# Patient Record
Sex: Male | Born: 1935 | Race: Black or African American | Hispanic: No | Marital: Married | Smoking: Former smoker
Health system: Southern US, Community
[De-identification: ages and names within clinical notes are randomized; demographics above are authoritative.]

## PROBLEM LIST (undated history)

## (undated) ENCOUNTER — Emergency Department (HOSPITAL_COMMUNITY): Payer: Medicare HMO

## (undated) DIAGNOSIS — D649 Anemia, unspecified: Secondary | ICD-10-CM

## (undated) DIAGNOSIS — M1990 Unspecified osteoarthritis, unspecified site: Secondary | ICD-10-CM

## (undated) DIAGNOSIS — Z860100 Personal history of colon polyps, unspecified: Secondary | ICD-10-CM

## (undated) DIAGNOSIS — I10 Essential (primary) hypertension: Secondary | ICD-10-CM

## (undated) DIAGNOSIS — L720 Epidermal cyst: Secondary | ICD-10-CM

## (undated) DIAGNOSIS — N400 Enlarged prostate without lower urinary tract symptoms: Secondary | ICD-10-CM

## (undated) DIAGNOSIS — G4730 Sleep apnea, unspecified: Secondary | ICD-10-CM

## (undated) DIAGNOSIS — J449 Chronic obstructive pulmonary disease, unspecified: Secondary | ICD-10-CM

## (undated) DIAGNOSIS — E785 Hyperlipidemia, unspecified: Secondary | ICD-10-CM

## (undated) DIAGNOSIS — J8410 Pulmonary fibrosis, unspecified: Secondary | ICD-10-CM

## (undated) DIAGNOSIS — Z87442 Personal history of urinary calculi: Secondary | ICD-10-CM

## (undated) DIAGNOSIS — N289 Disorder of kidney and ureter, unspecified: Secondary | ICD-10-CM

## (undated) HISTORY — DX: Hyperlipidemia, unspecified: E78.5

## (undated) HISTORY — DX: Sleep apnea, unspecified: G47.30

## (undated) HISTORY — DX: Unspecified osteoarthritis, unspecified site: M19.90

## (undated) HISTORY — DX: Pulmonary fibrosis, unspecified: J84.10

## (undated) HISTORY — DX: Personal history of colonic polyps: Z86.010

## (undated) HISTORY — PX: TOE SURGERY: SHX1073

## (undated) HISTORY — DX: Personal history of colon polyps, unspecified: Z86.0100

## (undated) HISTORY — DX: Anemia, unspecified: D64.9

## (undated) HISTORY — DX: Benign prostatic hyperplasia without lower urinary tract symptoms: N40.0

## (undated) HISTORY — DX: Chronic obstructive pulmonary disease, unspecified: J44.9

## (undated) HISTORY — DX: Disorder of kidney and ureter, unspecified: N28.9

## (undated) HISTORY — DX: Epidermal cyst: L72.0

## (undated) HISTORY — DX: Essential (primary) hypertension: I10

---

## 1999-04-16 ENCOUNTER — Encounter: Admission: RE | Admit: 1999-04-16 | Discharge: 1999-04-16 | Payer: Self-pay | Admitting: Family Medicine

## 1999-04-16 ENCOUNTER — Encounter: Payer: Self-pay | Admitting: Family Medicine

## 1999-05-15 ENCOUNTER — Ambulatory Visit (HOSPITAL_COMMUNITY): Admission: RE | Admit: 1999-05-15 | Discharge: 1999-05-15 | Payer: Self-pay | Admitting: *Deleted

## 2000-05-18 ENCOUNTER — Encounter: Admission: RE | Admit: 2000-05-18 | Discharge: 2000-05-18 | Payer: Self-pay | Admitting: Family Medicine

## 2000-05-18 ENCOUNTER — Encounter: Payer: Self-pay | Admitting: Family Medicine

## 2001-11-24 ENCOUNTER — Encounter: Admission: RE | Admit: 2001-11-24 | Discharge: 2002-02-22 | Payer: Self-pay | Admitting: Family Medicine

## 2002-02-02 ENCOUNTER — Encounter: Admission: RE | Admit: 2002-02-02 | Discharge: 2002-05-03 | Payer: Self-pay | Admitting: Family Medicine

## 2002-10-07 ENCOUNTER — Encounter: Admission: RE | Admit: 2002-10-07 | Discharge: 2002-10-07 | Payer: Self-pay | Admitting: Family Medicine

## 2002-10-07 ENCOUNTER — Encounter: Payer: Self-pay | Admitting: Family Medicine

## 2002-12-08 ENCOUNTER — Encounter: Admission: RE | Admit: 2002-12-08 | Discharge: 2002-12-08 | Payer: Self-pay | Admitting: Neurosurgery

## 2003-08-04 ENCOUNTER — Ambulatory Visit (HOSPITAL_COMMUNITY): Admission: RE | Admit: 2003-08-04 | Discharge: 2003-08-04 | Payer: Self-pay | Admitting: Gastroenterology

## 2003-09-13 ENCOUNTER — Encounter (INDEPENDENT_AMBULATORY_CARE_PROVIDER_SITE_OTHER): Payer: Self-pay | Admitting: *Deleted

## 2003-09-13 ENCOUNTER — Ambulatory Visit (HOSPITAL_COMMUNITY): Admission: RE | Admit: 2003-09-13 | Discharge: 2003-09-13 | Payer: Self-pay | Admitting: Gastroenterology

## 2004-05-10 ENCOUNTER — Encounter: Admission: RE | Admit: 2004-05-10 | Discharge: 2004-05-10 | Payer: Self-pay | Admitting: Family Medicine

## 2004-06-12 ENCOUNTER — Ambulatory Visit (HOSPITAL_COMMUNITY): Admission: RE | Admit: 2004-06-12 | Discharge: 2004-06-12 | Payer: Self-pay | Admitting: *Deleted

## 2005-10-31 ENCOUNTER — Ambulatory Visit: Payer: Self-pay | Admitting: Family Medicine

## 2005-12-29 ENCOUNTER — Ambulatory Visit: Payer: Self-pay | Admitting: Family Medicine

## 2006-02-02 ENCOUNTER — Ambulatory Visit: Payer: Self-pay | Admitting: Family Medicine

## 2006-02-02 LAB — CONVERTED CEMR LAB
BUN: 15 mg/dL (ref 6–23)
CO2: 29 meq/L (ref 19–32)
Calcium: 9.6 mg/dL (ref 8.4–10.5)
Chloride: 104 meq/L (ref 96–112)
Creatinine, Ser: 1.3 mg/dL (ref 0.4–1.5)
GFR calc non Af Amer: 58 mL/min
Glomerular Filtration Rate, Af Am: 70 mL/min/{1.73_m2}
Glucose, Bld: 135 mg/dL — ABNORMAL HIGH (ref 70–99)
Potassium: 3.7 meq/L (ref 3.5–5.1)
Sodium: 141 meq/L (ref 135–145)

## 2006-03-03 ENCOUNTER — Ambulatory Visit: Payer: Self-pay | Admitting: Family Medicine

## 2006-03-03 LAB — CONVERTED CEMR LAB
ALT: 32 units/L (ref 0–40)
AST: 33 units/L (ref 0–37)
BUN: 13 mg/dL (ref 6–23)
CO2: 30 meq/L (ref 19–32)
Calcium: 9.8 mg/dL (ref 8.4–10.5)
Chloride: 102 meq/L (ref 96–112)
Chol/HDL Ratio, serum: 4.3
Cholesterol: 165 mg/dL (ref 0–200)
Creatinine, Ser: 1.3 mg/dL (ref 0.4–1.5)
Creatinine,U: 101.4 mg/dL
GFR calc non Af Amer: 58 mL/min
Glomerular Filtration Rate, Af Am: 70 mL/min/{1.73_m2}
Glucose, Bld: 151 mg/dL — ABNORMAL HIGH (ref 70–99)
HDL: 38.6 mg/dL — ABNORMAL LOW (ref >39.0)
Hgb A1c MFr Bld: 8.5 % — ABNORMAL HIGH (ref 4.6–6.0)
LDL Cholesterol: 102 mg/dL — ABNORMAL HIGH (ref 0–99)
Microalb Creat Ratio: 45.4 mg/g — ABNORMAL HIGH (ref 0.0–30.0)
Microalb, Ur: 4.6 mg/dL — ABNORMAL HIGH (ref 0.0–1.9)
Potassium: 4.3 meq/L (ref 3.5–5.1)
Sodium: 140 meq/L (ref 135–145)
Triglyceride fasting, serum: 122 mg/dL (ref 0–149)
VLDL: 24 mg/dL (ref 0–40)

## 2006-04-29 DIAGNOSIS — J439 Emphysema, unspecified: Secondary | ICD-10-CM | POA: Insufficient documentation

## 2006-04-29 DIAGNOSIS — G4730 Sleep apnea, unspecified: Secondary | ICD-10-CM | POA: Insufficient documentation

## 2006-04-29 DIAGNOSIS — I10 Essential (primary) hypertension: Secondary | ICD-10-CM | POA: Insufficient documentation

## 2006-04-29 DIAGNOSIS — E119 Type 2 diabetes mellitus without complications: Secondary | ICD-10-CM | POA: Insufficient documentation

## 2006-04-29 DIAGNOSIS — E1159 Type 2 diabetes mellitus with other circulatory complications: Secondary | ICD-10-CM | POA: Insufficient documentation

## 2006-04-29 DIAGNOSIS — M109 Gout, unspecified: Secondary | ICD-10-CM | POA: Insufficient documentation

## 2006-06-03 ENCOUNTER — Ambulatory Visit: Payer: Self-pay | Admitting: Family Medicine

## 2006-06-03 LAB — CONVERTED CEMR LAB
ALT: 31 units/L (ref 0–40)
AST: 30 units/L (ref 0–37)
Albumin: 4.1 g/dL (ref 3.5–5.2)
Alkaline Phosphatase: 62 units/L (ref 39–117)
BUN: 13 mg/dL (ref 6–23)
Bilirubin, Direct: 0.1 mg/dL (ref 0.0–0.3)
CO2: 29 meq/L (ref 19–32)
Calcium: 9.7 mg/dL (ref 8.4–10.5)
Chloride: 104 meq/L (ref 96–112)
Cholesterol: 251 mg/dL (ref 0–200)
Creatinine, Ser: 1.2 mg/dL (ref 0.4–1.5)
Direct LDL: 180.5 mg/dL
GFR calc Af Amer: 77 mL/min
GFR calc non Af Amer: 64 mL/min
Glucose, Bld: 151 mg/dL — ABNORMAL HIGH (ref 70–99)
HDL: 40.6 mg/dL (ref >39.0)
Potassium: 3.9 meq/L (ref 3.5–5.1)
Sodium: 141 meq/L (ref 135–145)
Total Bilirubin: 1 mg/dL (ref 0.3–1.2)
Total CHOL/HDL Ratio: 6.2
Total Protein: 6.9 g/dL (ref 6.0–8.3)
Triglycerides: 240 mg/dL (ref 0–149)
VLDL: 48 mg/dL — ABNORMAL HIGH (ref 0–40)

## 2006-07-28 ENCOUNTER — Ambulatory Visit: Payer: Self-pay | Admitting: Family Medicine

## 2006-07-28 LAB — CONVERTED CEMR LAB
ALT: 32 units/L (ref 0–40)
AST: 30 units/L (ref 0–37)
BUN: 12 mg/dL (ref 6–23)
CO2: 31 meq/L (ref 19–32)
Calcium: 9.6 mg/dL (ref 8.4–10.5)
Chloride: 103 meq/L (ref 96–112)
Cholesterol: 136 mg/dL (ref 0–200)
Creatinine, Ser: 1 mg/dL (ref 0.4–1.5)
GFR calc Af Amer: 95 mL/min
GFR calc non Af Amer: 79 mL/min
Glucose, Bld: 220 mg/dL — ABNORMAL HIGH (ref 70–99)
HDL: 35.6 mg/dL — ABNORMAL LOW (ref >39.0)
LDL Cholesterol: 71 mg/dL (ref 0–99)
Potassium: 4.2 meq/L (ref 3.5–5.1)
Sodium: 139 meq/L (ref 135–145)
Total CHOL/HDL Ratio: 3.8
Triglycerides: 145 mg/dL (ref 0–149)
VLDL: 29 mg/dL (ref 0–40)

## 2006-09-30 ENCOUNTER — Telehealth (INDEPENDENT_AMBULATORY_CARE_PROVIDER_SITE_OTHER): Payer: Self-pay | Admitting: *Deleted

## 2006-10-06 ENCOUNTER — Telehealth (INDEPENDENT_AMBULATORY_CARE_PROVIDER_SITE_OTHER): Payer: Self-pay | Admitting: *Deleted

## 2006-10-06 ENCOUNTER — Ambulatory Visit: Payer: Self-pay | Admitting: Family Medicine

## 2006-10-06 DIAGNOSIS — E1169 Type 2 diabetes mellitus with other specified complication: Secondary | ICD-10-CM | POA: Insufficient documentation

## 2006-10-06 DIAGNOSIS — E785 Hyperlipidemia, unspecified: Secondary | ICD-10-CM | POA: Insufficient documentation

## 2006-10-06 LAB — CONVERTED CEMR LAB
Bilirubin Urine: NEGATIVE
Blood in Urine, dipstick: NEGATIVE
Glucose, Urine, Semiquant: >=1000
Ketones, urine, test strip: NEGATIVE
Nitrite: NEGATIVE
Protein, U semiquant: NEGATIVE
Specific Gravity, Urine: 1.015
Urobilinogen, UA: 0.2
WBC Urine, dipstick: NEGATIVE
pH: 5

## 2006-10-07 LAB — CONVERTED CEMR LAB
ALT: 26 units/L (ref 0–53)
AST: 28 units/L (ref 0–37)
BUN: 19 mg/dL (ref 6–23)
CO2: 30 meq/L (ref 19–32)
Calcium: 9.6 mg/dL (ref 8.4–10.5)
Chloride: 107 meq/L (ref 96–112)
Cholesterol: 106 mg/dL (ref 0–200)
Creatinine, Ser: 1.2 mg/dL (ref 0.4–1.5)
GFR calc Af Amer: 77 mL/min
GFR calc non Af Amer: 64 mL/min
Glucose, Bld: 342 mg/dL — ABNORMAL HIGH (ref 70–99)
HDL: 28.9 mg/dL — ABNORMAL LOW (ref >39.0)
Hgb A1c MFr Bld: 15 % — ABNORMAL HIGH (ref 4.6–6.0)
LDL Cholesterol: 43 mg/dL (ref 0–99)
Potassium: 4.1 meq/L (ref 3.5–5.1)
Sodium: 138 meq/L (ref 135–145)
Total CHOL/HDL Ratio: 3.7
Triglycerides: 173 mg/dL — ABNORMAL HIGH (ref 0–149)
VLDL: 35 mg/dL (ref 0–40)

## 2006-10-15 ENCOUNTER — Encounter (INDEPENDENT_AMBULATORY_CARE_PROVIDER_SITE_OTHER): Payer: Self-pay | Admitting: Family Medicine

## 2006-10-16 ENCOUNTER — Telehealth (INDEPENDENT_AMBULATORY_CARE_PROVIDER_SITE_OTHER): Payer: Self-pay | Admitting: *Deleted

## 2006-10-23 ENCOUNTER — Encounter (INDEPENDENT_AMBULATORY_CARE_PROVIDER_SITE_OTHER): Payer: Self-pay | Admitting: Family Medicine

## 2006-11-27 ENCOUNTER — Ambulatory Visit: Payer: Self-pay | Admitting: Family Medicine

## 2006-11-29 LAB — CONVERTED CEMR LAB
BUN: 11 mg/dL (ref 6–23)
CO2: 31 meq/L (ref 19–32)
Calcium: 9.7 mg/dL (ref 8.4–10.5)
Chloride: 101 meq/L (ref 96–112)
Creatinine, Ser: 1.1 mg/dL (ref 0.4–1.5)
GFR calc Af Amer: 85 mL/min
GFR calc non Af Amer: 70 mL/min
Glucose, Bld: 125 mg/dL — ABNORMAL HIGH (ref 70–99)
PSA: 0.62 ng/mL (ref 0.10–4.00)
Potassium: 4.1 meq/L (ref 3.5–5.1)
Sodium: 139 meq/L (ref 135–145)

## 2006-11-30 ENCOUNTER — Telehealth (INDEPENDENT_AMBULATORY_CARE_PROVIDER_SITE_OTHER): Payer: Self-pay | Admitting: *Deleted

## 2006-12-17 ENCOUNTER — Encounter (INDEPENDENT_AMBULATORY_CARE_PROVIDER_SITE_OTHER): Payer: Self-pay | Admitting: Family Medicine

## 2007-01-13 ENCOUNTER — Ambulatory Visit: Payer: Self-pay | Admitting: Family Medicine

## 2007-01-14 ENCOUNTER — Encounter (INDEPENDENT_AMBULATORY_CARE_PROVIDER_SITE_OTHER): Payer: Self-pay | Admitting: *Deleted

## 2007-01-14 LAB — CONVERTED CEMR LAB
BUN: 10 mg/dL (ref 6–23)
CO2: 31 meq/L (ref 19–32)
Calcium: 9.7 mg/dL (ref 8.4–10.5)
Chloride: 104 meq/L (ref 96–112)
Creatinine, Ser: 1.3 mg/dL (ref 0.4–1.5)
GFR calc Af Amer: 70 mL/min
GFR calc non Af Amer: 58 mL/min
Glucose, Bld: 112 mg/dL — ABNORMAL HIGH (ref 70–99)
Potassium: 4.5 meq/L (ref 3.5–5.1)
Sodium: 142 meq/L (ref 135–145)

## 2007-02-17 ENCOUNTER — Ambulatory Visit: Payer: Self-pay | Admitting: Family Medicine

## 2007-02-19 ENCOUNTER — Telehealth (INDEPENDENT_AMBULATORY_CARE_PROVIDER_SITE_OTHER): Payer: Self-pay | Admitting: *Deleted

## 2007-02-19 ENCOUNTER — Encounter (INDEPENDENT_AMBULATORY_CARE_PROVIDER_SITE_OTHER): Payer: Self-pay | Admitting: *Deleted

## 2007-02-19 LAB — CONVERTED CEMR LAB
BUN: 14 mg/dL (ref 6–23)
CO2: 32 meq/L (ref 19–32)
Calcium: 9.9 mg/dL (ref 8.4–10.5)
Chloride: 103 meq/L (ref 96–112)
Creatinine, Ser: 1.3 mg/dL (ref 0.4–1.5)
GFR calc Af Amer: 70 mL/min
GFR calc non Af Amer: 58 mL/min
Glucose, Bld: 138 mg/dL — ABNORMAL HIGH (ref 70–99)
Potassium: 4.2 meq/L (ref 3.5–5.1)
Sodium: 141 meq/L (ref 135–145)

## 2007-04-21 ENCOUNTER — Encounter: Payer: Self-pay | Admitting: Internal Medicine

## 2007-05-12 ENCOUNTER — Telehealth (INDEPENDENT_AMBULATORY_CARE_PROVIDER_SITE_OTHER): Payer: Self-pay | Admitting: *Deleted

## 2007-06-01 ENCOUNTER — Telehealth (INDEPENDENT_AMBULATORY_CARE_PROVIDER_SITE_OTHER): Payer: Self-pay | Admitting: *Deleted

## 2007-06-04 ENCOUNTER — Telehealth (INDEPENDENT_AMBULATORY_CARE_PROVIDER_SITE_OTHER): Payer: Self-pay | Admitting: *Deleted

## 2007-06-04 ENCOUNTER — Encounter (INDEPENDENT_AMBULATORY_CARE_PROVIDER_SITE_OTHER): Payer: Self-pay | Admitting: Family Medicine

## 2007-06-30 ENCOUNTER — Ambulatory Visit: Payer: Self-pay | Admitting: Internal Medicine

## 2007-09-01 ENCOUNTER — Ambulatory Visit: Payer: Self-pay | Admitting: Internal Medicine

## 2007-09-13 ENCOUNTER — Telehealth (INDEPENDENT_AMBULATORY_CARE_PROVIDER_SITE_OTHER): Payer: Self-pay | Admitting: *Deleted

## 2007-10-11 ENCOUNTER — Telehealth: Payer: Self-pay | Admitting: Internal Medicine

## 2007-12-23 ENCOUNTER — Encounter: Payer: Self-pay | Admitting: Internal Medicine

## 2007-12-23 LAB — CONVERTED CEMR LAB
ALT: 22 units/L
BUN: 21 mg/dL
CO2, serum: 27 mmol/L
Calcium: 9.7 mg/dL
Chloride, Serum: 104 mmol/L
Creatinine, Ser: 1.4 mg/dL
Glucose, Bld: 135 mg/dL
Potassium, serum: 5.2 mmol/L
Sodium, serum: 138 mmol/L

## 2007-12-28 ENCOUNTER — Encounter: Payer: Self-pay | Admitting: Internal Medicine

## 2008-01-18 ENCOUNTER — Ambulatory Visit: Payer: Self-pay | Admitting: Internal Medicine

## 2008-02-07 ENCOUNTER — Telehealth: Payer: Self-pay | Admitting: Internal Medicine

## 2008-02-15 ENCOUNTER — Ambulatory Visit: Payer: Self-pay | Admitting: Internal Medicine

## 2008-02-17 ENCOUNTER — Ambulatory Visit: Payer: Self-pay | Admitting: Internal Medicine

## 2008-02-17 LAB — CONVERTED CEMR LAB
BUN: 23 mg/dL (ref 6–23)
CO2: 26 meq/L (ref 19–32)
Calcium: 9.4 mg/dL (ref 8.4–10.5)
Chloride: 107 meq/L (ref 96–112)
Creatinine, Ser: 1.6 mg/dL — ABNORMAL HIGH (ref 0.4–1.5)
GFR calc Af Amer: 55 mL/min
GFR calc non Af Amer: 46 mL/min
Glucose, Bld: 98 mg/dL (ref 70–99)
Potassium: 4.2 meq/L (ref 3.5–5.1)
Sodium: 141 meq/L (ref 135–145)

## 2008-02-18 ENCOUNTER — Ambulatory Visit: Payer: Self-pay | Admitting: Internal Medicine

## 2008-02-22 ENCOUNTER — Telehealth (INDEPENDENT_AMBULATORY_CARE_PROVIDER_SITE_OTHER): Payer: Self-pay | Admitting: *Deleted

## 2008-03-09 ENCOUNTER — Ambulatory Visit: Payer: Self-pay | Admitting: Internal Medicine

## 2008-03-09 DIAGNOSIS — K3189 Other diseases of stomach and duodenum: Secondary | ICD-10-CM | POA: Insufficient documentation

## 2008-03-09 DIAGNOSIS — R1013 Epigastric pain: Secondary | ICD-10-CM | POA: Insufficient documentation

## 2008-03-09 DIAGNOSIS — K5900 Constipation, unspecified: Secondary | ICD-10-CM | POA: Insufficient documentation

## 2008-03-15 ENCOUNTER — Telehealth (INDEPENDENT_AMBULATORY_CARE_PROVIDER_SITE_OTHER): Payer: Self-pay | Admitting: *Deleted

## 2008-03-15 LAB — CONVERTED CEMR LAB
ALT: 57 units/L — ABNORMAL HIGH (ref 0–53)
AST: 62 units/L — ABNORMAL HIGH (ref 0–37)
Albumin: 3.7 g/dL (ref 3.5–5.2)
Alkaline Phosphatase: 76 units/L (ref 39–117)
BUN: 12 mg/dL (ref 6–23)
Basophils Absolute: 0 10*3/uL (ref 0.0–0.1)
Basophils Relative: 0.5 % (ref 0.0–3.0)
Bilirubin, Direct: 0.1 mg/dL (ref 0.0–0.3)
CO2: 27 meq/L (ref 19–32)
Calcium: 9.1 mg/dL (ref 8.4–10.5)
Chloride: 108 meq/L (ref 96–112)
Creatinine, Ser: 1.3 mg/dL (ref 0.4–1.5)
Eosinophils Absolute: 0.1 10*3/uL (ref 0.0–0.7)
Eosinophils Relative: 2.3 % (ref 0.0–5.0)
GFR calc Af Amer: 70 mL/min
GFR calc non Af Amer: 58 mL/min
Glucose, Bld: 90 mg/dL (ref 70–99)
HCT: 41.3 % (ref 39.0–52.0)
Hemoglobin: 14.3 g/dL (ref 13.0–17.0)
Lymphocytes Relative: 28.4 % (ref 12.0–46.0)
MCHC: 34.5 g/dL (ref 30.0–36.0)
MCV: 88.8 fL (ref 78.0–100.0)
Monocytes Absolute: 0.6 10*3/uL (ref 0.1–1.0)
Monocytes Relative: 10.5 % (ref 3.0–12.0)
Neutro Abs: 3.2 10*3/uL (ref 1.4–7.7)
Neutrophils Relative %: 58.3 % (ref 43.0–77.0)
Platelets: 220 10*3/uL (ref 150–400)
Potassium: 3.8 meq/L (ref 3.5–5.1)
RBC: 4.65 M/uL (ref 4.22–5.81)
RDW: 13 % (ref 11.5–14.6)
Sodium: 143 meq/L (ref 135–145)
TSH: 2.47 microintl units/mL (ref 0.35–5.50)
Total Bilirubin: 0.8 mg/dL (ref 0.3–1.2)
Total Protein: 6.7 g/dL (ref 6.0–8.3)
WBC: 5.4 10*3/uL (ref 4.5–10.5)

## 2008-04-18 ENCOUNTER — Ambulatory Visit: Payer: Self-pay | Admitting: Internal Medicine

## 2008-04-18 DIAGNOSIS — M1990 Unspecified osteoarthritis, unspecified site: Secondary | ICD-10-CM | POA: Insufficient documentation

## 2008-04-18 LAB — CONVERTED CEMR LAB: Anti Nuclear Antibody(ANA): NEGATIVE

## 2008-04-21 ENCOUNTER — Telehealth: Payer: Self-pay | Admitting: Internal Medicine

## 2008-04-21 LAB — CONVERTED CEMR LAB
ALT: 26 units/L (ref 0–53)
AST: 29 units/L (ref 0–37)
Albumin: 4 g/dL (ref 3.5–5.2)
Alkaline Phosphatase: 69 units/L (ref 39–117)
Bilirubin, Direct: 0.1 mg/dL (ref 0.0–0.3)
Creatinine,U: 111.5 mg/dL
Microalb Creat Ratio: 48.4 mg/g — ABNORMAL HIGH (ref 0.0–30.0)
Microalb, Ur: 5.4 mg/dL — ABNORMAL HIGH (ref 0.0–1.9)
Rhuematoid fact SerPl-aCnc: <20 intl units/mL — ABNORMAL LOW (ref 0.0–20.0)
Sed Rate: 20 mm/hr — ABNORMAL HIGH (ref 0–16)
Total Bilirubin: 0.8 mg/dL (ref 0.3–1.2)
Total Protein: 7.3 g/dL (ref 6.0–8.3)

## 2008-05-24 ENCOUNTER — Ambulatory Visit: Payer: Self-pay | Admitting: Internal Medicine

## 2008-05-31 ENCOUNTER — Ambulatory Visit: Payer: Self-pay | Admitting: Internal Medicine

## 2008-06-06 ENCOUNTER — Telehealth (INDEPENDENT_AMBULATORY_CARE_PROVIDER_SITE_OTHER): Payer: Self-pay | Admitting: *Deleted

## 2008-06-06 LAB — CONVERTED CEMR LAB
ALT: 18 units/L (ref 0–53)
AST: 23 units/L (ref 0–37)
Cholesterol: 260 mg/dL (ref 0–200)
Direct LDL: 184.5 mg/dL
HDL: 38.3 mg/dL — ABNORMAL LOW (ref >39.0)
Total CHOL/HDL Ratio: 6.8
Triglycerides: 162 mg/dL — ABNORMAL HIGH (ref 0–149)
VLDL: 32 mg/dL (ref 0–40)

## 2008-07-11 ENCOUNTER — Ambulatory Visit: Payer: Self-pay | Admitting: Internal Medicine

## 2008-07-11 DIAGNOSIS — K921 Melena: Secondary | ICD-10-CM | POA: Insufficient documentation

## 2008-07-11 DIAGNOSIS — N400 Enlarged prostate without lower urinary tract symptoms: Secondary | ICD-10-CM | POA: Insufficient documentation

## 2008-07-12 ENCOUNTER — Encounter: Payer: Self-pay | Admitting: Internal Medicine

## 2008-07-13 ENCOUNTER — Encounter (INDEPENDENT_AMBULATORY_CARE_PROVIDER_SITE_OTHER): Payer: Self-pay | Admitting: *Deleted

## 2008-07-13 LAB — CONVERTED CEMR LAB
ALT: 33 units/L (ref 0–53)
AST: 31 units/L (ref 0–37)
Cholesterol: 139 mg/dL (ref 0–200)
HDL: 34.8 mg/dL — ABNORMAL LOW (ref >39.00)
LDL Cholesterol: 77 mg/dL (ref 0–99)
PSA: 0.67 ng/mL (ref 0.10–4.00)
Total CHOL/HDL Ratio: 4
Triglycerides: 137 mg/dL (ref 0.0–149.0)
VLDL: 27.4 mg/dL (ref 0.0–40.0)

## 2008-07-18 ENCOUNTER — Encounter: Payer: Self-pay | Admitting: Internal Medicine

## 2008-07-20 ENCOUNTER — Encounter: Payer: Self-pay | Admitting: Internal Medicine

## 2009-01-10 ENCOUNTER — Ambulatory Visit: Payer: Self-pay | Admitting: Internal Medicine

## 2009-01-24 ENCOUNTER — Ambulatory Visit: Payer: Self-pay | Admitting: Internal Medicine

## 2009-01-31 ENCOUNTER — Ambulatory Visit: Payer: Self-pay | Admitting: Internal Medicine

## 2009-01-31 ENCOUNTER — Encounter: Admission: RE | Admit: 2009-01-31 | Discharge: 2009-04-04 | Payer: Self-pay | Admitting: Internal Medicine

## 2009-02-02 LAB — CONVERTED CEMR LAB
BUN: 12 mg/dL (ref 6–23)
CO2: 29 meq/L (ref 19–32)
Calcium: 9.6 mg/dL (ref 8.4–10.5)
Chloride: 102 meq/L (ref 96–112)
Creatinine, Ser: 1.2 mg/dL (ref 0.4–1.5)
GFR calc non Af Amer: 76.47 mL/min (ref >60)
Glucose, Bld: 123 mg/dL — ABNORMAL HIGH (ref 70–99)
Potassium: 4 meq/L (ref 3.5–5.1)
Sodium: 141 meq/L (ref 135–145)

## 2009-04-10 ENCOUNTER — Telehealth: Payer: Self-pay | Admitting: Internal Medicine

## 2009-05-01 ENCOUNTER — Telehealth (INDEPENDENT_AMBULATORY_CARE_PROVIDER_SITE_OTHER): Payer: Self-pay | Admitting: *Deleted

## 2009-05-03 ENCOUNTER — Telehealth (INDEPENDENT_AMBULATORY_CARE_PROVIDER_SITE_OTHER): Payer: Self-pay | Admitting: *Deleted

## 2009-05-17 ENCOUNTER — Telehealth (INDEPENDENT_AMBULATORY_CARE_PROVIDER_SITE_OTHER): Payer: Self-pay | Admitting: *Deleted

## 2009-05-21 ENCOUNTER — Encounter: Payer: Self-pay | Admitting: Internal Medicine

## 2009-05-28 ENCOUNTER — Ambulatory Visit: Payer: Self-pay | Admitting: Internal Medicine

## 2009-05-28 LAB — CONVERTED CEMR LAB
ALT: 34 units/L (ref 0–53)
AST: 34 units/L (ref 0–37)
Albumin: 4.2 g/dL (ref 3.5–5.2)
Alkaline Phosphatase: 66 units/L (ref 39–117)
BUN: 16 mg/dL (ref 6–23)
Bilirubin, Direct: 0.1 mg/dL (ref 0.0–0.3)
CO2: 30 meq/L (ref 19–32)
Calcium: 9.4 mg/dL (ref 8.4–10.5)
Chloride: 107 meq/L (ref 96–112)
Creatinine, Ser: 1.5 mg/dL (ref 0.4–1.5)
GFR calc non Af Amer: 59.06 mL/min (ref >60)
Glucose, Bld: 138 mg/dL — ABNORMAL HIGH (ref 70–99)
Potassium: 4 meq/L (ref 3.5–5.1)
Sodium: 142 meq/L (ref 135–145)
Total Bilirubin: 0.6 mg/dL (ref 0.3–1.2)
Total Protein: 7 g/dL (ref 6.0–8.3)

## 2009-06-07 ENCOUNTER — Encounter: Payer: Self-pay | Admitting: Internal Medicine

## 2009-07-06 LAB — HM COLONOSCOPY

## 2009-07-11 ENCOUNTER — Telehealth (INDEPENDENT_AMBULATORY_CARE_PROVIDER_SITE_OTHER): Payer: Self-pay | Admitting: *Deleted

## 2009-07-25 ENCOUNTER — Encounter: Payer: Self-pay | Admitting: Internal Medicine

## 2009-08-30 ENCOUNTER — Ambulatory Visit: Payer: Self-pay | Admitting: Internal Medicine

## 2009-08-30 ENCOUNTER — Encounter (INDEPENDENT_AMBULATORY_CARE_PROVIDER_SITE_OTHER): Payer: Self-pay | Admitting: *Deleted

## 2009-09-05 ENCOUNTER — Ambulatory Visit: Payer: Self-pay | Admitting: Internal Medicine

## 2009-09-05 DIAGNOSIS — R05 Cough: Secondary | ICD-10-CM

## 2009-09-05 DIAGNOSIS — R059 Cough, unspecified: Secondary | ICD-10-CM | POA: Insufficient documentation

## 2009-09-20 ENCOUNTER — Ambulatory Visit: Payer: Self-pay | Admitting: Internal Medicine

## 2009-09-21 ENCOUNTER — Encounter: Payer: Self-pay | Admitting: Internal Medicine

## 2009-10-10 ENCOUNTER — Ambulatory Visit: Payer: Self-pay | Admitting: Internal Medicine

## 2009-10-11 LAB — CONVERTED CEMR LAB
ALT: 25 units/L (ref 0–53)
AST: 26 units/L (ref 0–37)
BUN: 22 mg/dL (ref 6–23)
CO2: 31 meq/L (ref 19–32)
Calcium: 9.6 mg/dL (ref 8.4–10.5)
Chloride: 106 meq/L (ref 96–112)
Cholesterol: 150 mg/dL (ref 0–200)
Creatinine, Ser: 1.3 mg/dL (ref 0.4–1.5)
GFR calc non Af Amer: 72.81 mL/min (ref >60)
Glucose, Bld: 132 mg/dL — ABNORMAL HIGH (ref 70–99)
HDL: 43.3 mg/dL (ref >39.00)
LDL Cholesterol: 83 mg/dL (ref 0–99)
Potassium: 4.6 meq/L (ref 3.5–5.1)
Sodium: 143 meq/L (ref 135–145)
Total CHOL/HDL Ratio: 3
Triglycerides: 121 mg/dL (ref 0.0–149.0)
VLDL: 24.2 mg/dL (ref 0.0–40.0)

## 2009-11-08 ENCOUNTER — Telehealth: Payer: Self-pay | Admitting: Internal Medicine

## 2009-11-09 ENCOUNTER — Telehealth (INDEPENDENT_AMBULATORY_CARE_PROVIDER_SITE_OTHER): Payer: Self-pay | Admitting: *Deleted

## 2009-12-05 ENCOUNTER — Encounter: Payer: Self-pay | Admitting: Internal Medicine

## 2010-01-11 ENCOUNTER — Ambulatory Visit: Payer: Self-pay | Admitting: Internal Medicine

## 2010-01-16 LAB — CONVERTED CEMR LAB
Basophils Absolute: 0 10*3/uL (ref 0.0–0.1)
Basophils Relative: 0.3 % (ref 0.0–3.0)
Eosinophils Absolute: 0.1 10*3/uL (ref 0.0–0.7)
Eosinophils Relative: 2.2 % (ref 0.0–5.0)
HCT: 42.9 % (ref 39.0–52.0)
Hemoglobin: 14.8 g/dL (ref 13.0–17.0)
Lymphocytes Relative: 26.1 % (ref 12.0–46.0)
Lymphs Abs: 1.6 10*3/uL (ref 0.7–4.0)
MCHC: 34.4 g/dL (ref 30.0–36.0)
MCV: 91.7 fL (ref 78.0–100.0)
Monocytes Absolute: 0.6 10*3/uL (ref 0.1–1.0)
Monocytes Relative: 9.9 % (ref 3.0–12.0)
Neutro Abs: 3.9 10*3/uL (ref 1.4–7.7)
Neutrophils Relative %: 61.5 % (ref 43.0–77.0)
PSA: 0.66 ng/mL (ref 0.10–4.00)
Platelets: 191 10*3/uL (ref 150.0–400.0)
RBC: 4.68 M/uL (ref 4.22–5.81)
RDW: 14 % (ref 11.5–14.6)
TSH: 3.97 microintl units/mL (ref 0.35–5.50)
WBC: 6.3 10*3/uL (ref 4.5–10.5)

## 2010-02-18 ENCOUNTER — Encounter: Payer: Self-pay | Admitting: Internal Medicine

## 2010-03-15 ENCOUNTER — Encounter: Payer: Self-pay | Admitting: Internal Medicine

## 2010-04-28 ENCOUNTER — Encounter: Payer: Self-pay | Admitting: Family Medicine

## 2010-05-05 LAB — CONVERTED CEMR LAB
ALT: 32 units/L
AST: 26 units/L
Albumin: 4.6 g/dL
Alkaline Phosphatase: 83 units/L
BUN: 12 mg/dL
CO2, serum: 26 mmol/L
Calcium: 9.7 mg/dL
Chloride, Serum: 103 mmol/L
Creatinine, Ser: 1.22 mg/dL
Glucose, Bld: 176 mg/dL
HCT: 42 %
Hemoglobin: 14.6 g/dL
MCV: 87.9 fL
Potassium, serum: 4.4 mmol/L
RBC count: 4.78 10*6/uL
Sodium, serum: 142 mmol/L
Total Bilirubin: 0.5 mg/dL
Total Protein: 7.5 g/dL
Uric Acid, Serum: 8.7 mg/dL
WBC, blood: 6.2 10*3/uL

## 2010-05-07 ENCOUNTER — Telehealth: Payer: Self-pay | Admitting: Internal Medicine

## 2010-05-09 NOTE — Letter (Signed)
Summary: Handout Printed  Printed Handout:  - *[facility] Patient Instructions 

## 2010-05-09 NOTE — Progress Notes (Signed)
Summary: meds causing gas - dr Drue Novel  Phone Note Call from Patient Call back at Home Phone [phone]   Caller: Patient Summary of Call: patient has a question about fenofibrate & simvastatin which he has been taking a month- since taking he has had lose bowel movements & gas  Initial call taken by: Okey Regal Spring,  October 11, 2007 11:07 AM  Follow-up for Phone Call        -Patient c/o of loose stools & gas x 2-3 weeks -- he feels it is related to cholesterol medication.  He is taking simvastatin, fenofibrate, & lipitor.   -Lipitor was taken off med list 06/30/07 --- $$$$....it was changed at that office visit to simvastatin.  Refill was ok'd 6/09.  So he continued taking it.  -Advised patient to d/c lipitor -- called pharmacy and advised lipitor has been d/c'd and do not refill. -any suggestions ON:GEXBMW & gas? .............Marland KitchenShary Decamp  October 11, 2007 11:50 AM  agree w/ d/c lipitor hold fenofibrate x 2 weeks, then restart call if no better in 1 week call if sx resurface w/  fenofibrate............Glory Graefe E. Shellee Streng MD  October 11, 2007 1:29 PM   discussed with patient  Follow-up by: Shary Decamp,  October 11, 2007 1:33 PM

## 2010-05-09 NOTE — Assessment & Plan Note (Signed)
Summary: 30 DAY ROA AND FLU SHOT///SPH   Vital Signs:  Patient Profile:   76 Years Old Male Height:     68 inches Weight:      195.4 pounds Temp:     97.9 degrees F oral Pulse rate:   76 / minute Resp:     16 per minute BP sitting:   125 / 78  Vitals Entered By: Ardyth Man (February 17, 2007 9:05 AM)                 Chief Complaint:  needs flu shot  follow up on meds.   bp issues.  History of Present Illness:  Hypertension Follow-Up      This is a 76 year old man who presents for Hypertension follow-up.  Patient doesnt complain of chest pain and chest pressure.  Compliance with medications (by patient report) has been near 100%.  The patient reports that dietary compliance has been good.  Reports he ran out of Caduet and is currently in the Doughnut hole. Same with Zetia  Hyperlipidemia Follow-Up      The patient also presents for Hyperlipidemia follow-up.  Compliance with medications (by patient report) has been near 100%.          Physical Exam  General:     overweight-appearing.   Lungs:     Normal respiratory effort, chest expands symmetrically. Lungs are clear to auscultation, no crackles or wheezes. Heart:     Normal rate and regular rhythm. S1 and S2 normal without gallop, murmur, click, rub or other extra sounds. Extremities:     No clubbing, cyanosis, edema, or deformity noted with normal full range of motion of all joints.      Impression & Recommendations:  Problem # 1:  HYPERTENSION (ICD-401.9)  His updated medication list for this problem includes:    Caduet 10-80 Mg Tabs (Amlodipine-atorvastatin) .Marland Kitchen... 1 by mouth qd    Benazepril-hydrochlorothiazide 20-25 Mg Tabs (Benazepril-hydrochlorothiazide) .Marland Kitchen... 1 by mouth qd  Orders: TLB-BMP (Basic Metabolic Panel-BMET) (80048-METABOL) Venipuncture (16109)   Problem # 2:  HYPERLIPIDEMIA (ICD-272.4) Stable His updated medication list for this problem includes:    Caduet 10-80 Mg Tabs  (Amlodipine-atorvastatin) .Marland Kitchen... 1 by mouth qd    Zetia 10 Mg Tabs (Ezetimibe)  Labs Reviewed: Chol: 106 (10/06/2006)   HDL: 28.9 (10/06/2006)   LDL: 43 (10/06/2006)   TG: 173 (10/06/2006) SGOT: 28 (10/06/2006)   SGPT: 26 (10/06/2006)   Complete Medication List: 1)  Caduet 10-80 Mg Tabs (Amlodipine-atorvastatin) .Marland Kitchen.. 1 by mouth qd 2)  Baby Aspirin 81 Mg Chew (Aspirin) 3)  Benazepril-hydrochlorothiazide 20-25 Mg Tabs (Benazepril-hydrochlorothiazide) .Marland Kitchen.. 1 by mouth qd 4)  Actoplus Met 15-500 Mg Tabs (Pioglitazone hcl-metformin hcl) .... Take one tablet by mouth daily 5)  Zetia 10 Mg Tabs (Ezetimibe) 6)  Colchicine 0.6 Mg Tabs (Colchicine) .... Take one tablet four times daily. 7)  Humalog Mix 75/25 Kwikpen 75-25 % Susp (Insulin lispro prot & lispro)     ]  Influenza Vaccine    Vaccine Type: Fluvax MCR    Site: right deltoid    Mfr: Sanofi Pasteur    Dose: 0.5 ml    Route: IM    Given by: [employer]    Exp. Date: 10/05/2007    Lot #: [id number]  Flu Vaccine Consent Questions    Do you have a history of severe allergic reactions to this vaccine? no    Any prior history of allergic reactions to egg and/or gelatin? no  Do you have a sensitivity to the preservative Thimersol? no    Do you have a past history of Guillan-Barre Syndrome? no    Do you currently have an acute febrile illness? no    Have you ever had a severe reaction to latex? no    Vaccine information given and explained to patient? yes

## 2010-05-09 NOTE — Progress Notes (Signed)
Summary: pt now using mailorder [employer]  Phone Note Call from Patient Call back at [facility] Phone [phone]   Caller: Patient Summary of Call: pt called has new rx plan using [employer] fax [phone] Member # [id number]. WHEN SENDING IN SCRIPTS PLEASE USE Initial call taken by: Kandice Hams,  May 03, 2009 4:35 PM

## 2010-05-09 NOTE — Assessment & Plan Note (Signed)
Summary: nurse visit//ph  Nurse Visit   Impression & Recommendations:  Problem # 1:  HYPERTENSION (ICD-401.9) stable His updated medication list for this problem includes:    Benazepril-hydrochlorothiazide 20-25 Mg Tabs (Benazepril-hydrochlorothiazide) .Marland Kitchen... 1 by mouth qd    Norvasc 10 Mg Tabs (Amlodipine besylate) .Marland Kitchen... Take one tablet daily    Amlodipine Besylate 10 Mg Tabs (Amlodipine besylate) .Marland Kitchen... 1 by mouth once daily  BP today: 130/64 Prior BP: 138/80 (01/18/2008)  Labs Reviewed: Creat: 1.4 (12/23/2007) Chol: 106 (10/06/2006)   HDL: 28.9 (10/06/2006)   LDL: 43 (10/06/2006)   TG: 173 (10/06/2006)   Problem # 2:  HYPERLIPIDEMIA (ICD-272.4) couldn't tolerate fenofibrate due to GI side effects, now or in increased dose of simvastatin  His updated medication list for this problem includes:    Simvastatin 80 Mg Tabs (Simvastatin) .Marland Kitchen... 1 by mouth qhs  Orders: Venipuncture (04540) TLB-BMP (Basic Metabolic Panel-BMET) (80048-METABOL)   Complete Medication List: 1)  Baby Aspirin 81 Mg Chew (Aspirin) 2)  Benazepril-hydrochlorothiazide 20-25 Mg Tabs (Benazepril-hydrochlorothiazide) .Marland Kitchen.. 1 by mouth qd 3)  Actoplus Met 15-500 Mg Tabs (Pioglitazone hcl-metformin hcl) .... Take one tablet by mouth daily 4)  Colchicine 0.6 Mg Tabs (Colchicine) .... Take one tablet four times daily. 5)  Humalog Mix 75/25 Kwikpen 75-25 % Susp (Insulin lispro prot & lispro) .... 30 u in am 15 u in pm 6)  Norvasc 10 Mg Tabs (Amlodipine besylate) .... Take one tablet daily 7)  Mvi  8)  Fish Oil  9)  Simvastatin 80 Mg Tabs (Simvastatin) .Marland Kitchen.. 1 by mouth qhs 10)  Amlodipine Besylate 10 Mg Tabs (Amlodipine besylate) .Marland Kitchen.. 1 by mouth once daily   Vital Signs:  Patient Profile:   76 Years Old Male CC:      bp check -- bmp drawn Height:     68 inches Weight:      195.260 pounds BP sitting:   130 / 64  (right arm) Cuff size:   large  Vitals Entered By: Shary Decamp (February 15, 2008 1:06 PM)                  Prior Medications: BABY ASPIRIN 81 MG  CHEW (ASPIRIN)  BENAZEPRIL-HYDROCHLOROTHIAZIDE 20-25 MG  TABS (BENAZEPRIL-HYDROCHLOROTHIAZIDE) 1 by mouth qd ACTOPLUS MET 15-500 MG  TABS (PIOGLITAZONE HCL-METFORMIN HCL) TAKE ONE TABLET BY MOUTH DAILY COLCHICINE 0.6 MG  TABS (COLCHICINE) Take one tablet four times daily. HUMALOG MIX 75/25 KWIKPEN 75-25 %  SUSP (INSULIN LISPRO PROT & LISPRO) 30 U IN AM 15 U IN PM NORVASC 10 MG  TABS (AMLODIPINE BESYLATE) Take one tablet daily MVI ()  FISH OIL ()  SIMVASTATIN 80 MG  TABS (SIMVASTATIN) 1 by mouth qhs AMLODIPINE BESYLATE 10 MG TABS (AMLODIPINE BESYLATE) 1 by mouth once daily Current Allergies: No known allergies     Orders Added: 1)  Venipuncture [36415] 2)  TLB-BMP (Basic Metabolic Panel-BMET) [80048-METABOL] 3)  Est. Patient Level I [email or web address]    ]

## 2010-05-09 NOTE — Progress Notes (Signed)
  Phone Note Outgoing Call   Call placed by: Ardyth Man,  October 06, 2006 11:19 AM Call placed to: Patient Summary of Call: Pt. aware and I am waiting on labs to come back to fax to Dr. Oneita Kras office per Dr. Blossom Hoops.  ...................................................................Ardyth Man  October 06, 2006 11:20 AM   Follow-up for Phone Call        Labs faxed to Dr. Oneita Kras office ...................................................................Ardyth Man  October 07, 2006 10:43 AM  Follow-up by: Ardyth Man,  October 07, 2006 10:43 AM

## 2010-05-09 NOTE — Assessment & Plan Note (Signed)
Summary: 3 month roa.cbs   Vital Signs:  Patient Profile:   76 Years Old Male Height:     68 inches Weight:      189.6 pounds Pulse rate:   64 / minute BP sitting:   142 / 80  Vitals Entered By: Shary Decamp (June 30, 2007 10:45 AM)             Is Patient Diabetic? Yes  Comments -patient is concerned about the high cost of his medications wants to know how he can reduce this -patient wants to know why he is on amlodipine & benazepril -insurance would like to change zetia to lovastatn, pravastatin, or simvastatin -wants to know if cinammon, acai berry, & flax seed would work for his cholesterol -FBS this am was 120 ..................................................................Marland KitchenShary Decamp  June 30, 2007 10:51 AM       Chief Complaint:  ROV - FASTING.  History of Present Illness: ROV -patient is concerned about the high cost of his medications wants to know how he can reduce this -patient wants to know why he is on amlodipine & benazepril -insurance would like to change zetia to lovastatn, pravastatin, or simvastatin -wants to know if cinammon, acai berry, & flax seed would work for his cholesterol -FBS this am was 120 COPD--now Asx Diabetes mellitus--was refered to Dr Roanna Raider, next appointment by May Hypertension--amb BP <150 but mostly in the 130s Hyperlipidemia--cost of meds SLEEP APNEA -- occ uses CPAP    Updated Prior Medication List: BABY ASPIRIN 81 MG  CHEW (ASPIRIN)  BENAZEPRIL-HYDROCHLOROTHIAZIDE 20-25 MG  TABS (BENAZEPRIL-HYDROCHLOROTHIAZIDE) 1 by mouth qd ACTOPLUS MET 15-500 MG  TABS (PIOGLITAZONE HCL-METFORMIN HCL) TAKE ONE TABLET BY MOUTH DAILY ZETIA 10 MG  TABS (EZETIMIBE) Take one tablet daily COLCHICINE 0.6 MG  TABS (COLCHICINE) Take one tablet four times daily. HUMALOG MIX 75/25 KWIKPEN 75-25 %  SUSP (INSULIN LISPRO PROT & LISPRO) 30 U IN AM 15 U IN PM NORVASC 10 MG  TABS (AMLODIPINE BESYLATE) Take one tablet daily LIPITOR 80 MG  TABS  (ATORVASTATIN CALCIUM) Take one tablet daily * MVI  * FISH OIL   Current Allergies (reviewed today): No known allergies   Past Medical History:    COPD    Diabetes mellitus, type II    Gout    Hypertension    Hyperlipidemia    SLEEP APNEA       Past Surgical History:    Reviewed history from 11/27/2006 and no changes required:       toe surgery   Family History:    Reviewed history from 11/27/2006 and no changes required:       Family History Diabetes 1st degree relative       Family History Liver disease       Family History of CAD Male 1st degree relative <50  Social History:    Married    Former Smoker    Alcohol use-no    Drug use-no    Regular exercise-yes   Risk Factors: Tobacco use:  quit Drug use:  no Alcohol use:  no Exercise:  yes  Colonoscopy History:    Date of Last Colonoscopy:  09/13/2003   Review of Systems  CV      Denies chest pain or discomfort and swelling of feet.  Resp      Denies cough and shortness of breath.   Physical Exam  General:     alert and well-developed.   Lungs:     normal respiratory effort, no  intercostal retractions, no accessory muscle use, and normal breath sounds.   Heart:     normal rate, regular rhythm, and no murmur.   Extremities:     no edema    Impression & Recommendations:  Problem # 1:  multiple issues d/w pt we went trough his med list explained why he needs to take the amount of meds he is on we discuss diet-exercise as a way to improve his health and possibly reduce the amount of medicines apparently heis doing already the best he can s/p nutritionist counseling exercises daily F2F >> 25 min  Problem # 2:  HYPERLIPIDEMIA (ICD-272.4) switch to simvastatin samples of Zetia The following medications were removed from the medication list:    Lipitor 80 Mg Tabs (Atorvastatin calcium) .Marland Kitchen... Take one tablet daily  His updated medication list for this problem includes:    Zetia 10 Mg Tabs  (Ezetimibe) .Marland Kitchen... Take one tablet daily    Simvastatin 80 Mg Tabs (Simvastatin) .Marland Kitchen... 1 by mouth at bedtime   Problem # 3:  HYPERTENSION (ICD-401.9) no change for now His updated medication list for this problem includes:    Benazepril-hydrochlorothiazide 20-25 Mg Tabs (Benazepril-hydrochlorothiazide) .Marland Kitchen... 1 by mouth qd    Norvasc 10 Mg Tabs (Amlodipine besylate) .Marland Kitchen... Take one tablet daily  BP today: 142/80 Prior BP: 125/78 (02/17/2007)  Labs Reviewed: Creat: 1.3 (02/17/2007) Chol: 106 (10/06/2006)   HDL: 28.9 (10/06/2006)   LDL: 43 (10/06/2006)   TG: 173 (10/06/2006)   Problem # 4:  DIABETES MELLITUS, TYPE II (ICD-250.00) f/u by Dr Roanna Raider will request her notes His updated medication list for this problem includes:    Baby Aspirin 81 Mg Chew (Aspirin)    Benazepril-hydrochlorothiazide 20-25 Mg Tabs (Benazepril-hydrochlorothiazide) .Marland Kitchen... 1 by mouth qd    Actoplus Met 15-500 Mg Tabs (Pioglitazone hcl-metformin hcl) .Marland Kitchen... Take one tablet by mouth daily    Humalog Mix 75/25 Kwikpen 75-25 % Susp (Insulin lispro prot & lispro) .Marland KitchenMarland KitchenMarland KitchenMarland Kitchen 30 u in am 15 u in pm   Complete Medication List: 1)  Baby Aspirin 81 Mg Chew (Aspirin) 2)  Benazepril-hydrochlorothiazide 20-25 Mg Tabs (Benazepril-hydrochlorothiazide) .Marland Kitchen.. 1 by mouth qd 3)  Actoplus Met 15-500 Mg Tabs (Pioglitazone hcl-metformin hcl) .... Take one tablet by mouth daily 4)  Zetia 10 Mg Tabs (Ezetimibe) .... Take one tablet daily 5)  Colchicine 0.6 Mg Tabs (Colchicine) .... Take one tablet four times daily. 6)  Humalog Mix 75/25 Kwikpen 75-25 % Susp (Insulin lispro prot & lispro) .... 30 u in am 15 u in pm 7)  Norvasc 10 Mg Tabs (Amlodipine besylate) .... Take one tablet daily 8)  Mvi  9)  Fish Oil  10)  Simvastatin 80 Mg Tabs (Simvastatin) .Marland Kitchen.. 1 by mouth at bedtime   Patient Instructions: 1)  Please schedule a follow-up appointment in 2 months.    Prescriptions: SIMVASTATIN 80 MG  TABS (SIMVASTATIN) 1 by mouth at bedtime  #90  x 1   Entered and Authorized by:   Elita Quick E. Tien Aispuro MD   Signed by:   Nolon Rod. Keanen Dohse MD on 06/30/2007   Method used:   Print then Give to Patient   RxID:   [id number]  ]  Appended Document: 3 month roa.cbs FAXED TO DR Roanna Raider

## 2010-05-09 NOTE — Assessment & Plan Note (Signed)
Summary: 3 weeks/ov//ph   Vital Signs:  Patient Profile:   76 Years Old Male Height:     68 inches Weight:      192.2 pounds Pulse rate:   78 / minute BP sitting:   130 / 80  (left arm)  Vitals Entered By: Doristine Devoid (March 09, 2008 11:07 AM)                 Chief Complaint:  3 week rov on new med.  History of Present Illness: ROV Gout-- symptoms from last OV quikly resolved, XR rersults discussed  Hypertension-- off ACE and HCTZ , tolerates carvedilol  well  Hyperlipidemia-- currently only on simvastatin, no s/e although wonders if the "gassy" feeling he has is related to simvast.     Current Allergies: No known allergies   Past Medical History:    Reviewed history from 06/30/2007 and no changes required:       COPD       Diabetes mellitus, type II       Gout       Hypertension       Hyperlipidemia       SLEEP APNEA          Past Surgical History:    Reviewed history from 11/27/2006 and no changes required:       toe surgery     Review of Systems  General      Denies fever and weight loss.  GI      Denies abdominal pain, bloody stools, and diarrhea.      occ loose stools   MS      Denies muscle aches.   Physical Exam  General:     alert and well-developed.   Lungs:     normal respiratory effort, no intercostal retractions, no accessory muscle use, and normal breath sounds.   Heart:     normal rate, regular rhythm, no murmur, and no gallop.   Abdomen:     Bowel sounds positive,abdomen soft and non-tender without masses, organomegaly   Extremities:     no pretibial edema bilaterally     Impression & Recommendations:  Problem # 1:  DYSPEPSIA (ICD-536.8) samples of Align, if that helps... continue w/ one a day If no help , hold simvastatin x 2 weeks and see if that helps the "gassy" feeling Orders: TLB-Hepatic/Liver Function Pnl (80076-HEPATIC) TLB-CBC Platelet - w/Differential (85025-CBCD) TLB-TSH (Thyroid Stimulating Hormone)  (84443-TSH)   Problem # 2:  HYPERLIPIDEMIA (ICD-272.4) on simva only, not on fenofibrate  His updated medication list for this problem includes:    Simvastatin 80 Mg Tabs (Simvastatin) .Marland Kitchen... 1 by mouth qhs  Labs Reviewed: Chol: 106 (10/06/2006)   HDL: 28.9 (10/06/2006)   LDL: 43 (10/06/2006)   TG: 173 (10/06/2006) SGOT: 28 (10/06/2006)   SGPT: 22 (12/23/2007)   Problem # 3:  GOUT (ICD-274.9) better  His updated medication list for this problem includes:    Colchicine 0.6 Mg Tabs (Colchicine) .Marland Kitchen... Take one tablet four times daily.  Orders: T-Uric Acid (Blood) (43329-51884)   Problem # 4:  HYPERTENSION (ICD-401.9) seems at goal  His updated medication list for this problem includes:    Amlodipine Besylate 10 Mg Tabs (Amlodipine besylate) .Marland Kitchen... 1 by mouth once daily    Carvedilol 12.5 Mg Tabs (Carvedilol) .Marland Kitchen... 1 by mouth two times a day  Orders: Venipuncture (16606) TLB-BMP (Basic Metabolic Panel-BMET) (80048-METABOL)  BP today: 130/80 Prior BP: 132/70 (02/17/2008)  Labs Reviewed: Creat: 1.6 (02/15/2008)  Chol: 106 (10/06/2006)   HDL: 28.9 (10/06/2006)   LDL: 43 (10/06/2006)   TG: 173 (10/06/2006)   Complete Medication List: 1)  Baby Aspirin 81 Mg Chew (Aspirin) 2)  Actoplus Met 15-500 Mg Tabs (Pioglitazone hcl-metformin hcl) .... Take one tablet by mouth daily 3)  Colchicine 0.6 Mg Tabs (Colchicine) .... Take one tablet four times daily. 4)  Humalog Mix 75/25 Kwikpen 75-25 % Susp (Insulin lispro prot & lispro) .... 30 u in am 15 u in pm 5)  Mvi  6)  Fish Oil  7)  Simvastatin 80 Mg Tabs (Simvastatin) .Marland Kitchen.. 1 by mouth qhs 8)  Amlodipine Besylate 10 Mg Tabs (Amlodipine besylate) .Marland Kitchen.. 1 by mouth once daily 9)  Carvedilol 12.5 Mg Tabs (Carvedilol) .Marland Kitchen.. 1 by mouth two times a day 10)  Vicodin 5-500 Mg Tabs (Hydrocodone-acetaminophen) .Marland Kitchen.. 1 or 2 by mouth q 6 hours   Patient Instructions: 1)  samples of Align 1 tab a day , if that helps your stomach continue w/ one a  day 2)  If no help with Align ,  hold simvastatin x 2 weeks and see if that helps the "gassy" feeling 3)  Please schedule a  PHYSICAL in 3 to  4 months.   ]

## 2010-05-09 NOTE — Progress Notes (Signed)
Summary: NEED REPLACEMENT FOR COLCHICINE//[name] See Please  Phone Note Call from Patient Call back at [facility] Phone [phone] Call back at Work Phone [phone]   Caller: Patient Summary of Call: PATIENT STATES : COLCHICINE 0.6 MG HAS BEEN PULLED FROM MARKET BY THE FDA!!  [employer] SUGGESTS PRESCRIPTION COLCRYS  WHAT DO YOU WANT TO DO ABOUT A NEW MEDICATION??  PATIENT NEEDS MEDICINE ASAP---HE IS OUT !!!  PLEASE CALL HIM TO DISCUSS--CALL IN NEW MEDICATION TO [facility], [address], [place] [address] Initial call taken by: Jerolyn Shin,  April 10, 2009 2:52 PM  Follow-up for Phone Call        advise patient: colcrys is a brand of colchicine ok Colcrys 0.6 every 6 hours as needed #120, 1 RF he usually takes it twice a day (will consider allopurinol on return to the office) Bradley Johns E. Theodus Ran MD  April 10, 2009 4:46 PM   discussed with pt Bradley Johns  April 10, 2009 4:52 PM       New/Updated Medications: COLCRYS 0.6 MG TABS (COLCHICINE) 1 by mouth every 6 hours prn [BMN] Prescriptions: COLCRYS 0.6 MG TABS (COLCHICINE) 1 by mouth every 6 hours prn Brand medically necessary #120 x 1   Entered by:   Bradley Johns   Authorized by:   Nolon Rod. Lalana Wachter MD   Signed by:   Bradley Johns on 04/10/2009   Method used:   Electronically to        [facility] (retail)       [address]       [place], [place]  [address]       Ph: [phone]       Fax: [phone]   RxID:   [id number]

## 2010-05-09 NOTE — Progress Notes (Signed)
Summary: Alej--Generic for Zetia  Phone Note Call from Patient Call back at Home Phone [phone]   Summary of Call: Pt calling wanting a generic for zetia 10 mg that the insurance will pay for.  Call into the [facility] on [address]--ph-[phone]. Please call pt or leave a message when this is done. Initial call taken by: Freddy Jaksch,  June 01, 2007 1:25 PM  Follow-up for Phone Call        There is unfortunately no generic equivalent of Zetia. Follow-up by: Leanne Chang MD,  June 01, 2007 1:49 PM  Additional Follow-up for Phone Call Additional follow up Details #1::        Patient aware ...................................................................Ardyth Man  June 01, 2007 1:59 PM  Additional Follow-up by: Ardyth Man,  June 01, 2007 1:59 PM    Additional Follow-up for Phone Call Additional follow up Details #2::    Needs prior authorization with insurance company and pharmacy is faxing it to [place].  ...................................................................Ardyth Man  June 01, 2007 2:06 PM Patient aware ...................................................................Ardyth Man  June 01, 2007 2:06 PM  Follow-up by: Ardyth Man,  June 01, 2007 2:06 PM  New/Updated Medications: ZETIA 10 MG  TABS (EZETIMIBE) Take one tablet daily   Prescriptions: ZETIA 10 MG  TABS (EZETIMIBE) Take one tablet daily  #30 x 3   Entered by:   Ardyth Man   Authorized by:   Leanne Chang MD   Signed by:   Ardyth Man on 06/01/2007   Method used:   Electronically sent to ...       [facility]  [address]       [address]       [place], [place]  [address]       Ph: [phone]       Fax: [phone]   RxID:   [id number]

## 2010-05-09 NOTE — Progress Notes (Signed)
Summary: RX  Phone Note Call from Patient Call back at Home Phone [phone] Call back at [phone]   Caller: Patient Summary of Call: REFILL--CARVEDILOL12.5,DOXAZOSIN 4 MG,LOSARTAN 50 MG, AMLODIPINE10 MG, VICODIN  5-500 MG, TO BE [facility]ENT [facility] [address]--FOR 30 DAY SUPPLY Initial call taken by: Freddy Jaksch,  November 08, 2009 4:07 PM  Follow-up for Phone Call        ok to refill   Vicodin, #60 and 2 refills Next other medications can be refilled for one year  Follow-up by: Kennisha Qin E. Lilia Letterman MD,  November 09, 2009 10:43 AM  Additional Follow-up for Phone Call Additional follow up Details #1::        Refilled meds, pt is aware. Cancelled the meds that [facility] to [facility] [address] Army Fossa CMA  November 09, 2009 10:55 AM     Prescriptions: VICODIN 5-500 MG TABS (HYDROCODONE-ACETAMINOPHEN) 1 or 2 by mouth q 6 hours  #60 x 2   Entered by:   Army Fossa CMA   Authorized by:   Nolon Rod. Jaelyn Bourgoin MD   Signed by:   Army Fossa CMA on 11/09/2009   Method used:   Printed then faxed to ...       [facility] [phone]* (retail)       [address] [place], [place]  [address]       Ph: [phone] or [phone]       Fax: [phone]   RxID:   [id number] AMLODIPINE BESYLATE 10 MG TABS (AMLODIPINE BESYLATE) 1 by mouth once daily  #30 x 11   Entered by:   Army Fossa CMA   Authorized by:   Nolon Rod. Annalaura Sauseda MD   Signed by:   Army Fossa CMA on 11/09/2009   Method used:   Electronically to        [facility] [phone]* (retail)       [address] [place], [place]  [address]       Ph: [phone] or [phone]       Fax: [phone]   RxID:   [id number] LOSARTAN POTASSIUM 50 MG TABS (LOSARTAN POTASSIUM) one tablet daily  #30 x 11   Entered by:   Army Fossa CMA   Authorized by:   Nolon Rod. Jamorion Gomillion MD   Signed by:   Army Fossa CMA on 11/09/2009   Method used:   Electronically to        [facility] [phone]* (retail)  [address] [place], [place]  [address]       Ph: [phone] or [phone]       Fax: [phone]   RxID:   [id number] DOXAZOSIN MESYLATE 4 MG TABS (DOXAZOSIN MESYLATE) 1 by mouth at bedtime  #30 x 11   Entered by:   Army Fossa CMA   Authorized by:   Nolon Rod. Truong Delcastillo MD   Signed by:   Army Fossa CMA on 11/09/2009   Method used:   Electronically to        [facility] [phone]* (retail)       [address] [place], [place]  [address]       Ph: [phone] or [phone]       Fax: [phone]   RxID:  [id number] CARVEDILOL 12.5 MG TABS (CARVEDILOL) 1 by mouth two times a day  #30 x 11   Entered by:   Army Fossa CMA   Authorized by:   Nolon Rod. Vincentina Sollers MD   Signed by:   Army Fossa CMA on 11/09/2009   Method used:   Electronically to        [facility] [phone]* (retail)       [address] [place], [place]  [address]       Ph: [phone] or [phone]       Fax: [phone]   RxID:   [id number] AMLODIPINE BESYLATE 10 MG TABS (AMLODIPINE BESYLATE) 1 by mouth once daily  #30 x 11   Entered by:   Army Fossa CMA   Authorized by:   Nolon Rod. Jannett Schmall MD   Signed by:   Army Fossa CMA on 11/09/2009   Method used:   Electronically to        [facility] (retail)       [address]       [place], [place]  [address]       Ph: [phone]       Fax: [phone]   RxID:   [id number] EGBTDVVO POTASSIUM 50 MG TABS (LOSARTAN POTASSIUM) one tablet daily  #30 x 11   Entered by:   Army Fossa CMA   Authorized by:   Nolon Rod. Riccardo Holeman MD   Signed by:   Army Fossa CMA on 11/09/2009   Method used:   Electronically to        [facility] (retail)       [address]       [place], [place]  [address]       Ph: [phone]       Fax: [phone]   RxID:   [id number] DOXAZOSIN MESYLATE 4 MG TABS (DOXAZOSIN MESYLATE) 1 by mouth at bedtime  #30 x 11   Entered by:   Army Fossa CMA   Authorized by:   Nolon Rod. Beryl Hornberger  MD   Signed by:   Army Fossa CMA on 11/09/2009   Method used:   Electronically to        [facility] (retail)       [address]       [place], [place]  [address]       Ph: [phone]       Fax: [phone]   RxID:   [id number] CARVEDILOL 12.5 MG TABS (CARVEDILOL) 1 by mouth two times a day  #30 x 11   Entered by:   Army Fossa CMA   Authorized by:   Nolon Rod. Arvada Seaborn MD   Signed by:   Army Fossa CMA on 11/09/2009   Method used:   Electronically to        [facility] (retail)       [address]       [place], [place]  [address]       Ph: [phone]       Fax: [phone]   RxID:   [id number]

## 2010-05-09 NOTE — Progress Notes (Signed)
Summary: PRIOR AUTH APPROVED FOR ZETIA--[employer]  Phone Note Refill Request   PRIOR AUTH APPROVED FOR ZETIA INDEFINITE PER [facility] [facility] FAXED  Initial call taken by: Kandice Hams,  June 04, 2007 2:26 PM

## 2010-05-09 NOTE — Assessment & Plan Note (Signed)
Summary: roa 2 weeks.cbs   Vital Signs:  Patient Profile:   76 Years Old Male Height:     68 inches Weight:      193.13 pounds Temp:     97.8 degrees F oral Pulse rate:   68 / minute Resp:     16 per minute BP sitting:   145 / 90  (right arm)  Pt. in pain?   no  Vitals Entered By: Ardyth Man (January 13, 2007 11:23 AM)                  Chief Complaint:  2 week follow up for BP.  History of Present Illness: Tolerating Benazepril/HCTZ well. No side effects. No complaints today. No cardiovascular complaints.        Physical Exam  General:     overweight-appearing.   Lungs:     Normal respiratory effort, chest expands symmetrically. Lungs are clear to auscultation, no crackles or wheezes. Heart:     Normal rate and regular rhythm. S1 and S2 normal without gallop, murmur, click, rub or other extra sounds. Extremities:     No clubbing, cyanosis, edema, or deformity noted with normal full range of motion of all joints.      Impression & Recommendations:  Problem # 1:  HYPERTENSION (ICD-401.9) elevated Increase to 20/25 of Benazepril/HCT F/u in 1 month Schedule Flu shot Check BMET His updated medication list for this problem includes:    Caduet 10-80 Mg Tabs (Amlodipine-atorvastatin) .Marland Kitchen... 1 by mouth qd    Benazepril-hydrochlorothiazide 20-25 Mg Tabs (Benazepril-hydrochlorothiazide) .Marland Kitchen... 1 by mouth qd  Orders: TLB-BMP (Basic Metabolic Panel-BMET) (80048-METABOL)  BP today: 145/90 Prior BP: 138/74 (11/27/2006)  Labs Reviewed: Creat: 1.1 (11/27/2006) Chol: 106 (10/06/2006)   HDL: 28.9 (10/06/2006)   LDL: 43 (10/06/2006)   TG: 173 (10/06/2006)   Complete Medication List: 1)  Caduet 10-80 Mg Tabs (Amlodipine-atorvastatin) .Marland Kitchen.. 1 by mouth qd 2)  Baby Aspirin 81 Mg Chew (Aspirin) 3)  Benazepril-hydrochlorothiazide 20-25 Mg Tabs (Benazepril-hydrochlorothiazide) .Marland Kitchen.. 1 by mouth qd 4)  Actoplus Met 15-500 Mg Tabs (Pioglitazone hcl-metformin hcl) ....  Take one tablet by mouth daily 5)  Zetia 10 Mg Tabs (Ezetimibe) 6)  Colchicine 0.6 Mg Tabs (Colchicine) .... Take one tablet four times daily. 7)  Humalog Mix 75/25 Kwikpen 75-25 % Susp (Insulin lispro prot & lispro)     Prescriptions: BENAZEPRIL-HYDROCHLOROTHIAZIDE 20-25 MG  TABS (BENAZEPRIL-HYDROCHLOROTHIAZIDE) 1 by mouth qd  #30 x 3   Entered and Authorized by:   Leanne Chang MD   Signed by:   Leanne Chang MD on 01/13/2007   Method used:   Electronically sent to ...       [facility] [address]       [address]       [place], [place]  [address]       Ph: [phone]       Fax: [phone]   RxID:   [id number]  ]

## 2010-05-09 NOTE — Assessment & Plan Note (Signed)
Summary: 4 MTH FU/NS/KDC   Vital Signs:  Patient profile:   76 year old male Height:      68 inches Weight:      197 pounds O2 Sat:      93 % on Room air Pulse rate:   70 / minute BP sitting:   140 / 92  Vitals Entered By: Shary Decamp (May 28, 2009 8:31 AM)  O2 Flow:  Room air CC: rov - fasting Is Patient Diabetic? Yes   History of Present Illness: Diabetes-- used to see DR Roanna Raider, last OV aprox 2 months  , will see new endocrinologist  Gout--  no recent sx  Hypertension-- ambulatory BPs fluctuates , can't tell me for sure (140s) Hyperlipidemia-- good medication compliance , diet poor      Current Medications (verified): 1)  Baby Aspirin 81 Mg  Chew (Aspirin) 2)  Actoplus Met 15-500 Mg  Tabs (Pioglitazone Hcl-Metformin Hcl) .... Take One Tablet By Mouth Daily 3)  Humalog Mix 75/25 Kwikpen 75-25 %  Susp (Insulin Lispro Prot & Lispro) .... 30 U in Am 15 U in Pm 4)  Mvi 5)  Fish Oil .... Restart 04-26-08 6)  Amlodipine Besylate 10 Mg Tabs (Amlodipine Besylate) .Marland Kitchen.. 1 By Mouth Once Daily 7)  Carvedilol 12.5 Mg Tabs (Carvedilol) .Marland Kitchen.. 1 By Mouth Two Times A Day 8)  Vicodin 5-500 Mg Tabs (Hydrocodone-Acetaminophen) .Marland Kitchen.. 1 or 2 By Mouth Q 6 Hours 9)  Simvastatin 80 Mg Tabs (Simvastatin) .... As Directed 10)  Doxazosin Mesylate 2 Mg Tabs (Doxazosin Mesylate) .Marland Kitchen.. 1 By Mouth At Bedtime 11)  Benazepril Hcl 20 Mg Tabs (Benazepril Hcl) .Marland Kitchen.. 1 By Mouth Qd 12)  Colcrys 0.6 Mg Tabs (Colchicine) .Marland Kitchen.. 1 By Mouth Every 6 Hours Prn 13)  Bd Pen Needle Short U/f 31g X 8 Mm Misc (Insulin Pen Needle) .... Pt Test  Two Times A Day Dx 250.00  Allergies (verified): No Known Drug Allergies  Past History:  Past Medical History: COPD Diabetes mellitus, type II Gout Hypertension Hyperlipidemia SLEEP APNEA  Osteoarthritis Benign prostatic hypertrophy  Past Surgical History: Reviewed history from 11/27/2006 and no changes required. toe surgery  Social History: Married retired 2  children Regular exercise--yes 3/week diet-- does not watch   Review of Systems CV:  Denies chest pain or discomfort and swelling of feet. Resp:  Denies cough and sputum productive. GI:  Denies diarrhea, nausea, and vomiting. GU:  still has nocturia .  Physical Exam  General:  alert and well-developed.   Lungs:  normal respiratory effort, no intercostal retractions, no accessory muscle use, and normal breath sounds.   Heart:  normal rate, regular rhythm, no murmur, and no gallop.   Extremities:  no edema   Impression & Recommendations:  Problem # 1:  GUAIAC POSITIVE STOOL (ICD-578.1) was refered to GI , Dr Loreta Ave, 4-10 get records   Problem # 2:  HYPERLIPIDEMIA (ICD-272.4)  at goal  His updated medication list for this problem includes:    Simvastatin 80 Mg Tabs (Simvastatin) .Marland Kitchen... As directed  Labs Reviewed: SGOT: 26 (07/12/2008)   SGPT: 32 (07/12/2008)   HDL:34.80 (07/11/2008), 38.3 (05/31/2008)  LDL:77 (07/11/2008), DEL (16/01/9603)  Chol:139 (07/11/2008), 260 (05/31/2008)  Trig:137.0 (07/11/2008), 162 (05/31/2008)  Orders: Venipuncture (54098) TLB-Hepatic/Liver Function Pnl (80076-HEPATIC)  Problem # 3:  HYPERTENSION (ICD-401.9)  ambulatory BPs 140, patient concerned about the # of meds he takes rec : no change on meds, watch diet-exercise more His updated medication list for this problem includes:  Carvedilol 12.5 Mg Tabs (Carvedilol) .Marland Kitchen... 1 by mouth two times a day    Doxazosin Mesylate 4 Mg Tabs (Doxazosin mesylate) .Marland Kitchen... 1 by mouth at bedtime    Benazepril Hcl 20 Mg Tabs (Benazepril hcl) .Marland Kitchen... 1 by mouth qd    Amlodipine Besylate 10 Mg Tabs (Amlodipine besylate) .Marland Kitchen... 1 by mouth once daily    BP today: 140/92 Prior BP: 148/70 (01/31/2009)  Labs Reviewed: K+: 4.0 (01/31/2009) Creat: : 1.2 (01/31/2009)   Chol: 139 (07/11/2008)   HDL: 34.80 (07/11/2008)   LDL: 77 (07/11/2008)   TG: 137.0 (07/11/2008)  Orders: TLB-BMP (Basic Metabolic Panel-BMET)  (80048-METABOL)  Problem # 4:  DIABETES MELLITUS, TYPE II (ICD-250.00) per endocrinology His updated medication list for this problem includes:    Benazepril Hcl 20 Mg Tabs (Benazepril hcl) .Marland Kitchen... 1 by mouth qd    Actoplus Met 15-500 Mg Tabs (Pioglitazone hcl-metformin hcl) .Marland Kitchen... Take one tablet by mouth daily    Humalog Mix 75/25 Kwikpen 75-25 % Susp (Insulin lispro prot & lispro) .Marland KitchenMarland KitchenMarland KitchenMarland Kitchen 30 u in am 15 u in pm    Baby Aspirin 81 Mg Chew (Aspirin)  Problem # 5:  BENIGN PROSTATIC HYPERTROPHY (ICD-600.00) continue w/ nocturia , increase cardura dose  DRE,  PSA ok last year  Complete Medication List: 1)  Carvedilol 12.5 Mg Tabs (Carvedilol) .Marland Kitchen.. 1 by mouth two times a day 2)  Doxazosin Mesylate 4 Mg Tabs (Doxazosin mesylate) .Marland Kitchen.. 1 by mouth at bedtime 3)  Benazepril Hcl 20 Mg Tabs (Benazepril hcl) .Marland Kitchen.. 1 by mouth qd 4)  Amlodipine Besylate 10 Mg Tabs (Amlodipine besylate) .Marland Kitchen.. 1 by mouth once daily 5)  Simvastatin 80 Mg Tabs (Simvastatin) .... As directed 6)  Actoplus Met 15-500 Mg Tabs (Pioglitazone hcl-metformin hcl) .... Take one tablet by mouth daily 7)  Humalog Mix 75/25 Kwikpen 75-25 % Susp (Insulin lispro prot & lispro) .... 30 u in am 15 u in pm 8)  Baby Aspirin 81 Mg Chew (Aspirin) 9)  Mvi  10)  Fish Oil  .... Restart 04-26-08 11)  Vicodin 5-500 Mg Tabs (Hydrocodone-acetaminophen) .Marland Kitchen.. 1 or 2 by mouth q 6 hours 12)  Colcrys 0.6 Mg Tabs (Colchicine) .Marland Kitchen.. 1 by mouth every 6 hours prn 13)  Bd Pen Needle Short U/f 31g X 8 Mm Misc (Insulin pen needle) .... Pt test  two times a day dx 250.00  Patient Instructions: 1)  increased  doxazosin  dose 2)  if that doesn't help your prostate, let me know 3)   Limit your Sodium(salt) to less than 4 grams a day(slightly less than 1 teaspoon) to help your BP 4)  exercise daily 5)  Check your blood pressure 2 or 3 times a week. If it is more than 140/85 consistently,please let us know  6)  Please schedule a follow-up appointment in 3 months .    Prescriptions: DOXAZOSIN MESYLATE 4 MG TABS (DOXAZOSIN MESYLATE) 1 by mouth at bedtime  #90 x 1   Entered and Authorized by:   Elita Quick E. Aalayah Riles MD   Signed by:   Nolon Rod. Forbes Loll MD on 05/28/2009   Method used:   Print then Give to Patient   RxID:   [id number]   Appended Document: 4 MTH FU/NS/KDC see probleem #1, was seen by GI, they rec a Cscope, I don't know if he had that done

## 2010-05-09 NOTE — Progress Notes (Signed)
Summary: Refill Request  Phone Note Refill Request Message from:  Fax from Pharmacy on July 11, 2009 4:59 PM  Refills Requested: Medication #1:  SIMVASTATIN 80 MG TABS as directed   Dosage confirmed as above?Dosage Confirmed  Medication #2:  CARVEDILOL 12.5 MG TABS 1 by mouth two times a day  Medication #3:  BENAZEPRIL HCL 20 MG TABS 1 by mouth qd  Medication #4:  AMLODIPINE BESYLATE 10 MG TABS 1 by mouth once daily [facility] RX COLCRYS SENT TO [facility] [address]  Next Appointment Scheduled: 5.26.11 8AM Initial call taken by: Lannette Donath,  July 11, 2009 5:00 PM    Prescriptions: COLCRYS 0.6 MG TABS (COLCHICINE) 1 by mouth every 6 hours prn Brand medically necessary #120 x 0   Entered by:   Shary Decamp   Authorized by:   Nolon Rod. Paz MD   Signed by:   Shary Decamp on 07/12/2009   Method used:   Electronically to        [facility] [phone]* (retail)       [address] [place], [place]  [address]       Ph: [phone] or [phone]       Fax: [phone]   RxID:   [id number] AMLODIPINE BESYLATE 10 MG TABS (AMLODIPINE BESYLATE) 1 by mouth once daily  #90 x 1   Entered by:   Shary Decamp   Authorized by:   Nolon Rod. Paz MD   Signed by:   Shary Decamp on 07/12/2009   Method used:   Faxed to ...       [facility] (mail-order)             , [place]         Ph: [phone]       Fax: [phone]   RxID:   [id number] BENAZEPRIL HCL 20 MG TABS (BENAZEPRIL HCL) 1 by mouth qd  #90 x 1   Entered by:   Shary Decamp   Authorized by:   Nolon Rod. Paz MD   Signed by:   Shary Decamp on 07/12/2009   Method used:   Faxed to ...       [facility] (mail-order)             , [place]         Ph: [phone]       Fax: [phone]   RxID:   [id number] SIMVASTATIN 80 MG TABS (SIMVASTATIN) as directed  #90 x 1   Entered by:   Shary Decamp   Authorized by:   Nolon Rod. Paz MD   Signed by:   Shary Decamp on 07/12/2009   Method used:   Faxed to  ...       [facility] (mail-order)             , [place]         Ph: [phone]       Fax: [phone]   RxID:   [id number] CARVEDILOL 12.5 MG TABS (CARVEDILOL) 1 by mouth two times a day  #180 x 1   Entered by:   Shary Decamp   Authorized by:   Nolon Rod. Paz MD   Signed by:   Shary Decamp on 07/12/2009   Method used:   Faxed to ...       [facility] Environmental education officer)             ,  [place]         Ph: [phone]       Fax: [phone]   RxID:   [id number]

## 2010-05-09 NOTE — Progress Notes (Signed)
  Phone Note Outgoing Call Call back at [facility] Phone [phone]   Call placed by: Ardyth Man,  February 19, 2007 5:16 PM Call placed to: Patient Summary of Call: [facility] and mailed lab letter ...................................................................Ardyth Man  February 19, 2007 5:16 PM

## 2010-05-09 NOTE — Assessment & Plan Note (Signed)
Summary: roa 4 months/cbs   Vital Signs:  Patient Profile:   76 Years Old Male Height:     68 inches Weight:      192 pounds Pulse rate:   76 / minute BP sitting:   158 / 72  Vitals Entered By: Shary Decamp (May 24, 2008 3:53 PM)                 Chief Complaint:  rov - joint pain.  History of Present Illness: f/u from last OV Hypertension-- no ambulatory BPs recently  Hyperlipidemia--on fish oil only Osteoarthritis-- cont.w/ the same pains as before, vicodin does help      Updated Prior Medication List: BABY ASPIRIN 81 MG  CHEW (ASPIRIN)  ACTOPLUS MET 15-500 MG  TABS (PIOGLITAZONE HCL-METFORMIN HCL) TAKE ONE TABLET BY MOUTH DAILY COLCHICINE 0.6 MG  TABS (COLCHICINE) Take one tablet four times daily. HUMALOG MIX 75/25 KWIKPEN 75-25 %  SUSP (INSULIN LISPRO PROT & LISPRO) 30 U IN AM 15 U IN PM * MVI  * FISH OIL restart 04-26-08 SIMVASTATIN 80 MG  TABS (SIMVASTATIN) holding - elevated lft's AMLODIPINE BESYLATE 10 MG TABS (AMLODIPINE BESYLATE) 1 by mouth once daily CARVEDILOL 12.5 MG TABS (CARVEDILOL) 1 by mouth two times a day VICODIN 5-500 MG TABS (HYDROCODONE-ACETAMINOPHEN) 1 or 2 by mouth q 6 hours  Current Allergies (reviewed today): No known allergies   Past Medical History:    Reviewed history from 04/18/2008 and no changes required:       COPD       Diabetes mellitus, type II       Gout       Hypertension       Hyperlipidemia       SLEEP APNEA        Osteoarthritis  Past Surgical History:    Reviewed history from 11/27/2006 and no changes required:       toe surgery     Review of Systems  CV      Denies chest pain or discomfort and swelling of feet.  Resp      Denies shortness of breath.   Physical Exam  General:     alert and well-developed.   Lungs:     normal respiratory effort, no intercostal retractions, no accessory muscle use, and normal breath sounds.   Heart:     normal rate, regular rhythm, no murmur, and no gallop.     Msk:     No deformity or scoliosis noted of thoracic or lumbar spine.   Extremities:     wrists: slightly  puffy in the L, no warm or red     Impression & Recommendations:  Problem # 1:  OSTEOARTHRITIS (ICD-715.90) still hurting, labs d/w patient, symptoms likely from OA but wrist still puffy ----> refer to rheumatology His updated medication list for this problem includes:    Baby Aspirin 81 Mg Chew (Aspirin)    Vicodin 5-500 Mg Tabs (Hydrocodone-acetaminophen) .Marland Kitchen... 1 or 2 by mouth q 6 hours  Orders: Rheumatology Referral (Rheumatology)   Problem # 2:  HYPERLIPIDEMIA (ICD-272.4) no recent FLP, unsure if Dr Roanna Raider f/u cholesterol will rec: diet-exercise -fish oil  recheck FLP most likely will need to restart simva w.close LFTs f/u  His updated medication list for this problem includes:    Simvastatin 80 Mg Tabs (Simvastatin) ..... Holding - elevated lft's   Problem # 3:  HYPERTENSION (ICD-401.9) no change for now  His updated medication list for this problem includes:  Amlodipine Besylate 10 Mg Tabs (Amlodipine besylate) .Marland Kitchen... 1 by mouth once daily    Carvedilol 12.5 Mg Tabs (Carvedilol) .Marland Kitchen... 1 by mouth two times a day  BP today: 158/72 Prior BP: 132/74 (04/18/2008)  Labs Reviewed: Creat: 1.3 (03/09/2008) Chol: 106 (10/06/2006)   HDL: 28.9 (10/06/2006)   LDL: 43 (10/06/2006)   TG: 173 (10/06/2006)   Complete Medication List: 1)  Baby Aspirin 81 Mg Chew (Aspirin) 2)  Actoplus Met 15-500 Mg Tabs (Pioglitazone hcl-metformin hcl) .... Take one tablet by mouth daily 3)  Colchicine 0.6 Mg Tabs (Colchicine) .... Take one tablet four times daily. 4)  Humalog Mix 75/25 Kwikpen 75-25 % Susp (Insulin lispro prot & lispro) .... 30 u in am 15 u in pm 5)  Mvi  6)  Fish Oil  .... Restart 04-26-08 7)  Simvastatin 80 Mg Tabs (Simvastatin) .... Holding - elevated lft's 8)  Amlodipine Besylate 10 Mg Tabs (Amlodipine besylate) .Marland Kitchen.. 1 by mouth once daily 9)  Carvedilol 12.5 Mg Tabs  (Carvedilol) .Marland Kitchen.. 1 by mouth two times a day 10)  Vicodin 5-500 Mg Tabs (Hydrocodone-acetaminophen) .Marland Kitchen.. 1 or 2 by mouth q 6 hours   Patient Instructions: 1)  came back fasting 2)  FLP AST ALT  3)  Dx high chol 4)  Please schedule a follow-up appointment in 4 months.   Prescriptions: VICODIN 5-500 MG TABS (HYDROCODONE-ACETAMINOPHEN) 1 or 2 by mouth q 6 hours  #60 x 1   Entered by:   Shary Decamp   Authorized by:   Nolon Rod. Artina Minella MD   Signed by:   Shary Decamp on 05/24/2008   Method used:   Print then Give to Patient   RxID:   [id number]

## 2010-05-09 NOTE — Letter (Signed)
Summary: d/c actos ($$)---Endocrinology  [facility]   Imported By: Lanelle Bal 12/17/2009 14:18:45  _____________________________________________________________________  External Attachment:    Type:   Image     Comment:   External Document

## 2010-05-09 NOTE — Assessment & Plan Note (Signed)
Summary: BMP,BP CHECK/KDC  Nurse Visit   Vital Signs:  Patient profile:   76 year old male Height:      68 inches Weight:      195 pounds BMI:     29.76 Pulse rate:   66 / minute Pulse rhythm:   regular BP sitting:   148 / 70  (left arm) Cuff size:   large  Vitals Entered By: Shary Decamp (January 31, 2009 10:57 AM)  Impression & Recommendations:  Problem # 1:  HYPERTENSION (ICD-401.9)  BP improving, good ambulatory BPs   His updated medication list for this problem includes:    Amlodipine Besylate 10 Mg Tabs (Amlodipine besylate) .Marland Kitchen... 1 by mouth once daily    Carvedilol 12.5 Mg Tabs (Carvedilol) .Marland Kitchen... 1 by mouth two times a day    Doxazosin Mesylate 2 Mg Tabs (Doxazosin mesylate) .Marland Kitchen... 1 by mouth at bedtime    Benazepril Hcl 20 Mg Tabs (Benazepril hcl) .Marland Kitchen... 1 by mouth qd  Orders: TLB-BMP (Basic Metabolic Panel-BMET) (80048-METABOL) Venipuncture (36644)  BP today: 148/70 Prior BP: 164/82 (01/24/2009)  Labs Reviewed: K+: 4.4 (07/12/2008) Creat: : 1.22 (07/12/2008)   Chol: 139 (07/11/2008)   HDL: 34.80 (07/11/2008)   LDL: 77 (07/11/2008)   TG: 137.0 (07/11/2008)  Complete Medication List: 1)  Baby Aspirin 81 Mg Chew (Aspirin) 2)  Actoplus Met 15-500 Mg Tabs (Pioglitazone hcl-metformin hcl) .... Take one tablet by mouth daily 3)  Colchicine 0.6 Mg Tabs (Colchicine) .... Take one tablet four times daily. 4)  Humalog Mix 75/25 Kwikpen 75-25 % Susp (Insulin lispro prot & lispro) .... 30 u in am 15 u in pm 5)  Mvi  6)  Fish Oil  .... Restart 04-26-08 7)  Amlodipine Besylate 10 Mg Tabs (Amlodipine besylate) .Marland Kitchen.. 1 by mouth once daily 8)  Carvedilol 12.5 Mg Tabs (Carvedilol) .Marland Kitchen.. 1 by mouth two times a day 9)  Vicodin 5-500 Mg Tabs (Hydrocodone-acetaminophen) .Marland Kitchen.. 1 or 2 by mouth q 6 hours 10)  Simvastatin 80 Mg Tabs (Simvastatin) .... As directed 11)  Doxazosin Mesylate 2 Mg Tabs (Doxazosin mesylate) .Marland Kitchen.. 1 by mouth at bedtime 12)  Benazepril Hcl 20 Mg Tabs (Benazepril  hcl) .Marland Kitchen.. 1 by mouth qd  CC: BP CHECK Comments    - bmp drawn today  - pt feels fine  - checked bp with patient's cuff -- 150/78 BP READINGS @ HOME: 134/69, 137/72, 142/79, 133/71, 127/66, 134/70, 161/79, 131/71, 133/73, 118/67 .Shary Decamp  January 31, 2009 10:59 AM    Current Medications (verified): 1)  Baby Aspirin 81 Mg  Chew (Aspirin) 2)  Actoplus Met 15-500 Mg  Tabs (Pioglitazone Hcl-Metformin Hcl) .... Take One Tablet By Mouth Daily 3)  Colchicine 0.6 Mg  Tabs (Colchicine) .... Take One Tablet Four Times Daily. 4)  Humalog Mix 75/25 Kwikpen 75-25 %  Susp (Insulin Lispro Prot & Lispro) .... 30 U in Am 15 U in Pm 5)  Mvi 6)  Fish Oil .... Restart 04-26-08 7)  Amlodipine Besylate 10 Mg Tabs (Amlodipine Besylate) .Marland Kitchen.. 1 By Mouth Once Daily 8)  Carvedilol 12.5 Mg Tabs (Carvedilol) .Marland Kitchen.. 1 By Mouth Two Times A Day 9)  Vicodin 5-500 Mg Tabs (Hydrocodone-Acetaminophen) .Marland Kitchen.. 1 or 2 By Mouth Q 6 Hours 10)  Simvastatin 80 Mg Tabs (Simvastatin) .... As Directed 11)  Doxazosin Mesylate 2 Mg Tabs (Doxazosin Mesylate) .Marland Kitchen.. 1 By Mouth At Bedtime 12)  Benazepril Hcl 20 Mg Tabs (Benazepril Hcl) .Marland Kitchen.. 1 By Mouth Qd  Allergies (verified): No Known  Drug Allergies  Orders Added: 1)  TLB-BMP (Basic Metabolic Panel-BMET) [80048-METABOL] 2)  Est. Patient Level I [04540] 3)  Venipuncture [98119]

## 2010-05-09 NOTE — Letter (Signed)
Summary: Results Follow-up Letter  [facility] [place]  [address] [place], [place] [address]   Phone: [phone]  Fax: [phone]    02/19/2007        Anne Shutter [address] [place], [place]  [address]  Dear Mr. LUVIANO,   The following are the results of your recent test(s):  Test     Result     Pap Smear    Normal_______  Not Normal_____       Comments: _________________________________________________________ Cholesterol LDL(Bad cholesterol):          Your goal is less than:         HDL (Good cholesterol):        Your goal is more than: _________________________________________________________ Other Tests:   _________________________________________________________  Please call for an appointment Or ___Please see attached.______________________________________________________ _________________________________________________________ _________________________________________________________  Sincerely,  Ardyth Man [place] at [facility]

## 2010-05-09 NOTE — Miscellaneous (Signed)
  Clinical Lists Changes  Observations: Added new observation of COLONOSCOPY: abnormal (09/13/2003 9:13)      Preventive Care Screening  Colonoscopy:    Date:  09/13/2003    Results:  abnormal     GI: Dr Loreta Ave

## 2010-05-09 NOTE — Assessment & Plan Note (Signed)
Summary: rto 6 months.cbs   Vital Signs:  Patient profile:   76 year old male Height:      68 inches Weight:      196.8 pounds BMI:     30.03 Pulse rate:   80 / minute Pulse rhythm:   regular BP sitting:   160 / 84  (left arm) Cuff size:   large  Vitals Entered By: Shary Decamp (January 10, 2009 8:22 AM) CC: rov - fasting Is Patient Diabetic? Yes  Comments  - pt does not check BP @ home  Flu Vaccine Consent Questions     Do you have a history of severe allergic reactions to this vaccine? no    Any prior history of allergic reactions to egg and/or gelatin? no    Do you have a sensitivity to the preservative Thimersol? no    Do you have a past history of Guillan-Barre Syndrome? no    Do you currently have an acute febrile illness? no    Have you ever had a severe reaction to latex? no    Vaccine information given and explained to patient? yes    Are you currently pregnant? no    Lot Number:[id number]   Exp Date:10/04/2009   Site Given  Left Deltoid IM  ...Marland KitchenMarland KitchenShary Decamp  January 10, 2009 8:29 AM    History of Present Illness: OA--patient saw rheumatology of April 2010, diagnosed with osteoarthritis of the hands and feet. Labs   reviewed: BMP, LFTs, normal   vitamin D  was low at 28. Acute hepatitis panel negative. Uric acid 8.7  COPD-- asx   Diabetes mellitus-- to see Dr Ples Specter 02-2009, had eye check recently  exercising routinely, admits to poor diet----> unable to loose wt   Gout--no symptoms recently , on colchicine two times a day per rheumatology   Hypertension-- no recent ambulatory BPs , today BP slightly  elevated     Habits & Providers     Endocrinologist: doerr  Current Medications (verified): 1)  Baby Aspirin 81 Mg  Chew (Aspirin) 2)  Actoplus Met 15-500 Mg  Tabs (Pioglitazone Hcl-Metformin Hcl) .... Take One Tablet By Mouth Daily 3)  Colchicine 0.6 Mg  Tabs (Colchicine) .... Take One Tablet Four Times Daily. 4)  Humalog Mix 75/25 Kwikpen 75-25 %   Susp (Insulin Lispro Prot & Lispro) .... 30 U in Am 15 U in Pm 5)  Mvi 6)  Fish Oil .... Restart 04-26-08 7)  Amlodipine Besylate 10 Mg Tabs (Amlodipine Besylate) .Marland Kitchen.. 1 By Mouth Once Daily 8)  Carvedilol 12.5 Mg Tabs (Carvedilol) .Marland Kitchen.. 1 By Mouth Two Times A Day 9)  Vicodin 5-500 Mg Tabs (Hydrocodone-Acetaminophen) .Marland Kitchen.. 1 or 2 By Mouth Q 6 Hours 10)  Simvastatin 80 Mg Tabs (Simvastatin) .... As Directed 11)  Doxazosin Mesylate 2 Mg Tabs (Doxazosin Mesylate) .Marland Kitchen.. 1 By Mouth At Bedtime  Allergies (verified): No Known Drug Allergies  Past History:  Past Medical History: COPD Diabetes mellitus, type II Gout Hypertension Hyperlipidemia SLEEP APNEA  Osteoarthritis Benign prostatic hypertrophy  Past Surgical History: Reviewed history from 11/27/2006 and no changes required. toe surgery  Social History: Reviewed history from 07/11/2008 and no changes required. Married retired 2 children Regular exercise-yes  Review of Systems CV:  Denies chest pain or discomfort and swelling of feet. GI:  Denies bloody stools, nausea, and vomiting. Psych:  Denies anxiety and depression.  Physical Exam  General:  alert and well-developed.   Lungs:  normal respiratory effort, no intercostal  retractions, no accessory muscle use, and normal breath sounds.   Heart:  normal rate, regular rhythm, no murmur, and no gallop.   Abdomen:  soft, non-tender, no distention, no masses, no guarding, and no rigidity.  central density noted Extremities:  no edema  Diabetes Management Exam:    Eye Exam:       Eye Exam done elsewhere          Date: 10/05/2008          Results: normal          Done by: Aviva Signs, MD   Impression & Recommendations:  Problem # 1:  OSTEOARTHRITIS (ICD-715.90) status post rheumatology evaluation for  hand and feet pain, workup negative, except for a uric acid of 8.7 on April 2010 dx was OA His updated medication list for this problem includes:    Baby Aspirin 81 Mg  Chew (Aspirin)    Vicodin 5-500 Mg Tabs (Hydrocodone-acetaminophen) .Marland Kitchen... 1 or 2 by mouth q 6 hours  Problem # 2:  GOUT (ICD-274.9) uric acid 8.7 on April 2010 on colchicine b.i.d. His updated medication list for this problem includes:    Colchicine 0.6 Mg Tabs (Colchicine) .Marland Kitchen... Take one tablet four times daily.  Problem # 3:  HYPERLIPIDEMIA (ICD-272.4) at goal  His updated medication list for this problem includes:    Simvastatin 80 Mg Tabs (Simvastatin) .Marland Kitchen... As directed  Labs Reviewed: SGOT: 26 (07/12/2008)   SGPT: 32 (07/12/2008)   HDL:34.80 (07/11/2008), 38.3 (05/31/2008)  LDL:77 (07/11/2008), DEL (13/11/6576)  Chol:139 (07/11/2008), 260 (05/31/2008)  Trig:137.0 (07/11/2008), 162 (05/31/2008)  Problem # 4:  HYPERTENSION (ICD-401.9) BP continue to be slightly elevated  benazepril  and HCTZ were discontinue approximately November 2009 due to increasing creatinine BP  not well controlled since ----> restart low-dose Benazepril  see instructions , no charge for nurse visit  His updated medication list for this problem includes:    Amlodipine Besylate 10 Mg Tabs (Amlodipine besylate) .Marland Kitchen... 1 by mouth once daily    Carvedilol 12.5 Mg Tabs (Carvedilol) .Marland Kitchen... 1 by mouth two times a day    Doxazosin Mesylate 2 Mg Tabs (Doxazosin mesylate) .Marland Kitchen... 1 by mouth at bedtime    Benazepril Hcl 10 Mg Tabs (Benazepril hcl) .Marland Kitchen... 1 by mouth once daily  BP today: 160/84 Prior BP: 160/84 (07/11/2008)  Labs Reviewed: K+: 4.4 (07/12/2008) Creat: : 1.22 (07/12/2008)   Chol: 139 (07/11/2008)   HDL: 34.80 (07/11/2008)   LDL: 77 (07/11/2008)   TG: 137.0 (07/11/2008)  Problem # 5:  DIABETES MELLITUS, TYPE II (ICD-250.00)  per Dr Roanna Raider admits to poor diet, printed material provided regards diet\offered nutritionist referal ---will do His updated medication list for this problem includes:    Baby Aspirin 81 Mg Chew (Aspirin)    Actoplus Met 15-500 Mg Tabs (Pioglitazone hcl-metformin hcl) .Marland Kitchen... Take  one tablet by mouth daily    Humalog Mix 75/25 Kwikpen 75-25 % Susp (Insulin lispro prot & lispro) .Marland KitchenMarland KitchenMarland KitchenMarland Kitchen 30 u in am 15 u in pm    Benazepril Hcl 10 Mg Tabs (Benazepril hcl) .Marland Kitchen... 1 by mouth once daily  Orders: Nutrition Referral (Nutrition)  Complete Medication List: 1)  Baby Aspirin 81 Mg Chew (Aspirin) 2)  Actoplus Met 15-500 Mg Tabs (Pioglitazone hcl-metformin hcl) .... Take one tablet by mouth daily 3)  Colchicine 0.6 Mg Tabs (Colchicine) .... Take one tablet four times daily. 4)  Humalog Mix 75/25 Kwikpen 75-25 % Susp (Insulin lispro prot & lispro) .... 30 u in am 15  u in pm 5)  Mvi  6)  Fish Oil  .... Restart 04-26-08 7)  Amlodipine Besylate 10 Mg Tabs (Amlodipine besylate) .Marland Kitchen.. 1 by mouth once daily 8)  Carvedilol 12.5 Mg Tabs (Carvedilol) .Marland Kitchen.. 1 by mouth two times a day 9)  Vicodin 5-500 Mg Tabs (Hydrocodone-acetaminophen) .Marland Kitchen.. 1 or 2 by mouth q 6 hours 10)  Simvastatin 80 Mg Tabs (Simvastatin) .... As directed 11)  Doxazosin Mesylate 2 Mg Tabs (Doxazosin mesylate) .Marland Kitchen.. 1 by mouth at bedtime 12)  Benazepril Hcl 10 Mg Tabs (Benazepril hcl) .Marland Kitchen.. 1 by mouth once daily  Other Orders: Flu Vaccine 61yrs + ([address]) Administration Flu vaccine - MCR (U0454)  Patient Instructions: 1)  restart benazepril  2)  Nurse visit in two weeks for BP check and BMP Dx HTN 3)  vitamin D 600 units daily 4)  Please schedule a follow-up appointment in 4 months .  Prescriptions: BENAZEPRIL HCL 10 MG TABS (BENAZEPRIL HCL) 1 by mouth once daily  #30 x 1   Entered and Authorized by:   Elita Quick E. Liliana Brentlinger MD   Signed by:   Nolon Rod. Sudais Banghart MD on 01/10/2009   Method used:   Print then Give to Patient   RxID:   [id number]    Immunization History:  Tetanus/Td Immunization History:    Tetanus/Td:  given (04/10/1999)  Influenza Immunization History:    Influenza:  Fluvax 3+ (01/10/2009)  Pneumovax Immunization History:    Pneumovax:  Pneumovax (Medicare) (07/11/2008)    Social History:    Reviewed  history from 07/11/2008 and no changes required:       Married       retired       2 children       Regular exercise-yes     Habits & Providers     Endocrinologist: doerr

## 2010-05-09 NOTE — Progress Notes (Signed)
Summary: LAB RESULTS  Phone Note Outgoing Call Call back at [facility] Phone [phone] Call back at Work Phone [phone]   Details for Reason: LAB RESULTS: call patient labs ok except for the LFTs that are slightly  elevated for the first time hold simvastatin x 3 weeks, avoid tylenol and excessive ETOH recheck LFTs in 3 weeks Signed by [facility] E. Paz MD on 03/14/2008 at 10:43 AM Summary of Call: discussed with patient .Marland KitchenMarland KitchenMarland KitchenShary Johns  March 15, 2008 11:30 AM

## 2010-05-09 NOTE — Procedures (Signed)
Summary: planning a Cscope  [facility]   Imported By: Lanelle Bal 06/15/2009 14:30:09  _____________________________________________________________________  External Attachment:    Type:   Image     Comment:   External Document

## 2010-05-09 NOTE — Progress Notes (Signed)
Phone Note Refill Request Message from:  Patient  Refills Requested: Medication #1:  ACTOPLUS MET 15-500 MG  TABS TAKE ONE TABLET BY MOUTH DAILY  Medication #2:  HUMALOG MIX 75/25 KWIKPEN 75-25 %  SUSP 30 U IN AM 15 U IN PM  Medication #3:  CARVEDILOL 12.5 MG TABS 1 by mouth two times a day  Medication #4:  AMLODIPINE BESYLATE 10 MG TABS 1 by mouth once daily spoke with pt who has appt 2.25.11, Dr Joellyn Quails has been his endocrinolgist but have gone to [place], pt says he wants Dr Drue Novel to handle his BS and meds, [facility] pt [facility] but will hold off  on BS meds until OV. pt agreed  rx faxed to [phone]  Initial call taken by: Kandice Hams,  May 17, 2009 4:07 PM    New/Updated Medications: BD PEN NEEDLE SHORT U/F 31G X 8 MM MISC (INSULIN PEN NEEDLE) pt test  two times a day dx 250.00 Prescriptions: AMLODIPINE BESYLATE 10 MG TABS (AMLODIPINE BESYLATE) 1 by mouth once daily  #90 x 0   Entered by:   Kandice Hams   Authorized by:   Nolon Rod. Paz MD   Signed by:   Kandice Hams on 05/17/2009   Method used:   Reprint   RxID:   [id number] BD PEN NEEDLE SHORT U/F 31G X 8 MM MISC (INSULIN PEN NEEDLE) pt test  two times a day dx 250.00  #300 x 0   Entered by:   Kandice Hams   Authorized by:   Nolon Rod. Paz MD   Signed by:   Kandice Hams on 05/17/2009   Method used:   Printed then faxed to ...       [facility] ([facility])       [address]       [place], [place]  [id number]       Ph: [phone]       Fax: [phone]   RxID:   [id number] COLCRYS 0.6 MG TABS (COLCHICINE) 1 by mouth every 6 hours prn Brand medically necessary #120 x 0   Entered by:   Kandice Hams   Authorized by:   Nolon Rod. Paz MD   Signed by:   Kandice Hams on 05/17/2009   Method used:   Printed then faxed to ...       [facility] (mail[facility])       [address]       [place], [place]  [id number]       Ph: [phone]       Fax: [phone]   RxID:    [id number] BENAZEPRIL HCL 20 MG TABS (BENAZEPRIL HCL) 1 by mouth qd  #90 x 0   Entered by:   Kandice Hams   Authorized by:   Nolon Rod. Paz MD   Signed by:   Kandice Hams on 05/17/2009   Method used:   Printed then faxed to ...       [facility] ([facility])       [address]       [place], [place]  [id number]       Ph: [phone]       Fax: [phone]   RxID:   [id number] DOXAZOSIN MESYLATE 2 MG TABS (DOXAZOSIN MESYLATE) 1 by mouth at bedtime  #90 x 0   Entered by:   Kandice Hams   Authorized by:   Nolon Rod. Paz MD   Signed by:  Kandice Hams on 05/17/2009   Method used:   Printed then faxed to ...       [facility] ([facility])       [address]       [place], [place]  [id number]       Ph: [phone]       Fax: [phone]   RxID:   [id number] SIMVASTATIN 80 MG TABS (SIMVASTATIN) as directed  #90 x 0   Entered by:   Kandice Hams   Authorized by:   Nolon Rod. Paz MD   Signed by:   Kandice Hams on 05/17/2009   Method used:   Printed then faxed to ...       [facility] ([facility])       [address]       [place], [place]  [id number]       Ph: [phone]       Fax: [phone]   RxID:   [id number] CARVEDILOL 12.5 MG TABS (CARVEDILOL) 1 by mouth two times a day  #180 x 0   Entered by:   Kandice Hams   Authorized by:   Nolon Rod. Paz MD   Signed by:   Kandice Hams on 05/17/2009   Method used:   Printed then faxed to ...       [facility] ([facility])       [address]       [place], [place]  [id number]       Ph: [phone]       Fax: [phone]   RxID:   [id number] AMLODIPINE BESYLATE 10 MG TABS (AMLODIPINE BESYLATE) 1 by mouth once daily  #90 x 0   Entered by:   Kandice Hams   Authorized by:   Nolon Rod. Paz MD   Signed by:   Kandice Hams on 05/17/2009   Method used:   Printed then faxed to ...       [facility] (mail[facility])       [address]       [place], [place]   [id number]       Ph: [phone]       Fax: [phone]   RxID:   [id number]

## 2010-05-09 NOTE — Progress Notes (Signed)
Summary: Refill request   Phone Note Refill Request Message from:  Fax from Pharmacy on May 01, 2009 2:30 PM  Refills Requested: Medication #1:  COLCRYS 0.6 MG TABS 1 by mouth every 6 hours prn [BMN].   Dosage confirmed as above?Dosage Confirmed Refill Request to [facility] on [address] in [employer]  Next Appointment Scheduled: 05/28/09 Initial call taken by: Michaelle Copas,  May 01, 2009 2:31 PM    Prescriptions: COLCRYS 0.6 MG TABS (COLCHICINE) 1 by mouth every 6 hours prn Brand medically necessary #120 x 1   Entered by:   Kandice Hams   Authorized by:   Nolon Rod. Paz MD   Signed by:   Kandice Hams on 05/02/2009   Method used:   Faxed to ...       [facility]  [address] [phone]* (retail)       [address] [place], [place]  [address]       Ph: [phone] or [phone]       Fax: [phone]   RxID:   [id number]

## 2010-05-09 NOTE — Medication Information (Signed)
Summary: Clarification for [facility]  Clarification for [facility]   Imported By: Lanelle Bal 05/24/2009 12:04:39  _____________________________________________________________________  External Attachment:    Type:   Image     Comment:   External Document

## 2010-05-09 NOTE — Assessment & Plan Note (Signed)
Summary: cpx & lab.cbs   Vital Signs:  Patient Profile:   76 Years Old Male Height:     68 inches Weight:      189.50 pounds Temp:     97.9 degrees F oral Pulse rate:   72 / minute Resp:     16 per minute BP sitting:   138 / 74  (right arm)  Pt. in pain?   no  Vitals Entered By: Ardyth Man (November 27, 2006 9:28 AM)               Vision Screening: Left eye with correction: 20 / 20 Right eye with correction: 20 / 20 Both eyes with correction: 20 / 20  Vision Entered By: Ardyth Man (November 27, 2006 9:29 AM)    Preventive Care Screening  Last Tetanus Booster:    Date:  04/10/1999    Next Due:  04/2009    Results:  given   Colonoscopy:    Next Due:  09/2013  Last Pneumovax:    Date:  10/20/1999    Results:  given      Patient reports his colonoscopy was normal   Chief Complaint:  CPX and Labs.  General Medical HPI:      His prior problems were reviewed.    His list of current medications were reviewed today.  He is doing well at this time and is not having any significant new complaints.    Past Surgical History:    toe surgery   Family History:    Family History Diabetes 1st degree relative    Family History Liver disease    Family History of CAD Male 1st degree relative <50  Social History:    Occupation:    Married    Former Smoker    Alcohol use-no    Drug use-no    Regular exercise-yes   Risk Factors:  Tobacco use:  quit Drug use:  no Alcohol use:  no Exercise:  yes   Review of Systems  The patient denies anorexia, fever, weight loss, chest pain, dyspnea on exhertion, and depression.     Physical Exam  General:     alert, well-developed, and overweight-appearing.   Head:     Normocephalic and atraumatic without obvious abnormalities.  Eyes:     No corneal or conjunctival inflammation noted. EOMI. Perrla. Funduscopic exam benign, without hemorrhages, exudates or papilledema. Vision grossly normal. Ears:  External ear exam shows no significant lesions or deformities.  Otoscopic examination reveals clear canals, tympanic membranes are intact bilaterally without bulging, retraction, inflammation or discharge. Hearing is grossly normal bilaterally. Nose:     External nasal examination shows no deformity or inflammation. Nasal mucosa are pink and moist without lesions or exudates. Mouth:     Oral mucosa and oropharynx without lesions or exudates.  Teeth in good repair. Abdomen:     Bowel sounds positive,abdomen soft and non-tender without masses, organomegaly or hernias noted. Rectal:     No external abnormalities noted. Normal sphincter tone. No rectal masses or tenderness. Genitalia:     Testes bilaterally descended without nodularity, tenderness or masses. No scrotal masses or lesions. No penis lesions or urethral discharge. Prostate:     no nodules, no asymmetry, no induration, and 1+ enlarged.   Msk:     No deformity or scoliosis noted of thoracic or lumbar spine.   Pulses:     R and L carotid,radial ,dorsalis pedis and posterior tibial pulses are full and equal bilaterally Extremities:  No clubbing, cyanosis, edema, or deformity noted with normal full range of motion of all joints.   Neurologic:     No cranial nerve deficits noted. Station and gait are normal. Plantar reflexes are down-going bilaterally. DTRs are symmetrical throughout. Sensory, motor and coordinative functions appear intact.    Impression & Recommendations:  Problem # 1:  Preventive Health Care (ICD-V70.0) 1. Health promotion and screening reviewed 2. Patient info provided   Reviewed preventive care protocols, scheduled due services, and updated immunizations.   Problem # 2:  HYPERTENSION (ICD-401.9) Patient wanted to change to generic medication for Micardis. Patient provied lists of options from his insurance company. Reviewed side effects F/u 2 weeks Monitor blood pressure. His updated medication list  for this problem includes:    Caduet 10-80 Mg Tabs (Amlodipine-atorvastatin) .Marland Kitchen... 1 by mouth qd    Benazepril-hydrochlorothiazide 20-12.5 Mg Tabs (Benazepril-hydrochlorothiazide) .Marland Kitchen... 1 by mouth qd  Orders: TLB-BMP (Basic Metabolic Panel-BMET) (80048-METABOL)  BP today: 138/74 Prior BP: 122/70 (10/06/2006)  Labs Reviewed: Creat: 1.2 (10/06/2006) Chol: 106 (10/06/2006)   HDL: 28.9 (10/06/2006)   LDL: 43 (10/06/2006)   TG: 173 (10/06/2006)   Complete Medication List: 1)  Caduet 10-80 Mg Tabs (Amlodipine-atorvastatin) .Marland Kitchen.. 1 by mouth qd 2)  Baby Aspirin 81 Mg Chew (Aspirin) 3)  Benazepril-hydrochlorothiazide 20-12.5 Mg Tabs (Benazepril-hydrochlorothiazide) .Marland Kitchen.. 1 by mouth qd 4)  Actoplus Met 15-500 Mg Tabs (Pioglitazone hcl-metformin hcl) .... Take one tablet by mouth daily 5)  Zetia 10 Mg Tabs (Ezetimibe) 6)  Colchicine 0.6 Mg Tabs (Colchicine) .... Take one tablet four times daily. 7)  Humalog Mix 75/25 Kwikpen 75-25 % Susp (Insulin lispro prot & lispro)  Other Orders: TLB-PSA (Prostate Specific Antigen) (84153-PSA)   Patient Instructions: 1)  Please schedule a follow-up appointment in 2 weeks.Marland Kitchen 2)  Check your Blood Pressure regularly. If it is above:130/80 you should make an appointment. 3)  It is important that you exercise regularly at least 20 minutes 5 times a week. If you develop chest pain, have severe difficulty breathing, or feel very tired , stop exercising immediately and seek medical attention. 4)  Consider one time abdominal aorta ultrasound to rule out aortic aneursym.    Prescriptions: BENAZEPRIL-HYDROCHLOROTHIAZIDE 20-12.5 MG  TABS (BENAZEPRIL-HYDROCHLOROTHIAZIDE) 1 by mouth qd  #30 x 1   Entered and Authorized by:   Leanne Chang MD   Signed by:   Leanne Chang MD on 11/27/2006   Method used:   Print then Give to Patient   RxID:   [id number]

## 2010-05-09 NOTE — Letter (Signed)
Summary: [facility]  [facility]   Imported By: Freddy Jaksch 07/13/2007 09:49:27  _____________________________________________________________________  External Attachment:    Type:   Image     Comment:   External Document

## 2010-05-09 NOTE — Assessment & Plan Note (Signed)
Summary: ROA 2 MONTHS,CBS   Vital Signs:  Patient Profile:   76 Years Old Male Height:     68 inches Weight:      183.38 pounds Temp:     98 degrees F oral Pulse rate:   72 / minute Resp:     14 per minute BP sitting:   122 / 70  (right arm)  Pt. in pain?   no  Vitals Entered By: Ardyth Man (October 06, 2006 8:02 AM)                Chief Complaint:  Two month follow up.  History of Present Illness:  Hypertension Follow-Up      This is a 77 year old man who presents for Hypertension follow-up.  The patient denies lightheadedness, urinary frequency, headaches, edema, and fatigue.  The patient denies the following associated symptoms: chest pain, chest pressure, exercise intolerance, dyspnea, palpitations, syncope, leg edema, and pedal edema.  The patient reports that dietary compliance has been fair.  The patient reports exercising daily.    Hyperlipidemia Follow-Up      The patient also presents for Hyperlipidemia follow-up.  The patient denies muscle aches, abdominal pain, diarrhea, and fatigue.  The patient denies the following symptoms: chest pain/pressure, exercise intolerance, dypsnea, syncope, and pedal edema.  Compliance with medications (by patient report) has been near 100%.  Dietary compliance has been good.  The patient reports exercising 3-4X per week.    Patient reports see saw Dr.Doerr last week. Per patient everything was okay. States has not had labs done for diabetes recently and thought we were to fax it to Dr.Doerr's office.  Addditionally patient wanted to know if its okay from him to have increase urination when he drink significant amount of water daily and before he goes to bed. Drink atleast 12-15 glassess of water a day easily. Denies any dysuria.States he just enjoys water.    Past Medical History:    COPD    Diabetes mellitus, type II    Gout    Hypertension    Hyperlipidemia      Physical Exam  General:     well-nourished and  well-hydrated.  well-nourished, well-hydrated, and overweight-appearing.   Lungs:     Normal respiratory effort, chest expands symmetrically. Lungs are clear to auscultation, no crackles or wheezes. Heart:     Normal rate and regular rhythm. S1 and S2 normal without gallop, murmur, click, rub or other extra sounds. Pulses:     R and L carotid,radial,femoral,dorsalis pedis and posterior tibial pulses are full and equal bilaterally Extremities:     No clubbing, cyanosis, edema, or deformity noted with normal full range of motion of all joints.      Impression & Recommendations:  Problem # 1:  HYPERTENSION (ICD-401.9) At goal His updated medication list for this problem includes:    Caduet 10-80 Mg Tabs (Amlodipine-atorvastatin) .Marland Kitchen... 1 by mouth qd    Micardis Hct 80-12.5 Mg Tabs (Telmisartan-hctz)  Orders: TLB-Lipid Panel (80061-LIPID) TLB-BMP (Basic Metabolic Panel-BMET) (80048-METABOL) TLB-ALT (SGPT) (84460-ALT) TLB-AST (SGOT) (84450-SGOT)  BP today: 122/70  Labs Reviewed: Creat: 1.0 (07/28/2006) Chol: 136 (07/28/2006)   HDL: 35.6 (07/28/2006)   LDL: 71 (07/28/2006)   TG: 145 (07/28/2006)  Orders: TLB-Lipid Panel (80061-LIPID) TLB-BMP (Basic Metabolic Panel-BMET) (80048-METABOL) TLB-ALT (SGPT) (84460-ALT) TLB-AST (SGOT) (84450-SGOT)   Problem # 2:  HYPERLIPIDEMIA (ICD-272.4) At goal His updated medication list for this problem includes:    Caduet 10-80 Mg Tabs (Amlodipine-atorvastatin) .Marland KitchenMarland KitchenMarland KitchenMarland Kitchen 1  by mouth qd    Zetia 10 Mg Tabs (Ezetimibe)  Labs Reviewed: Chol: 136 (07/28/2006)   HDL: 35.6 (07/28/2006)   LDL: 71 (07/28/2006)   TG: 145 (07/28/2006) SGOT: 30 (07/28/2006)   SGPT: 32 (07/28/2006)   Problem # 3:  DIABETES MELLITUS, TYPE II (ICD-250.00) Advise patient that Dr.Doerr would be following his diabetes has she has been doing. I  will go ahead and check a HgbA1c  today since he is here  and send copy to Dr. Oneita Kras office.She will adjust medicines accordingly Will  have [facility] call Dr. Oneita Kras office and advise of patient. His updated medication list for this problem includes:    Glimepiride 4 Mg Tabs (Glimepiride)    Baby Aspirin 81 Mg Chew (Aspirin)    Micardis Hct 80-12.5 Mg Tabs (Telmisartan-hctz)    Actos Tabs (Pioglitazone hcl tabs)  Orders: TLB-A1C / Hgb A1C (Glycohemoglobin) (83036-A1C)   Problem # 4:  URINARY FREQUENCY (ICD-788.41) Most likely due to water intake versus increase thrist secondary to uncontrolled diabetes. Encourage he doesn't drink fluid atleast 2 hr before bed time and voids prior to going to bed.  1. Check a UA 2.F/u if no improvement.  Medications Added to Medication List This Visit: 1)  Glimepiride 4 Mg Tabs (Glimepiride) 2)  Baby Aspirin 81 Mg Chew (Aspirin) 3)  Micardis Hct 80-12.5 Mg Tabs (Telmisartan-hctz) 4)  Actos Tabs (Pioglitazone hcl tabs) 5)  Zetia 10 Mg Tabs (Ezetimibe)   Patient Instructions: 1)  Please schedule a follow-up appointment in 3 months.     Laboratory Results   Urine Tests  Date/Time Recieved: ..................................................................[facility]  [facility] 06, 2006 9:14 AM   Routine Urinalysis   Color: yellow Appearance: Clear Glucose: >=1000   (Normal Range: Negative) Bilirubin: negative   (Normal Range: Negative) Ketone: negative   (Normal Range: Negative) Spec. Gravity: 1.015   (Normal Range: 1.003-1.035) Blood: negative   (Normal Range: Negative) pH: 5.0   (Normal Range: 5.0-8.0) Protein: negative   (Normal Range: Negative) Urobilinogen: 0.2   (Normal Range: 0-1) Nitrite: negative   (Normal Range: Negative) Leukocyte Esterace: negative   (Normal Range: Negative)    Comments: please see glucose       Appended Document: ROA 2 MONTHS,CBS Advise patient urine show high level of Sugar. Most likely reason for increase thirst and urinary frequency. We have contacted Dr. Oneita Kras office and will be faxing results as well as HgbA1c when we obtain  it. Dr. Oneita Kras office will most likely be contacting him soon.

## 2010-05-09 NOTE — Assessment & Plan Note (Signed)
Summary: --ro4--recheck lft//ph   Vital Signs:  Patient Profile:   76 Years Old Male Height:     68 inches Weight:      191 pounds Pulse rate:   60 / minute Pulse rhythm:   regular BP sitting:   132 / 74  (left arm) Cuff size:   large  Vitals Entered By: Shary Decamp (April 18, 2008 12:50 PM)                 Chief Complaint:  rov; ?arthritis; c/o of pain in elbows, wrist, and ankles.  History of Present Illness: ROV ?arthritis, c/o of pain in elbows=wrist=knees=ankles. Sx started aprox 1 year recently moved to a smaller house but symptoms are better when he is more active, symptoms increase at night. uses vicodin prn Diabetes--no recent CBGs Hypertension--no recent ambulatory BPs Hyperlipidemia-- off simvastatin and fish oil due to elevated LFTs. At the time of last LFTs ----> he was not drinking ETOH and rarely taked tylenol        Current Allergies (reviewed today): No known allergies   Past Medical History:    Reviewed history from 03/09/2008 and no changes required:       COPD       Diabetes mellitus, type II       Gout       Hypertension       Hyperlipidemia       SLEEP APNEA        Osteoarthritis  Past Surgical History:    Reviewed history from 11/27/2006 and no changes required:       toe surgery   Social History:    Reviewed history from 06/30/2007 and no changes required:       Married       Former Smoker       Alcohol use-no       Drug use-no       Regular exercise-yes   Risk Factors: Tobacco use:  quit Drug use:  no Alcohol use:  no Exercise:  yes  Colonoscopy History:    Date of Last Colonoscopy:  09/13/2003   Review of Systems  General      Denies fever and weight loss.  Resp      Denies cough and wheezing.  GI      Denies abdominal pain and bloody stools.   Physical Exam  General:     alert and well-developed.   Lungs:     normal respiratory effort, no intercostal retractions, no accessory muscle use, and normal  breath sounds.   Heart:     normal rate, regular rhythm, no murmur, and no gallop.   Extremities:     wrists: slightly  puffy in the L, no warm or red Knees, no evidence of synovitis on physical exam Psych:     Cognition and judgment appear intact. Alert and cooperative with normal attention span and concentration.     Impression & Recommendations:  Problem # 1:  HYPERLIPIDEMIA (ICD-272.4) off simvastatin and fish oil due to elevated LFTs. At the time of last LFTs ----> no ETOH and rarely taked tylenol if labs ok, reintroduce statins and recheck LFTs His updated medication list for this problem includes:    Simvastatin 80 Mg Tabs (Simvastatin) ..... Holding - elevated lft's  Orders: Venipuncture (09811) TLB-Hepatic/Liver Function Pnl (80076-HEPATIC)   Problem # 2:  OSTEOARTHRITIS (ICD-715.90) multiple joint aches, on exam L wrist is slightly  puffy; XR L foot 11-09 showed OA symptoms likely from  OA, check labs, cont. vicodin, avoid NSAIDs due to increase in creatinine last year His updated medication list for this problem includes:    Baby Aspirin 81 Mg Chew (Aspirin)    Vicodin 5-500 Mg Tabs (Hydrocodone-acetaminophen) .Marland Kitchen... 1 or 2 by mouth q 6 hours  Orders: TLB-Rheumatoid Factor (RA) (16109-UE) T-Antinuclear Antib (ANA) (45409-81191) TLB-Sedimentation Rate (ESR) (85652-ESR)   Problem # 3:  DIABETES MELLITUS, TYPE II (ICD-250.00) per Dr Roanna Raider check microalb His updated medication list for this problem includes:    Baby Aspirin 81 Mg Chew (Aspirin)    Actoplus Met 15-500 Mg Tabs (Pioglitazone hcl-metformin hcl) .Marland Kitchen... Take one tablet by mouth daily    Humalog Mix 75/25 Kwikpen 75-25 % Susp (Insulin lispro prot & lispro) .Marland KitchenMarland KitchenMarland KitchenMarland Kitchen 30 u in am 15 u in pm  Orders: TLB-Microalbumin/Creat Ratio, Urine (82043-MALB)   Problem # 4:  WELL ADULT EXAM (ICD-V70.0) due for yearly see instructions   Problem # 5:  HYPERTENSION (ICD-401.9)  His updated medication list for this  problem includes:    Amlodipine Besylate 10 Mg Tabs (Amlodipine besylate) .Marland Kitchen... 1 by mouth once daily    Carvedilol 12.5 Mg Tabs (Carvedilol) .Marland Kitchen... 1 by mouth two times a day  BP today: 132/74 Prior BP: 130/80 (03/09/2008)  Labs Reviewed: Creat: 1.3 (03/09/2008) Chol: 106 (10/06/2006)   HDL: 28.9 (10/06/2006)   LDL: 43 (10/06/2006)   TG: 173 (10/06/2006)   Complete Medication List: 1)  Baby Aspirin 81 Mg Chew (Aspirin) 2)  Actoplus Met 15-500 Mg Tabs (Pioglitazone hcl-metformin hcl) .... Take one tablet by mouth daily 3)  Colchicine 0.6 Mg Tabs (Colchicine) .... Take one tablet four times daily. 4)  Humalog Mix 75/25 Kwikpen 75-25 % Susp (Insulin lispro prot & lispro) .... 30 u in am 15 u in pm 5)  Mvi  6)  Fish Oil  .... Holding elevated lfts 7)  Simvastatin 80 Mg Tabs (Simvastatin) .... Holding - elevated lft's 8)  Amlodipine Besylate 10 Mg Tabs (Amlodipine besylate) .Marland Kitchen.. 1 by mouth once daily 9)  Carvedilol 12.5 Mg Tabs (Carvedilol) .Marland Kitchen.. 1 by mouth two times a day 10)  Vicodin 5-500 Mg Tabs (Hydrocodone-acetaminophen) .Marland Kitchen.. 1 or 2 by mouth q 6 hours   Patient Instructions: 1)  Please schedule a YEARLY  in 3 months, fasting    Prescriptions: VICODIN 5-500 MG TABS (HYDROCODONE-ACETAMINOPHEN) 1 or 2 by mouth q 6 hours  #60 x 0   Entered and Authorized by:   Nolon Rod. Richie Bonanno MD   Signed by:   Nolon Rod. Justinn Welter MD on 04/18/2008   Method used:   Print then Give to Patient   RxID:   [id number]

## 2010-05-09 NOTE — Assessment & Plan Note (Signed)
Summary: 2 month ov//PH   Vital Signs:  Patient Profile:   76 Years Old Male Height:     68 inches Weight:      196 pounds Pulse rate:   72 / minute BP sitting:   142 / 80  Vitals Entered By: Shary Decamp (Sep 01, 2007 8:16 AM)             Comments patient is finishing his samples of vytorin -- then he will get prescription for simvastatin prescription filled........Marland KitchenShary Decamp  Sep 01, 2007 8:22 AM      Chief Complaint:  rov - fasting.  History of Present Illness: ROV --patient is finishing his samples of vytorin -- then he will get prescription for simvastatin prescription filled --also had a cold --occ wrist soreness w/o redness or swelling    Prior Medications Reviewed Using: List Brought by Patient  Updated Prior Medication List: BABY ASPIRIN 81 MG  CHEW (ASPIRIN)  BENAZEPRIL-HYDROCHLOROTHIAZIDE 20-25 MG  TABS (BENAZEPRIL-HYDROCHLOROTHIAZIDE) 1 by mouth qd ACTOPLUS MET 15-500 MG  TABS (PIOGLITAZONE HCL-METFORMIN HCL) TAKE ONE TABLET BY MOUTH DAILY ZETIA 10 MG  TABS (EZETIMIBE) Take one tablet daily COLCHICINE 0.6 MG  TABS (COLCHICINE) Take one tablet four times daily. HUMALOG MIX 75/25 KWIKPEN 75-25 %  SUSP (INSULIN LISPRO PROT & LISPRO) 30 U IN AM 15 U IN PM NORVASC 10 MG  TABS (AMLODIPINE BESYLATE) Take one tablet daily * MVI  * FISH OIL  SIMVASTATIN 80 MG  TABS (SIMVASTATIN) 1 by mouth qhs  Current Allergies (reviewed today): No known allergies   Past Medical History:    Reviewed history from 06/30/2007 and no changes required:       COPD       Diabetes mellitus, type II       Gout       Hypertension       Hyperlipidemia       SLEEP APNEA             Review of Systems  General      exercising more  CV      Denies chest pain or discomfort and swelling of feet.  Resp      Denies shortness of breath.  Psych      not sleeping well x years denies  any unusual stress   Physical Exam  General:     alert and well-developed.    Lungs:     normal respiratory effort, no intercostal retractions, no accessory muscle use, and normal breath sounds.   Heart:     normal rate, regular rhythm, and no murmur.   Extremities:     wrists B: nl    Impression & Recommendations:  Problem # 1:  Labs from Dr Roanna Raider reviewed 08-19-07  ALT nl, A1c 7.8, Ca nl, K 4.3, Cr 1.4 TC 99, TG 112, HDL 30, LDL 46  Problem # 2:  HYPERLIPIDEMIA (ICD-272.4) results on Vytoryn 10/80: 08-19-07  ALT nl, TC 99, TG 112, HDL 30, LDL 46 good results cost is a issue needs simvastatin and  Zetia   If can't afford zetia, start fenofibrate (Rx issued) His updated medication list for this problem includes:    Zetia 10 Mg Tabs (Ezetimibe) .Marland Kitchen... Take one tablet daily    Simvastatin 80 Mg Tabs (Simvastatin) .Marland Kitchen... 1 by mouth qhs    Fenofibrate 54 Mg Tabs (Fenofibrate) .Marland Kitchen... 1 by mouth once daily   Problem # 3:  DIABETES MELLITUS, TYPE II (ICD-250.00) per Dr Roanna Raider 08-19-07  A1c 7.8, His updated medication list for this problem includes:    Baby Aspirin 81 Mg Chew (Aspirin)    Benazepril-hydrochlorothiazide 20-25 Mg Tabs (Benazepril-hydrochlorothiazide) .Marland Kitchen... 1 by mouth qd    Actoplus Met 15-500 Mg Tabs (Pioglitazone hcl-metformin hcl) .Marland Kitchen... Take one tablet by mouth daily    Humalog Mix 75/25 Kwikpen 75-25 % Susp (Insulin lispro prot & lispro) .Marland KitchenMarland KitchenMarland KitchenMarland Kitchen 30 u in am 15 u in pm   Problem # 4:  HYPERTENSION (ICD-401.9) 08-19-07   Ca nl, K 4.3, Cr 1.4 we are already adjusting his cholesterol medicine will adjust his BP meds on RTC if needed low salt diet he also will check BP at home 1 x weeks andcall if consistently > 130/85 His updated medication list for this problem includes:    Benazepril-hydrochlorothiazide 20-25 Mg Tabs (Benazepril-hydrochlorothiazide) .Marland Kitchen... 1 by mouth qd    Norvasc 10 Mg Tabs (Amlodipine besylate) .Marland Kitchen... Take one tablet daily  BP today: 142/80 Prior BP: 142/80 (06/30/2007)  Labs Reviewed: Creat: 1.3 (02/17/2007) Chol: 106  (10/06/2006)   HDL: 28.9 (10/06/2006)   LDL: 43 (10/06/2006)   TG: 173 (10/06/2006)   Complete Medication List: 1)  Baby Aspirin 81 Mg Chew (Aspirin) 2)  Benazepril-hydrochlorothiazide 20-25 Mg Tabs (Benazepril-hydrochlorothiazide) .Marland Kitchen.. 1 by mouth qd 3)  Actoplus Met 15-500 Mg Tabs (Pioglitazone hcl-metformin hcl) .... Take one tablet by mouth daily 4)  Zetia 10 Mg Tabs (Ezetimibe) .... Take one tablet daily 5)  Colchicine 0.6 Mg Tabs (Colchicine) .... Take one tablet four times daily. 6)  Humalog Mix 75/25 Kwikpen 75-25 % Susp (Insulin lispro prot & lispro) .... 30 u in am 15 u in pm 7)  Norvasc 10 Mg Tabs (Amlodipine besylate) .... Take one tablet daily 8)  Mvi  9)  Fish Oil  10)  Simvastatin 80 Mg Tabs (Simvastatin) .Marland Kitchen.. 1 by mouth qhs 11)  Fenofibrate 54 Mg Tabs (Fenofibrate) .Marland Kitchen.. 1 by mouth once daily   Patient Instructions: 1)  take simvastatin 1 by mouth once daily  2)  also you need Zetia once aday 3)  IF cost is a issue, then take Fenofibrate INSTEAD OF zetia 4)  Please schedule a follow-up appointment in 4 months.   Prescriptions: SIMVASTATIN 80 MG  TABS (SIMVASTATIN) 1 by mouth qhs  #30 x 6   Entered and Authorized by:   Elita Quick E. Gabriela Giannelli MD   Signed by:   Nolon Rod. Enslie Sahota MD on 09/01/2007   Method used:   Print then Give to Patient   RxID:   [id number] ZETIA 10 MG  TABS (EZETIMIBE) Take one tablet daily  #30 x 6   Entered and Authorized by:   Nolon Rod. Lanson Randle MD   Signed by:   Nolon Rod. Derel Mcglasson MD on 09/01/2007   Method used:   Print then Give to Patient   RxID:   [id number] FENOFIBRATE 54 MG  TABS (FENOFIBRATE) 1 by mouth once daily  #30 x 6   Entered and Authorized by:   Elita Quick E. Tyniah Kastens MD   Signed by:   Nolon Rod. Violet Cart MD on 09/01/2007   Method used:   Print then Give to Patient   RxID:   [id number]  ]

## 2010-05-09 NOTE — Assessment & Plan Note (Signed)
Summary: 3 month f/u//lch   Vital Signs:  Patient profile:   76 year old male Height:      68 inches Weight:      194.25 pounds BMI:     29.64 Pulse rate:   58 / minute BP sitting:   140 / 80  Vitals Entered By: Kandice Hams (September 05, 2009 10:50 AM) CC: 3 month followup Comments will need refills on all meds to [email or web address].Kandice Hams  September 05, 2009 10:51 AM    History of Present Illness: routine office visit, multiple questions, see below  Diabetes --per endocrinology, no recent ambulatory CBGs, had an eye exam in July 2010 Gout--taking colchicine twice a day, not on allopurinol . Wonders if that is the right thing to do Hypertension--wonders why he needs to take 4 medicines for BP. Ambulatory BP is around 140/80 Hyperlipidemia--good medication compliance, also concerned about  "taking too many medicines"   c/o  cough-- cough is dry, daily, usually triggered by eating or talking. see review of systems  Allergies: No Known Drug Allergies  Past History:  Past Medical History: COPD Diabetes mellitus, type II Gout Hypertension Hyperlipidemia SLEEP APNEA  Osteoarthritis Benign prostatic hypertrophy  Past Surgical History: Reviewed history from 11/27/2006 and no changes required. toe surgery  Social History: Married retired 2 children Regular exercise--yes 3/week diet-- does not watch  former smoker, 1 ppd, quit 2000 aprox  Review of Systems ENT:  no postnasal dripping. Resp:  no sputum production Occasionally hears wheezing No short of breath. GI:  no GERD symptoms No choking with food.  Physical Exam  General:  alert and well-developed.   Lungs:  normal respiratory effort, no intercostal retractions, no accessory muscle use, and normal breath sounds.   Heart:  normal rate, regular rhythm, no murmur, and no gallop.   Pulses:  normal pedal pulses bilaterally Extremities:  no edema  Diabetes Management Exam:    Foot Exam (with socks and/or shoes  not present):       Sensory-Pinprick/Light touch:          Left medial foot (L-4): normal          Left dorsal foot (L-5): normal          Left lateral foot (S-1): normal          Right medial foot (L-4): normal          Right dorsal foot (L-5): normal          Right lateral foot (S-1): normal       Sensory-Monofilament:          Left foot: normal          Right foot: normal       Inspection:          Left foot: normal          Right foot: normal       Nails:          Left foot: too long          Right foot: too long   Impression & Recommendations:  Problem # 1:  GUAIAC POSITIVE STOOL (ICD-578.1) had a colonoscopy last month, had polyps  Problem # 2:  COUGH (ICD-786.2) cough for a few months History of COPD, occasional wheezing Also taking ACE inhibitors plan: Discontinue ACE inhibitors PFTs  Orders: Pulmonary Referral (Pulmonary)  Problem # 3:  HYPERTENSION (ICD-401.9) see #2, due to cough, will switch from benazepril to losartan His updated medication  list for this problem includes:    Carvedilol 12.5 Mg Tabs (Carvedilol) .Marland Kitchen... 1 by mouth two times a day    Doxazosin Mesylate 4 Mg Tabs (Doxazosin mesylate) .Marland Kitchen... 1 by mouth at bedtime    Losartan Potassium 50 Mg Tabs (Losartan potassium) ..... One tablet daily    Amlodipine Besylate 10 Mg Tabs (Amlodipine besylate) .Marland Kitchen... 1 by mouth once daily  Problem # 4:  COPD (ICD-496) see #2  carries a diagnosis of COPD We'll order PFTs  Orders: Pulmonary Referral (Pulmonary)  Problem # 5:  HYPERLIPIDEMIA (ICD-272.4) wonders why he needs to take so many medicines, I reviewed with the patient all the previous FLPs, discussed with him the need to take cholest.  medication His updated medication list for this problem includes:    Simvastatin 80 Mg Tabs (Simvastatin) .Marland Kitchen... As directed  Labs Reviewed: SGOT: 34 (05/28/2009)   SGPT: 34 (05/28/2009)   HDL:34.80 (07/11/2008), 38.3 (05/31/2008)  LDL:77 (07/11/2008), DEL  (05/31/2008)  Chol:139 (07/11/2008), 260 (05/31/2008)  Trig:137.0 (07/11/2008), 162 (05/31/2008)  Problem # 6:  GOUT (ICD-274.9) his last uric acid was 8.7, he's not on allopurinol and has never taken it He is currently taking colchicine twice a day, hardly ever has got episodes Recommend to: hold colchicine, take it  p.r.n. If gout episodes are  frequent, he will need allopurinol His updated medication list for this problem includes:    Colcrys 0.6 Mg Tabs (Colchicine) .Marland Kitchen... 1 by mouth every 6 hours prn  uric acid 8.7 on April 2010 on colchicine b.i.d. His updated medication list for this problem includes:    Colchicine 0.6 Mg Tabs (Colchicine) .Marland Kitchen... Take one tablet four times daily.  Problem # 7:  DIABETES MELLITUS, TYPE II (ICD-250.00) followup by endocrinology Last eye exam per patient July 2010 Feet care discussed  His updated medication list for this problem includes:    Losartan Potassium 50 Mg Tabs (Losartan potassium) ..... One tablet daily    Actoplus Met 15-500 Mg Tabs (Pioglitazone hcl-metformin hcl) .Marland Kitchen... Take one tablet by mouth daily    Humalog Mix 75/25 Kwikpen 75-25 % Susp (Insulin lispro prot & lispro) .Marland KitchenMarland KitchenMarland KitchenMarland Kitchen 40 u in pm    Baby Aspirin 81 Mg Chew (Aspirin)  Complete Medication List: 1)  Carvedilol 12.5 Mg Tabs (Carvedilol) .Marland Kitchen.. 1 by mouth two times a day 2)  Doxazosin Mesylate 4 Mg Tabs (Doxazosin mesylate) .Marland Kitchen.. 1 by mouth at bedtime 3)  Losartan Potassium 50 Mg Tabs (Losartan potassium) .... One tablet daily 4)  Amlodipine Besylate 10 Mg Tabs (Amlodipine besylate) .Marland Kitchen.. 1 by mouth once daily 5)  Simvastatin 80 Mg Tabs (Simvastatin) .... As directed 6)  Actoplus Met 15-500 Mg Tabs (Pioglitazone hcl-metformin hcl) .... Take one tablet by mouth daily 7)  Humalog Mix 75/25 Kwikpen 75-25 % Susp (Insulin lispro prot & lispro) .... 40 u in pm 8)  Baby Aspirin 81 Mg Chew (Aspirin) 9)  Mvi  10)  Fish Oil  .... Restart 04-26-08 11)  Vicodin 5-500 Mg Tabs  (Hydrocodone-acetaminophen) .Marland Kitchen.. 1 or 2 by mouth q 6 hours 12)  Colcrys 0.6 Mg Tabs (Colchicine) .Marland Kitchen.. 1 by mouth every 6 hours prn 13)  Bd Pen Needle Short U/f 31g X 8 Mm Misc (Insulin pen needle) .... Pt test  two times a day dx 250.00  Patient Instructions: 1)  stop benazepril, and start losartan 2)  Take colchicine every 3 hours only as needed if you have a gout episode 3)  come back fasting in one month for  a nurse visit: BP check, labs (BMP, AST, ALT and, FLP   dx HTN, high chol) 4)  Please schedule a follow-up appointment in 3 months ( yearly checkup)  Prescriptions: COLCRYS 0.6 MG TABS (COLCHICINE) 1 by mouth every 6 hours prn Brand medically necessary #120 x 3   Entered by:   Jeremy Johann CMA   Authorized by:   Nolon Rod. Shelanda Duvall MD   Signed by:   Jeremy Johann CMA on 09/05/2009   Method used:   Faxed to ...       [facility] (mail-order)             , [place]         Ph: [phone]       Fax: [phone]   RxID:   [id number] SIMVASTATIN 80 MG TABS (SIMVASTATIN) as directed  #90 x 3   Entered by:   Jeremy Johann CMA   Authorized by:   Nolon Rod. Anhar Mcdermott MD   Signed by:   Jeremy Johann CMA on 09/05/2009   Method used:   Faxed to ...       [facility] (mail-order)             , [place]         Ph: [phone]       Fax: [phone]   RxID:   [id number] AMLODIPINE BESYLATE 10 MG TABS (AMLODIPINE BESYLATE) 1 by mouth once daily  #90 x 3   Entered by:   Jeremy Johann CMA   Authorized by:   Nolon Rod. Merle Cirelli MD   Signed by:   Jeremy Johann CMA on 09/05/2009   Method used:   Faxed to ...       [facility] (mail-order)             , [place]         Ph: [phone]       Fax: [phone]   RxID:   [id number] DOXAZOSIN MESYLATE 4 MG TABS (DOXAZOSIN MESYLATE) 1 by mouth at bedtime  #90 x 3   Entered by:   Jeremy Johann CMA   Authorized by:   Nolon Rod. Shean Gerding MD   Signed by:   Jeremy Johann CMA on 09/05/2009   Method used:   Faxed to ...       [facility] (mail-order)             , [place]         Ph: [phone]       Fax: [phone]   RxID:   [id number] CARVEDILOL 12.5 MG TABS (CARVEDILOL) 1 by mouth two times a day  #180 x 3   Entered by:   Jeremy Johann CMA   Authorized by:   Nolon Rod. Marysa Wessner MD   Signed by:   Jeremy Johann CMA on 09/05/2009   Method used:   Faxed to ...       [facility] (mail-order)             , [place]         Ph: [phone]       Fax: [phone]   RxID:   [id number] LOSARTAN POTASSIUM 50 MG TABS (LOSARTAN POTASSIUM) one tablet daily  #30 x 3   Entered and Authorized by:   Nolon Rod. Toby Ayad MD   Signed by:   Nolon Rod. Ahmad Vanwey MD on 09/05/2009   Method used:   Print then Give to Patient   RxID:   [id number]

## 2010-05-09 NOTE — Letter (Signed)
Summary: [facility]   Imported By: Lanelle Bal 03/28/2010 09:55:56  _____________________________________________________________________  External Attachment:    Type:   Image     Comment:   External Document

## 2010-05-09 NOTE — Progress Notes (Signed)
Summary: refill  Phone Note Call from Patient Call back at [facility] Phone [phone]   Summary of Call: pt of dr. Blossom Hoops  refill on colchicine .6mg  #60 last refill on 11.3.06 pharm: [facility] on [address] in [place]   [phone] Initial call taken by: Freddy Jaksch,  October 16, 2006 2:44 PM    New/Updated Medications: COLCHICINE 0.6 MG  TABS (COLCHICINE) Take one tablet four times daily.  Prescriptions: COLCHICINE 0.6 MG  TABS (COLCHICINE) Take one tablet four times daily.  #120 x 0   Entered by:   Ardyth Man   Authorized by:   Leanne Chang MD   Signed by:   Ardyth Man on 10/16/2006   Method used:   Telephoned to ...         RxID:   [id number]

## 2010-05-09 NOTE — Consult Note (Signed)
Summary: [facility]  [facility]   Imported By: Lanelle Bal 06/11/2009 10:03:45  _____________________________________________________________________  External Attachment:    Type:   Image     Comment:   External Document

## 2010-05-09 NOTE — Assessment & Plan Note (Signed)
Summary: FASTING-BP AND BMP,AST,ALT,FLP,DX-HTN   Vital Signs:  Patient profile:   76 year old male Weight:      197 pounds Pulse rate:   75 / minute Pulse rhythm:   regular BP sitting:   128 / 82  (left arm) Cuff size:   large  Vitals Entered By: Army Fossa CMA (October 10, 2009 9:37 AM) CC: Follow up on BP meds, labwork.   Allergies (verified): No Known Drug Allergies  Past History:  Past Medical History: Reviewed history from 09/05/2009 and no changes required. COPD Diabetes mellitus, type II Gout Hypertension Hyperlipidemia SLEEP APNEA  Osteoarthritis Benign prostatic hypertrophy  Past Surgical History: Reviewed history from 11/27/2006 and no changes required. toe surgery  Social History: Reviewed history from 09/05/2009 and no changes required. Married retired 2 children Regular exercise--yes 3/week diet-- does not watch  former smoker, 1 ppd, quit 2000 aprox   Impression & Recommendations:  Problem # 1:  HYPERTENSION (ICD-401.9)  nurse visit tolerates new meds well labs  His updated medication list for this problem includes:    Carvedilol 12.5 Mg Tabs (Carvedilol) .Marland Kitchen... 1 by mouth two times a day    Doxazosin Mesylate 4 Mg Tabs (Doxazosin mesylate) .Marland Kitchen... 1 by mouth at bedtime    Losartan Potassium 50 Mg Tabs (Losartan potassium) ..... One tablet daily    Amlodipine Besylate 10 Mg Tabs (Amlodipine besylate) .Marland Kitchen... 1 by mouth once daily  BP today: 128/82 Prior BP: 140/80 (09/05/2009)  Labs Reviewed: K+: 4.0 (05/28/2009) Creat: : 1.5 (05/28/2009)   Chol: 139 (07/11/2008)   HDL: 34.80 (07/11/2008)   LDL: 77 (07/11/2008)   TG: 137.0 (07/11/2008)  Orders: Venipuncture (60454) TLB-BMP (Basic Metabolic Panel-BMET) (80048-METABOL) TLB-Lipid Panel (80061-LIPID) TLB-ALT (SGPT) (84460-ALT) TLB-AST (SGOT) (84450-SGOT)  Problem # 2:     Complete Medication List: 1)  Carvedilol 12.5 Mg Tabs (Carvedilol) .Marland Kitchen.. 1 by mouth two times a day 2)  Doxazosin  Mesylate 4 Mg Tabs (Doxazosin mesylate) .Marland Kitchen.. 1 by mouth at bedtime 3)  Losartan Potassium 50 Mg Tabs (Losartan potassium) .... One tablet daily 4)  Amlodipine Besylate 10 Mg Tabs (Amlodipine besylate) .Marland Kitchen.. 1 by mouth once daily 5)  Simvastatin 80 Mg Tabs (Simvastatin) .... As directed 6)  Actoplus Met 15-500 Mg Tabs (Pioglitazone hcl-metformin hcl) .... Take one tablet by mouth daily 7)  Humalog Mix 75/25 Kwikpen 75-25 % Susp (Insulin lispro prot & lispro) .... 40 u in pm 8)  Aspirin 81 Mg Tbec (Aspirin) 9)  Mvi  10)  Fish Oil  .... Restart 04-26-08 11)  Vicodin 5-500 Mg Tabs (Hydrocodone-acetaminophen) .Marland Kitchen.. 1 or 2 by mouth q 6 hours 12)  Colcrys 0.6 Mg Tabs (Colchicine) .Marland Kitchen.. 1 by mouth every 6 hours prn 13)  Bd Pen Needle Short U/f 31g X 8 Mm Misc (Insulin pen needle) .... Pt test  two times a day dx 250.00

## 2010-05-09 NOTE — Assessment & Plan Note (Signed)
Summary: yearly check/cbs   Vital Signs:  Patient profile:   76 year old male Height:      68 inches Weight:      201.25 pounds Pulse rate:   82 / minute Pulse rhythm:   regular BP sitting:   128 / 82  (left arm) Cuff size:   large  Vitals Entered By: Army Fossa CMA (January 11, 2010 8:34 AM) CC: CPX, fasting Comments Td & Flu shot [facility] [address]   History of Present Illness: Here for Medicare AWV:  1.   Risk factors based on Past M, S, F history:reviewed  2.   Physical Activities: some walking , rarely goes to the gym and use the treadmil 3.   Depression/mood: denies problems, mood seems well today 4.   Hearing: no complaints, no problems noted  5.   ADL's: totally independent  6.   Fall Risk: low risk, no h/o falls  7.   Home Safety: does feel safe at home  8.   Height, weight, &visual acuity: see VS, vision corrected , sees eye doctor routinely  9.   Counseling: yes , see below  10.   Labs ordered based on risk factors: yes  11.           Referral Coordination: if needed  12.           Care Plan: see a/p  13.            Cognitive Assessment , motor skills, cognition and memory seem appropriate  In addition, we discussed the following issues  Diabetes -- per endocrine, thinking about switching doctors Gout-- uses colcrys rarely , ?once a month Hypertension-- no ambulatory BPs , good medication compliance , trying to avoid excesive salt  Hyperlipidemia-- good medication compliance  Benign prostatic hypertrophy--  nocturia x 2 , apparently taking 1/2 doxazocin only COPD confirmed by PFTs 7-11   Allergies: No Known Drug Allergies  Past History:  Past Medical History: COPD Diabetes mellitus, type II Gout Hypertension Hyperlipidemia SLEEP APNEA  Osteoarthritis Benign prostatic hypertrophy  Past Surgical History: Reviewed history from 11/27/2006 and no changes required. toe surgery  Family History: Reviewed history from 07/11/2008 and no changes  required. colon ca--no prostate ca--no Diabetes-- sister  Liver disease--M CAD-- F  onset in his 54s   Social History: Married retired 2 children Regular exercise-- see HPI  diet--  no change since last year but trying to avoid excessive salt former smoker, 1 ppd, quit 2000 aprox  Review of Systems CV:  Denies chest pain or discomfort and swelling of feet. Resp:  Denies shortness of breath; occasionally coughs, no wheezing . GI:  Denies bloody stools, diarrhea, nausea, and vomiting. GU:  Denies dysuria and hematuria.  Physical Exam  General:  alert and well-developed.   Neck:  no masses and no thyromegaly.   Lungs:  normal respiratory effort, no intercostal retractions, no accessory muscle use, and slt decreased  breath sounds.   Heart:  normal rate, regular rhythm, no murmur, and no gallop.   Abdomen:  soft, non-tender, no distention, no masses, no guarding, and no rigidity.   Rectal:  No external abnormalities noted. Normal sphincter tone. No rectal masses or tenderness. Brown stools  Prostate:  Prostate gland firm and smooth,no  nodularity, tenderness, mass, asymmetry or induration. prostate is definitely enlarged  Extremities:  no pretibial edema bilaterally  Psych:  Oriented X3, memory intact for recent and remote, normally interactive, good eye contact, not  anxious appearing, and not depressed appearing.     Impression & Recommendations:  Problem # 1:  HEALTH SCREENING (ICD-V70.0)  chart reviewed  Td 01 and today  pneumonia shot 2001, and 2010 printed material provided regards shingles shot flu shot today   had a Cscope 5 or 6 years ago (Dr Loreta Ave), again Cscope 4-11, bx ----> tubular adenoma, next Cscope per GI  diet, exercise discussed   Orders: Medicare -1st Annual Wellness Visit [phone])  Problem # 2:  BENIGN PROSTATIC HYPERTROPHY (ICD-600.00)  continue with nocturia, taking only half doxazosin (reason?) Increase to one tablet daily DRE unchanged check  PSA  Orders: TLB-PSA (Prostate Specific Antigen) (84153-PSA) Specimen Handling (60454)  Problem # 3:  HYPERLIPIDEMIA (ICD-272.4)  His updated medication list for this problem includes:    Simvastatin 80 Mg Tabs (Simvastatin) .Marland Kitchen... As directed  Labs Reviewed: SGOT: 26 (10/10/2009)   SGPT: 25 (10/10/2009)   HDL:43.30 (10/10/2009), 34.80 (07/11/2008)  LDL:83 (10/10/2009), 77 (07/11/2008)  Chol:150 (10/10/2009), 139 (07/11/2008)  Trig:121.0 (10/10/2009), 137.0 (07/11/2008)  Problem # 4:  HYPERTENSION (ICD-401.9)  at goal   His updated medication list for this problem includes:    Carvedilol 12.5 Mg Tabs (Carvedilol) .Marland Kitchen... 1 by mouth two times a day    Doxazosin Mesylate 4 Mg Tabs (Doxazosin mesylate) .Marland Kitchen... 1 by mouth at bedtime    Losartan Potassium 50 Mg Tabs (Losartan potassium) ..... One tablet daily    Amlodipine Besylate 10 Mg Tabs (Amlodipine besylate) .Marland Kitchen... 1 by mouth once daily  BP today: 128/82 Prior BP: 128/82 (10/10/2009)  Labs Reviewed: K+: 4.6 (10/10/2009) Creat: : 1.3 (10/10/2009)   Chol: 150 (10/10/2009)   HDL: 43.30 (10/10/2009)   LDL: 83 (10/10/2009)   TG: 121.0 (10/10/2009)  Orders: Venipuncture (09811) TLB-CBC Platelet - w/Differential (85025-CBCD) TLB-TSH (Thyroid Stimulating Hormone) (84443-TSH) Specimen Handling (91478)  Problem # 5:  GOUT (ICD-274.9) well-controlled His updated medication list for this problem includes:    Colcrys 0.6 Mg Tabs (Colchicine) .Marland Kitchen... 1 by mouth every 6 hours prn    Problem # 6:  DIABETES MELLITUS, TYPE II (ICD-250.00) per endocrinology His updated medication list for this problem includes:    Losartan Potassium 50 Mg Tabs (Losartan potassium) ..... One tablet daily    Metformin Hcl 500 Mg Tabs (Metformin hcl) .Marland Kitchen..Marland Kitchen Two times a day    Humalog Mix 75/25 Kwikpen 75-25 % Susp (Insulin lispro prot & lispro) .Marland KitchenMarland KitchenMarland KitchenMarland Kitchen 40 u in pm    Aspirin 81 Mg Tbec (Aspirin)  Problem # 7:  COPD (ICD-496) documented by PFTs  7-11 Oligosymptomatic Prescribe  observation for now  Complete Medication List: 1)  Carvedilol 12.5 Mg Tabs (Carvedilol) .Marland Kitchen.. 1 by mouth two times a day 2)  Doxazosin Mesylate 4 Mg Tabs (Doxazosin mesylate) .Marland Kitchen.. 1 by mouth at bedtime 3)  Losartan Potassium 50 Mg Tabs (Losartan potassium) .... One tablet daily 4)  Amlodipine Besylate 10 Mg Tabs (Amlodipine besylate) .Marland Kitchen.. 1 by mouth once daily 5)  Simvastatin 80 Mg Tabs (Simvastatin) .... As directed 6)  Metformin Hcl 500 Mg Tabs (Metformin hcl) .... Two times a day 7)  Humalog Mix 75/25 Kwikpen 75-25 % Susp (Insulin lispro prot & lispro) .... 40 u in pm 8)  Aspirin 81 Mg Tbec (Aspirin) 9)  Mvi  10)  Fish Oil  .... Restart 04-26-08 11)  Vicodin 5-500 Mg Tabs (Hydrocodone-acetaminophen) .Marland Kitchen.. 1 or 2 by mouth q 6 hours 12)  Colcrys 0.6 Mg Tabs (Colchicine) .Marland Kitchen.. 1 by mouth every 6 hours prn 13)  Bd Pen Needle Short U/f 31g X 8 Mm Misc (Insulin pen needle) .... Pt test  two times a day dx 250.00  Other Orders: Flu Vaccine 57yrs + MEDICARE PATIENTS (Z6109) Administration Flu vaccine - MCR (G0008) Tdap => 85yrs IM (60454) Admin 1st Vaccine (09811)  Patient Instructions: 1)  Please schedule a follow-up appointment in 4 to 6  months .             Flu Vaccine Consent Questions     Do you have a history of severe allergic reactions to this vaccine? no    Any prior history of allergic reactions to egg and/or gelatin? no    Do you have a sensitivity to the preservative Thimersol? no    Do you have a past history of Guillan-Barre Syndrome? no    Do you currently have an acute febrile illness? no    Have you ever had a severe reaction to latex? no    Vaccine information given and explained to patient? yes    Are you currently pregnant? no    Lot Number:[id number]   Exp Date:10/05/2010   Site Given  Left Deltoid IMdflu   Immunizations Administered:  Tetanus Vaccine:    Vaccine Type: Tdap    Site: right deltoid    Mfr:  GlaxoSmithKline    Dose: 0.5 ml    Route: IM    Given by: Lucious Groves CMA    Exp. Date: 01/25/2012    Lot #: [id number]    VIS given: 02/23/08 version given January 11, 2010.

## 2010-05-09 NOTE — Medication Information (Signed)
[facility]: [facility]   Imported By: Freddy Jaksch 06/09/2007 12:33:06  _____________________________________________________________________  External Attachment:    Type:   Image     Comment:   External Document

## 2010-05-09 NOTE — Assessment & Plan Note (Signed)
Summary: CPX   Vital Signs:  Patient profile:   76 year old male Height:      68 inches Weight:      195.4 pounds BMI:     29.82 Pulse rate:   84 / minute Pulse rhythm:   regular BP sitting:   160 / 84  (left arm) Cuff size:   large  Vitals Entered By: Shary Decamp (July 11, 2008 7:57 AM) Comments yearly - fasting Shary Decamp  July 11, 2008 8:02 AM    History of Present Illness: yearly COPD, Dx years ago; has occ. DOE  (going  upstairs for instance) Diabetes -- per endocrinology Hypertension-- no  ambulatory BPs  Hyperlipidemia-- started simva, no s/e  Osteoarthritis-- to see rheumatology tomorrow, contw/ wrist pain   Preventive Screening-Counseling & Management     Alcohol drinks/day: <1     Smoking Status: quit     Does Patient Exercise: yes     Type of exercise: walk  Current Medications (verified): 1)  Baby Aspirin 81 Mg  Chew (Aspirin) 2)  Actoplus Met 15-500 Mg  Tabs (Pioglitazone Hcl-Metformin Hcl) .... Take One Tablet By Mouth Daily 3)  Colchicine 0.6 Mg  Tabs (Colchicine) .... Take One Tablet Four Times Daily. 4)  Humalog Mix 75/25 Kwikpen 75-25 %  Susp (Insulin Lispro Prot & Lispro) .... 30 U in Am 15 U in Pm 5)  Mvi 6)  Fish Oil .... Restart 04-26-08 7)  Amlodipine Besylate 10 Mg Tabs (Amlodipine Besylate) .Marland Kitchen.. 1 By Mouth Once Daily 8)  Carvedilol 12.5 Mg Tabs (Carvedilol) .Marland Kitchen.. 1 By Mouth Two Times A Day 9)  Vicodin 5-500 Mg Tabs (Hydrocodone-Acetaminophen) .Marland Kitchen.. 1 or 2 By Mouth Q 6 Hours 10)  Simvastatin 80 Mg Tabs (Simvastatin) .... As Directed  Allergies (verified): No Known Drug Allergies  Past History:  Past Medical History:    COPD    Diabetes mellitus, type II    Gout    Hypertension    Hyperlipidemia    SLEEP APNEA     Osteoarthritis    Benign prostatic hypertrophy  Past Surgical History:    Reviewed history from 11/27/2006 and no changes required:    toe surgery  Family History:    colon ca--no    prostate ca--no    Diabetes--  sister     Liver disease--M    CAD-- F  onset in his 71s   Social History:    Reviewed history from 06/30/2007 and no changes required:       Married       retired       2 children       Regular exercise-yes  Review of Systems General:  Denies fatigue, fever, and weight loss. CV:  Denies chest pain or discomfort, palpitations, and swelling of feet. Resp:  Denies cough and wheezing. GI:  Denies bloody stools and diarrhea. GU:  Denies dysuria, hematuria, and urinary hesitancy; nocturia x 3 .  Physical Exam  General:  alert, well-developed, and well-nourished.   Neck:  no masses and no thyromegaly.   Lungs:  normal respiratory effort, no intercostal retractions, no accessory muscle use, and normal breath sounds.   Heart:  normal rate, regular rhythm, no murmur, and no gallop.   Abdomen:  Bowel sounds positive,abdomen soft and non-tender without masses, organomegaly   Rectal:  No external abnormalities noted. Normal sphincter tone. No rectal masses or tenderness. Brown stools , trace hemocult + Prostate:  Prostate gland firm and smooth,no  nodularity, tenderness, mass, asymmetry or induration. prostate is definitely enlarged  Extremities:  no pretibial edema bilaterally  Psych:  Cognition and judgment appear intact. Alert and cooperative with normal attention span and concentration    Impression & Recommendations:  Problem # 1:  HYPERLIPIDEMIA (ICD-272.4)  just re-started simva, LFTs were elevated , re check them today His updated medication list for this problem includes:    Simvastatin 80 Mg Tabs (Simvastatin) .Marland Kitchen... As directed  Orders: Venipuncture (16109) TLB-ALT (SGPT) (84460-ALT) TLB-AST (SGOT) (84450-SGOT) TLB-Lipid Panel (80061-LIPID)  Labs Reviewed: SGOT: 23 (05/31/2008)   SGPT: 18 (05/31/2008)   HDL:38.3 (05/31/2008), 28.9 (10/06/2006)  LDL:DEL (05/31/2008), 43 (60/45/4098)  Chol:260 (05/31/2008), 106 (10/06/2006)  Trig:162 (05/31/2008), 173  (10/06/2006)  Problem # 2:  HEALTH SCREENING (ICD-V70.0) Td 01 pneumonia shot 2001, and today had a Cscope 5 or 6 years ago (Dr Loreta Ave), today has a trace + hemocult,re-refer to Dr Loreta Ave PSA, see #3 printed material provided regards shingles shot  encouraged more exercise   Orders: Gastroenterology Referral (GI)  Problem # 3:  BENIGN PROSTATIC HYPERTROPHY (ICD-600.00) patient has nocturia which is bothersome plan: add cardura, s/e d/w patient  last PSA  0.6 11-2006  Orders: TLB-PSA (Prostate Specific Antigen) (84153-PSA)  Problem # 4:  HYPERTENSION (ICD-401.9) adding cardura, see instructions  His updated medication list for this problem includes:    Amlodipine Besylate 10 Mg Tabs (Amlodipine besylate) .Marland Kitchen... 1 by mouth once daily    Carvedilol 12.5 Mg Tabs (Carvedilol) .Marland Kitchen... 1 by mouth two times a day    Doxazosin Mesylate 2 Mg Tabs (Doxazosin mesylate) .Marland Kitchen... 1 by mouth at bedtime  BP today: 160/84 Prior BP: 158/72 (05/24/2008)  Labs Reviewed: K+: 3.8 (03/09/2008) Creat: : 1.3 (03/09/2008)   Chol: 260 (05/31/2008)   HDL: 38.3 (05/31/2008)   LDL: DEL (05/31/2008)   TG: 162 (05/31/2008)  Complete Medication List: 1)  Baby Aspirin 81 Mg Chew (Aspirin) 2)  Actoplus Met 15-500 Mg Tabs (Pioglitazone hcl-metformin hcl) .... Take one tablet by mouth daily 3)  Colchicine 0.6 Mg Tabs (Colchicine) .... Take one tablet four times daily. 4)  Humalog Mix 75/25 Kwikpen 75-25 % Susp (Insulin lispro prot & lispro) .... 30 u in am 15 u in pm 5)  Mvi  6)  Fish Oil  .... Restart 04-26-08 7)  Amlodipine Besylate 10 Mg Tabs (Amlodipine besylate) .Marland Kitchen.. 1 by mouth once daily 8)  Carvedilol 12.5 Mg Tabs (Carvedilol) .Marland Kitchen.. 1 by mouth two times a day 9)  Vicodin 5-500 Mg Tabs (Hydrocodone-acetaminophen) .Marland Kitchen.. 1 or 2 by mouth q 6 hours 10)  Simvastatin 80 Mg Tabs (Simvastatin) .... As directed 11)  Doxazosin Mesylate 2 Mg Tabs (Doxazosin mesylate) .Marland Kitchen.. 1 by mouth at bedtime  Other Orders: Pneumoccal  Vaccine Adm- Medicare (G0009) Admin 1st Vaccine (11914)  Patient Instructions: 1)  Please schedule a follow-up appointment in 6 months .  2)  Check your blood pressure 2 or 3 times a week. If it is more than 140/85 consistently,please let us know  Prescriptions: DOXAZOSIN MESYLATE 2 MG TABS (DOXAZOSIN MESYLATE) 1 by mouth at bedtime  #30 x 6   Entered and Authorized by:   Nolon Rod. Rakeb Kibble MD   Signed by:   Nolon Rod. Cathalina Barcia MD on 07/11/2008   Method used:   Print then Give to Patient   RxID:   [id number]         Pneumovax Vaccine    Vaccine Type: Pneumovax (Medicare)    Site: right deltoid  Mfr: Merck    Dose: 0.5 ml    Route: IM    Given by: Shary Decamp    Exp. Date: 05/12/2009    Lot #: [id number]

## 2010-05-09 NOTE — Progress Notes (Signed)
Summary: ?fish oil  Phone Note Outgoing Call Call back at [facility] Phone [phone]   Summary of Call: LAB RESULTS: microalb slightly (+) , re check in few moths advise patient:  labs ok, the "arthritis test" were neg (likely has OA not RA) Signed by Nolon Rod. Aviella Disbrow MD on 04/21/2008 at 11:56 AM   Follow-up for Phone Call        discussed labs with patient -- patient would like to know if you would like for him to restart fish oil since LFTs are back down to normal...Marland KitchenMarland KitchenMarland Kitchen Shary Decamp  April 25, 2008 8:23 AM  ok to restart, will recheck LFTs on RTC [facility] E. Karlynn Furrow MD  April 26, 2008 11:27 AM  discussed with patient .Marland KitchenShary Decamp  April 26, 2008 4:36 PM     New/Updated Medications: * FISH OIL restart 04-26-08

## 2010-05-09 NOTE — Progress Notes (Signed)
Summary: CLARIFY RX (CHLOSTEROL MEDICATION)  Phone Note Outgoing Call   Call placed by: Shonna Chock,  September 13, 2007 12:18 PM Call placed to: Patient Details for Reason: CLARIFY RX Summary of Call: [facility], Reason for call was to ask patient about chlosterol medication, we received a refill request for lipitor, med list indicates that patient is taking zocor (need to clarify).Shonna Chock  September 13, 2007 12:45 PM   Follow-up for Phone Call        DONE. Ardyth Man  September 13, 2007 1:04 PM  Follow-up by: Ardyth Man,  September 13, 2007 1:04 PM

## 2010-05-09 NOTE — Miscellaneous (Signed)
Summary: labs from Dr. Roanna Raider  Clinical Lists Changes  Observations: Added new observation of SGPT (ALT): 22 units/L (12/23/2007 8:55) Added new observation of CALCIUM: 9.7 mg/dL (13/24/4010 2:72) Added new observation of BG RANDOM: 135 mg/dL (53/66/4403 4:74) Added new observation of CREATININE: 1.4 mg/dL (25/95/6387 5:64) Added new observation of BUN: 21 mg/dL (33/29/5188 4:16) Added new observation of CO2 TOTAL: 27 mmol/L (12/23/2007 8:55) Added new observation of CHLORIDE: 104 mmol/L (12/23/2007 8:55) Added new observation of POTASSIUM: 5.2 mmol/L (12/23/2007 8:55) Added new observation of SODIUM: 138 mmol/L (12/23/2007 8:55)      Lab Entry Test Date: 12/23/2007                        Value        Units        H/L   Reference  HgbA1c:               7.3          %             H    (4-7)  Comments: LABS FAXED FROM DR [facility]  Test entered by:      S. HAWKS, CMA Test ordered by:      DR [facility]    Chemistry Labs Test Date: 12/23/2007                      Value Units        H/L   Reference  Sodium:             138   mmol/L             (137-145) Potassium:          5.2   mmol/L        H    (3.6-5.0) Chloride:           104   mmol/L             (101-111) CO2:                27    mmol/L             (22-31) BUN:                21    mg/dL              (6-06) Creatinine:         1.4   mg/dL              (3.0-1.6) Glucose-random:     135   mg/dL         H    (01-093) Calcium (total):    9.7   mg/dL              (2-35.5) SGPT:               22    U/L                (10-40)  Comments: LABS FAXED FROM DR [facility]  Test ordered by:    DR [facility]

## 2010-05-09 NOTE — Miscellaneous (Signed)
Summary: Orders Update pft charges  Clinical Lists Changes  Orders: Added new Service order of Carbon Monoxide diffusing w/capacity (94720) - Signed Added new Service order of Lung Volumes (94240) - Signed Added new Service order of Spirometry (Pre & Post) (94060) - Signed 

## 2010-05-09 NOTE — Assessment & Plan Note (Signed)
Summary: referral/swh   Vital Signs:  Patient Profile:   76 Years Old Male Height:     68 inches Weight:      192.4 pounds Pulse rate:   82 / minute BP sitting:   132 / 70  Vitals Entered By: Shary Decamp (February 17, 2008 2:39 PM)                 Chief Complaint:  c/o of pain in left leg/foot.  History of Present Illness: c/o of pain  @ the base of the left  great toe x 3 weeks area gets warm-red at times no swelling h/o gout  denies injury    Current Allergies (reviewed today): No known allergies   Past Medical History:    Reviewed history from 06/30/2007 and no changes required:       COPD       Diabetes mellitus, type II       Gout       Hypertension       Hyperlipidemia       SLEEP APNEA          Past Surgical History:    Reviewed history from 11/27/2006 and no changes required:       toe surgery     Review of Systems  General      Denies fever.  CV      we are adjusting his BP meds, see labs   Physical Exam  General:     alert and well-developed.   Pulses:     no pretibial edema bilaterally  Extremities:     no pretibial edema bilaterally  R foot--normal L foot--base of great toe slightly  warm, slightly  swollen,tender to palpation    Impression & Recommendations:  Problem # 1:  GOUT (ICD-274.9) symptoms most likely due to gout patient has DM and last creat was 1.6, we recently  d/c  HCTZ which will help in the future plan: colchicine, avoid NSAIDs, ice , XR  His updated medication list for this problem includes:    Colchicine 0.6 Mg Tabs (Colchicine) .Marland Kitchen... Take one tablet four times daily.  Orders: T-Foot Left Min 3 Views ([id number])   Complete Medication List: 1)  Baby Aspirin 81 Mg Chew (Aspirin) 2)  Actoplus Met 15-500 Mg Tabs (Pioglitazone hcl-metformin hcl) .... Take one tablet by mouth daily 3)  Colchicine 0.6 Mg Tabs (Colchicine) .... Take one tablet four times daily. 4)  Humalog Mix 75/25 Kwikpen 75-25 % Susp  (Insulin lispro prot & lispro) .... 30 u in am 15 u in pm 5)  Mvi  6)  Fish Oil  7)  Simvastatin 80 Mg Tabs (Simvastatin) .Marland Kitchen.. 1 by mouth qhs 8)  Amlodipine Besylate 10 Mg Tabs (Amlodipine besylate) .Marland Kitchen.. 1 by mouth once daily 9)  Carvedilol 12.5 Mg Tabs (Carvedilol) .Marland Kitchen.. 1 by mouth two times a day 10)  Vicodin 5-500 Mg Tabs (Hydrocodone-acetaminophen) .Marland Kitchen.. 1 or 2 by mouth q 6 hours   Patient Instructions: 1)  colchicine every 3 hours until better 2)  ICE 3)  vicodin if pain severe 4)  XR 5)  don't use motrin-advil or OTCs   Prescriptions: COLCHICINE 0.6 MG  TABS (COLCHICINE) Take one tablet four times daily.  #120 x 0   Entered and Authorized by:   Nolon Rod. Jahon Bart MD   Signed by:   Nolon Rod. Brihany Butch MD on 02/17/2008   Method used:   Electronically to        [facility] (retail)       [address]       [place], [place]  [address]       Ph: [phone]       Fax: [phone]   RxID:   [id number] VICODIN 5-500 MG TABS (HYDROCODONE-ACETAMINOPHEN) 1 or 2 by mouth q 6 hours  #30 x 0   Entered and Authorized by:   Nolon Rod. Xzavian Semmel MD   Signed by:   Nolon Rod. Wasim Hurlbut MD on 02/17/2008   Method used:   Print then Give to Patient   RxID:   [id number] CARVEDILOL 12.5 MG TABS (CARVEDILOL) 1 by mouth two times a day  #60 x 1   Entered by:   Shary Decamp   Authorized by:   Nolon Rod. Kauan Kloosterman MD   Signed by:   Shary Decamp on 02/17/2008   Method used:   Electronically to        [facility] (retail)       [address]       [place], [place]  [address]       Ph: [phone]       Fax: [phone]   RxID:   [id number]  ]

## 2010-05-09 NOTE — Letter (Signed)
Summary: Results Follow up Letter  [facility]  [address] [place], [place] [address]   Phone: [phone]  Fax: [phone]    07/13/2008 MRN: [id number]  [facility] [address] [place], [place]  [address]  Dear Bradley Johns,  The following are the results of your recent test(s):  Test         Result    Pap Smear:        Normal _____  Not Normal _____ Comments: ______________________________________________________ Cholesterol: LDL(Bad cholesterol):         Your goal is less than:         HDL (Good cholesterol):       Your goal is more than: Comments:  ______________________________________________________ Mammogram:        Normal _____  Not Normal _____ Comments:  ___________________________________________________________________ Hemoccult:        Normal _____  Not normal _______ Comments:    _____________________________________________________________________ Other Tests:  Attached is a copy of your lab results.  Your liver & prostate test were normal.  Your cholesterol has improved.  The HDL (good cholesterol) is a little low.  Exercising will help improve the good cholesterol.  Continue with all your current medications.  Please call our office if you have any questions. Alena Bills

## 2010-05-09 NOTE — Assessment & Plan Note (Signed)
Summary: 4 MONTH FOLLOW-UP/SCM   Vital Signs:  Patient Profile:   76 Years Old Male Height:     68 inches Weight:      195.8 pounds Pulse rate:   70 / minute BP sitting:   138 / 80  Vitals Entered By: Shary Decamp (January 18, 2008 9:22 AM)                 Chief Complaint:  rov - fasting; pain in rt lower leg/ankle off & on - pain is relieved by alleve.  History of Present Illness: rov - fasting;  pain @ R>L lower leg/ankle off & on - pain is relieved by alleve, no swelling, redness warmness, no calf pain, pain not neccesarily exertional       COPD-- asx , off inhalers       Diabetes-- per Dr Roanna Raider, last A1c better than before       Gout--no recent attacks       Hypertension-- last Cr 1.4 (slightly  elevated), ambulatory BPs 120s to 140s       Hyperlipidemia--cost of meds is major issue     Updated Prior Medication List: BABY ASPIRIN 81 MG  CHEW (ASPIRIN)  BENAZEPRIL-HYDROCHLOROTHIAZIDE 20-25 MG  TABS (BENAZEPRIL-HYDROCHLOROTHIAZIDE) 1 by mouth qd ACTOPLUS MET 15-500 MG  TABS (PIOGLITAZONE HCL-METFORMIN HCL) TAKE ONE TABLET BY MOUTH DAILY COLCHICINE 0.6 MG  TABS (COLCHICINE) Take one tablet four times daily. HUMALOG MIX 75/25 KWIKPEN 75-25 %  SUSP (INSULIN LISPRO PROT & LISPRO) 30 U IN AM 15 U IN PM * MVI  * FISH OIL   Current Allergies (reviewed today): No known allergies   Past Medical History:    Reviewed history from 06/30/2007 and no changes required:       COPD       Diabetes mellitus, type II       Gout       Hypertension       Hyperlipidemia       SLEEP APNEA          Past Surgical History:    Reviewed history from 11/27/2006 and no changes required:       toe surgery   Social History:    Reviewed history from 06/30/2007 and no changes required:       Married       Former Smoker       Alcohol use-no       Drug use-no       Regular exercise-yes   Risk Factors: Tobacco use:  quit Drug use:  no Alcohol use:  no Exercise:   yes  Colonoscopy History:    Date of Last Colonoscopy:  09/13/2003   Review of Systems  General       not dieting  CV      Denies chest pain or discomfort.      some DOE going up the driveway, over all this symptoms is better than before  GI      Denies bloody stools, nausea, and vomiting.   Physical Exam  General:     alert and well-developed.   Lungs:     normal respiratory effort, no intercostal retractions, no accessory muscle use, and normal breath sounds.   Heart:     normal rate, regular rhythm, and no murmur.   Pulses:     normal pedal pulses bilaterally  Extremities:     no pretibial edema bilaterally   Diabetes Management Exam:    Foot Exam (with socks  and/or shoes not present):       Sensory-Pinprick/Light touch:          Left medial foot (L-4): normal          Left dorsal foot (L-5): normal          Left lateral foot (S-1): normal          Right medial foot (L-4): normal          Right dorsal foot (L-5): normal          Right lateral foot (S-1): normal       Sensory-Monofilament:          Left foot: normal          Right foot: diminished       Inspection:          Left foot: abnormal             Comments: dry skin, (+) callouses          Right foot: abnormal             Comments: dry skin, (+) callouses       Nails:          Left foot: thickened          Right foot: thickened    Impression & Recommendations:  Problem # 1:  HYPERLIPIDEMIA (ICD-272.4) results on Vytoryn 10/80  (08-19-07) : ALT nl, TC 99, TG 112, HDL 30, LDL 46 zetia was d/c due to cost see phone note from 7-09, all chol meds were stopped and pt did  not restarted plan: restart meds and figure out if they are causing s/e see instructions  His updated medication list for this problem includes:    Simvastatin 80 Mg Tabs (Simvastatin) .Marland Kitchen... 1 by mouth qhs    Fenofibrate 54 Mg Tabs (Fenofibrate) .Marland Kitchen... 1 by mouth once daily   Problem # 2:  HYPERTENSION (ICD-401.9) extensive  discussion regards kidney fx, last creat. slightly  elevated to 1.4  His updated medication list for this problem includes:    Benazepril-hydrochlorothiazide 20-25 Mg Tabs (Benazepril-hydrochlorothiazide) .Marland Kitchen... 1 by mouth qd    Norvasc 10 Mg Tabs (Amlodipine besylate) .Marland Kitchen... Take one tablet daily    Amlodipine Besylate 10 Mg Tabs (Amlodipine besylate) .Marland Kitchen... 1 by mouth once daily  BP today: 138/80 Prior BP: 142/80 (09/01/2007)  Labs Reviewed: Creat: 1.4 (12/23/2007) Chol: 106 (10/06/2006)   HDL: 28.9 (10/06/2006)   LDL: 43 (10/06/2006)   TG: 173 (10/06/2006)   Problem # 3:  DIABETES MELLITUS, TYPE II (ICD-250.00) eye check per patient (-) July 09 med managment per Dr Roanna Raider feet care d/w patient printed material provided regards feet care patient has knowledge about diabetic diet but not eating well: counseled His updated medication list for this problem includes:    Baby Aspirin 81 Mg Chew (Aspirin)    Benazepril-hydrochlorothiazide 20-25 Mg Tabs (Benazepril-hydrochlorothiazide) .Marland Kitchen... 1 by mouth qd    Actoplus Met 15-500 Mg Tabs (Pioglitazone hcl-metformin hcl) .Marland Kitchen... Take one tablet by mouth daily    Humalog Mix 75/25 Kwikpen 75-25 % Susp (Insulin lispro prot & lispro) .Marland KitchenMarland KitchenMarland KitchenMarland Kitchen 30 u in am 15 u in pm   Problem # 4:  leg pain observe  Problem # 5:  multiple questions answer regards what OTCs are ok to take in diabetes F2F > 25 min  Complete Medication List: 1)  Baby Aspirin 81 Mg Chew (Aspirin) 2)  Benazepril-hydrochlorothiazide 20-25 Mg Tabs (Benazepril-hydrochlorothiazide) .Marland Kitchen.. 1 by mouth qd 3)  Actoplus Met 15-500 Mg Tabs (Pioglitazone hcl-metformin hcl) .... Take one tablet by mouth daily 4)  Colchicine 0.6 Mg Tabs (Colchicine) .... Take one tablet four times daily. 5)  Humalog Mix 75/25 Kwikpen 75-25 % Susp (Insulin lispro prot & lispro) .... 30 u in am 15 u in pm 6)  Norvasc 10 Mg Tabs (Amlodipine besylate) .... Take one tablet daily 7)  Mvi  8)  Fish Oil  9)  Simvastatin 80  Mg Tabs (Simvastatin) .Marland Kitchen.. 1 by mouth qhs 10)  Fenofibrate 54 Mg Tabs (Fenofibrate) .Marland Kitchen.. 1 by mouth once daily 11)  Amlodipine Besylate 10 Mg Tabs (Amlodipine besylate) .Marland Kitchen.. 1 by mouth once daily   Patient Instructions: 1)  Restart Simvastatin 80mg  1/2 a day 2)  in 2 weeks  restart the fenofibrate 3)  OV w/ nurse in 4 weeks: BMP dx HTN , f/u on cholesterol med tolerance and BP check 4)  Please schedule a follow-up appointment in 4 months.   Prescriptions: BENAZEPRIL-HYDROCHLOROTHIAZIDE 20-25 MG  TABS (BENAZEPRIL-HYDROCHLOROTHIAZIDE) 1 by mouth qd  #30 Tablet x 6   Entered by:   Shary Decamp   Authorized by:   Nolon Rod. Emilyann Banka MD   Signed by:   Shary Decamp on 01/18/2008   Method used:   Print then Give to Patient   RxID:   [id number] AMLODIPINE BESYLATE 10 MG TABS (AMLODIPINE BESYLATE) 1 by mouth once daily  #30 x 6   Entered by:   Shary Decamp   Authorized by:   Nolon Rod. Evanna Washinton MD   Signed by:   Shary Decamp on 01/18/2008   Method used:   Print then Give to Patient   RxID:   [id number] FENOFIBRATE 54 MG  TABS (FENOFIBRATE) 1 by mouth once daily  #30 x 6   Entered and Authorized by:   Nolon Rod. Sondi Desch MD   Signed by:   Nolon Rod. Saquan Furtick MD on 01/18/2008   Method used:   Print then Give to Patient   RxID:   [id number] SIMVASTATIN 80 MG  TABS (SIMVASTATIN) 1 by mouth qhs  #30 x 6   Entered and Authorized by:   Nolon Rod. Nioka Thorington MD   Signed by:   Nolon Rod. Ilyana Manuele MD on 01/18/2008   Method used:   Print then Give to Patient   RxID:   [id number]  ]

## 2010-05-09 NOTE — Miscellaneous (Signed)
Summary: colonoscopy results  Clinical Lists Changes  Observations: Added new observation of COLONOSCOPY: Results: Polyp.  Results: Diverticulosis.       Location:  [facility]    (07/25/2009 13:06)      Colonoscopy  Procedure date:  07/25/2009  Findings:      Results: Polyp.  Results: Diverticulosis.       Location:  [facility].      Colonoscopy  Procedure date:  07/25/2009  Findings:      Results: Polyp.  Results: Diverticulosis.       Location:  [facility].

## 2010-05-09 NOTE — Miscellaneous (Signed)
Summary: labs from dr deveshwar  original scanned into [facility]  July 18, 2008 9:54 AM  Clinical Lists Changes  Observations: Added new observation of BILI TOTAL: 0.5 mg/dL (69/62/9528 4:13) Added new observation of ALK PHOS: 83 units/L (07/12/2008 9:47) Added new observation of SGPT (ALT): 32 units/L (07/12/2008 9:47) Added new observation of SGOT (AST): 26 units/L (07/12/2008 9:47) Added new observation of PROTEIN, TOT: 7.5 g/dL (24/40/1027 2:53) Added new observation of URIC ACID: 8.7 mg/dL (66/44/0347 4:25) Added new observation of ALBUMIN: 4.6 g/dL (95/63/8756 4:33) Added new observation of CALCIUM: 9.7 mg/dL (29/51/8841 6:60) Added new observation of GLUCOSE SER: 176 mg/dL (63/04/6008 9:32) Added new observation of CREATININE: 1.22 mg/dL (35/57/3220 2:54) Added new observation of BUN: 12 mg/dL (27/09/2374 2:83) Added new observation of CO2 TOTAL: 26 mmol/L (07/12/2008 9:47) Added new observation of CHLORIDE: 103 mmol/L (07/12/2008 9:47) Added new observation of POTASSIUM: 4.4 mmol/L (07/12/2008 9:47) Added new observation of SODIUM: 142 mmol/L (07/12/2008 9:47) Added new observation of MCV: 87.9 fL (07/12/2008 9:47) Added new observation of HCT: 42.0 % (07/12/2008 9:47) Added new observation of HGB: 14.6 g/dL (15/17/6160 7:37) Added new observation of RBC: 4.78 10*6/mm3 (07/12/2008 9:47) Added new observation of WBC: 6.2 10*3/mm3 (07/12/2008 9:47)

## 2010-05-09 NOTE — Progress Notes (Signed)
Summary: sched lab ov in 1 month  Phone Note Outgoing Call Call back at [facility] Phone [phone] Call back at Work Phone [phone]   Summary of Call: LAB RESULTS:  - chol elevated rec simvastatin 80 1/2 qd  - labs in 1 mo -- FLP, LFT - 272.2 COPY OF LABS MAILED TO PT .Marland KitchenMarland KitchenShary Decamp  June 06, 2008 10:48 AM DISCUSSED WITH PT .Marland KitchenMarland KitchenMarland KitchenShary Decamp  June 07, 2008 11:37 AM    Follow-up for Phone Call        Jamesetta So -- can you please contact pt & schedule lab ov in 1 month? LFTs, FLP 272.2 Thanks .Marland KitchenMarland KitchenMarland KitchenShary Decamp  June 07, 2008 11:38 AM   Additional Follow-up for Phone Call Additional follow up Details #1::        Patient is coming in on 4-6-10for cpx and will have lab done them. Additional Follow-up by: Freddy Jaksch,  June 07, 2008 2:31 PM    New/Updated Medications: SIMVASTATIN 80 MG TABS (SIMVASTATIN) as directed   Prescriptions: SIMVASTATIN 80 MG TABS (SIMVASTATIN) as directed  #30 x 3   Entered by:   Shary Decamp   Authorized by:   Nolon Rod. Paz MD   Signed by:   Shary Decamp on 06/07/2008   Method used:   Electronically to        [facility] [phone]* (retail)       [address] [place], [place]  [address]       Ph: [phone] or [phone]       Fax: [phone]   RxID:   [id number]

## 2010-05-09 NOTE — Letter (Signed)
Summary: Results Follow up Letter   at [facility]  [address] [place], [place] [address]   Phone: [phone]  Fax: [phone]    01/14/2007 MRN: [id number]  Bradley Johns [address] [place], [place]  [address]  Dear Mr. CHALOUX,  The following are the results of your recent test(s):  Test         Result    Pap Smear:        Normal _____  Not Normal _____ Comments: ______________________________________________________ Cholesterol: LDL(Bad cholesterol):         Your goal is less than:         HDL (Good cholesterol):       Your goal is more than: Comments:  ______________________________________________________ Mammogram:        Normal _____  Not Normal _____ Comments:  ___________________________________________________________________ Hemoccult:        Normal _____  Not normal _______ Comments:    _____________________________________________________________________ Other Tests:  LABS NORMAL :)  We routinely do not discuss normal results over the telephone.  If you desire a copy of the results, or you have any questions about this information we can discuss them at your next office visit.   Sincerely,

## 2010-05-09 NOTE — Assessment & Plan Note (Signed)
Summary: BP CHECK AND BMP DX HTN/KDC  Nurse Visit   Vital Signs:  Patient profile:   76 year old male Weight:      195.8 pounds Pulse rate:   75 / minute BP sitting:   164 / 82  (left arm) Cuff size:   large  Vitals Entered By: Shary Decamp (January 24, 2009 10:41 AM) CC: bp check Comments  - pt instructed to increase Benazapril to 20mg  (new rx called)  - info given to pt on low sodium diet  - pt will have bp check next week & bmp  - no charge today's visit Shary Decamp  January 24, 2009 10:58 AM    Serial Vital Signs/Assessments:  Time      Position  BP       Pulse  Resp  Temp     By 10:51 AM            152/80                         Shary Decamp   Impression & Recommendations:  Problem # 1:  HYPERTENSION (ICD-401.9) not yet at goal  increase benazepril  pt will have bp check next week & bmp His updated medication list for this problem includes:    Amlodipine Besylate 10 Mg Tabs (Amlodipine besylate) .Marland Kitchen... 1 by mouth once daily    Carvedilol 12.5 Mg Tabs (Carvedilol) .Marland Kitchen... 1 by mouth two times a day    Doxazosin Mesylate 2 Mg Tabs (Doxazosin mesylate) .Marland Kitchen... 1 by mouth at bedtime    Benazepril Hcl 20 Mg Tabs (Benazepril hcl) .Marland Kitchen... 1 by mouth qd  Orders: Est. Patient Level I (82956) Venipuncture (21308) No Charge Patient Arrived (NCPA0) (NCPA0)  Complete Medication List: 1)  Baby Aspirin 81 Mg Chew (Aspirin) 2)  Actoplus Met 15-500 Mg Tabs (Pioglitazone hcl-metformin hcl) .... Take one tablet by mouth daily 3)  Colchicine 0.6 Mg Tabs (Colchicine) .... Take one tablet four times daily. 4)  Humalog Mix 75/25 Kwikpen 75-25 % Susp (Insulin lispro prot & lispro) .... 30 u in am 15 u in pm 5)  Mvi  6)  Fish Oil  .... Restart 04-26-08 7)  Amlodipine Besylate 10 Mg Tabs (Amlodipine besylate) .Marland Kitchen.. 1 by mouth once daily 8)  Carvedilol 12.5 Mg Tabs (Carvedilol) .Marland Kitchen.. 1 by mouth two times a day 9)  Vicodin 5-500 Mg Tabs (Hydrocodone-acetaminophen) .Marland Kitchen.. 1 or 2 by mouth q 6  hours 10)  Simvastatin 80 Mg Tabs (Simvastatin) .... As directed 11)  Doxazosin Mesylate 2 Mg Tabs (Doxazosin mesylate) .Marland Kitchen.. 1 by mouth at bedtime 12)  Benazepril Hcl 20 Mg Tabs (Benazepril hcl) .Marland Kitchen.. 1 by mouth qd   Current Medications (verified): 1)  Baby Aspirin 81 Mg  Chew (Aspirin) 2)  Actoplus Met 15-500 Mg  Tabs (Pioglitazone Hcl-Metformin Hcl) .... Take One Tablet By Mouth Daily 3)  Colchicine 0.6 Mg  Tabs (Colchicine) .... Take One Tablet Four Times Daily. 4)  Humalog Mix 75/25 Kwikpen 75-25 %  Susp (Insulin Lispro Prot & Lispro) .... 30 U in Am 15 U in Pm 5)  Mvi 6)  Fish Oil .... Restart 04-26-08 7)  Amlodipine Besylate 10 Mg Tabs (Amlodipine Besylate) .Marland Kitchen.. 1 By Mouth Once Daily 8)  Carvedilol 12.5 Mg Tabs (Carvedilol) .Marland Kitchen.. 1 By Mouth Two Times A Day 9)  Vicodin 5-500 Mg Tabs (Hydrocodone-Acetaminophen) .Marland Kitchen.. 1 or 2 By Mouth Q 6 Hours 10)  Simvastatin 80 Mg Tabs (Simvastatin) .Marland KitchenMarland KitchenMarland Kitchen  As Directed 11)  Doxazosin Mesylate 2 Mg Tabs (Doxazosin Mesylate) .Marland Kitchen.. 1 By Mouth At Bedtime 12)  Benazepril Hcl 20 Mg Tabs (Benazepril Hcl) .Marland Kitchen.. 1 By Mouth Qd  Allergies (verified): No Known Drug Allergies  Orders Added: 1)  Est. Patient Level I [16109] 2)  Venipuncture [60454] 3)  No Charge Patient Arrived (NCPA0) [NCPA0] Prescriptions: BENAZEPRIL HCL 20 MG TABS (BENAZEPRIL HCL) 1 by mouth qd  #30 x 1   Entered by:   Shary Decamp   Authorized by:   Nolon Rod. Matej Sappenfield MD   Signed by:   Shary Decamp on 01/24/2009   Method used:   Electronically to        [facility] [phone]* (retail)       [address] [place], [place]  [address]       Ph: [phone] or [phone]       Fax: [phone]   RxID:   [id number]

## 2010-05-09 NOTE — Consult Note (Signed)
Summary: [facility]  [facility]   Imported By: Lanelle Bal 07/21/2008 10:23:02  _____________________________________________________________________  External Attachment:    Type:   Image     Comment:   External Document

## 2010-05-09 NOTE — Progress Notes (Signed)
Summary: ?fenofibrate  Phone Note Call from Patient   Caller: Patient Summary of Call: Dr.Sheilyn Boehlke   Call back  [phone]  patient said Dr.Derran Sear gave him a Rx for Fenofibrate 54mg f and he said it had to stop taking it because it was giving him gas and his stools was loose. what a new Rx for something else. Initial call taken by: Vanessa Swaz[place],  February 07, 2008 11:42 AM  Follow-up for Phone Call        Rec: d/c fenofibrate continue w/ simvastatin 80 mg 1 a day (if he is still on 1/2 a day, go to 1 a day) f/u as planned [place] E. Albertina Leise MD  February 08, 2008 2:03 PM   Left message on machine to return call........Marland KitchenShary Decamp  February 08, 2008 4:30 PM  discussed with patient .Marland KitchenShary Decamp  February 09, 2008 4:16 PM

## 2010-05-09 NOTE — Procedures (Signed)
Summary: Colonoscopy/[facility]  Colonoscopy/[facility]   Imported By: Lanelle Bal 07/31/2009 12:11:54  _____________________________________________________________________  External Attachment:    Type:   Image     Comment:   External Document

## 2010-05-09 NOTE — Progress Notes (Signed)
Summary: med change  Phone Note From Pharmacy   Summary of Call: dr. Blossom Hoops tom at [facility] [place] [address]  [phone] pt's insurance will not cover CADUET 10 MG-80 MG TABLET they did not say anything about a prior auth. The pt would need two different rx norvasc and liptor.  Initial call taken by: Charolette Child,  May 12, 2007 10:24 AM  Follow-up for Phone Call        Is this okay with you to switch to norvasc and lipitor instead of the caduet.  ...................................................................Ardyth Man  May 12, 2007 10:32 AM  Follow-up by: Ardyth Man,  May 12, 2007 10:32 AM  Additional Follow-up for Phone Call Additional follow up Details #1::        yes okay. Additional Follow-up by: Leanne Chang MD,  May 12, 2007 3:08 PM    New/Updated Medications: NORVASC 10 MG  TABS (AMLODIPINE BESYLATE) Take one tablet daily LIPITOR 80 MG  TABS (ATORVASTATIN CALCIUM) Take one tablet daily   Prescriptions: LIPITOR 80 MG  TABS (ATORVASTATIN CALCIUM) Take one tablet daily  #30 x 3   Entered by:   Ardyth Man   Authorized by:   Leanne Chang MD   Signed by:   Ardyth Man on 05/12/2007   Method used:   Electronically sent to ...       [facility]  [address]       [address]       [place], [place]  [address]       Ph: [phone]       Fax: [phone]   RxID:   [id number] NORVASC 10 MG  TABS (AMLODIPINE BESYLATE) Take one tablet daily  #30 x 3   Entered by:   Ardyth Man   Authorized by:   Leanne Chang MD   Signed by:   Ardyth Man on 05/12/2007   Method used:   Electronically sent to ...       [facility]  [address]       [address]       [place], [place]  [address]       Ph: [phone]       Fax: [phone]   RxID:   [id number]

## 2010-05-09 NOTE — Miscellaneous (Signed)
  Clinical Lists Changes 

## 2010-05-09 NOTE — Letter (Signed)
Summary: [facility]  [facility]   Imported By: Lanelle Bal 02/07/2009 13:01:55  _____________________________________________________________________  External Attachment:    Type:   Image     Comment:   External Document

## 2010-05-09 NOTE — Progress Notes (Signed)
Summary: refill change # tabs  Phone Note Refill Request Message from:  Fax from Pharmacy on November 09, 2009 1:38 PM  Refills Requested: Medication #1:  CARVEDILOL 12.5 MG TABS 1 by mouth two times a day [facility] fax [phone] requesting 60 tab - patient takes 2 a day  Initial call taken by: Okey Regal [place],  November 09, 2009 1:39 PM    Prescriptions: CARVEDILOL 12.5 MG TABS (CARVEDILOL) 1 by mouth two times a day  #60 x 11   Entered by:   Army Fossa CMA   Authorized by:   Nolon Rod. Paz MD   Signed by:   Army Fossa CMA on 11/09/2009   Method used:   Electronically to        [facility] (retail)       [address]       [place], [place]  [address]       Ph: [phone]       Fax: [phone]   RxID:   [id number]

## 2010-05-09 NOTE — Progress Notes (Signed)
Summary: out of bp/water pill    Phone Note Call from Patient   Caller: Patient Reason for Call: Refill Medication Summary of Call: dr. Blossom Hoops [phone] [facility] [address] [place] [phone] pt is out of his caduet 10mg /80mg  tab. he has no refills and has contacted the pharmacy they couldnt refill the rx.  Initial call taken by: Charolette Child,  September 30, 2006 2:01 PM  Follow-up for Phone Call        rx called in pharm vm #[phone] only ov due pt wife informed Follow-up by: Kandice Hams,  September 30, 2006 2:56 PM

## 2010-05-09 NOTE — Letter (Signed)
Summary: [place] No Show Letter  [place] [facility] [place]  [address] [place], [place] [address]   Phone: [phone]  Fax: [phone]    08/30/2009 MRN: [id number]  [facility] [address] [place], [place]  [address]   Dear Mr. MARTINE,   Our records indicate that you missed your scheduled appointment with ______Dr.Paz__________ on ____5/26/11______.  Please contact this office to reschedule your appointment as soon as possible.  It is important that you keep your scheduled appointments with your physician, so we can provide you the best care possible.  Please be advised that there may be a charge for "no show" appointments.    Sincerely,   [place] [facility]

## 2010-05-09 NOTE — Progress Notes (Signed)
  Phone Note Outgoing Call Call back at [facility] Phone [phone]   Call placed by: Ardyth Man,  November 30, 2006 9:02 AM Call placed to: Patient Summary of Call: Pt aware ...................................................................Ardyth Man  November 30, 2006 9:02 AM

## 2010-05-09 NOTE — Progress Notes (Signed)
Summary: xray results  Phone Note Outgoing Call Call back at [facility] Phone [phone] Call back at Work Phone [phone]   Details for Reason: XRAY RESULTS: tell patient XR ok except fro some OA plan the same, keep f/u in December Signed by [facility] E. Paz MD on 02/19/2008 at 4:02 PM  Summary of Call: discussed with patient ..........Marland KitchenShary Decamp  February 22, 2008 11:16 AM

## 2010-05-10 NOTE — Letter (Signed)
Summary: [facility]   Imported By: Job Founds 10/26/2006 15:12:03  _____________________________________________________________________  External Attachment:    Type:   Image     Comment:   External Document

## 2010-05-15 NOTE — Progress Notes (Signed)
Summary: four 90 day prescriptions refills  Phone Note Refill Request Message from:  Patient on May 07, 2010 2:09 PM  Refills Requested: Medication #1:  CARVEDILOL 12.5 MG TABS 1 by mouth two times a day  Medication #2:  DOXAZOSIN MESYLATE 4 MG TABS 1 by mouth at bedtime  Medication #3:  LOSARTAN POTASSIUM 50 MG TABS one tablet daily  Medication #4:  AMLODIPINE BESYLATE 10 MG TABS 1 by mouth once daily   Notes: **Simvastatin also wants 90 day prescriptions---will pick up prescriptions so he can comparison shop locally--see other phone note--has 5 meds in all  Initial call taken by: Jerolyn Shin,  May 07, 2010 2:13 PM  Follow-up for Phone Call        Patient aware. Follow-up by: Lucious Groves CMA,  May 07, 2010 4:07 PM    Prescriptions: AMLODIPINE BESYLATE 10 MG TABS (AMLODIPINE BESYLATE) 1 by mouth once daily  #90 x 2   Entered by:   Lucious Groves CMA   Authorized by:   Nolon Rod. Taneasha Fuqua MD   Signed by:   Lucious Groves CMA on 05/07/2010   Method used:   Print then Give to Patient   RxID:   [id number] LOSARTAN POTASSIUM 50 MG TABS (LOSARTAN POTASSIUM) one tablet daily  #90 x 2   Entered by:   Lucious Groves CMA   Authorized by:   Nolon Rod. Koren Sermersheim MD   Signed by:   Lucious Groves CMA on 05/07/2010   Method used:   Print then Give to Patient   RxID:   [id number] SIMVASTATIN 80 MG TABS (SIMVASTATIN) as directed  #90 x 2   Entered by:   Lucious Groves CMA   Authorized by:   Nolon Rod. Kiriana Worthington MD   Signed by:   Lucious Groves CMA on 05/07/2010   Method used:   Print then Give to Patient   RxID:   [id number] DOXAZOSIN MESYLATE 4 MG TABS (DOXAZOSIN MESYLATE) 1 by mouth at bedtime  #90 x 2   Entered by:   Lucious Groves CMA   Authorized by:   Nolon Rod. Julianah Marciel MD   Signed by:   Lucious Groves CMA on 05/07/2010   Method used:   Print then Give to Patient   RxID:   [id number] CARVEDILOL 12.5 MG TABS (CARVEDILOL) 1 by mouth two times a day  #180 x 2   Entered by:   Lucious Groves CMA  Authorized by:   Nolon Rod. Gaila Engebretsen MD   Signed by:   Lucious Groves CMA on 05/07/2010   Method used:   Print then Give to Patient   RxID:   [id number]

## 2010-06-13 ENCOUNTER — Ambulatory Visit (INDEPENDENT_AMBULATORY_CARE_PROVIDER_SITE_OTHER): Payer: Medicare PPO | Admitting: Internal Medicine

## 2010-06-13 ENCOUNTER — Encounter: Payer: Self-pay | Admitting: Internal Medicine

## 2010-06-13 ENCOUNTER — Other Ambulatory Visit: Payer: Self-pay | Admitting: Internal Medicine

## 2010-06-13 DIAGNOSIS — M1990 Unspecified osteoarthritis, unspecified site: Secondary | ICD-10-CM

## 2010-06-13 DIAGNOSIS — I10 Essential (primary) hypertension: Secondary | ICD-10-CM

## 2010-06-13 DIAGNOSIS — E785 Hyperlipidemia, unspecified: Secondary | ICD-10-CM

## 2010-06-13 DIAGNOSIS — E119 Type 2 diabetes mellitus without complications: Secondary | ICD-10-CM

## 2010-06-13 LAB — BASIC METABOLIC PANEL
BUN: 10 mg/dL (ref 6–23)
CO2: 28 mEq/L (ref 19–32)
Calcium: 9.5 mg/dL (ref 8.4–10.5)
Chloride: 104 mEq/L (ref 96–112)
Creatinine, Ser: 1.1 mg/dL (ref 0.4–1.5)
GFR: 86.96 mL/min (ref >60.00)
Glucose, Bld: 127 mg/dL — ABNORMAL HIGH (ref 70–99)
Potassium: 4.1 mEq/L (ref 3.5–5.1)
Sodium: 140 mEq/L (ref 135–145)

## 2010-06-13 LAB — ALT: ALT: 24 U/L (ref 0–53)

## 2010-06-13 LAB — AST: AST: 25 U/L (ref 0–37)

## 2010-06-18 NOTE — Assessment & Plan Note (Signed)
Summary: rto 5 months/cbs   Vital Signs:  Patient profile:   76 year old male Height:      68 inches Weight:      193.25 pounds BMI:     29.49 Pulse rate:   74 / minute Pulse rhythm:   regular BP sitting:   132 / 82  (left arm) Cuff size:   large  Vitals Entered By: Army Fossa CMA (June 13, 2010 7:55 AM) CC: 5 month f/u- fasting  Comments on (R) hand he has a finger that "curls down, painful" [employer]    History of Present Illness: ROS trigger finger  for the last couple of weeks, at the 4 right finger h/o COPD, on no meds, asx  Diabetes mellitus , sees endocrine  Hypertension--  no ambulatory BPs  ,  good medication compliance Hyperlipidemia-- good medication compliance , we discussed amlodipine /simva interaction  ROS  no myalgias He denies chest pain or shortness of breath No nausea, vomiting, diarrhea  occasionally has urinary urgency, no dysuria or blood in the urine. Current today taking one doxazosin, last PSA-DRE  a few months ago normal    Current Medications (verified): 1)  Carvedilol 12.5 Mg Tabs (Carvedilol) .Marland Kitchen.. 1 By Mouth Two Times A Day 2)  Doxazosin Mesylate 4 Mg Tabs (Doxazosin Mesylate) .Marland Kitchen.. 1 By Mouth At Bedtime 3)  Losartan Potassium 50 Mg Tabs (Losartan Potassium) .... One Tablet Daily 4)  Amlodipine Besylate 10 Mg Tabs (Amlodipine Besylate) .Marland Kitchen.. 1 By Mouth Once Daily 5)  Simvastatin 80 Mg Tabs (Simvastatin) .... As Directed 6)  Metformin Hcl 500 Mg Tabs (Metformin Hcl) .... Two Times A Day 7)  Humalog Mix 75/25 Kwikpen 75-25 %  Susp (Insulin Lispro Prot & Lispro) .... 40 U in Pm 8)  Aspirin 81 Mg Tbec (Aspirin) 9)  Mvi 10)  Fish Oil .... Restart 04-26-08 11)  Vicodin 5-500 Mg Tabs (Hydrocodone-Acetaminophen) .Marland Kitchen.. 1 or 2 By Mouth Q 6 Hours 12)  Bd Pen Needle Short U/f 31g X 8 Mm Misc (Insulin Pen Needle) .... Pt Test  Two Times A Day Dx 250.00  Allergies (verified): No Known Drug Allergies  Past History:  Past Medical  History: Reviewed history from 01/11/2010 and no changes required. COPD Diabetes mellitus, type II Gout Hypertension Hyperlipidemia SLEEP APNEA  Osteoarthritis Benign prostatic hypertrophy  Past Surgical History: Reviewed history from 11/27/2006 and no changes required. toe surgery  Social History: Reviewed history from 01/11/2010 and no changes required. Married retired 2 children Regular exercise-- see HPI  diet--  no change since last year but trying to avoid excessive salt former smoker, 1 ppd, quit 2000 aprox  Physical Exam  General:  alert and well-developed.   Lungs:  normal respiratory effort, no intercostal retractions, no accessory muscle use, and slt decreased  breath sounds.   Heart:  normal rate, regular rhythm, no murmur, and no gallop.   Extremities:  no pretibial edema bilaterally   very mild trigger  finger phenomena on the fourth right digit Psych:  not anxious appearing and not depressed appearing.     Impression & Recommendations:  Problem # 1:  OSTEOARTHRITIS (ICD-715.90) trigger finger   recommend Aleve when necessary, if no better, will call for orthopedic surgical referral. warned about  GI s/e   His updated medication list for this problem includes:    Aspirin 81 Mg Tbec (Aspirin)    Vicodin 5-500 Mg Tabs (Hydrocodone-acetaminophen) .Marland Kitchen... 1 or 2 by mouth q 6 hours  Problem # 2:  HYPERLIPIDEMIA (ICD-272.4)   currently on simvastatin 40 mg  and  amlodipine. Doing well. Due to the  issue with interactions between these 2 medicines I will switch him to Lipitor. See instructions His updated medication list for this problem includes:    Atorvastatin Calcium 40 Mg Tabs (Atorvastatin calcium) .Marland Kitchen... As directed    Labs Reviewed: SGOT: 26 (10/10/2009)   SGPT: 25 (10/10/2009)   HDL:43.30 (10/10/2009), 34.80 (07/11/2008)  LDL:83 (10/10/2009), 77 (07/11/2008)  Chol:150 (10/10/2009), 139 (07/11/2008)  Trig:121.0 (10/10/2009), 137.0  (07/11/2008)  Orders: TLB-ALT (SGPT) (84460-ALT) TLB-AST (SGOT) (84450-SGOT) Specimen Handling (47829)  Problem # 3:  HYPERTENSION (ICD-401.9)  at goal  His updated medication list for this problem includes:    Carvedilol 12.5 Mg Tabs (Carvedilol) .Marland Kitchen... 1 by mouth two times a day    Doxazosin Mesylate 4 Mg Tabs (Doxazosin mesylate) .Marland Kitchen... 1 by mouth at bedtime    Losartan Potassium 50 Mg Tabs (Losartan potassium) ..... One tablet daily    Amlodipine Besylate 10 Mg Tabs (Amlodipine besylate) .Marland Kitchen... 1 by mouth once daily    BP today: 132/82 Prior BP: 128/82 (01/11/2010)  Labs Reviewed: K+: 4.6 (10/10/2009) Creat: : 1.3 (10/10/2009)   Chol: 150 (10/10/2009)   HDL: 43.30 (10/10/2009)   LDL: 83 (10/10/2009)   TG: 121.0 (10/10/2009)  Orders: TLB-BMP (Basic Metabolic Panel-BMET) (80048-METABOL) Venipuncture (56213) Specimen Handling (08657)  Problem # 4:  DIABETES MELLITUS, TYPE II (ICD-250.00) per endocrinology His updated medication list for this problem includes:    Losartan Potassium 50 Mg Tabs (Losartan potassium) ..... One tablet daily    Metformin Hcl 500 Mg Tabs (Metformin hcl) .Marland Kitchen..Marland Kitchen Two times a day    Humalog Mix 75/25 Kwikpen 75-25 % Susp (Insulin lispro prot & lispro) .Marland KitchenMarland KitchenMarland KitchenMarland Kitchen 40 u in pm    Aspirin 81 Mg Tbec (Aspirin)  Complete Medication List: 1)  Carvedilol 12.5 Mg Tabs (Carvedilol) .Marland Kitchen.. 1 by mouth two times a day 2)  Doxazosin Mesylate 4 Mg Tabs (Doxazosin mesylate) .Marland Kitchen.. 1 by mouth at bedtime 3)  Losartan Potassium 50 Mg Tabs (Losartan potassium) .... One tablet daily 4)  Amlodipine Besylate 10 Mg Tabs (Amlodipine besylate) .Marland Kitchen.. 1 by mouth once daily 5)  Atorvastatin Calcium 40 Mg Tabs (Atorvastatin calcium) .... As directed 6)  Metformin Hcl 500 Mg Tabs (Metformin hcl) .... Two times a day 7)  Humalog Mix 75/25 Kwikpen 75-25 % Susp (Insulin lispro prot & lispro) .... 40 u in pm 8)  Aspirin 81 Mg Tbec (Aspirin) 9)  Mvi  10)  Fish Oil  .... Restart 04-26-08 11)   Vicodin 5-500 Mg Tabs (Hydrocodone-acetaminophen) .Marland Kitchen.. 1 or 2 by mouth q 6 hours 12)  Bd Pen Needle Short U/f 31g X 8 Mm Misc (Insulin pen needle) .... Pt test  two times a day dx 250.00  Patient Instructions: 1)   finish all your simvastatin, then switch to Lipitor (ATORVASTATIN) 40mg   half tablet daily 2)   2 months after you switch, come back for office visit, fasting. 3)   If Lipitor is too expensive, let me know Prescriptions: ATORVASTATIN CALCIUM 40 MG TABS (ATORVASTATIN CALCIUM) as directed  #30 x 6   Entered and Authorized by:   Elita Quick E. Dhrithi Riche MD   Signed by:   Nolon Rod. Bret Vanessen MD on 06/13/2010   Method used:   Print then Give to Patient   RxID:   [id number]    Orders Added: 1)  TLB-ALT (SGPT) [84460-ALT] 2)  TLB-AST (SGOT) [84450-SGOT] 3)  TLB-BMP (Basic Metabolic Panel-BMET) [80048-METABOL] 4)  Venipuncture [16109] 5)  Specimen Handling [99000] 6)  Est. Patient Level IV [60454]

## 2010-08-23 NOTE — Op Note (Signed)
NAME:  Bradley Johns, Bradley Johns                         ACCOUNT NO.:  [id number]   MEDICAL RECORD NO.:  [id number]                   PATIENT TYPE:  AMB   LOCATION:  ENDO                                 FACILITY:  [facility]   PHYSICIAN:  Anselmo Rod, M.D.               DATE OF BIRTH:  09/08/1934   DATE OF PROCEDURE:  08/04/2003  DATE OF DISCHARGE:                                 OPERATIVE REPORT   PROCEDURE PERFORMED:  Colonoscopy, changed to a flexible sigmoidoscopy.   ENDOSCOPIST:  Anselmo Rod, M.D.   INSTRUMENT USED:  Olympus video colonoscope.   INDICATION FOR PROCEDURE:  A 76 year old African-American male undergoing  screening colonoscopy.  Rule out colonic polyps, masses, etc.   PREPROCEDURE PREPARATION:  Informed consent was procured from the patient.  The patient was fasted for eight hours prior to the procedure and prepped  with a bottle of magnesium citrate and a gallon of GoLYTELY the night prior  to the procedure.   PREPROCEDURE PHYSICAL:  VITAL SIGNS:  The patient had stable vital signs.  NECK:  Supple.  CHEST:  Clear to auscultation.  S1, S2 regular.  ABDOMEN:  Soft with normal bowel sounds.   DESCRIPTION OF PROCEDURE:  The patient was placed in the left lateral  decubitus position and sedated with 50 mg of Demerol and 5 mg of Versed  intravenously.  Once the patient was adequately sedate and maintained on low-  flow oxygen and continuous cardiac monitoring, the Olympus video colonoscope  was advanced from the rectum to 20 cm with difficulty.  There was solid  stool in the colon.  The procedure was aborted at this point with plans to  re-prep the patient and redo the procedure at a later date.                                               Anselmo Rod, M.D.    JNM/MEDQ  D:  08/04/2003  T:  08/04/2003  Job:  [id number]   cc:   Thelma Barge P. Modesto Charon, M.D.  [address]  [place]  [place] [address]  Fax: [phone]

## 2010-08-23 NOTE — Cardiovascular Report (Signed)
[facility]  Patient:    Bradley Johns, Bradley Johns                        MRN: [id number] Proc. Date: 05/15/99 Adm. Date:  [id number] Attending:  Meade Maw A CC:         Redmond Baseman, M.D.                        Cardiac Catheterization  PROCEDURE PERFORMED:  Left heart catheterization, coronary angiography.  OPERATOR:  Meade Maw, M.D.  REFERRING PHYSICIAN:  Redmond Baseman, M.D.  INDICATIONS FOR PROCEDURE:  The patient is a 76 year old male who was referred or further evaluation of his dyspnea.  He has significant risk factors for coronary artery disease, positive family history, hypertension, hypercholesterolemia, tobacco abuse, male sex and age.  A stress Cardiolite was performed.  The stress Cardiolite revealed decreased uptake in the anterior and inferior wall which was suggestive of multivessel disease.  A gated study revealed an ejection fraction of 48%.  The patient exercised for six minutes and 30 seconds and had no significant ECG changes.  It was felt based on the poor exercise tolerance and mild decreased uptake, left heart catheterization was indicated.  The risks and benefits were discussed with the patient.  DESCRIPTION OF PROCEDURE:  The patient was brought to the cardiac catheterization lab in the postabsorptive state.  Preop sedation was achieved using IV Versed and IV Benadryl.  The right femoral head was identified using radiographic technique. A 6 French hemostasis sheath was placed into the right femoral artery using a modified Seldinger technique.  Selective coronary angiography was performed using a JL4, JR4 Judkins catheter.  Nonionic contrast was used and injected for coronaries. A single plane ventriculogram was performed in the RAO position using a 6 French pigtail curved catheter.  Nonionic contrast was used and was hand injected. All catheter exchanges were made over a guide wire.  The  hemostasis sheath was removed and hemostasis was achieved using digital pressure.  FINDINGS:  The aortic pressure was 137/79, the LV pressure was 137/13, the single plane ventriculogram revealed normal wall motion with an ejection fraction of 65%. 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  Coronary angiography, left main coronary artery bifurcated into the left anterior descending and circumflex vessel.  There was no significant disease in the left main coronary artery.  Left anterior descending.  The left anterior descending gave rise to a large D1, small D2, small D3 and a moderate size D4.  There was also a large ____________  septal perforator off the LAD.  There was no significant disease in the LAD or ts branches.  Circumflex vessel.  The circumflex vessel was a small vessel giving rise to moderate OM1 and then a small AV groove vessel. There was no significant disease in the circumflex vessel.  Right coronary artery.  The right coronary artery was a large vessel.  Gave rise to small RV marginals, a moderate-sized PDA and a large PL brach.  IMPRESSION: 1. Normal coronary arteries. 2. Normal LV-gram. 3. Dyspnea most likely explained by his emphysema.  RECOMMENDATIONS:  Primary risk prevention ____________ of less than 160. Patient is also encouraged to continue with tobacco cessation and hypertension control. DD:  05/15/99 TD:  05/15/99 Job: [id number]

## 2010-08-23 NOTE — Op Note (Signed)
NAME:  Bradley Johns, Bradley Johns                         ACCOUNT NO.:  [id number]   MEDICAL RECORD NO.:  [id number]                   PATIENT TYPE:  AMB   LOCATION:  ENDO                                 FACILITY:  [facility]   PHYSICIAN:  Anselmo Rod, M.D.               DATE OF BIRTH:  07-11-1934   DATE OF PROCEDURE:  09/13/2003  DATE OF DISCHARGE:                                 OPERATIVE REPORT   PROCEDURE:  Colonoscopy with snare polypectomy x 2.   ENDOSCOPIST:  Charna Elizabeth, M.D.   INSTRUMENT USED:  Olympus video colonoscope.   INDICATIONS FOR PROCEDURE:  76 year old African American male with a history  of diabetes undergoing screening colonoscopy to rule out colonic polyps,  masses, etc.   PREPROCEDURE PREPARATION:  Informed consent was obtained from the patient.  The patient was fasted for eight hours prior to the procedure and prepped  with a bottle of magnesium citrate and a gallon of GoLYTELY the night prior  to the procedure.   PREPROCEDURE PHYSICAL:  Patient with stable vital signs.  Neck supple.  Chest clear to auscultation.  S1 and S2 regular.  Abdomen soft with normal  bowel sounds.   DESCRIPTION OF PROCEDURE:  The patient was placed in the left lateral  decubitus position, sedated with 100 mg of Demerol and 7.5 mg Versed in slow  incremental doses.  Once the patient was adequately sedated, maintained on  low flow oxygen and continuous cardiac monitoring, the Olympus video  colonoscope was advanced from the rectum to the cecum with difficulty.  The  patient's position was changed from the left lateral to supine and right  lateral position with gentle application of abdominal pressure to reach the  cecum.  The appendiceal orifice and ileocecal valve were visualized.  There  were a few scattered diverticula throughout the colon.  A small sessile  polyp was snared from the mid rectum and another sessile polyp was snared  from the rectosigmoid colon.  Small internal hemorrhoids  were seen on  retroflexion.  No other masses or polyps were identified.  The patient  tolerated the procedure well without complications.  There was some residual  stool in the colon in spite of a repeat prep.   IMPRESSION:  1. A few early scattered diverticula.  2. Two small sessile polyps snared, one from the mid right colon, one from     the rectosigmoid colon.  3. Small internal hemorrhoids.  4. Some residual stool.   RECOMMENDATIONS:  1. Await pathology results.  2. Avoid nonsteroidals for now.  3. High fiber diet with liberal fluid intake.  4. Outpatient follow up in the next two weeks for further recommendations.  Anselmo Rod, M.D.    JNM/MEDQ  D:  09/13/2003  T:  09/13/2003  Job:  [id number]   cc:   Thelma Barge P. Modesto Charon, M.D.  [address]  [place]  [place] [address]  Fax: [phone]

## 2010-11-01 ENCOUNTER — Other Ambulatory Visit: Payer: Self-pay | Admitting: Internal Medicine

## 2010-11-04 ENCOUNTER — Other Ambulatory Visit: Payer: Self-pay | Admitting: Internal Medicine

## 2010-11-04 MED ORDER — LOSARTAN POTASSIUM 50 MG PO TABS: 50.0000 mg | ORAL_TABLET | Freq: Every day | ORAL | Status: DC

## 2010-11-04 NOTE — Telephone Encounter (Signed)
Sent in

## 2010-12-19 ENCOUNTER — Encounter: Payer: Self-pay | Admitting: Internal Medicine

## 2010-12-19 ENCOUNTER — Ambulatory Visit (INDEPENDENT_AMBULATORY_CARE_PROVIDER_SITE_OTHER): Payer: Medicare PPO | Admitting: Internal Medicine

## 2010-12-19 VITALS — BP 148/80 | Temp 98.6°F | Wt 199.0 lb

## 2010-12-19 DIAGNOSIS — I10 Essential (primary) hypertension: Secondary | ICD-10-CM

## 2010-12-19 DIAGNOSIS — E119 Type 2 diabetes mellitus without complications: Secondary | ICD-10-CM

## 2010-12-19 DIAGNOSIS — N400 Enlarged prostate without lower urinary tract symptoms: Secondary | ICD-10-CM

## 2010-12-19 DIAGNOSIS — E785 Hyperlipidemia, unspecified: Secondary | ICD-10-CM

## 2010-12-19 LAB — POCT URINALYSIS DIPSTICK
Bilirubin, UA: NEGATIVE
Glucose, UA: NEGATIVE
Ketones, UA: NEGATIVE
Leukocytes, UA: NEGATIVE
Nitrite, UA: NEGATIVE
Spec Grav, UA: 1.015
Urobilinogen, UA: NEGATIVE
pH, UA: 6

## 2010-12-19 MED ORDER — SOLIFENACIN SUCCINATE 5 MG PO TABS: 5.0000 mg | ORAL_TABLET | Freq: Every day | ORAL | Status: DC

## 2010-12-19 NOTE — Assessment & Plan Note (Signed)
.  per endocrinology 

## 2010-12-19 NOTE — Assessment & Plan Note (Signed)
Still on simvastatin, planning to switch to Lipitor. See instructions

## 2010-12-19 NOTE — Assessment & Plan Note (Signed)
Continue with nocturia, 2 episodes of bladder incontinence at night. DRE unchanged, last PSA normal. Plan:  urine culture Trial with vesicare

## 2010-12-19 NOTE — Assessment & Plan Note (Signed)
Well-controlled? Strongly recommend to check his BPs, see  instructions

## 2010-12-19 NOTE — Progress Notes (Signed)
  Subjective:    Patient ID: Bradley Johns, male    DOB: Oct 05, 1934, 76 y.o.   MRN: [id number]  HPI ROV HTN-- good med compliance , amb BPs checked rarely, does not recall reading DM--  good medication compliance, amb CBG ~ 172 in AM, in the afternoon often times > 200 High cholesterol--all simvastatin, will switch to Lipitor as soon as he finished his simvastatin supply. See last office visit    Past Medical History: COPD Diabetes mellitus, type II Gout Hypertension Hyperlipidemia SLEEP APNEA  Osteoarthritis Benign prostatic hypertrophy  Past Surgical History: toe surgery  Review of Systems No change in his shortness of breath No cough or hemoptysis Reports that last week he had 2 episodes of urinary incontinence at night. Continue with ongoing nocturia, needs to get 3 of 4 times at night. He does have frequency during the daytime. No dysuria or difficulty urinating. No gross hematuria. Denies any back pain or rash in the back     Objective:   Physical Exam  Constitutional: He is oriented to person, place, and time. He appears well-developed and well-nourished.  HENT:  Head: Normocephalic and atraumatic.  Right Ear: External ear normal.  Cardiovascular: Normal rate, regular rhythm and normal heart sounds.   No murmur heard. Pulmonary/Chest: Effort normal and breath sounds normal. No respiratory distress. He has no wheezes.  Genitourinary: Rectum normal.       Prostate moderately enlarged without tenderness or nodules  Musculoskeletal: He exhibits no edema.  Neurological: He is alert and oriented to person, place, and time.          Assessment & Plan:

## 2010-12-19 NOTE — Patient Instructions (Signed)
Check the  blood pressure 2 or 3 times a week, be sure it is less than 135/85. If it is consistently higher, let me know Start VESIcare 5 mg one tablet at bedtime, if after a week you to see a difference, you can take 2 tablets and see if that helps. Call if severe side effects such as constipation or dry mouth. Within a month,  change from simvastatin to Lipitor Come back in 3 months for your physical exam

## 2010-12-21 LAB — URINE CULTURE
Colony Count: NO GROWTH
Organism ID, Bacteria: NO GROWTH

## 2011-02-08 ENCOUNTER — Other Ambulatory Visit: Payer: Self-pay | Admitting: Internal Medicine

## 2011-02-18 ENCOUNTER — Other Ambulatory Visit: Payer: Self-pay | Admitting: Internal Medicine

## 2011-02-19 MED ORDER — ATORVASTATIN CALCIUM 40 MG PO TABS: 40.0000 mg | ORAL_TABLET | Freq: Every day | ORAL | Status: DC

## 2011-02-19 NOTE — Telephone Encounter (Signed)
Rx for Liptor 40 mg sent to pharmacy on file

## 2011-03-17 ENCOUNTER — Encounter: Payer: Self-pay | Admitting: Internal Medicine

## 2011-03-18 ENCOUNTER — Ambulatory Visit (INDEPENDENT_AMBULATORY_CARE_PROVIDER_SITE_OTHER): Payer: Medicare PPO | Admitting: Internal Medicine

## 2011-03-18 DIAGNOSIS — I10 Essential (primary) hypertension: Secondary | ICD-10-CM

## 2011-03-18 DIAGNOSIS — E119 Type 2 diabetes mellitus without complications: Secondary | ICD-10-CM

## 2011-03-18 DIAGNOSIS — Z23 Encounter for immunization: Secondary | ICD-10-CM

## 2011-03-18 DIAGNOSIS — Z0000 Encounter for general adult medical examination without abnormal findings: Secondary | ICD-10-CM

## 2011-03-18 DIAGNOSIS — N400 Enlarged prostate without lower urinary tract symptoms: Secondary | ICD-10-CM

## 2011-03-18 DIAGNOSIS — E785 Hyperlipidemia, unspecified: Secondary | ICD-10-CM

## 2011-03-18 LAB — CBC WITH DIFFERENTIAL/PLATELET
Basophils Absolute: 0 10*3/uL (ref 0.0–0.1)
Basophils Relative: 0.5 % (ref 0.0–3.0)
Eosinophils Absolute: 0.2 10*3/uL (ref 0.0–0.7)
Eosinophils Relative: 2.5 % (ref 0.0–5.0)
HCT: 43.9 % (ref 39.0–52.0)
Hemoglobin: 14.9 g/dL (ref 13.0–17.0)
Lymphocytes Relative: 26.8 % (ref 12.0–46.0)
Lymphs Abs: 1.9 10*3/uL (ref 0.7–4.0)
MCHC: 33.8 g/dL (ref 30.0–36.0)
MCV: 90.4 fl (ref 78.0–100.0)
Monocytes Absolute: 0.7 10*3/uL (ref 0.1–1.0)
Monocytes Relative: 9.4 % (ref 3.0–12.0)
Neutro Abs: 4.3 10*3/uL (ref 1.4–7.7)
Neutrophils Relative %: 60.8 % (ref 43.0–77.0)
Platelets: 210 10*3/uL (ref 150.0–400.0)
RBC: 4.86 Mil/uL (ref 4.22–5.81)
RDW: 14.3 % (ref 11.5–14.6)
WBC: 7.1 10*3/uL (ref 4.5–10.5)

## 2011-03-18 MED ORDER — ZOSTER VACCINE LIVE 19400 UNT/0.65ML ~~LOC~~ SOLR: 0.6500 mL | Freq: Once | SUBCUTANEOUS | Status: AC

## 2011-03-18 NOTE — Assessment & Plan Note (Addendum)
Td 01 and 2011, pneumonia shot 2001  and 2010 shingles shot, discussed , not interested at this point , Rx provided, benefits explained flu shot today   had a Cscope 5 or 6 years ago (Dr Loreta Ave), again Cscope 4-11 (had a + hemocult), bx ----> tubular adenoma, next Cscope per GI  diet, exercise discussed  Labs-----------------needs written copy

## 2011-03-18 NOTE — Assessment & Plan Note (Addendum)
Still on simva, 40 mg a day  (was recommended to increase the dose but didn't) He is also on norvasc, as long as FLP ok and he continue free of side effects , won't change to lipitor

## 2011-03-18 NOTE — Assessment & Plan Note (Addendum)
Sx stable, DRE---> slt enlarged prostate , labs Cont cardura

## 2011-03-18 NOTE — Assessment & Plan Note (Signed)
No change 

## 2011-03-18 NOTE — Progress Notes (Signed)
  Subjective:    Patient ID: Bradley Johns, male    DOB: 03-20-1935, 76 y.o.   MRN: [id number]  HPI Here for Medicare AWV: 1. Risk factors based on Past M, S, F history:reviewed  2. Physical Activities: more  walking than before , rarely goes to the gym. 3. Depression/mood: denies problems, mood seems well today 4. Hearing: no complaints, no problems noted  5. ADL's: totally independent  6. Fall Risk: low risk, no h/o falls , counseled  7. Home Safety: does feel safe at home  8. Height, weight, &visual acuity: see VS, vision corrected , sees eye doctor routinely  9. Counseling: yes , see below  10. Labs ordered based on risk factors: yes  11.           Referral Coordination: if needed  12.           Care Plan: see a/p  13.            Cognitive Assessment , motor skills, cognition and memory seem appropriate  In addition, we discussed the following issues Needs all Rx re sent  Diabetes -- per endocrinology, Dr Allena Katz but likes me to take over, see a/p ; CBGs fluctuates  Gout-- uses colcrys rarely , ?once a month Hypertension-- ambulatory BPs average 137/67 , good medication compliance, diet is regular Hyperlipidemia-- still on simva  40 mg a day Benign prostatic hypertrophy--  nocturia x 2, no difficulty urinating    Past Medical History: COPD Diabetes mellitus, type II Gout Hypertension Hyperlipidemia SLEEP APNEA  Osteoarthritis Benign prostatic hypertrophy  Past Surgical History: toe surgery  Family History: colon ca--no prostate ca--no Diabetes-- sister  Liver disease--M CAD-- F  onset in his 52s   Social History: Married retired 2 children Regular exercise-- see HPI  diet--  no change since last year but trying to avoid excessive salt former smoker, 1 ppd, quit 2000 aprox ETOH-- rarely   Review of Systems  Constitutional:       Recovering from a cold  Respiratory: Negative for cough, shortness of breath and wheezing.   Cardiovascular: Negative for chest  pain and leg swelling.  Gastrointestinal: Negative for nausea, vomiting, abdominal pain and blood in stool.  Genitourinary: Negative for dysuria and hematuria.       Objective:   Physical Exam  Constitutional: He is oriented to person, place, and time. He appears well-developed. No distress.  HENT:  Head: Normocephalic and atraumatic.       Face symmetric, no TTP, nose not congested   Cardiovascular: Normal rate, regular rhythm and normal heart sounds.   No murmur heard. Pulmonary/Chest: Effort normal and breath sounds normal. No respiratory distress. He has no wheezes. He has no rales.  Abdominal: Soft. Bowel sounds are normal. He exhibits no distension. There is no tenderness. There is no rebound and no guarding.  Genitourinary: Rectum normal.       slt enalrged prostate, symmetric, no nodular   Musculoskeletal: He exhibits no edema.  Neurological: He is alert and oriented to person, place, and time.  Skin: He is not diaphoretic.  Psychiatric: He has a normal mood and affect. His behavior is normal. Judgment and thought content normal.      Assessment & Plan:

## 2011-03-18 NOTE — Assessment & Plan Note (Addendum)
Per endocrinology, on insulin. Likes to transfer his care here----->  rec to see endocrinology again, if well controlled ok to tranfer back (a1c was 15 in 2008 when I referred him)

## 2011-03-19 ENCOUNTER — Other Ambulatory Visit: Payer: Self-pay | Admitting: *Deleted

## 2011-03-19 LAB — PSA: PSA: 0.58 ng/mL (ref 0.10–4.00)

## 2011-03-19 LAB — LIPID PANEL
Cholesterol: 142 mg/dL (ref 0–200)
HDL: 33.5 mg/dL — ABNORMAL LOW (ref >39.00)
LDL Cholesterol: 78 mg/dL (ref 0–99)
Total CHOL/HDL Ratio: 4
Triglycerides: 154 mg/dL — ABNORMAL HIGH (ref 0.0–149.0)
VLDL: 30.8 mg/dL (ref 0.0–40.0)

## 2011-03-19 LAB — BASIC METABOLIC PANEL
BUN: 15 mg/dL (ref 6–23)
CO2: 31 mEq/L (ref 19–32)
Calcium: 9.8 mg/dL (ref 8.4–10.5)
Chloride: 101 mEq/L (ref 96–112)
Creatinine, Ser: 1.1 mg/dL (ref 0.4–1.5)
GFR: 87.72 mL/min (ref >60.00)
Glucose, Bld: 151 mg/dL — ABNORMAL HIGH (ref 70–99)
Potassium: 4.5 mEq/L (ref 3.5–5.1)
Sodium: 139 mEq/L (ref 135–145)

## 2011-03-19 LAB — TSH: TSH: 3.79 u[IU]/mL (ref 0.35–5.50)

## 2011-03-19 MED ORDER — SIMVASTATIN 80 MG PO TABS: 40.0000 mg | ORAL_TABLET | Freq: Every day | ORAL | Status: DC

## 2011-03-19 MED ORDER — CARVEDILOL 12.5 MG PO TABS: ORAL_TABLET | ORAL | Status: DC

## 2011-03-19 MED ORDER — AMLODIPINE BESYLATE 10 MG PO TABS: ORAL_TABLET | ORAL | Status: DC

## 2011-03-19 MED ORDER — LOSARTAN POTASSIUM 50 MG PO TABS: 50.0000 mg | ORAL_TABLET | Freq: Every day | ORAL | Status: DC

## 2011-03-19 MED ORDER — COLCHICINE 0.6 MG PO TABS: 0.6000 mg | ORAL_TABLET | Freq: Two times a day (BID) | ORAL | Status: DC | PRN

## 2011-03-19 MED ORDER — DOXAZOSIN MESYLATE 4 MG PO TABS: ORAL_TABLET | ORAL | Status: DC

## 2011-03-19 NOTE — Telephone Encounter (Deleted)
rx sent

## 2011-03-19 NOTE — Telephone Encounter (Signed)
Rx sent 

## 2011-03-19 NOTE — Telephone Encounter (Signed)
Message copied by Verdene Rio on Wed Mar 19, 2011  5:33 PM ------      Message from: Willow Ora E      Created: Tue Mar 18, 2011  5:00 PM       Needs all Rx RF (except the ones for diabetes ) for one year. Please ask the patient were to send the Rxs

## 2011-03-20 ENCOUNTER — Encounter: Payer: Self-pay | Admitting: Internal Medicine

## 2011-05-30 ENCOUNTER — Other Ambulatory Visit: Payer: Self-pay | Admitting: *Deleted

## 2011-05-30 MED ORDER — HYDROCODONE-ACETAMINOPHEN 5-500 MG PO TABS: 1.0000 | ORAL_TABLET | Freq: Four times a day (QID) | ORAL | Status: AC | PRN

## 2011-05-30 NOTE — Telephone Encounter (Signed)
Refill done.  

## 2011-05-30 NOTE — Telephone Encounter (Signed)
Pt called stating he needs a refill on Hydrocodone. I do not see this on his med list. Please advise.

## 2011-05-30 NOTE — Telephone Encounter (Signed)
Chart reviewed, he has used Vicodin before. Okay to call Vicodin 5 /500 one tablet 4 times a day when necessary. #60 no refills

## 2011-06-18 ENCOUNTER — Telehealth: Payer: Self-pay | Admitting: Internal Medicine

## 2011-06-18 NOTE — Telephone Encounter (Signed)
Refill for  Atorvastatin Calcium 40MG  Tab  Qty 30 Take 1-tablet by mouth  Last fill date 2.12.13

## 2011-06-19 MED ORDER — SIMVASTATIN 80 MG PO TABS: 40.0000 mg | ORAL_TABLET | Freq: Every day | ORAL | Status: DC

## 2011-06-19 NOTE — Telephone Encounter (Signed)
OK to refill? I do not see on med list.

## 2011-06-19 NOTE — Telephone Encounter (Signed)
Refill done.  

## 2011-06-19 NOTE — Telephone Encounter (Signed)
To my knowledge, he is still on simvastatin 80 mg half tablet daily. Okay to refill simvastatin  X 6 months

## 2011-06-21 ENCOUNTER — Other Ambulatory Visit: Payer: Self-pay | Admitting: Internal Medicine

## 2011-06-23 NOTE — Telephone Encounter (Signed)
Spoke with pt & he states you had put him on atvorstatin 40mg . Pt would like to know if he's suppose to take this or take simvistation 80mg  0.5 tablets daily. Please advise.

## 2011-06-24 ENCOUNTER — Telehealth: Payer: Self-pay | Admitting: Internal Medicine

## 2011-06-24 NOTE — Telephone Encounter (Signed)
Opened in error

## 2011-06-30 NOTE — Telephone Encounter (Signed)
Discuss with patient, med list updated. 

## 2011-06-30 NOTE — Telephone Encounter (Signed)
After review med list Rx was sent for Lipitor 40 mg 1 tab daily on 02-19-11 #30 3 by Tameshia. Pt indicated that he has been on Lipitor since then and needs to verify how he is to take med is it half a tab or a whole tab.

## 2011-06-30 NOTE — Telephone Encounter (Signed)
Addended by: Candie Echevaria L on: 06/30/2011 01:26 PM   Modules accepted: Orders

## 2011-06-30 NOTE — Telephone Encounter (Signed)
I reviewed the chart, he indeed was supposed to switch from simvastatin to Lipitor 40 milligram 1 by mouth daily. Please correct the med list and tell pt  the right dose is one tablet daily.

## 2011-09-12 NOTE — Telephone Encounter (Signed)
Refill done.  

## 2011-09-12 NOTE — Telephone Encounter (Signed)
See phone note 06-18-11

## 2011-09-17 ENCOUNTER — Ambulatory Visit (INDEPENDENT_AMBULATORY_CARE_PROVIDER_SITE_OTHER): Payer: Medicare PPO | Admitting: Internal Medicine

## 2011-09-17 VITALS — BP 142/84 | HR 78 | Temp 97.9°F | Wt 195.0 lb

## 2011-09-17 DIAGNOSIS — I10 Essential (primary) hypertension: Secondary | ICD-10-CM

## 2011-09-17 DIAGNOSIS — M1990 Unspecified osteoarthritis, unspecified site: Secondary | ICD-10-CM

## 2011-09-17 DIAGNOSIS — J449 Chronic obstructive pulmonary disease, unspecified: Secondary | ICD-10-CM

## 2011-09-17 DIAGNOSIS — E785 Hyperlipidemia, unspecified: Secondary | ICD-10-CM

## 2011-09-17 LAB — LIPID PANEL
Cholesterol: 191 mg/dL (ref 0–200)
HDL: 39.3 mg/dL (ref >39.00)
Total CHOL/HDL Ratio: 5
Triglycerides: 269 mg/dL — ABNORMAL HIGH (ref 0.0–149.0)
VLDL: 53.8 mg/dL — ABNORMAL HIGH (ref 0.0–40.0)

## 2011-09-17 LAB — BASIC METABOLIC PANEL
BUN: 17 mg/dL (ref 6–23)
CO2: 26 mEq/L (ref 19–32)
Calcium: 9.2 mg/dL (ref 8.4–10.5)
Chloride: 100 mEq/L (ref 96–112)
Creatinine, Ser: 1 mg/dL (ref 0.4–1.5)
GFR: 91.58 mL/min (ref >60.00)
Glucose, Bld: 191 mg/dL — ABNORMAL HIGH (ref 70–99)
Potassium: 4.2 mEq/L (ref 3.5–5.1)
Sodium: 136 mEq/L (ref 135–145)

## 2011-09-17 LAB — LDL CHOLESTEROL, DIRECT: Direct LDL: 117.2 mg/dL

## 2011-09-17 LAB — AST: AST: 27 U/L (ref 0–37)

## 2011-09-17 LAB — ALT: ALT: 32 U/L (ref 0–53)

## 2011-09-17 MED ORDER — ATORVASTATIN CALCIUM 40 MG PO TABS: 40.0000 mg | ORAL_TABLET | Freq: Every day | ORAL | Status: DC

## 2011-09-17 NOTE — Progress Notes (Signed)
  Subjective:    Patient ID: Bradley Johns, male    DOB: [birth year], 77 y.o.   MRN: [id number]  HPI Routine office visit Complains of pain at the left hand, base of the second and third fingers. Some difficulty with finger motion.  Past Medical History:  COPD  Diabetes mellitus, type II  Gout  Hypertension  Hyperlipidemia  SLEEP APNEA  Osteoarthritis  Benign prostatic hypertrophy  Past Surgical History:  toe surgery  Family History:  colon ca--no  prostate ca--no  Diabetes-- sister  Liver disease--M  CAD-- F onset in his 67s  Social History:  Married  retired  2 children  Regular exercise-- see HPI  diet-- no change since last year but trying to avoid excessive salt  former smoker, 1 ppd, quit 2000 aprox  ETOH-- rarely    Review of Systems No chest pain or shortness of breath No lower extremity edema Good compliance with medications, ambulatory BPs around 130/70. Rarely uses hydrocodone for pain. Pain is mostly due to to mild gout episodes or arthritis    Objective:   Physical Exam General -- alert, well-developed.No apparent distress.  Lungs -- normal respiratory effort, no intercostal retractions, no accessory muscle use, and normal breath sounds.   Heart-- normal rate, regular rhythm, no murmur, and no gallop.   Extremities--  no pretibial edema bilaterally  Palpation of wrists and elbows without evidence of synovitis or inflammation. Right hand normal Left hand tender at the base of the second and third fingers. Range of motion of those fingers limited. Neurologic-- alert & oriented X3 and strength normal in all extremities. Psych-- Cognition and judgment appear intact. Alert and cooperative with normal attention span and concentration.  not anxious appearing and not depressed appearing.       Assessment & Plan:

## 2011-09-17 NOTE — Assessment & Plan Note (Signed)
History of DJD, now complains of pain at the left hand. Refer to sports medicine

## 2011-09-17 NOTE — Assessment & Plan Note (Signed)
Documented by PFTs 10/2009. Essentially asymptomatic

## 2011-09-17 NOTE — Assessment & Plan Note (Signed)
Good compliance with Lipitor, labs 

## 2011-09-17 NOTE — Assessment & Plan Note (Signed)
Well-controlled,  check a BMP 

## 2011-09-18 ENCOUNTER — Encounter: Payer: Self-pay | Admitting: Internal Medicine

## 2011-09-19 ENCOUNTER — Encounter: Payer: Self-pay | Admitting: *Deleted

## 2011-09-19 MED ORDER — EZETIMIBE 10 MG PO TABS: 10.0000 mg | ORAL_TABLET | Freq: Every day | ORAL | Status: DC

## 2011-09-19 NOTE — Addendum Note (Signed)
Addended by: Edwena Felty T on: 09/19/2011 05:03 PM   Modules accepted: Orders

## 2011-09-23 ENCOUNTER — Ambulatory Visit: Payer: Medicare PPO | Admitting: Family Medicine

## 2011-09-25 ENCOUNTER — Encounter: Payer: Self-pay | Admitting: Family Medicine

## 2011-09-25 ENCOUNTER — Ambulatory Visit (INDEPENDENT_AMBULATORY_CARE_PROVIDER_SITE_OTHER): Payer: Medicare PPO | Admitting: Family Medicine

## 2011-09-25 VITALS — BP 150/76 | HR 76 | Temp 98.4°F | Ht 69.0 in | Wt 196.0 lb

## 2011-09-25 DIAGNOSIS — IMO0002 Reserved for concepts with insufficient information to code with codable children: Secondary | ICD-10-CM

## 2011-09-25 DIAGNOSIS — M6530 Trigger finger, unspecified finger: Secondary | ICD-10-CM

## 2011-09-26 ENCOUNTER — Encounter: Payer: Self-pay | Admitting: Family Medicine

## 2011-09-26 DIAGNOSIS — IMO0002 Reserved for concepts with insufficient information to code with codable children: Secondary | ICD-10-CM | POA: Insufficient documentation

## 2011-09-26 NOTE — Assessment & Plan Note (Signed)
Patient declined injections today at level of A1 pulley.  Will start oral nsaid and try immobilization - shown how to use adhesive bandages to minimize motion at PIP joints to keep in extension without completely affecting motion.  F/u in 4-6 weeks for reevaluation.  Consider injections if not improving.  Handout provided.

## 2011-09-26 NOTE — Progress Notes (Signed)
Subjective:    Patient ID: Bradley Johns, male    DOB: [birth year], 77 y.o.   MRN: [id number]  PCP: Dr. Drue Novel  HPI 77 yo M here for bilateral hand pain.  Patient denies known injury. States about 3 months ago started developing stiffness when making a fist especially in left index and right pinky fingers. Has worsened over that time. Now when wakes up in morning, these digits are locked in flexion and has to pry them open. Has not tried anything other than squeezing a stress ball. Not icing or taking any medicines. No prior issues. Left handed.  Past Medical History  Diagnosis Date  . COPD (chronic obstructive pulmonary disease)   . Diabetes mellitus     2  . Gout   . Hypertension   . Hyperlipidemia   . Sleep apnea   . Arthritis   . Benign prostatic hypertrophy     Current Outpatient Prescriptions on File Prior to Visit  Medication Sig Dispense Refill  . amLODipine (NORVASC) 10 MG tablet TAKE 1 TABLET ONCE DAILY  90 tablet  3  . aspirin 81 MG tablet Take 81 mg by mouth daily.        Marland Kitchen atorvastatin (LIPITOR) 40 MG tablet Take 1 tablet (40 mg total) by mouth daily.  90 tablet  3  . carvedilol (COREG) 12.5 MG tablet TAKE 1 TABLET TWICE DAILY  180 tablet  3  . colchicine 0.6 MG tablet Take 1 tablet (0.6 mg total) by mouth 2 (two) times daily as needed.  90 tablet  3  . doxazosin (CARDURA) 4 MG tablet TAKE 1 TABLET AT BEDTIME  90 tablet  3  . HYDROcodone-acetaminophen (VICODIN) 5-500 MG per tablet Take 1 tablet by mouth every 6 (six) hours as needed.      . Insulin Lispro Prot & Lispro (HUMALOG MIX 75/25 PEN [place]) Inject 38 Units into the skin daily.        Marland Kitchen losartan (COZAAR) 50 MG tablet Take 1 tablet (50 mg total) by mouth daily.  90 tablet  3  . metFORMIN (GLUCOPHAGE) 500 MG tablet Take 500 mg by mouth 2 (two) times daily with a meal.        . Multiple Vitamin (MULTIVITAMIN) tablet Take 1 tablet by mouth daily.        Marland Kitchen ezetimibe (ZETIA) 10 MG tablet Take 1 tablet (10 mg total)  by mouth daily.  90 tablet  1  . DISCONTD: simvastatin (ZOCOR) 80 MG tablet Take 0.5 tablets (40 mg total) by mouth at bedtime.  90 tablet  3    Past Surgical History  Procedure Date  . Toe surgery     No Known Allergies  History   Social History  . Marital Status: Married    Spouse Name: N/A    Number of Children: N/A  . Years of Education: N/A   Occupational History  . Not on file.   Social History Main Topics  . Smoking status: Former Games developer  . Smokeless tobacco: Not on file  . Alcohol Use: Yes     occassional beer  . Drug Use: Not on file  . Sexually Active: Not on file   Other Topics Concern  . Not on file   Social History Narrative  . No narrative on file    Family History  Problem Relation Age of Onset  . Liver disease Mother   . Coronary artery disease Father   . Diabetes Sister   . Sudden death  Neg Hx   . Hypertension Neg Hx   . Hyperlipidemia Neg Hx   . Heart attack Neg Hx     BP 150/76  Pulse 76  Temp 98.4 F (36.9 C) (Oral)  Ht 5\' 9"  (1.753 m)  Wt 196 lb (88.905 kg)  BMI 28.94 kg/m2  Review of Systems See HPI above.    Objective:   Physical Exam Gen: NAD  R hand: No gross deformity, swelling, or bruising. Small palpable nodule at A1 pulley of 5th digit - painful.  No other TTP about hand including at PIP, DIP, MCP joints of digits.  Pain and mild catching with full flexion of digit. Collateral ligaments intact and full flexion/extension with 5/5 strength of 5th digit. NVI distally.  L hand: No gross deformity, swelling, or bruising. Small palpable nodule at A1 pulley of 2nd digit - painful.  No other TTP about hand including at PIP, DIP, MCP joints of digits.  Pain and mild catching with full flexion of digit. Collateral ligaments intact and full flexion/extension with 5/5 strength of 2nd digit. NVI distally.    Assessment & Plan:  1. Left index and right 5th finger trigger fingers - Patient declined injections today at level of  A1 pulley.  Will start oral nsaid and try immobilization - shown how to use adhesive bandages to minimize motion at PIP joints to keep in extension without completely affecting motion.  F/u in 4-6 weeks for reevaluation.  Consider injections if not improving.  Handout provided.

## 2012-01-21 ENCOUNTER — Other Ambulatory Visit: Payer: Self-pay | Admitting: Internal Medicine

## 2012-01-21 MED ORDER — EZETIMIBE 10 MG PO TABS: 10.0000 mg | ORAL_TABLET | Freq: Every day | ORAL | Status: DC

## 2012-01-21 NOTE — Telephone Encounter (Signed)
refill zetia 10mg  tab take 1 tablet by mouth daily, cycle medication last fill 10.16.13--per fax -- last ov 6.12.13-76-month follow up

## 2012-01-21 NOTE — Telephone Encounter (Signed)
Refill done.  

## 2012-02-10 ENCOUNTER — Encounter: Payer: Self-pay | Admitting: Internal Medicine

## 2012-02-14 ENCOUNTER — Other Ambulatory Visit: Payer: Self-pay | Admitting: Internal Medicine

## 2012-02-16 NOTE — Telephone Encounter (Signed)
Refill done.  

## 2012-03-18 ENCOUNTER — Encounter: Payer: Medicare PPO | Admitting: Internal Medicine

## 2012-03-25 ENCOUNTER — Other Ambulatory Visit: Payer: Self-pay | Admitting: Internal Medicine

## 2012-03-25 NOTE — Telephone Encounter (Signed)
Refill done.  

## 2012-04-06 ENCOUNTER — Encounter: Payer: Self-pay | Admitting: Internal Medicine

## 2012-04-06 ENCOUNTER — Ambulatory Visit (INDEPENDENT_AMBULATORY_CARE_PROVIDER_SITE_OTHER): Payer: Medicare PPO | Admitting: Internal Medicine

## 2012-04-06 VITALS — BP 136/74 | HR 70 | Temp 97.5°F | Ht 68.5 in | Wt 195.0 lb

## 2012-04-06 DIAGNOSIS — E785 Hyperlipidemia, unspecified: Secondary | ICD-10-CM

## 2012-04-06 DIAGNOSIS — M109 Gout, unspecified: Secondary | ICD-10-CM

## 2012-04-06 DIAGNOSIS — N400 Enlarged prostate without lower urinary tract symptoms: Secondary | ICD-10-CM

## 2012-04-06 DIAGNOSIS — Z23 Encounter for immunization: Secondary | ICD-10-CM

## 2012-04-06 DIAGNOSIS — G4730 Sleep apnea, unspecified: Secondary | ICD-10-CM

## 2012-04-06 DIAGNOSIS — I10 Essential (primary) hypertension: Secondary | ICD-10-CM

## 2012-04-06 DIAGNOSIS — Z0000 Encounter for general adult medical examination without abnormal findings: Secondary | ICD-10-CM

## 2012-04-06 LAB — LIPID PANEL
Cholesterol: 139 mg/dL (ref 0–200)
HDL: 44.1 mg/dL (ref >39.00)
LDL Cholesterol: 73 mg/dL (ref 0–99)
Total CHOL/HDL Ratio: 3
Triglycerides: 111 mg/dL (ref 0.0–149.0)
VLDL: 22.2 mg/dL (ref 0.0–40.0)

## 2012-04-06 LAB — BASIC METABOLIC PANEL
BUN: 15 mg/dL (ref 6–23)
CO2: 27 mEq/L (ref 19–32)
Calcium: 9.3 mg/dL (ref 8.4–10.5)
Chloride: 102 mEq/L (ref 96–112)
Creatinine, Ser: 1.2 mg/dL (ref 0.4–1.5)
GFR: 75.08 mL/min (ref >60.00)
Glucose, Bld: 94 mg/dL (ref 70–99)
Potassium: 4 mEq/L (ref 3.5–5.1)
Sodium: 138 mEq/L (ref 135–145)

## 2012-04-06 LAB — AST: AST: 24 U/L (ref 0–37)

## 2012-04-06 LAB — ALT: ALT: 23 U/L (ref 0–53)

## 2012-04-06 MED ORDER — COLCHICINE 0.6 MG PO TABS: 0.6000 mg | ORAL_TABLET | Freq: Two times a day (BID) | ORAL | Status: DC | PRN

## 2012-04-06 MED ORDER — HYDROCODONE-ACETAMINOPHEN 5-500 MG PO TABS: 1.0000 | ORAL_TABLET | Freq: Four times a day (QID) | ORAL | Status: DC | PRN

## 2012-04-06 NOTE — Assessment & Plan Note (Signed)
Not using his CPAP, denies fatigue, he sleeps 4 hours at night and takes a daily nap.

## 2012-04-06 NOTE — Assessment & Plan Note (Addendum)
Td 01 and 2011 pneumonia shot 2001  and 2010 shingles shot, discussed , got a Rx last year but did not use. flu shot today   had a Cscope w/ [facility] [address] before, again Cscope 4-11 (had a + hemocult), bx ----> tubular adenoma, next Cscope per GI Exercise, encouraged increased physical activity He eats a regular diet, states that at his age he just like to enjoy, that is hard to argue however I will recommend that if his diabetes and cholesterol are not well controlled, he will have to rethink his diet

## 2012-04-06 NOTE — Assessment & Plan Note (Signed)
zetia is becoming expensive, hopefuly once he is out of the donut hole it will be ok, see instructions

## 2012-04-06 NOTE — Progress Notes (Signed)
  Subjective:    Patient ID: Bradley Johns, male    DOB: September 30, 1934, 77 y.o.   MRN: [id number]  HPI Here for Medicare AWV: 1.         Risk factors based on Past M, S, F history:reviewed   2.         Physical Activities: active, plans to go to the gym. 3.         Depression/mood: (-) screening  4.         Hearing: no complaints, no problems noted   5.         ADL's: totally independent   6.         Fall Risk: low risk, no h/o falls , counseled   7.         Home Safety: does feel safe at home   8.         Height, weight, &visual acuity: see VS, vision corrected , sees eye doctor routinely   9.         Counseling: yes , see below   10.       Labs ordered based on risk factors: yes   11.           Referral Coordination: if needed   12.           Care Plan: see a/p   13.            Cognitive Assessment , motor skills, cognition and memory seem appropriate  In addition, we discussed the following issues High cholesterol, good medication compliance, Zetia is becoming very expensive. Hypertension, good medication compliance, ambulatory BPs usually in the 130s. Doxazocin is becoming very expensive. History of a trigger finger, saw sports med, they recommend observation. Sleep apnea, on no CPAP, see assessment and plan. Diabetes, good medication compliance, followup by endocrinology  Past Medical History:  COPD  Diabetes mellitus, type II  Gout  Hypertension  Hyperlipidemia  SLEEP APNEA  Osteoarthritis  Benign prostatic hypertrophy   Past Surgical History:  toe surgery   Family History:  colon ca--no  prostate ca--no  Diabetes-- sister  Liver disease--M  CAD-- F onset in his 76s   Social History:  Married , retired , 2 children  diet-- regular  former smoker, 1 ppd, quit 2000 aprox  ETOH-- rarely    Review of Systems No chest pain or shortness of breath Denies nausea, vomiting, diarrhea. No blood in the stools. Denies difficulty urinating or blood in the urine.      Objective:   Physical Exam General -- alert, well-developed, + central obesity.   Neck --no thyromegaly , normal carotid pulse  Lungs -- normal respiratory effort, no intercostal retractions, no accessory muscle use, and normal breath sounds.   Heart-- normal rate, regular rhythm, no murmur, and no gallop.   Abdomen--soft, non-tender, no distention, no masses, no HSM, no guarding, and no rigidity. No apparent   Extremities-- no pretibial edema bilaterally Rectal-- No external abnormalities noted. Normal sphincter tone. No rectal masses or tenderness. Brown stool  Prostate:  Symmetrically enlarged, not tender or nodular. Neurologic-- alert & oriented X3 and strength normal in all extremities. Psych-- Cognition and judgment appear intact. Alert and cooperative with normal attention span and concentration.  not anxious appearing and not depressed appearing.      Assessment & Plan:

## 2012-04-06 NOTE — Assessment & Plan Note (Addendum)
BP well-controlled at around 130. Doxazosin is getting expensive however he will be out of the donut hole soon. Recommend to call me if he is unable to afford it .

## 2012-04-06 NOTE — Assessment & Plan Note (Signed)
Takes occasional hydrocodone and colchicine. Needs refills

## 2012-04-06 NOTE — Patient Instructions (Addendum)
If doxazosin  continue to be very expensive let me know. If Zetia continue to be very expensive we could increased Lipitor dose from 40 to 80 or  try a medication called Liptruzet  Fall Prevention and Home Safety Falls cause injuries and can affect all age groups. It is possible to use preventive measures to significantly decrease the likelihood of falls. There are many simple measures which can make your home safer and prevent falls. OUTDOORS  Repair cracks and edges of walkways and driveways.  Remove high doorway thresholds.  Trim shrubbery on the main path into your home.  Have good outside lighting.  Clear walkways of tools, rocks, debris, and clutter.  Check that handrails are not broken and are securely fastened. Both sides of steps should have handrails.  Have leaves, snow, and ice cleared regularly.  Use sand or salt on walkways during winter months.  In the garage, clean up grease or oil spills. BATHROOM  Install night lights.  Install grab bars by the toilet and in the tub and shower.  Use non-skid mats or decals in the tub or shower.  Place a plastic non-slip stool in the shower to sit on, if needed.  Keep floors dry and clean up all water on the floor immediately.  Remove soap buildup in the tub or shower on a regular basis.  Secure bath mats with non-slip, double-sided rug tape.  Remove throw rugs and tripping hazards from the floors. BEDROOMS  Install night lights.  Make sure a bedside light is easy to reach.  Do not use oversized bedding.  Keep a telephone by your bedside.  Have a firm chair with side arms to use for getting dressed.  Remove throw rugs and tripping hazards from the floor. KITCHEN  Keep handles on pots and pans turned toward the center of the stove. Use back burners when possible.  Clean up spills quickly and allow time for drying.  Avoid walking on wet floors.  Avoid hot utensils and knives.  Position shelves so they are  not too high or low.  Place commonly used objects within easy reach.  If necessary, use a sturdy step stool with a grab bar when reaching.  Keep electrical cables out of the way.  Do not use floor polish or wax that makes floors slippery. If you must use wax, use non-skid floor wax.  Remove throw rugs and tripping hazards from the floor. STAIRWAYS  Never leave objects on stairs.  Place handrails on both sides of stairways and use them. Fix any loose handrails. Make sure handrails on both sides of the stairways are as long as the stairs.  Check carpeting to make sure it is firmly attached along stairs. Make repairs to worn or loose carpet promptly.  Avoid placing throw rugs at the top or bottom of stairways, or properly secure the rug with carpet tape to prevent slippage. Get rid of throw rugs, if possible.  Have an electrician put in a light switch at the top and bottom of the stairs. OTHER FALL PREVENTION TIPS  Wear low-heel or rubber-soled shoes that are supportive and fit well. Wear closed toe shoes.  When using a stepladder, make sure it is fully opened and both spreaders are firmly locked. Do not climb a closed stepladder.  Add color or contrast paint or tape to grab bars and handrails in your home. Place contrasting color strips on first and last steps.  Learn and use mobility aids as needed. Install an Lobbyist emergency  response system.  Turn on lights to avoid dark areas. Replace light bulbs that burn out immediately. Get light switches that glow.  Arrange furniture to create clear pathways. Keep furniture in the same place.  Firmly attach carpet with non-skid or double-sided tape.  Eliminate uneven floor surfaces.  Select a carpet pattern that does not visually hide the edge of steps.  Be aware of all pets. OTHER HOME SAFETY TIPS  Set the water temperature for 120 F (48.8 C).  Keep emergency numbers on or near the telephone.  Keep smoke detectors on  every level of the home and near sleeping areas. Document Released: 03/14/2002 Document Revised: 09/23/2011 Document Reviewed: 06/13/2011 [facility] Patient Information 2013 [place], [place].

## 2012-04-06 NOTE — Assessment & Plan Note (Addendum)
Essentially asymptomatic, DRE at baseline. Recent PSAs is normal. Plan: Continue doxazosin for now.

## 2012-04-07 DIAGNOSIS — J8410 Pulmonary fibrosis, unspecified: Secondary | ICD-10-CM

## 2012-04-07 HISTORY — DX: Pulmonary fibrosis, unspecified: J84.10

## 2012-04-09 ENCOUNTER — Encounter: Payer: Self-pay | Admitting: *Deleted

## 2012-04-27 ENCOUNTER — Telehealth: Payer: Self-pay | Admitting: *Deleted

## 2012-04-27 ENCOUNTER — Other Ambulatory Visit: Payer: Self-pay | Admitting: Internal Medicine

## 2012-04-27 NOTE — Telephone Encounter (Signed)
Refill done.  

## 2012-04-27 NOTE — Telephone Encounter (Signed)
Advise patient: 1. I review endocrinology note from few months ago, diabetes is not well-controlled. Needs to stay with endocrinology, diabetes-related medications need to be RF by them. 2. It zetia is  expensive, we could discontinue Zetia and change to Lipitor from 40 mg to 80 mg daily. If patient willing to do that, please send a new prescription for Lipitor 80 mg. 3. Unfortunately, there is no  inexpensive alternatives for Cardura or colcrys;  I am surprise cardura is expensive b/c is a generic

## 2012-04-27 NOTE — Telephone Encounter (Signed)
Pt called requesting that his refills be sent to [facility].  Pt also would like Dr. Drue Novel to take over managing his diabetic meds instead of Dr. Allena Katz. Dr. Allena Katz has been refilling metformin, atorvastatin, actos, & his humalog mix.  Pt stated as well that the generics for zetia, cardura, & colcrys are all to expensive & would like something else. I advised the pt the best way to do this is to provide [place] with a "preferred med list" from his insurance company. The pt stated that he had the list but there were not any alternatives listed for these 3 meds. Please advise.

## 2012-04-28 MED ORDER — ATORVASTATIN CALCIUM 80 MG PO TABS: 80.0000 mg | ORAL_TABLET | Freq: Every day | ORAL | Status: DC

## 2012-04-28 NOTE — Telephone Encounter (Signed)
Discussed with pt, sent lipitor 80mg  to [facility].

## 2012-05-04 ENCOUNTER — Telehealth: Payer: Self-pay | Admitting: *Deleted

## 2012-05-04 MED ORDER — AMLODIPINE BESYLATE 10 MG PO TABS: 10.0000 mg | ORAL_TABLET | Freq: Every day | ORAL | Status: DC

## 2012-05-04 MED ORDER — LOSARTAN POTASSIUM 50 MG PO TABS: 50.0000 mg | ORAL_TABLET | Freq: Every day | ORAL | Status: DC

## 2012-05-04 MED ORDER — CARVEDILOL 12.5 MG PO TABS: 12.5000 mg | ORAL_TABLET | Freq: Two times a day (BID) | ORAL | Status: DC

## 2012-05-04 MED ORDER — DOXAZOSIN MESYLATE 4 MG PO TABS: 4.0000 mg | ORAL_TABLET | Freq: Every day | ORAL | Status: DC

## 2012-05-04 NOTE — Telephone Encounter (Signed)
Refill done.  

## 2012-06-08 ENCOUNTER — Telehealth: Payer: Self-pay | Admitting: Internal Medicine

## 2012-06-08 NOTE — Telephone Encounter (Signed)
Discussed with pt

## 2012-06-08 NOTE — Telephone Encounter (Signed)
Patient states he has been taking omega 3 fish oil, but was wondering if he should switch to [facility]. He would like someone to go over with him the difference between the two supplements. Call home # first, if no answer call [phone]

## 2012-06-08 NOTE — Telephone Encounter (Signed)
Please advise 

## 2012-06-08 NOTE — Telephone Encounter (Signed)
Mega red is marketed as krill oil, my advise is to take fish oil, a good brand, take the same brand overtime nad when we do labs will see if his cholesterol is satisfactory.

## 2012-08-12 ENCOUNTER — Encounter: Payer: Self-pay | Admitting: Internal Medicine

## 2012-10-04 ENCOUNTER — Ambulatory Visit: Payer: Medicare PPO | Admitting: Internal Medicine

## 2012-10-07 ENCOUNTER — Encounter: Payer: Self-pay | Admitting: Lab

## 2012-10-11 ENCOUNTER — Encounter: Payer: Self-pay | Admitting: Internal Medicine

## 2012-10-11 ENCOUNTER — Ambulatory Visit (INDEPENDENT_AMBULATORY_CARE_PROVIDER_SITE_OTHER): Payer: Medicare PPO | Admitting: Internal Medicine

## 2012-10-11 VITALS — BP 148/65 | HR 66 | Temp 97.8°F | Wt 202.6 lb

## 2012-10-11 DIAGNOSIS — J4489 Other specified chronic obstructive pulmonary disease: Secondary | ICD-10-CM

## 2012-10-11 DIAGNOSIS — E785 Hyperlipidemia, unspecified: Secondary | ICD-10-CM

## 2012-10-11 DIAGNOSIS — R0989 Other specified symptoms and signs involving the circulatory and respiratory systems: Secondary | ICD-10-CM

## 2012-10-11 DIAGNOSIS — R0609 Other forms of dyspnea: Secondary | ICD-10-CM

## 2012-10-11 DIAGNOSIS — N400 Enlarged prostate without lower urinary tract symptoms: Secondary | ICD-10-CM

## 2012-10-11 DIAGNOSIS — I10 Essential (primary) hypertension: Secondary | ICD-10-CM

## 2012-10-11 DIAGNOSIS — J449 Chronic obstructive pulmonary disease, unspecified: Secondary | ICD-10-CM

## 2012-10-11 DIAGNOSIS — R0600 Dyspnea, unspecified: Secondary | ICD-10-CM

## 2012-10-11 MED ORDER — TAMSULOSIN HCL 0.4 MG PO CAPS: 0.4000 mg | ORAL_CAPSULE | Freq: Every day | ORAL | Status: DC

## 2012-10-11 NOTE — Assessment & Plan Note (Signed)
See dyspnea on exertion.

## 2012-10-11 NOTE — Assessment & Plan Note (Signed)
Previously asymptomatic, now with nocturia. DRE today w/o  worrisome features, PSA 2012 normal. Plan: Trial with Flomax.

## 2012-10-11 NOTE — Assessment & Plan Note (Signed)
Unable to afford Zetia, was recommended to take Lipitor 80 mg but still taking 40. Plan: Increase Lipitor to 80 mg daily.

## 2012-10-11 NOTE — Assessment & Plan Note (Signed)
Well controlled 

## 2012-10-11 NOTE — Progress Notes (Signed)
  Subjective:    Patient ID: Bradley Johns, male    DOB: [birth year], 78 y.o.   MRN: [id number]  HPI Routine visit, since the last time he was here he has developed several issues:  Dyspnea on exertion: He gets short of breath after any strenuous activities, symptoms started 3-4 months ago, unable to tell me if the symptoms are getting worse or not. He is able to go up a flight of stairs but definitely gets short of breath if he is carrying any packages with him. Denies chest pain, lower extremity edema or orthopnea. Cough slightly increased from baseline but denies sputum production. No wheezing.  Has noted his weight to increase, by our scales his weight went from 195 pounds to 210 pounds  Complaining of numbness at the tip of his fingers for a while, denies neck pain.  Complains of nocturia, approximately 4 times a night. Denies dysuria, gross hematuria or difficulty urinating. No fever, chills, nausea or flank pain.  High cholesterol, unable to afford zetia, was recommended to increased Lipitor from 40 mg to 80 mg but still taking 40 mg by mouth  Hypertension, good medication compliance, ambulatory BPs  121/59 on average.   Past Medical History:   COPD   Diabetes mellitus, type II   Gout   Hypertension   Hyperlipidemia   SLEEP APNEA   Osteoarthritis   Benign prostatic hypertrophy   Past Surgical History:   toe surgery    Family History:   colon ca--no   prostate ca--no   Diabetes-- sister   Liver disease--M   CAD-- F onset in his 20s   Social History:   Married , retired , 2 children   diet-- regular   former smoker, 1 ppd, quit 2000 aprox   ETOH-- rarely    Review of Systems See HPI    Objective:   Physical Exam BP 148/65  Pulse 66  Temp(Src) 97.8 F (36.6 C) (Oral)  Wt 202 lb 9.6 oz (91.899 kg)  BMI 30.35 kg/m2  SpO2 88%  General -- alert, well-developed, NAD, O2 sat 88  Neck --no JVD at 45  Lungs -- normal respiratory effort, no intercostal  retractions, no accessory muscle use, and normal breath sounds.   Heart-- normal rate, regular rhythm, no murmur, and no gallop.   Abdomen--soft, non-tender, no distention, Normal shifting dullness that I can tell   Extremities-- no pretibial edema bilaterally Rectal-- No external abnormalities noted. Normal sphincter tone. No rectal masses or tenderness. Brown stool Prostate:  Symmetrically enlarged Neurologic-- alert & oriented X3 and strength normal in all extremities. Psych-- Cognition and judgment appear intact. Alert and cooperative with normal attention span and concentration.  not anxious appearing and not depressed appearing.         Assessment & Plan:   Complaining of numbness at the fingertips, no neck pain. Assess sx on  return to the office in one month. Weight gain 7 pounds in the last 6 months, will reassess on return to the office. He is on Actos, a  side effects from the medication is a consideration

## 2012-10-11 NOTE — Patient Instructions (Addendum)
Please get your x-ray at the other [facility]  office located at: [address] [place], across from [facility].  Please go to the basement, this is a walk-in facility, they are open from 8:30 to 5:30 PM. Phone number [phone]. --- New medication ----> Flomax one at bedtime to help the urinary problem. Take atorvastatin 40 mg 2 tablets daily Please come back in one month.

## 2012-10-11 NOTE — Assessment & Plan Note (Addendum)
78 year old gentleman, diabetic with COPD presents with 3- 4 months h/o DOE, see HPI. He does have some cough, no vol overload on clinical grounds. DDX is large but includes silent ischemia, CHF, progressive COPD, others. EKG today -- [facility] Plan: Chest x-ray, PFTs, pulse oximetry, cards referral, likely will need any echocardiogram and stress test. Labs Consider trial w/  spiriva

## 2012-10-12 ENCOUNTER — Telehealth: Payer: Self-pay | Admitting: *Deleted

## 2012-10-12 ENCOUNTER — Ambulatory Visit (INDEPENDENT_AMBULATORY_CARE_PROVIDER_SITE_OTHER)
Admission: RE | Admit: 2012-10-12 | Discharge: 2012-10-12 | Disposition: A | Discharge status: Home / Self Care | Payer: Medicare PPO | Source: Ambulatory Visit | Attending: Internal Medicine | Admitting: Internal Medicine

## 2012-10-12 DIAGNOSIS — I10 Essential (primary) hypertension: Secondary | ICD-10-CM

## 2012-10-12 DIAGNOSIS — J449 Chronic obstructive pulmonary disease, unspecified: Secondary | ICD-10-CM

## 2012-10-12 DIAGNOSIS — R0600 Dyspnea, unspecified: Secondary | ICD-10-CM

## 2012-10-12 LAB — CBC WITH DIFFERENTIAL/PLATELET
Basophils Absolute: 0 10*3/uL (ref 0.0–0.1)
Basophils Relative: 0.3 % (ref 0.0–3.0)
Eosinophils Absolute: 0.2 10*3/uL (ref 0.0–0.7)
Eosinophils Relative: 2.9 % (ref 0.0–5.0)
HCT: 41.2 % (ref 39.0–52.0)
Hemoglobin: 13.6 g/dL (ref 13.0–17.0)
Lymphocytes Relative: 30.9 % (ref 12.0–46.0)
Lymphs Abs: 1.9 10*3/uL (ref 0.7–4.0)
MCHC: 33.1 g/dL (ref 30.0–36.0)
MCV: 93.9 fl (ref 78.0–100.0)
Monocytes Absolute: 0.7 10*3/uL (ref 0.1–1.0)
Monocytes Relative: 11.5 % (ref 3.0–12.0)
Neutro Abs: 3.4 10*3/uL (ref 1.4–7.7)
Neutrophils Relative %: 54.4 % (ref 43.0–77.0)
Platelets: 188 10*3/uL (ref 150.0–400.0)
RBC: 4.39 Mil/uL (ref 4.22–5.81)
RDW: 14.8 % — ABNORMAL HIGH (ref 11.5–14.6)
WBC: 6.2 10*3/uL (ref 4.5–10.5)

## 2012-10-12 LAB — AST: AST: 23 U/L (ref 0–37)

## 2012-10-12 LAB — BRAIN NATRIURETIC PEPTIDE: Pro B Natriuretic peptide (BNP): 21 pg/mL (ref 0.0–100.0)

## 2012-10-12 LAB — URINALYSIS, ROUTINE W REFLEX MICROSCOPIC
Bilirubin Urine: NEGATIVE
Hgb urine dipstick: NEGATIVE
Ketones, ur: NEGATIVE
Leukocytes, UA: NEGATIVE
Nitrite: NEGATIVE
RBC / HPF: NONE SEEN (ref 0–?)
Specific Gravity, Urine: 1.015 (ref 1.000–1.030)
Total Protein, Urine: 30
Urine Glucose: NEGATIVE
Urobilinogen, UA: 1 (ref 0.0–1.0)
pH: 6 (ref 5.0–8.0)

## 2012-10-12 LAB — BASIC METABOLIC PANEL
BUN: 20 mg/dL (ref 6–23)
CO2: 34 mEq/L — ABNORMAL HIGH (ref 19–32)
Calcium: 9.8 mg/dL (ref 8.4–10.5)
Chloride: 103 mEq/L (ref 96–112)
Creatinine, Ser: 1.4 mg/dL (ref 0.4–1.5)
GFR: 64.97 mL/min (ref >60.00)
Glucose, Bld: 93 mg/dL (ref 70–99)
Potassium: 4.5 mEq/L (ref 3.5–5.1)
Sodium: 141 mEq/L (ref 135–145)

## 2012-10-12 LAB — TSH: TSH: 4.13 u[IU]/mL (ref 0.35–5.50)

## 2012-10-12 LAB — ALT: ALT: 18 U/L (ref 0–53)

## 2012-10-12 MED ORDER — TAMSULOSIN HCL 0.4 MG PO CAPS: 0.4000 mg | ORAL_CAPSULE | Freq: Every day | ORAL | Status: DC

## 2012-10-12 NOTE — Telephone Encounter (Signed)
Re-sent rx to the correct pharmacy.

## 2012-10-12 NOTE — Telephone Encounter (Signed)
Message copied by Nada Maclachlan on Tue Oct 12, 2012  2:59 PM ------      Message from: Willow Ora E      Created: Mon Oct 11, 2012  5:50 PM       Needs 24-hour  Pulse-oximeter, DX DOE and  COPD please arrange ------

## 2012-10-12 NOTE — Telephone Encounter (Signed)
Order entered

## 2012-10-13 ENCOUNTER — Telehealth: Payer: Self-pay | Admitting: Internal Medicine

## 2012-10-13 MED ORDER — DOXYCYCLINE HYCLATE 100 MG PO TABS: 100.0000 mg | ORAL_TABLET | Freq: Two times a day (BID) | ORAL | Status: DC

## 2012-10-13 NOTE — Telephone Encounter (Signed)
Noted cardiology referral placed 10/11/12. Clarified with patient. He states it is okay to leave detailed message on his home voice mail.

## 2012-10-13 NOTE — Telephone Encounter (Signed)
Advise patient: Labs okay, nothing to explain his symptoms. Chest x-ray showed Increased scarring of the lungs (COPD) , the radiologist also mention possibly pneumonia but that is less likely. Recommend  --doxycycline 100 mg twice a day for one week, please call prescription. --After cardiology sees the patient, will consider further eval of his lungs (CT of the chest )

## 2012-10-13 NOTE — Telephone Encounter (Signed)
Discussed with patient, verbalized understanding. RX called in per orders. Pt. States he does not currently have a cardiologist. Do we need to place orders for a referral?

## 2012-10-19 ENCOUNTER — Telehealth: Payer: Self-pay | Admitting: Internal Medicine

## 2012-10-19 NOTE — Telephone Encounter (Signed)
PATIENT WOULD LIKE COPY OF  VISIT AND LABS MAILED TO HIM

## 2012-10-21 NOTE — Telephone Encounter (Signed)
Mailed to pt

## 2012-11-01 ENCOUNTER — Telehealth: Payer: Self-pay | Admitting: *Deleted

## 2012-11-01 ENCOUNTER — Other Ambulatory Visit: Payer: Self-pay | Admitting: *Deleted

## 2012-11-01 MED ORDER — AMLODIPINE BESYLATE 10 MG PO TABS: 10.0000 mg | ORAL_TABLET | Freq: Every day | ORAL | Status: DC

## 2012-11-01 MED ORDER — ATORVASTATIN CALCIUM 40 MG PO TABS: 80.0000 mg | ORAL_TABLET | Freq: Every day | ORAL | Status: DC

## 2012-11-01 NOTE — Telephone Encounter (Signed)
Okay to refill doxazosin x 6 months

## 2012-11-01 NOTE — Telephone Encounter (Signed)
Rx was filled for atorvastatin 80 mg 90 ct 1 refill.

## 2012-11-01 NOTE — Telephone Encounter (Signed)
Rx refilled 11/01/12 for amlodipine 10 mg for 90 day supply with 1 refill.

## 2012-11-01 NOTE — Telephone Encounter (Signed)
Pharmacy's requesting refill for Doxazosin 4 mg however patient las PSA was 03/18/11. Last ov 10/11/12 and last date filled was 05/04/12 90 ct with 1 refill please advise.

## 2012-11-02 ENCOUNTER — Telehealth: Payer: Self-pay | Admitting: *Deleted

## 2012-11-02 ENCOUNTER — Other Ambulatory Visit: Payer: Self-pay | Admitting: *Deleted

## 2012-11-02 MED ORDER — CARVEDILOL 12.5 MG PO TABS: 12.5000 mg | ORAL_TABLET | Freq: Two times a day (BID) | ORAL | Status: DC

## 2012-11-02 MED ORDER — DOXAZOSIN MESYLATE 4 MG PO TABS: 4.0000 mg | ORAL_TABLET | Freq: Every day | ORAL | Status: DC

## 2012-11-02 NOTE — Telephone Encounter (Signed)
Rx was filled for Carvedilol 12.5  11/02/12

## 2012-11-02 NOTE — Telephone Encounter (Signed)
Rx refilled for 90 day supply with 1 Refill

## 2012-11-02 NOTE — Telephone Encounter (Signed)
Ok RF losartan  for 6 months

## 2012-11-02 NOTE — Telephone Encounter (Signed)
Pharmacy's requesting a refill for losartan 50 mg last ov 10/11/12 last date filled 05/04/12 with 1 Refill.  Normally I would of filled this medication however within the last prescription it states that authorization is needed before next refill.  Please Advise.

## 2012-11-03 ENCOUNTER — Other Ambulatory Visit: Payer: Self-pay | Admitting: *Deleted

## 2012-11-03 MED ORDER — DOXAZOSIN MESYLATE 4 MG PO TABS: 4.0000 mg | ORAL_TABLET | Freq: Every day | ORAL | Status: DC

## 2012-11-03 MED ORDER — ATORVASTATIN CALCIUM 40 MG PO TABS: 80.0000 mg | ORAL_TABLET | Freq: Every day | ORAL | Status: DC

## 2012-11-03 MED ORDER — LOSARTAN POTASSIUM 50 MG PO TABS: 50.0000 mg | ORAL_TABLET | Freq: Every day | ORAL | Status: DC

## 2012-11-03 MED ORDER — CARVEDILOL 12.5 MG PO TABS: 12.5000 mg | ORAL_TABLET | Freq: Two times a day (BID) | ORAL | Status: DC

## 2012-11-03 MED ORDER — AMLODIPINE BESYLATE 10 MG PO TABS: 10.0000 mg | ORAL_TABLET | Freq: Every day | ORAL | Status: DC

## 2012-11-03 NOTE — Telephone Encounter (Signed)
Rx has been filled for Losartan 50 mg 11/03/12.

## 2012-11-08 NOTE — Telephone Encounter (Signed)
Patient left vm on triage line stating [facility] has not received rx for atorvastatin.

## 2012-11-08 NOTE — Telephone Encounter (Signed)
Spoke with pt. Pharmacy. Insurance would not cover two 40mg  tablets daily (total 80mg ) however, would cover one 80mg  tablet daily (same dose). Verbal orders from Dr. Drue Novel ok to change to one 80mg  tablet daily. Pt. Agreed to this transition as well.

## 2012-11-10 ENCOUNTER — Telehealth: Payer: Self-pay | Admitting: *Deleted

## 2012-11-10 NOTE — Telephone Encounter (Signed)
Request was sent and approved 11/09/12 for Doxazosin. AG CMA

## 2012-11-12 ENCOUNTER — Ambulatory Visit (INDEPENDENT_AMBULATORY_CARE_PROVIDER_SITE_OTHER): Payer: Medicare PPO | Admitting: Internal Medicine

## 2012-11-12 VITALS — BP 140/70 | HR 73 | Temp 97.6°F | Wt 203.8 lb

## 2012-11-12 DIAGNOSIS — J449 Chronic obstructive pulmonary disease, unspecified: Secondary | ICD-10-CM

## 2012-11-12 DIAGNOSIS — R0989 Other specified symptoms and signs involving the circulatory and respiratory systems: Secondary | ICD-10-CM

## 2012-11-12 DIAGNOSIS — G5620 Lesion of ulnar nerve, unspecified upper limb: Secondary | ICD-10-CM

## 2012-11-12 DIAGNOSIS — R0609 Other forms of dyspnea: Secondary | ICD-10-CM

## 2012-11-12 DIAGNOSIS — R0600 Dyspnea, unspecified: Secondary | ICD-10-CM

## 2012-11-12 DIAGNOSIS — I10 Essential (primary) hypertension: Secondary | ICD-10-CM

## 2012-11-12 DIAGNOSIS — N400 Enlarged prostate without lower urinary tract symptoms: Secondary | ICD-10-CM

## 2012-11-12 DIAGNOSIS — J4489 Other specified chronic obstructive pulmonary disease: Secondary | ICD-10-CM

## 2012-11-12 LAB — BASIC METABOLIC PANEL
BUN: 15 mg/dL (ref 6–23)
CO2: 28 mEq/L (ref 19–32)
Calcium: 9.5 mg/dL (ref 8.4–10.5)
Chloride: 103 mEq/L (ref 96–112)
Creatinine, Ser: 1.2 mg/dL (ref 0.4–1.5)
GFR: 72.87 mL/min (ref >60.00)
Glucose, Bld: 107 mg/dL — ABNORMAL HIGH (ref 70–99)
Potassium: 4.1 mEq/L (ref 3.5–5.1)
Sodium: 138 mEq/L (ref 135–145)

## 2012-11-12 NOTE — Assessment & Plan Note (Addendum)
Last chest x-ray report: Increased lung markings in the bases compared to the prior study.  This most likely is progression of interstitial lung disease.  Superimposed left lower lobe pneumonia cannot be excluded.   status post antibiotics  Plan-- repeat CXR on RTC

## 2012-11-12 NOTE — Assessment & Plan Note (Signed)
PFTs and cards eval  pending.  Patient stable, better?

## 2012-11-12 NOTE — Progress Notes (Signed)
  Subjective:    Patient ID: Bradley Johns, male    DOB: Oct 15, 1934, 78 y.o.   MRN: [id number]  HPI Followup from previous visit DOE--stable,  Slightly better?Marland Kitchen BPH--started Flomax, nocturia has decreased to one time at night. Better. Complains of numbness in the left hand, tip of 3-4-5 fingers, worse w/ pressures in the elbow. He types a lot and he constantly has the elbows on the armrest   Past Medical History:   COPD   Diabetes mellitus, type II   Gout   Hypertension   Hyperlipidemia   SLEEP APNEA   Osteoarthritis   Benign prostatic hypertrophy   Past Surgical History:   toe surgery    Family History:   colon ca--no   prostate ca--no   Diabetes-- sister   Liver disease--M   CAD-- F onset in his 109s   Social History:   Married , retired , 2 children   diet-- regular   former smoker, 1 ppd, quit 2000 aprox   ETOH-- rarely    Review of Systems No chest pain, no shortness or breath at rest No lower extremity edema No neck pain or actual elbow pain    Objective:   Physical Exam BP 140/70  Pulse 73  Temp(Src) 97.6 F (36.4 C) (Oral)  Wt 203 lb 12.8 oz (92.443 kg)  BMI 30.53 kg/m2  SpO2 88%  General -- alert, well-developed, NAD    Lungs -- normal respiratory effort, no intercostal retractions, no accessory muscle use, and normal breath sounds.   Heart-- normal rate, regular rhythm, no murmur, and no gallop.   Extremities-- Elbows symmetric and not tender to palpation, hands and wrists normal , Grip normal Psych-- Cognition and judgment appear intact. Alert and cooperative with normal attention span and concentration.  not anxious appearing and not depressed appearing.       Assessment & Plan:

## 2012-11-12 NOTE — Assessment & Plan Note (Signed)
Last creatinine slightly elevated, labs No change on rx

## 2012-11-12 NOTE — Patient Instructions (Addendum)
Come back in 3 months    Cubital Tunnel Syndrome (Ulnar Neuritis) Cubital tunnel syndrome is a disorder of the nervous system of the elbow and upper arm the causes pain, tingling, weakness of the hand, or the loss of feeling in the ring and little fingers. The disorder is caused by a compression or stretching of the ulnar nerve at the elbow and in the forearm by muscles or ligament-like tissues. The ulnar nerve is susceptible to injury because it has little tissue that protects it at the elbow. The lack of protection increases the risk of injury. Cubital tunnel syndrome may decrease athletic performance in sports that require strong hand or wrist actions (tennis or racquetball).  SYMPTOMS   Clumsiness or weakness of the hand.  Poor dexterity (fine hand function).  Tenderness of the inner elbow.  Aching or soreness of the inner elbow.  Increased pain with forced full-elbow bending.  Reduced control with throwing, such as pitching.  Tingling, numbness, or burning inside the forearm or in part of the hand or fingers (especially the little finger or ring finger).  Sharp pains that shoot from the elbow down to the wrist and hand.  A weak grip, especially power grip, and a weak pinch.  Reduced performance in sports that require a strong grip. CAUSES   Increased pressure on the ulnar nerve at the elbow, arm, or forearm caused by swollen, inflamed, or scarred tissues; ligament-like; or between muscles.  Stretching of the nerve due to loose elbow ligaments.  Trauma to the nerve at the elbow.  Repetitive elbow bending. RISK INCREASES WITH:  Poor strength or flexibility.  Inadequate warm-up properly physical activity.  Diabetes mellitus.  under-active thyroid gland(hypothyroidism).  Repetitive and/or strenuous throwing motions such as baseball and javelin throwing.  Contact sports (football, soccer, rugby, or lacrosse).  Other elbow conditions (medial epicondylitis or loose  inner elbow ligaments). PREVENTION  Warm up and stretch properly before activity.  Maintain physical fitness:  Wrist, forearm, and elbow flexibility.  Muscle strength and endurance.  Cardiovascular fitness.  Wear proper protective equipment, including elbow pads.  Learn and use proper throwing techniques. PROGNOSIS  Cubital tunnel syndrome is typically curable is treated appropriately. Is some cases the condition may heal without treatment. If the muscle begins to waste or the nerve damage worsens, surgery may be necessary. RELATED COMPLICATIONS   Permanent numbness and weakness of the ring and little fingers.  Weak grip.  Permanent paralysis of some hand and finger muscles.  Risks associated with surgery, including infection, bleeding, injury to nerves (including the ulnar nerve), recurrent or continued symptoms, and elbow stiffness. TREATMENT  Treatment initially involves stopping the activities that cause the symptoms to worsen. Medications and ice can be used to reduce pain in inflammation. Splinting and protecting the elbow with padding (especially at night) to prevent full bending of the elbow may help. Stretching and strengthening exercises of the muscles of the forearm and elbow are important, and they may be performed at home or with the assistance of a therapist. If conservative treatment is not successful, surgery may be necessary to reduce compression of the nerve. Before return to sport, assessment for proper throwing and hitting mechanics is important.  MEDICATION   If pain medication is necessary, nonsteroidal anti-inflammatory medications, such as aspirin and ibuprofen, or other minor pain relievers, such as acetaminophen, are often recommended. Contact your caregiver immediately if any bleeding, stomach upset, or signs of an allergic reaction occur.  Prescription pain relievers are usually only  prescribed after surgery. Use only as directed and only as much as you  need. COLD THERAPY   Cold treatment (icing) relieves pain and reduces inflammation. Cold treatment should be applied for 10 to 15 minutes every 2 to 3 hours for inflammation and pain and immediately after any activity that aggravates your symptoms. Use ice packs or an ice massage. SEEK MEDICAL CARE IF:   Symptoms get worse or do not improve in 2 weeks despite treatment.  You experience pain, numbness, or coldness in the hand.  Blue, gray, or dark color appears in the fingernails.  Any of the following occur after surgery: increased pain, swelling, redness, drainage, or bleeding in the surgical area or signs of infection.  New, unexplained symptoms develop (drugs used in treatment may produce side effects). Document Released: 03/24/2005 Document Revised: 06/16/2011 Document Reviewed: 07/06/2008 [facility] Patient Information 2014 [place], [place].

## 2012-11-12 NOTE — Assessment & Plan Note (Addendum)
Sx decreased on flomax

## 2012-11-14 ENCOUNTER — Encounter: Payer: Self-pay | Admitting: Internal Medicine

## 2012-11-14 DIAGNOSIS — G5622 Lesion of ulnar nerve, left upper limb: Secondary | ICD-10-CM | POA: Insufficient documentation

## 2012-11-14 NOTE — Assessment & Plan Note (Signed)
New problem Left hand paresthesias likely, cubital syndrome, see instructions (I did recommend him to avoid Motrin or similar medications); Main treatment is to avoid putting pressure on the elbows. Advised to call me if symptoms persisting, he could develop permanent numbness or nerve damage. Patient aware.

## 2012-11-15 ENCOUNTER — Encounter: Payer: Self-pay | Admitting: *Deleted

## 2012-11-23 ENCOUNTER — Ambulatory Visit (INDEPENDENT_AMBULATORY_CARE_PROVIDER_SITE_OTHER): Payer: Medicare PPO | Admitting: Pulmonary Disease

## 2012-11-23 DIAGNOSIS — J449 Chronic obstructive pulmonary disease, unspecified: Secondary | ICD-10-CM

## 2012-11-23 DIAGNOSIS — R0989 Other specified symptoms and signs involving the circulatory and respiratory systems: Secondary | ICD-10-CM

## 2012-11-23 DIAGNOSIS — R0609 Other forms of dyspnea: Secondary | ICD-10-CM

## 2012-11-23 DIAGNOSIS — R0600 Dyspnea, unspecified: Secondary | ICD-10-CM

## 2012-11-23 LAB — PULMONARY FUNCTION TEST

## 2012-11-23 NOTE — Progress Notes (Signed)
PFT done today. 

## 2012-12-07 ENCOUNTER — Telehealth: Payer: Self-pay | Admitting: Internal Medicine

## 2012-12-07 NOTE — Telephone Encounter (Signed)
Patient called stating he had PFT done 11/23/12 and has not received any results. Patient would like someone to call us with results. CB# [phone]

## 2012-12-07 NOTE — Telephone Encounter (Signed)
Pt notified that he needs to contact Dr. Teddy Spike office to obtain results of PFT.

## 2012-12-10 ENCOUNTER — Telehealth: Payer: Self-pay

## 2012-12-10 ENCOUNTER — Telehealth: Payer: Self-pay | Admitting: Internal Medicine

## 2012-12-10 DIAGNOSIS — J849 Interstitial pulmonary disease, unspecified: Secondary | ICD-10-CM

## 2012-12-10 NOTE — Telephone Encounter (Signed)
Patient needs PFT results. He has been waiting since 8/19 for these. Our office told him to call Dr. Teddy Spike office and Pulmonary told him to call our office because Dr. Drue Novel is the ordering physician. Patient is very frustrated and does not understand why no one will give him results. Please advise.

## 2012-12-10 NOTE — Telephone Encounter (Signed)
Per melanie can close

## 2012-12-10 NOTE — Telephone Encounter (Signed)
I called the patient, PFTs showed essentially no obstruction or significant response to bronchodilators and a severely decreased DLCO, findings are consistent with interstitial lung disease. This was discussed with the patient, plans to refer him to pulmonary for consideration of further eval

## 2012-12-13 ENCOUNTER — Ambulatory Visit (INDEPENDENT_AMBULATORY_CARE_PROVIDER_SITE_OTHER): Payer: Medicare PPO | Admitting: Cardiovascular Disease

## 2012-12-13 ENCOUNTER — Encounter: Payer: Self-pay | Admitting: Cardiovascular Disease

## 2012-12-13 VITALS — BP 162/65 | HR 68 | Wt 202.0 lb

## 2012-12-13 DIAGNOSIS — E785 Hyperlipidemia, unspecified: Secondary | ICD-10-CM

## 2012-12-13 DIAGNOSIS — I10 Essential (primary) hypertension: Secondary | ICD-10-CM

## 2012-12-13 DIAGNOSIS — R0989 Other specified symptoms and signs involving the circulatory and respiratory systems: Secondary | ICD-10-CM

## 2012-12-13 DIAGNOSIS — R0600 Dyspnea, unspecified: Secondary | ICD-10-CM

## 2012-12-13 DIAGNOSIS — R0609 Other forms of dyspnea: Secondary | ICD-10-CM

## 2012-12-13 LAB — D-DIMER, QUANTITATIVE: D-Dimer, Quant: <0.27 ug/mL-FEU (ref 0.00–0.48)

## 2012-12-13 NOTE — Assessment & Plan Note (Signed)
More likely ILD given abnormality on exam, CXR and PFT;s.  Will order echo to assess LV and RV dysfunction.  Will also check BMET, BNP and d dimer.  Given his long standing IDDM should r/o anginal equivalent  ECG normal so will order routine ETT Has f/u with pulmonary in [place] tomorrow

## 2012-12-13 NOTE — Assessment & Plan Note (Signed)
Cholesterol is at goal.  Continue current dose of statin and diet Rx.  No myalgias or side effects.  F/U  LFT's in 6 months. Lab Results  Component Value Date   LDLCALC 73 04/06/2012

## 2012-12-13 NOTE — Patient Instructions (Signed)
Your physician recommends that you schedule a follow-up appointment in: PENDING TEST RESULTS  Your physician recommends that you continue on your current medications as directed. Please refer to the Current Medication list given to you today. Your physician has requested that you have an echocardiogram. Echocardiography is a painless test that uses sound waves to create images of your heart. It provides your doctor with information about the size and shape of your heart and how well your heart's chambers and valves are working. This procedure takes approximately one hour. There are no restrictions for this procedure. Your physician has requested that you have en exercise stress myoview. For further information please visit [email or web address] Please follow instruction sheet, as given.  Your physician recommends that you return for lab work in: TODAY  BMET BNP   D DIMER

## 2012-12-13 NOTE — Assessment & Plan Note (Signed)
Well controlled.  Continue current medications and low sodium Dash type diet.    

## 2012-12-13 NOTE — Progress Notes (Signed)
Patient ID: Bradley Johns, male   DOB: 01/29/1935, 78 y.o.   MRN: [id number] 78 yo referred for dyspnea.  Former smoker  Has begun pulmonary w/u and is seeing Dr Vassie Loll tomorrow  CXR consistant with ILD and marked decrease in DLCO.  Long standing IDDM with no recent ischemic w/u.  Dyspnea worse over last 6 months  Has gained 20 lbs last year or two.  No chest pain.  Denies PND orthopnea No history of valve disease or CHF. No history of DVT or PE  Worked for [employer] with no toxic exposures. Denies cough fever or sputum    CXR 10/11/12 IMPRESSION: COPD.  Increased lung markings in the bases compared to the prior study. This most likely is progression of interstitial lung disease. Superimposed left lower lobe pneumonia cannot be excluded.   ROS: Denies fever, malais, weight loss, blurry vision, decreased visual acuity, cough, sputum, SOB, hemoptysis, pleuritic pain, palpitaitons, heartburn, abdominal pain, melena, lower extremity edema, claudication, or rash.  All other systems reviewed and negative   General: Affect appropriate Obese black male  HEENT: normal Neck supple with no adenopathy JVP normal no bruits no thyromegaly Lungs Basilar crackles LLL more than right  no wheezing and good diaphragmatic motion Heart:  S1/S2 no murmur,rub, gallop or click PMI normal Abdomen: benighn, BS positve, no tenderness, no AAA no bruit.  No HSM or HJR Distal pulses intact with no bruits No edema Neuro non-focal Skin warm and dry No muscular weakness  Medications Current Outpatient Prescriptions  Medication Sig Dispense Refill  . amLODipine (NORVASC) 10 MG tablet Take 1 tablet (10 mg total) by mouth daily.  90 tablet  1  . aspirin 81 MG tablet Take 81 mg by mouth daily.        Marland Kitchen atorvastatin (LIPITOR) 40 MG tablet Take 2 tablets (80 mg total) by mouth daily.  90 tablet  1  . B-D ULTRAFINE III SHORT PEN 31G X 8 MM MISC       . carvedilol (COREG) 12.5 MG tablet Take 1 tablet (12.5 mg total) by  mouth 2 (two) times daily with a meal.  180 tablet  1  . colchicine 0.6 MG tablet Take 1 tablet (0.6 mg total) by mouth 2 (two) times daily as needed.  60 tablet  3  . doxazosin (CARDURA) 4 MG tablet Take 1 tablet (4 mg total) by mouth at bedtime.  90 tablet  1  . HYDROcodone-acetaminophen (VICODIN) 5-500 MG per tablet Take 1 tablet by mouth every 6 (six) hours as needed.  40 tablet  1  . Insulin Lispro Prot & Lispro (HUMALOG MIX 75/25 PEN [place]) Inject 40 Units into the skin daily.       Marland Kitchen losartan (COZAAR) 50 MG tablet Take 1 tablet (50 mg total) by mouth daily.  90 tablet  1  . metFORMIN (GLUCOPHAGE) 500 MG tablet Take 500 mg by mouth 2 (two) times daily with a meal.        . Multiple Vitamin (MULTIVITAMIN) tablet Take 1 tablet by mouth daily.        . pioglitazone (ACTOS) 45 MG tablet Take 45 mg by mouth daily.      . tamsulosin (FLOMAX) 0.4 MG CAPS Take 1 capsule (0.4 mg total) by mouth daily.  30 capsule  0  . [DISCONTINUED] simvastatin (ZOCOR) 80 MG tablet Take 0.5 tablets (40 mg total) by mouth at bedtime.  90 tablet  3   No current facility-administered medications for this visit.  Allergies Review of patient's allergies indicates no known allergies.  Family History: Family History  Problem Relation Age of Onset  . Liver disease Mother   . Coronary artery disease Father   . Diabetes Sister   . Sudden death Neg Hx   . Hypertension Neg Hx   . Hyperlipidemia Neg Hx   . Heart attack Neg Hx     Social History: History   Social History  . Marital Status: Married    Spouse Name: N/A    Number of Children: N/A  . Years of Education: N/A   Occupational History  . Not on file.   Social History Main Topics  . Smoking status: Former Games developer  . Smokeless tobacco: Not on file  . Alcohol Use: Yes     Comment: occassional beer  . Drug Use: Not on file  . Sexual Activity: Not on file   Other Topics Concern  . Not on file   Social History Narrative  . No narrative on file     Electrocardiogram:  10/11/12  SR rate 63  Normal   Assessment and Plan

## 2012-12-14 ENCOUNTER — Ambulatory Visit (INDEPENDENT_AMBULATORY_CARE_PROVIDER_SITE_OTHER): Payer: Medicare PPO | Admitting: Pulmonary Disease

## 2012-12-14 ENCOUNTER — Other Ambulatory Visit: Payer: Self-pay | Admitting: *Deleted

## 2012-12-14 ENCOUNTER — Encounter: Payer: Self-pay | Admitting: Pulmonary Disease

## 2012-12-14 VITALS — BP 130/62 | HR 62 | Temp 98.2°F | Ht 68.5 in | Wt 202.0 lb

## 2012-12-14 DIAGNOSIS — J8410 Pulmonary fibrosis, unspecified: Secondary | ICD-10-CM

## 2012-12-14 DIAGNOSIS — G4730 Sleep apnea, unspecified: Secondary | ICD-10-CM

## 2012-12-14 DIAGNOSIS — E875 Hyperkalemia: Secondary | ICD-10-CM

## 2012-12-14 DIAGNOSIS — J449 Chronic obstructive pulmonary disease, unspecified: Secondary | ICD-10-CM

## 2012-12-14 LAB — BASIC METABOLIC PANEL
BUN: 18 mg/dL (ref 6–23)
CO2: 33 mEq/L — ABNORMAL HIGH (ref 19–32)
Calcium: 9.5 mg/dL (ref 8.4–10.5)
Chloride: 104 mEq/L (ref 96–112)
Creatinine, Ser: 1.3 mg/dL (ref 0.4–1.5)
GFR: 67.79 mL/min (ref >60.00)
Glucose, Bld: 93 mg/dL (ref 70–99)
Potassium: 5.4 mEq/L — ABNORMAL HIGH (ref 3.5–5.1)
Sodium: 140 mEq/L (ref 135–145)

## 2012-12-14 LAB — BRAIN NATRIURETIC PEPTIDE: Pro B Natriuretic peptide (BNP): 30 pg/mL (ref 0.0–100.0)

## 2012-12-14 LAB — RHEUMATOID FACTOR: Rhuematoid fact SerPl-aCnc: <10 IU/mL (ref <=14)

## 2012-12-14 MED ORDER — ALBUTEROL SULFATE HFA 108 (90 BASE) MCG/ACT IN AERS: 2.0000 | INHALATION_SPRAY | Freq: Four times a day (QID) | RESPIRATORY_TRACT | Status: DC | PRN

## 2012-12-14 NOTE — Assessment & Plan Note (Addendum)
Bibasal interstitial infiltrates is very suggestive of pulmonary fibrosis - in this age group in the absence of stigmata for collagen-vascular disease, etiopathic pulmonary fibrosis is most likely, I would doubt hypersensitivity pneumonitis Blood work today including ESR, ANA, RA factor, CCP High-resolution CT scan for your lungs Oxygen assessment You may benefit from a pulmonary rehab program

## 2012-12-14 NOTE — Progress Notes (Signed)
Subjective:    Patient ID: Bradley Johns, male    DOB: 1935/02/03, 78 y.o.   MRN: [id number]  HPI  PCP- Drue Novel  78 year old remote smoker referred for evaluation of dyspnea and interstitial lung disease. He reports dyspnea and exertion, gradually progressive over the last 6 months. He reports a chronic dry cough with no clear triggers without diurnal variation. This no history of postnasal drip, heartburn or childhood history of asthma. His oxygen saturation was 90% on walking into the office, he dropped to 81% on walking 1 lap and recovered with oxygen. He required 3 L of oxygen to maintain saturation of 90% on walking. Chest x-ray from 10/11/12 when compared to 2006 showed hyperinflation and bibasal interstitial markings that were progressive from prior. PFTs showed no airway obstruction with FEV1 of 67% and FVC of 65% post bronchodilator with 12 percent bronchodilator response. There was severe intraparenchymal restriction with TLC of 60% and DLCO of 32%. He smoked about a pack per day until he quit in 1998. He is a retired Merchandiser, retail for [employer]. He was born in [place] lived in [place] for 20 years and moved to [place] in [place].  Is no history of significant environmental exposure. He was seen by cardiology and a stress test is planned He was diagnosed with obstructive sleep apnea but is not compliant with CPAP  Past Medical History  Diagnosis Date  . COPD (chronic obstructive pulmonary disease)   . Diabetes mellitus     2  . Gout   . Hypertension   . Hyperlipidemia   . Sleep apnea   . Arthritis   . Benign prostatic hypertrophy     Past Surgical History  Procedure Laterality Date  . Toe surgery      No Known Allergies  History   Social History  . Marital Status: Married    Spouse Name: N/A    Number of Children: 2  . Years of Education: N/A   Occupational History  . retired    Social History Main Topics  . Smoking status: Former Smoker -- 1.00 packs/day for 30  years    Types: Cigarettes    Quit date: 04/07/1996  . Smokeless tobacco: Not on file  . Alcohol Use: Yes     Comment: occassional beer  . Drug Use: No  . Sexual Activity: Not on file   Other Topics Concern  . Not on file   Social History Narrative  . No narrative on file    Family History  Problem Relation Age of Onset  . Liver disease Mother   . Coronary artery disease Father   . Diabetes Sister   . Cancer Brother   . Hypertension Neg Hx   . Hyperlipidemia Neg Hx   . Heart attack Neg Hx        Review of Systems  Constitutional: Positive for unexpected weight change. Negative for fever.  HENT: Positive for dental problem. Negative for ear pain, nosebleeds, congestion, sore throat, rhinorrhea, sneezing, trouble swallowing, postnasal drip and sinus pressure.   Eyes: Negative for redness and itching.  Respiratory: Positive for shortness of breath. Negative for cough, chest tightness and wheezing.   Cardiovascular: Negative for palpitations and leg swelling.  Gastrointestinal: Negative for nausea and vomiting.  Genitourinary: Negative for dysuria.  Musculoskeletal: Negative for joint swelling.  Skin: Negative for rash.  Neurological: Negative for headaches.  Hematological: Does not bruise/bleed easily.  Psychiatric/Behavioral: Negative for dysphoric mood. The patient is not nervous/anxious.  Objective:   Physical Exam  Gen. Pleasant, well-nourished, in no distress, normal affect ENT - no lesions, no post nasal drip Neck: No JVD, no thyromegaly, no carotid bruits Lungs: no use of accessory muscles, no dullness to percussion, bibasal fine rales, no rhonchi  Cardiovascular: Rhythm regular, heart sounds  normal, no murmurs or gallops, no peripheral edema Abdomen: soft and non-tender, no hepatosplenomegaly, BS normal. Musculoskeletal: No deformities, no cyanosis or clubbing Neuro:  alert, non focal       Assessment & Plan:

## 2012-12-14 NOTE — Patient Instructions (Addendum)
You may have pulmonary fibrosis Blood work today CT scan for your lungs Oxygen assessment You may benefit from a pulmonary rehab program Start oxygen 3l/min while walking & 2l/min during sleep

## 2012-12-15 ENCOUNTER — Telehealth: Payer: Self-pay | Admitting: Pulmonary Disease

## 2012-12-15 LAB — SEDIMENTATION RATE: Sed Rate: 6 mm/hr (ref 0–16)

## 2012-12-15 LAB — ANA: Anti Nuclear Antibody(ANA): NEGATIVE

## 2012-12-15 LAB — CYCLIC CITRUL PEPTIDE ANTIBODY, IGG: Cyclic Citrullin Peptide Ab: <2 U/mL (ref 0.0–5.0)

## 2012-12-15 NOTE — Telephone Encounter (Signed)
No other alternative to O2 Pl tell pt that I think he should  Absolutely be on oxygen

## 2012-12-15 NOTE — Telephone Encounter (Signed)
lmomtcb x1 for pt on home # Cell phone does not have VM WCB

## 2012-12-15 NOTE — Assessment & Plan Note (Signed)
He is not compliant with his CPAP I've asked him to use 2 L of oxygen during sleep for now Will need further evaluation in the future

## 2012-12-15 NOTE — Assessment & Plan Note (Signed)
Doubt significant airway obstruction on PFTs, but will trial of Spiriva one workup ongoing for interstitial lung disease

## 2012-12-15 NOTE — Telephone Encounter (Signed)
Spoke with Okey Regal from [facility] She states that she called the pt to set up o2 and pt told her that RA had discussed "other alternatives" besides o2  He states that he wants to talk with RA before starts o2  Just had ov 12/14/12 Dr Vassie Loll please advise thanks

## 2012-12-16 NOTE — Telephone Encounter (Signed)
Spoke with patient, informed him of recs per Dr. Vassie Loll Patient states that is unsure about starting o2 because he does not want to be dependent on "tanks" Patient states he will wait for his appt on 9/23 w Dr. Vassie Loll to discuss this more before starting o2 Will forward to Dr. Vassie Loll so that he is aware.

## 2012-12-16 NOTE — Telephone Encounter (Signed)
lmomtcb x1 for pt 

## 2012-12-16 NOTE — Telephone Encounter (Signed)
Would recommend strongly that he start now & not wait There is no danger of  'dependence' - if he needs this, he must use it.

## 2012-12-17 ENCOUNTER — Other Ambulatory Visit: Payer: Self-pay | Admitting: Cardiovascular Disease

## 2012-12-17 ENCOUNTER — Other Ambulatory Visit: Payer: Medicare PPO

## 2012-12-17 ENCOUNTER — Other Ambulatory Visit: Payer: Self-pay | Admitting: *Deleted

## 2012-12-17 LAB — BASIC METABOLIC PANEL
BUN: 17 mg/dL (ref 6–23)
CO2: 30 mEq/L (ref 19–32)
Calcium: 9.4 mg/dL (ref 8.4–10.5)
Chloride: 103 mEq/L (ref 96–112)
Creat: 1.14 mg/dL (ref 0.50–1.35)
Glucose, Bld: 133 mg/dL — ABNORMAL HIGH (ref 70–99)
Potassium: 4.5 mEq/L (ref 3.5–5.3)
Sodium: 140 mEq/L (ref 135–145)

## 2012-12-17 NOTE — Telephone Encounter (Signed)
Spoke with the pt and notified of recs per RA He verbalized understanding and states nothing further needed

## 2012-12-21 ENCOUNTER — Ambulatory Visit (HOSPITAL_BASED_OUTPATIENT_CLINIC_OR_DEPARTMENT_OTHER)
Admission: RE | Admit: 2012-12-21 | Discharge: 2012-12-21 | Disposition: A | Discharge status: Home / Self Care | Payer: Medicare PPO | Source: Ambulatory Visit | Attending: Pulmonary Disease | Admitting: Pulmonary Disease

## 2012-12-21 DIAGNOSIS — J4489 Other specified chronic obstructive pulmonary disease: Secondary | ICD-10-CM | POA: Insufficient documentation

## 2012-12-21 DIAGNOSIS — R0602 Shortness of breath: Secondary | ICD-10-CM | POA: Insufficient documentation

## 2012-12-21 DIAGNOSIS — J449 Chronic obstructive pulmonary disease, unspecified: Secondary | ICD-10-CM | POA: Insufficient documentation

## 2012-12-21 DIAGNOSIS — J438 Other emphysema: Secondary | ICD-10-CM | POA: Insufficient documentation

## 2012-12-21 DIAGNOSIS — E119 Type 2 diabetes mellitus without complications: Secondary | ICD-10-CM | POA: Insufficient documentation

## 2012-12-21 DIAGNOSIS — J8410 Pulmonary fibrosis, unspecified: Secondary | ICD-10-CM

## 2012-12-21 LAB — HYPERSENSITIVITY PNUEMONITIS PROFILE

## 2012-12-23 ENCOUNTER — Telehealth: Payer: Self-pay | Admitting: Pulmonary Disease

## 2012-12-23 NOTE — Telephone Encounter (Signed)
   Result Note    Severe emphysema, also confirms fibrosis at the bottom of both lungs   I spoke with patient about results and he verbalized understanding and had no questions

## 2012-12-28 ENCOUNTER — Other Ambulatory Visit: Payer: Self-pay | Admitting: *Deleted

## 2012-12-28 ENCOUNTER — Encounter: Payer: Self-pay | Admitting: Pulmonary Disease

## 2012-12-28 ENCOUNTER — Ambulatory Visit (INDEPENDENT_AMBULATORY_CARE_PROVIDER_SITE_OTHER): Payer: Medicare PPO | Admitting: Pulmonary Disease

## 2012-12-28 VITALS — BP 132/76 | HR 78 | Temp 97.8°F | Ht 68.5 in | Wt 205.0 lb

## 2012-12-28 DIAGNOSIS — J8410 Pulmonary fibrosis, unspecified: Secondary | ICD-10-CM

## 2012-12-28 DIAGNOSIS — J449 Chronic obstructive pulmonary disease, unspecified: Secondary | ICD-10-CM

## 2012-12-28 DIAGNOSIS — R0600 Dyspnea, unspecified: Secondary | ICD-10-CM

## 2012-12-28 MED ORDER — TIOTROPIUM BROMIDE MONOHYDRATE 18 MCG IN CAPS: 18.0000 ug | ORAL_CAPSULE | Freq: Every day | RESPIRATORY_TRACT | Status: DC

## 2012-12-28 NOTE — Patient Instructions (Addendum)
You have COPD & pulmonary fibbrosis Humidifier Pulmonary rehab program Ambulatory satn on oxygen Trial of Spiriva -call us if this works Use albuterol MDI 2 puffs every 6h as needed  Tell me if you want to enroll in trial for PERFENIDONE -new drug for fibrosis

## 2012-12-28 NOTE — Progress Notes (Signed)
  Subjective:    Patient ID: Bradley Johns, male    DOB: 1934-12-06, 78 y.o.   MRN: [id number]  HPI PCP- Drue Novel   78 year old remote smoker referred for evaluation of dyspnea and interstitial lung disease.  He reports dyspnea and exertion, gradually progressive over the last 6 months. He reports a chronic dry cough with no clear triggers without diurnal variation. This no history of postnasal drip, heartburn or childhood history of asthma. His oxygen saturation was 90% on walking into the office, he dropped to 81% on walking 1 lap and recovered with oxygen. He required 3 L of oxygen to maintain saturation of 90% on walking.  Chest x-ray from 10/11/12 when compared to 2006 showed hyperinflation and bibasal interstitial markings that were progressive from prior.  PFTs showed no airway obstruction with FEV1 of 67% and FVC of 65% post bronchodilator with 12 percent bronchodilator response. There was severe intraparenchymal restriction with TLC of 60% and DLCO of 32%.  He smoked about a pack per day until he quit in 1998. He is a retired Merchandiser, retail for [employer]. He was born in [place] lived in [place] for 20 years and moved to [place] in [place].  Is no history of significant environmental exposure.  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  ESR 6, ANA, RA factor, CCP neg  Pt reports his breathing is slightly better. He uses the oxygen rest or sleep. Does not use it with exertion. He has dry cough. At times when he blows his nose he will have a speck of blood in it. He also feels like his throat feels dry.  Albuterol MDI helps some Desatn on 2L continuous on walking- needs 3L cont  Past Medical History  Diagnosis Date  . COPD (chronic obstructive pulmonary disease)   .  Diabetes mellitus     2  . Gout   . Hypertension   . Hyperlipidemia   . Sleep apnea   . Arthritis   . Benign prostatic hypertrophy      Review of Systems neg for any significant sore throat, dysphagia, itching, sneezing, nasal congestion or excess/ purulent secretions, fever, chills, sweats, unintended wt loss, pleuritic or exertional cp, hempoptysis, orthopnea pnd or change in chronic leg swelling. Also denies presyncope, palpitations, heartburn, abdominal pain, nausea, vomiting, diarrhea or change in bowel or urinary habits, dysuria,hematuria, rash, arthralgias, visual complaints, headache, numbness weakness or ataxia.      Objective:   Physical Exam   Gen. Pleasant, obese, in no distress ENT - no lesions, no post nasal drip Neck: No JVD, no thyromegaly, no carotid bruits Lungs: no use of accessory muscles, no dullness to percussion, bibasal rales, no rhonchi  Cardiovascular: Rhythm regular, heart sounds  normal, no murmurs or gallops, no peripheral edema Musculoskeletal: No deformities, no cyanosis or clubbing , no tremors        Assessment & Plan:

## 2012-12-29 NOTE — Assessment & Plan Note (Signed)
He has  COPD & pulmonary fibbrosis - hence DLCO extremely low out of proportion to degree of airway obstruction Humidifier for O2, requires portable 3L cont - can evaluate for pulse Pulmonary rehab program Tell me if you want to enroll in trial for PERFENIDONE -new drug for fibrosis

## 2012-12-29 NOTE — Assessment & Plan Note (Signed)
Trial of Spiriva -call us if this works Use albuterol MDI 2 puffs every 6h as needed

## 2012-12-30 ENCOUNTER — Telehealth: Payer: Self-pay | Admitting: Pulmonary Disease

## 2012-12-30 ENCOUNTER — Ambulatory Visit (HOSPITAL_BASED_OUTPATIENT_CLINIC_OR_DEPARTMENT_OTHER): Payer: Medicare PPO | Admitting: Radiology

## 2012-12-30 ENCOUNTER — Ambulatory Visit (HOSPITAL_COMMUNITY): Payer: Medicare PPO | Attending: Cardiovascular Disease | Admitting: Radiology

## 2012-12-30 VITALS — BP 144/75 | Ht 68.5 in | Wt 199.0 lb

## 2012-12-30 DIAGNOSIS — R0602 Shortness of breath: Secondary | ICD-10-CM

## 2012-12-30 DIAGNOSIS — R0600 Dyspnea, unspecified: Secondary | ICD-10-CM

## 2012-12-30 MED ORDER — TECHNETIUM TC 99M SESTAMIBI GENERIC - CARDIOLITE
10.0000 | Freq: Once | INTRAVENOUS | Status: AC | PRN
  Administered 2012-12-30: 10 via INTRAVENOUS

## 2012-12-30 MED ORDER — REGADENOSON 0.4 MG/5ML IV SOLN
0.4000 mg | Freq: Once | INTRAVENOUS | Status: AC
Start: 2012-12-30 — End: 2012-12-30
  Administered 2012-12-30: 0.4 mg via INTRAVENOUS

## 2012-12-30 MED ORDER — TECHNETIUM TC 99M SESTAMIBI GENERIC - CARDIOLITE
30.0000 | Freq: Once | INTRAVENOUS | Status: AC | PRN
  Administered 2012-12-30: 30 via INTRAVENOUS

## 2012-12-30 NOTE — Progress Notes (Signed)
[facility] SITE 3 NUCLEAR MED [address] [place], [place] [address] [phone]    Cardiology Nuclear Med Study  Bradley Johns is a 78 y.o. male     MRN : [id number]     DOB: 1934-07-21  Procedure Date: 12/30/2012  Nuclear Med Background Indication for Stress Test:  Evaluation for Ischemia History:  COPD, Emphysema and '01 MPS: EF: 48% ABN report Heart Cath: Nl No EF Cardiac Risk Factors: Family History - CAD, History of Smoking, Hypertension and Lipids  Symptoms:  SOB   Nuclear Pre-Procedure Caffeine/Decaff Intake:  None > 12 hrs NPO After: 6:30pm   Lungs:  clear O2 Sat: 95% on 2 LPM via [place]. IV 0.9% NS with Angio Cath:  20g  IV Site: R Antecubital x 1, tolerated well IV Started by:  Irean Hong, RN  Chest Size (in):  42 Cup Size: n/a  Height: 5' 8.5" (1.74 m)  Weight:  199 lb (90.266 kg)  BMI:  Body mass index is 29.81 kg/(m^2). Tech Comments:  No medication today; held Coreg x 24 hrs    Nuclear Med Study 1 or 2 day study: 1 day  Stress Test Type:  Eugenie Birks  Reading MD: Kristeen Miss, MD  Order Authorizing Provider:  Charlton Haws, MD  Resting Radionuclide: Technetium 36m Sestamibi  Resting Radionuclide Dose: 11.0 mCi   Stress Radionuclide:  Technetium 33m Sestamibi  Stress Radionuclide Dose: 33.0 mCi           Stress Protocol Rest HR: 64 Stress HR: 89  Rest BP: 144/75 Stress BP: 150/71  Exercise Time (min): n/a METS: n/a   Predicted Max HR: 144 bpm % Max HR: 61.81 bpm Rate Pressure Product: 21308   Dose of Adenosine (mg):  n/a Dose of Lexiscan: 0.4 mg  Dose of Atropine (mg): n/a Dose of Dobutamine: n/a mcg/kg/min (at max HR)  Stress Test Technologist: Milana Na, EMT-P  Nuclear Technologist:  Domenic Polite, CNMT     Rest Procedure:  Myocardial perfusion imaging was performed at rest 45 minutes following the intravenous administration of Technetium 66m Sestamibi. Rest ECG: NSR - Normal EKG  Stress Procedure:  The patient received IV  Lexiscan 0.4 mg over 15-seconds.  Technetium 41m Sestamibi injected at 30-seconds. This patient was sob, and was lt. Headed with the Lexiscan injection. Quantitative spect images were obtained after a 45 minute delay. Stress ECG: No significant change from baseline ECG  QPS Raw Data Images:  Normal; no motion artifact; normal heart/lung ratio. Stress Images:  Normal homogeneous uptake in all areas of the myocardium. Rest Images:  Normal homogeneous uptake in all areas of the myocardium. Subtraction (SDS):  No evidence of ischemia. Transient Ischemic Dilatation (Normal <1.22):  n/a Lung/Heart Ratio (Normal <0.45):  0.38  Quantitative Gated Spect Images QGS EDV:  105 ml QGS ESV:  48 ml  Impression Exercise Capacity:  Lexiscan with no exercise. BP Response:  Normal blood pressure response. Clinical Symptoms:  No significant symptoms noted. ECG Impression:  No significant ST segment change suggestive of ischemia. Comparison with Prior Nuclear Study: No images to compare  Overall Impression:  Normal stress nuclear study.  LV Ejection Fraction: 55%.  LV Wall Motion:  NL LV Function; NL Wall Motion.   Vesta Mixer, [name]., MD, [facility] 12/30/2012, 7:20 PM Office - [phone] Pager [phone]

## 2012-12-30 NOTE — Telephone Encounter (Signed)
PL let him know that I would really recommend him to start on perfenidone -that is the only medication that helps fibrosis

## 2012-12-30 NOTE — Progress Notes (Signed)
Echocardiogram performed.  

## 2012-12-31 NOTE — Telephone Encounter (Signed)
I spoke with pt. He stated he still has not decided what he wants to do yet and he is still discussing this with his wife. He stated once he knows he will call us. Will forward to RA as an [place]

## 2013-01-25 ENCOUNTER — Ambulatory Visit: Payer: Medicare PPO | Admitting: Pulmonary Disease

## 2013-02-08 ENCOUNTER — Ambulatory Visit: Payer: Medicare PPO | Admitting: Pulmonary Disease

## 2013-02-14 ENCOUNTER — Encounter: Payer: Self-pay | Admitting: Internal Medicine

## 2013-02-14 ENCOUNTER — Ambulatory Visit (INDEPENDENT_AMBULATORY_CARE_PROVIDER_SITE_OTHER): Payer: Medicare PPO | Admitting: Internal Medicine

## 2013-02-14 VITALS — BP 134/79 | HR 71 | Temp 98.3°F | Wt 205.0 lb

## 2013-02-14 DIAGNOSIS — I10 Essential (primary) hypertension: Secondary | ICD-10-CM

## 2013-02-14 DIAGNOSIS — Z23 Encounter for immunization: Secondary | ICD-10-CM

## 2013-02-14 DIAGNOSIS — R0600 Dyspnea, unspecified: Secondary | ICD-10-CM

## 2013-02-14 DIAGNOSIS — M1990 Unspecified osteoarthritis, unspecified site: Secondary | ICD-10-CM

## 2013-02-14 DIAGNOSIS — J8410 Pulmonary fibrosis, unspecified: Secondary | ICD-10-CM

## 2013-02-14 MED ORDER — CARVEDILOL 12.5 MG PO TABS: 12.5000 mg | ORAL_TABLET | Freq: Two times a day (BID) | ORAL | Status: DC

## 2013-02-14 MED ORDER — TAMSULOSIN HCL 0.4 MG PO CAPS: 0.4000 mg | ORAL_CAPSULE | Freq: Every day | ORAL | Status: DC

## 2013-02-14 MED ORDER — LOSARTAN POTASSIUM 50 MG PO TABS: 50.0000 mg | ORAL_TABLET | Freq: Every day | ORAL | Status: DC

## 2013-02-14 MED ORDER — COLCHICINE 0.6 MG PO TABS: 0.6000 mg | ORAL_TABLET | Freq: Two times a day (BID) | ORAL | Status: DC | PRN

## 2013-02-14 MED ORDER — AMLODIPINE BESYLATE 10 MG PO TABS: 10.0000 mg | ORAL_TABLET | Freq: Every day | ORAL | Status: DC

## 2013-02-14 MED ORDER — DOXAZOSIN MESYLATE 4 MG PO TABS: 4.0000 mg | ORAL_TABLET | Freq: Every day | ORAL | Status: DC

## 2013-02-14 MED ORDER — ATORVASTATIN CALCIUM 40 MG PO TABS: 80.0000 mg | ORAL_TABLET | Freq: Every day | ORAL | Status: DC

## 2013-02-14 NOTE — Assessment & Plan Note (Addendum)
Since the last time he was here, PFTs and CT chest showed pulmonary fibrosis. 12-2012 had a normal echocardiogram and a negative nuclear stress test. Now on oxygen and doing better.

## 2013-02-14 NOTE — Assessment & Plan Note (Signed)
Recently diagnosed with pulmonary fibrosis based on a chest x-ray, CT chest. Follow up by pulmonary, on oxygen, on average uses 15 hours a da y of  oxygen therapy. Feels better.  Flu shot  today.

## 2013-02-14 NOTE — Patient Instructions (Signed)
  Next visit in 3-4 months for a physical exam  .Fasting Please make an appointment

## 2013-02-14 NOTE — Progress Notes (Signed)
Pre visit review using our clinic review tool, if applicable. No additional management support is needed unless otherwise documented below in the visit note. 

## 2013-02-14 NOTE — Assessment & Plan Note (Addendum)
Well-controlled, last BMP okay, no change, followup In 3-4 months

## 2013-02-14 NOTE — Progress Notes (Signed)
  Subjective:    Patient ID: Bradley Johns, male    DOB: 26-Jul-1934, 78 y.o.   MRN: [id number]  HPI Followup from previous visit Dyspnea on exertion--chart reviewed, see assessment and plan Hypertension ----good compliance with medications, Recent BMP okay  Past Medical History  Diagnosis Date  . COPD (chronic obstructive pulmonary disease)   . Diabetes mellitus     2  . Gout   . Hypertension   . Hyperlipidemia   . Sleep apnea   . Arthritis   . Benign prostatic hypertrophy   . Pulmonary fibrosis 2014   Past Surgical History  Procedure Laterality Date  . Toe surgery     History   Social History  . Marital Status: Married    Spouse Name: N/A    Number of Children: 2  . Years of Education: N/A   Occupational History  . retired    Social History Main Topics  . Smoking status: Former Smoker -- 1.00 packs/day for 30 years    Types: Cigarettes    Quit date: 04/07/1996  . Smokeless tobacco: Never Used  . Alcohol Use: No     Comment: occassional beer  . Drug Use: No  . Sexual Activity: Not on file   Other Topics Concern  . Not on file   Social History Narrative  . No narrative on file       Review of Systems Currently without chest pain, palpitations or LE edema. No cough or hemoptysis. Dyspnea on exertion improved on oxygen. Rarely has arthralgias or requires pain meds     Objective:   Physical Exam BP 134/79  Pulse 71  Temp(Src) 98.3 F (36.8 C)  Wt 205 lb (92.987 kg)  SpO2 91% General -- alert, well-developed, NAD.  Lungs -- normal respiratory effort, no intercostal retractions, no accessory muscle use, and normal breath sounds.  Heart-- normal rate, regular rhythm, no murmur.  Extremities-- no pretibial edema bilaterally  Neurologic--  alert & oriented X3. Speech normal, gait normal, strength normal in all extremities.  Psych-- Cognition and judgment appear intact. Cooperative with normal attention span and concentration. No anxious appearing , no  depressed appearing.     Assessment & Plan:

## 2013-02-14 NOTE — Assessment & Plan Note (Signed)
Rarely uses hydrocodone

## 2013-05-10 ENCOUNTER — Telehealth: Payer: Self-pay | Admitting: Internal Medicine

## 2013-05-10 NOTE — Telephone Encounter (Signed)
Patient called stating [facility] has been trying to contact us to get new rx for atorvastatin 90 day supply.

## 2013-05-12 MED ORDER — ATORVASTATIN CALCIUM 40 MG PO TABS: 80.0000 mg | ORAL_TABLET | Freq: Every day | ORAL | Status: DC

## 2013-05-12 NOTE — Telephone Encounter (Signed)
Refill sent.

## 2013-05-18 ENCOUNTER — Telehealth: Payer: Self-pay | Admitting: *Deleted

## 2013-05-18 NOTE — Telephone Encounter (Signed)
PA paperwork for Atorvastatin faxed to insurance. JG//CMA

## 2013-05-20 ENCOUNTER — Telehealth: Payer: Self-pay | Admitting: *Deleted

## 2013-05-20 DIAGNOSIS — J449 Chronic obstructive pulmonary disease, unspecified: Secondary | ICD-10-CM

## 2013-05-20 DIAGNOSIS — J4489 Other specified chronic obstructive pulmonary disease: Secondary | ICD-10-CM

## 2013-05-20 DIAGNOSIS — J8410 Pulmonary fibrosis, unspecified: Secondary | ICD-10-CM

## 2013-05-20 NOTE — Telephone Encounter (Signed)
Received [facility] pulm rehab O2 satsPer Dr. Elsworth Soho we need to change pt O2 to continuous 3-6 L/M during exercise. I have sent order for pt.   Pt is also aware of this. I have placed fax in scan. Nothing further needed

## 2013-06-03 NOTE — Telephone Encounter (Signed)
Patient called and stated that wen sent the wrong qty in for his Atorvastatin. It was suppose to be a 90 day supply 180 quantity.

## 2013-06-07 ENCOUNTER — Other Ambulatory Visit: Payer: Self-pay | Admitting: *Deleted

## 2013-06-07 MED ORDER — ATORVASTATIN CALCIUM 80 MG PO TABS: 80.0000 mg | ORAL_TABLET | Freq: Every day | ORAL | Status: DC

## 2013-06-07 NOTE — Telephone Encounter (Signed)
Called and spoke with [facility] and they stated that insurance would pay for Atorvastatin 80mg  tablet once a day and that his currently dose of 40mg  twice a day would require a quantity limit exception. Called and spoke with patient and he would like to change to Atorvastatin 80mg  once a day. New script sent in to [facility] for a 90 day supply. JG//CMA

## 2013-06-14 ENCOUNTER — Telehealth: Payer: Self-pay

## 2013-06-14 NOTE — Telephone Encounter (Addendum)
  Medication List and allergies:  Reviewed and updated  90 day supply/mail order: [facility] Local prescriptions: Bradley Johns [facility]  Immunizations due: Shingles  A/P:   No changes to FH, PSH or Personal Hx Flu vaccine--02/2013 Tdap--01/2010 PNA--07/2008 Shingles--due CCS--Had a Cscope 4-11 (had a + hemocult), bx ----> tubular adenoma, next Cscope per GI PSA--03/2011--0.58  To Discuss with Provider: Currently on 4L continuous oxygen colcrys refill Has not been taking the flomax for about a year. States that he has nocturia q 2 hours. During the day is normal.

## 2013-06-15 ENCOUNTER — Telehealth: Payer: Self-pay | Admitting: Internal Medicine

## 2013-06-15 ENCOUNTER — Ambulatory Visit (INDEPENDENT_AMBULATORY_CARE_PROVIDER_SITE_OTHER): Payer: Medicare PPO | Admitting: Internal Medicine

## 2013-06-15 ENCOUNTER — Encounter: Payer: Self-pay | Admitting: Internal Medicine

## 2013-06-15 VITALS — BP 143/78 | HR 80 | Temp 97.8°F | Ht 68.0 in | Wt 207.0 lb

## 2013-06-15 DIAGNOSIS — E785 Hyperlipidemia, unspecified: Secondary | ICD-10-CM

## 2013-06-15 DIAGNOSIS — Z0000 Encounter for general adult medical examination without abnormal findings: Secondary | ICD-10-CM

## 2013-06-15 DIAGNOSIS — I10 Essential (primary) hypertension: Secondary | ICD-10-CM

## 2013-06-15 DIAGNOSIS — Z23 Encounter for immunization: Secondary | ICD-10-CM

## 2013-06-15 DIAGNOSIS — N400 Enlarged prostate without lower urinary tract symptoms: Secondary | ICD-10-CM

## 2013-06-15 DIAGNOSIS — J8410 Pulmonary fibrosis, unspecified: Secondary | ICD-10-CM

## 2013-06-15 DIAGNOSIS — M109 Gout, unspecified: Secondary | ICD-10-CM

## 2013-06-15 LAB — COMPREHENSIVE METABOLIC PANEL
ALT: 20 U/L (ref 0–53)
AST: 27 U/L (ref 0–37)
Albumin: 4.3 g/dL (ref 3.5–5.2)
Alkaline Phosphatase: 59 U/L (ref 39–117)
BUN: 15 mg/dL (ref 6–23)
CO2: 28 mEq/L (ref 19–32)
Calcium: 9.7 mg/dL (ref 8.4–10.5)
Chloride: 105 mEq/L (ref 96–112)
Creatinine, Ser: 1.2 mg/dL (ref 0.4–1.5)
GFR: 79.37 mL/min (ref >60.00)
Glucose, Bld: 111 mg/dL — ABNORMAL HIGH (ref 70–99)
Potassium: 4.4 mEq/L (ref 3.5–5.1)
Sodium: 140 mEq/L (ref 135–145)
Total Bilirubin: 0.8 mg/dL (ref 0.3–1.2)
Total Protein: 7.2 g/dL (ref 6.0–8.3)

## 2013-06-15 LAB — PSA: PSA: 0.64 ng/mL (ref 0.10–4.00)

## 2013-06-15 LAB — LIPID PANEL
Cholesterol: 145 mg/dL (ref 0–200)
HDL: 51.2 mg/dL (ref >39.00)
LDL Cholesterol: 75 mg/dL (ref 0–99)
Total CHOL/HDL Ratio: 3
Triglycerides: 92 mg/dL (ref 0.0–149.0)
VLDL: 18.4 mg/dL (ref 0.0–40.0)

## 2013-06-15 LAB — URIC ACID: Uric Acid, Serum: 7.4 mg/dL (ref 4.0–7.8)

## 2013-06-15 MED ORDER — COLCHICINE 0.6 MG PO TABS: 0.6000 mg | ORAL_TABLET | Freq: Every day | ORAL | Status: DC

## 2013-06-15 NOTE — Telephone Encounter (Signed)
Relevant patient education mailed to patient.  

## 2013-06-15 NOTE — Assessment & Plan Note (Signed)
All medicines as needed, check a uric acid, RF  meds

## 2013-06-15 NOTE — Assessment & Plan Note (Signed)
Good compliance with medication, Iabs

## 2013-06-15 NOTE — Assessment & Plan Note (Signed)
Currently well controlled, no change, labs

## 2013-06-15 NOTE — Progress Notes (Signed)
Subjective:    Patient ID: Bradley Johns, male    DOB: Mar 09, 1935, 79 y.o.   MRN: [id number]  DOS:  06/15/2013 Type of  visit:    Here for Medicare AWV: 1.         Risk factors based on Past M, S, F history:reviewed   2.         Physical Activities: active, on pulmonary rehab. 3.         Depression/mood: (-) screening  4.         Hearing: no complaints, no problems noted   5.         ADL's: totally independent  , drives  6.         Fall Risk: low risk, no h/o falls , counseled   7.         Home Safety: does feel safe at home   8.         Height, weight, &visual acuity: see VS, vision corrected , sees eye doctor routinely   9.         Counseling: yes , see below   10.       Labs ordered based on risk factors: yes   11.       Referral Coordination: if needed   12.       Care Plan: see a/p   13.      Cognitive Assessment , motor skills, cognition and memory seem appropriate  In addition, we discussed the following issues Gout , has occasional flareup, managed with Vicodin and colcrys prn  cholesterol--good compliance w/ medication, no apparent side effects Hypertension, good medication compliance, ambulatory BPs 130, 140s. BPH, continue with nocturia,used Flomax without much help, has not been taking Flomax in a while. Denies any actual difficulty urinating, dysuria or gross hematuria.   ROS Diet-- eating less out otherwise ~ the same  No  CP No palpitations, no lower extremity edema + DOE but improved on O2 Denies  nausea, vomiting diarrhea Denies  blood in the stools Some dry cough, no sputum production, (-)hemoptysis      Past Medical History  Diagnosis Date  . COPD (chronic obstructive pulmonary disease)   . Diabetes mellitus     2  . Gout   . Hypertension   . Hyperlipidemia   . Sleep apnea   . Arthritis   . Benign prostatic hypertrophy   . Pulmonary fibrosis 2014    Past Surgical History  Procedure Laterality Date  . Toe surgery      History   Social  History  . Marital Status: Married    Spouse Name: N/A    Number of Children: 2  . Years of Education: N/A   Occupational History  . retired    Social History Main Topics  . Smoking status: Former Smoker -- 1.00 packs/day for 30 years    Types: Cigarettes    Quit date: 04/07/1996  . Smokeless tobacco: Never Used  . Alcohol Use: Yes     Comment: occassional beer  . Drug Use: No  . Sexual Activity: Not on file   Other Topics Concern  . Not on file   Social History Narrative   Lives w/ wife     Family History  Problem Relation Age of Onset  . Liver disease Mother   . Coronary artery disease Father 88  . Diabetes Sister   . Cancer Brother     type?  Marland Kitchen Hypertension Neg Hx   .  Hyperlipidemia Neg Hx   . Heart attack Neg Hx   . Prostate cancer Neg Hx   . Colon cancer Neg Hx        Medication List       This list is accurate as of: 06/15/13  5:46 PM.  Always use your most recent med list.               albuterol 108 (90 BASE) MCG/ACT inhaler  Commonly known as:  VENTOLIN HFA  Inhale 2 puffs into the lungs every 6 (six) hours as needed for wheezing.     amLODipine 10 MG tablet  Commonly known as:  NORVASC  Take 1 tablet (10 mg total) by mouth daily.     aspirin 81 MG tablet  Take 81 mg by mouth daily.     atorvastatin 80 MG tablet  Commonly known as:  LIPITOR  Take 1 tablet (80 mg total) by mouth daily.     B-D ULTRAFINE III SHORT PEN 31G X 8 MM Misc  Generic drug:  Insulin Pen Needle     carvedilol 12.5 MG tablet  Commonly known as:  COREG  Take 1 tablet (12.5 mg total) by mouth 2 (two) times daily with a meal.     colchicine 0.6 MG tablet  Take 1 tablet (0.6 mg total) by mouth daily.     doxazosin 4 MG tablet  Commonly known as:  CARDURA  Take 1 tablet (4 mg total) by mouth at bedtime.     HUMALOG MIX 75/25 PEN [place]  Inject 40 Units into the skin daily.     HYDROcodone-acetaminophen 5-500 MG per tablet  Commonly known as:  VICODIN  Take 1  tablet by mouth every 6 (six) hours as needed.     losartan 50 MG tablet  Commonly known as:  COZAAR  Take 1 tablet (50 mg total) by mouth daily.     metFORMIN 500 MG tablet  Commonly known as:  GLUCOPHAGE  Take 500 mg by mouth 2 (two) times daily with a meal.     multivitamin tablet  Take 1 tablet by mouth daily.     MYRBETRIQ 25 MG Tb24 tablet  Generic drug:  mirabegron ER  Take 25 mg by mouth daily.     pioglitazone 45 MG tablet  Commonly known as:  ACTOS  Take 45 mg by mouth daily.           Objective:   Physical Exam BP 143/78  Pulse 80  Temp(Src) 97.8 F (36.6 C)  Ht 5\' 8"  (1.727 m)  Wt 207 lb (93.895 kg)  BMI 31.48 kg/m2  SpO2 88% General -- alert, well-developed, NAD.    Lungs -- normal respiratory effort, no intercostal retractions, no accessory muscle use, and Few dry crackles at bases.  Heart-- normal rate, regular rhythm, no murmur.  Abdomen-- Not distended, good bowel sounds,soft, non-tender. Rectal-- No external abnormalities noted. Normal sphincter tone. No rectal masses or tenderness. Brown stool  Prostate--Prostate gland firm and smooth, mild  enlargement, ? Multiple small nodules? Not tender, no dominant nodule Extremities-- no pretibial edema bilaterally  Neurologic--  alert & oriented X3. Speech normal, gait normal, strength normal in all extremities.  Psych-- Cognition and judgment appear intact. Cooperative with normal attention span and concentration. No anxious or depressed appearing.      Assessment & Plan:

## 2013-06-15 NOTE — Assessment & Plan Note (Signed)
Currently on albuterol as needed, pulmonary rehabilitation, and oxygen ---> feeling well. Not taking Spiriva

## 2013-06-15 NOTE — Patient Instructions (Addendum)
Get your blood work before you leave , also get a UDS  Try the samples of myrbetriq 1 tab a day, increase to 2 tabs a day if needed  Next visit is for routine check up regards your prostate in 3 months  in  No need to come back fasting Please make an appointment     Fall Prevention and [place] cause injuries and can affect all age groups. It is possible to use preventive measures to significantly decrease the likelihood of falls. There are many simple measures which can make your home safer and prevent falls. OUTDOORS  Repair cracks and edges of walkways and driveways.  Remove high doorway thresholds.  Trim shrubbery on the main path into your home.  Have good outside lighting.  Clear walkways of tools, rocks, debris, and clutter.  Check that handrails are not broken and are securely fastened. Both sides of steps should have handrails.  Have leaves, snow, and ice cleared regularly.  Use sand or salt on walkways during winter months.  In the garage, clean up grease or oil spills. BATHROOM  Install night lights.  Install grab bars by the toilet and in the tub and shower.  Use non-skid mats or decals in the tub or shower.  Place a plastic non-slip stool in the shower to sit on, if needed.  Keep floors dry and clean up all water on the floor immediately.  Remove soap buildup in the tub or shower on a regular basis.  Secure bath mats with non-slip, double-sided rug tape.  Remove throw rugs and tripping hazards from the floors. BEDROOMS  Install night lights.  Make sure a bedside light is easy to reach.  Do not use oversized bedding.  Keep a telephone by your bedside.  Have a firm chair with side arms to use for getting dressed.  Remove throw rugs and tripping hazards from the floor. KITCHEN  Keep handles on pots and pans turned toward the center of the stove. Use back burners when possible.  Clean up spills quickly and allow time for  drying.  Avoid walking on wet floors.  Avoid hot utensils and knives.  Position shelves so they are not too high or low.  Place commonly used objects within easy reach.  If necessary, use a sturdy step stool with a grab bar when reaching.  Keep electrical cables out of the way.  Do not use floor polish or wax that makes floors slippery. If you must use wax, use non-skid floor wax.  Remove throw rugs and tripping hazards from the floor. STAIRWAYS  Never leave objects on stairs.  Place handrails on both sides of stairways and use them. Fix any loose handrails. Make sure handrails on both sides of the stairways are as long as the stairs.  Check carpeting to make sure it is firmly attached along stairs. Make repairs to worn or loose carpet promptly.  Avoid placing throw rugs at the top or bottom of stairways, or properly secure the rug with carpet tape to prevent slippage. Get rid of throw rugs, if possible.  Have an electrician put in a light switch at the top and bottom of the stairs. OTHER FALL PREVENTION TIPS  Wear low-heel or rubber-soled shoes that are supportive and fit well. Wear closed toe shoes.  When using a stepladder, make sure it is fully opened and both spreaders are firmly locked. Do not climb a closed stepladder.  Add color or contrast paint or tape to grab bars  and handrails in your home. Place contrasting color strips on first and last steps.  Learn and use mobility aids as needed. Install an electrical emergency response system.  Turn on lights to avoid dark areas. Replace light bulbs that burn out immediately. Get light switches that glow.  Arrange furniture to create clear pathways. Keep furniture in the same place.  Firmly attach carpet with non-skid or double-sided tape.  Eliminate uneven floor surfaces.  Select a carpet pattern that does not visually hide the edge of steps.  Be aware of all pets. OTHER HOME SAFETY TIPS  Set the water temperature  for 120 F (48.8 C).  Keep emergency numbers on or near the telephone.  Keep smoke detectors on every level of the home and near sleeping areas. Document Released: 03/14/2002 Document Revised: 09/23/2011 Document Reviewed: 06/13/2011 [facility] Patient Information 2014 [place].

## 2013-06-15 NOTE — Assessment & Plan Note (Addendum)
Td   2011 pneumonia shot 2001    PNM shot -13  Provided today shingles shot, discussed before    had a Cscope w/ Drr Collene Mares before, again Cscope 4-11 (had a + hemocult), bx ----> tubular adenoma, next Cscope per GI Diet-exercise discussed

## 2013-06-15 NOTE — Assessment & Plan Note (Addendum)
Has not taken Flomax in a while, continue with nocturia , DRE >>>>  small nodules?. Plan:  PSA , Is elevated, consider urology eval

## 2013-06-20 ENCOUNTER — Encounter: Payer: Self-pay | Admitting: *Deleted

## 2013-06-28 ENCOUNTER — Telehealth: Payer: Self-pay

## 2013-06-28 NOTE — Telephone Encounter (Signed)
UDS: 06/15/2013 Nothing listed for testing. Hydrocodone (PRN) Low risk per Dr Larose Kells (next UDS 1 Year)

## 2013-07-19 ENCOUNTER — Encounter: Payer: Self-pay | Admitting: Internal Medicine

## 2013-08-02 ENCOUNTER — Encounter: Payer: Self-pay | Admitting: Internal Medicine

## 2013-08-02 ENCOUNTER — Ambulatory Visit (INDEPENDENT_AMBULATORY_CARE_PROVIDER_SITE_OTHER): Payer: Medicare PPO | Admitting: Internal Medicine

## 2013-08-02 VITALS — BP 124/65 | HR 77 | Temp 97.9°F | Wt 208.0 lb

## 2013-08-02 DIAGNOSIS — I10 Essential (primary) hypertension: Secondary | ICD-10-CM

## 2013-08-02 DIAGNOSIS — J449 Chronic obstructive pulmonary disease, unspecified: Secondary | ICD-10-CM

## 2013-08-02 DIAGNOSIS — J4489 Other specified chronic obstructive pulmonary disease: Secondary | ICD-10-CM

## 2013-08-02 MED ORDER — AZITHROMYCIN 250 MG PO TABS
ORAL_TABLET | ORAL | Status: DC
Start: 2013-08-02 — End: 2013-08-19

## 2013-08-02 NOTE — Patient Instructions (Signed)
For cough, take Mucinex DM twice a day as needed  If the cough continue, take hydrocodone at night Use albuterol as needed for wheezing or persistent cough Take the antibiotic as prescribed  (zithromax) if no better in 2-3 days  Call if no better in few days Call anytime if the symptoms are severe

## 2013-08-02 NOTE — Assessment & Plan Note (Signed)
Increased respiratory symptoms likely COPD with intercurrent viral illness or bronchitis. Plan:  See Instructions Encouraged to use albuterol as needed.

## 2013-08-02 NOTE — Progress Notes (Signed)
Subjective:    Patient ID: Bradley Johns, male    DOB: 1935/03/27, 79 y.o.   MRN: [id number]  DOS:  08/02/2013 Type of  visit: Acute visit , here with his wife 3 days history of cough, worse at night, having a hard time sleeping. Taking OTC guaifenesin. Symptoms started after he cut the grass in his yard Also concerned about his BP, see assessment and plan.    ROS No fever or chills Some runny nose and sore throat. No nasal discharge. Difficulty breathing slightly more than baseline, no lower  extremity edema. + For wheezing, no sputum production.  Past Medical History  Diagnosis Date  . COPD (chronic obstructive pulmonary disease)   . Diabetes mellitus     2  . Gout   . Hypertension   . Hyperlipidemia   . Sleep apnea   . Arthritis   . Benign prostatic hypertrophy   . Pulmonary fibrosis 2014    Past Surgical History  Procedure Laterality Date  . Toe surgery      History   Social History  . Marital Status: Married    Spouse Name: N/A    Number of Children: 2  . Years of Education: N/A   Occupational History  . retired    Social History Main Topics  . Smoking status: Former Smoker -- 1.00 packs/day for 30 years    Types: Cigarettes    Quit date: 04/07/1996  . Smokeless tobacco: Never Used  . Alcohol Use: Yes     Comment: occassional beer  . Drug Use: No  . Sexual Activity: Not on file   Other Topics Concern  . Not on file   Social History Narrative   Lives w/ wife        Medication List       This list is accurate as of: 08/02/13 12:53 PM.  Always use your most recent med list.               albuterol 108 (90 BASE) MCG/ACT inhaler  Commonly known as:  VENTOLIN HFA  Inhale 2 puffs into the lungs every 6 (six) hours as needed for wheezing.     amLODipine 10 MG tablet  Commonly known as:  NORVASC  Take 1 tablet (10 mg total) by mouth daily.     aspirin 81 MG tablet  Take 81 mg by mouth daily.     atorvastatin 80 MG tablet  Commonly  known as:  LIPITOR  Take 1 tablet (80 mg total) by mouth daily.     azithromycin 250 MG tablet  Commonly known as:  ZITHROMAX Z-PAK  As directed     B-D ULTRAFINE III SHORT PEN 31G X 8 MM Misc  Generic drug:  Insulin Pen Needle     carvedilol 12.5 MG tablet  Commonly known as:  COREG  Take 1 tablet (12.5 mg total) by mouth 2 (two) times daily with a meal.     colchicine 0.6 MG tablet  Take 1 tablet (0.6 mg total) by mouth daily.     doxazosin 4 MG tablet  Commonly known as:  CARDURA  Take 1 tablet (4 mg total) by mouth at bedtime.     HUMALOG MIX 75/25 PEN [place]  Inject 40 Units into the skin daily.     HYDROcodone-acetaminophen 5-500 MG per tablet  Commonly known as:  VICODIN  Take 1 tablet by mouth every 6 (six) hours as needed.     losartan 50 MG tablet  Commonly known as:  COZAAR  Take 1 tablet (50 mg total) by mouth daily.     metFORMIN 500 MG tablet  Commonly known as:  GLUCOPHAGE  Take 500 mg by mouth 2 (two) times daily with a meal.     multivitamin tablet  Take 1 tablet by mouth daily.     MYRBETRIQ 25 MG Tb24 tablet  Generic drug:  mirabegron ER  Take 25 mg by mouth daily.     pioglitazone 45 MG tablet  Commonly known as:  ACTOS  Take 45 mg by mouth daily.           Objective:   Physical Exam BP 124/65  Pulse 77  Temp(Src) 97.9 F (36.6 C)  Wt 208 lb (94.348 kg)  SpO2 83% General -- alert, well-developed, NAD.  HEENT-- Not pale. TMs normal, throat symmetric, no redness or discharge. Face symmetric, sinuses not tender to palpation. Nose slt  congested. Lungs -- normal respiratory effort, no intercostal retractions, no accessory muscle use, and decreased  breath sounds.  Heart-- normal rate, regular rhythm, no murmur.   Extremities-- no pretibial edema bilaterally  Neurologic--  alert & oriented X3. Speech normal, gait normal, strength normal in all extremities.  Psych-- Cognition and judgment appear intact. Cooperative with normal attention span  and concentration. No anxious or depressed appearing.      Assessment & Plan:

## 2013-08-02 NOTE — Assessment & Plan Note (Signed)
BP was elevated one time, wonders if he needs to increase meds. He check his BP  4 or 5 times a week and is usually within normal. Advise patient to continue checking, let me know if his BP is consistently elevated then will need to adjust his medications.

## 2013-08-03 ENCOUNTER — Telehealth: Payer: Self-pay | Admitting: Internal Medicine

## 2013-08-03 NOTE — Telephone Encounter (Signed)
Relevant patient education assigned to patient using Emmi. ° °

## 2013-08-19 ENCOUNTER — Encounter: Payer: Self-pay | Admitting: Pulmonary Disease

## 2013-08-19 ENCOUNTER — Ambulatory Visit (INDEPENDENT_AMBULATORY_CARE_PROVIDER_SITE_OTHER): Payer: Medicare PPO | Admitting: Pulmonary Disease

## 2013-08-19 VITALS — BP 132/70 | HR 67 | Ht 68.0 in | Wt 205.0 lb

## 2013-08-19 DIAGNOSIS — R058 Other specified cough: Secondary | ICD-10-CM | POA: Insufficient documentation

## 2013-08-19 DIAGNOSIS — R05 Cough: Secondary | ICD-10-CM | POA: Insufficient documentation

## 2013-08-19 DIAGNOSIS — J449 Chronic obstructive pulmonary disease, unspecified: Secondary | ICD-10-CM

## 2013-08-19 DIAGNOSIS — J8410 Pulmonary fibrosis, unspecified: Secondary | ICD-10-CM

## 2013-08-19 DIAGNOSIS — R059 Cough, unspecified: Secondary | ICD-10-CM

## 2013-08-19 MED ORDER — FLUTICASONE PROPIONATE 50 MCG/ACT NA SUSP: 2.0000 | Freq: Every day | NASAL | Status: DC

## 2013-08-19 NOTE — Patient Instructions (Signed)
Nasal irrigation (salt water nose spray) daily  Flonase two sprays each nostril daily >> use after nasal irrigation Ventolin two puffs up to four times per day as needed for cough, wheeze, or chest congestion Follow up with Dr. Elsworth Soho in 2 weeks as previously scheduled

## 2013-08-19 NOTE — Assessment & Plan Note (Signed)
His current symptoms are likely related to post nasal drip.  Will have him use nasal irrigation and flonase.

## 2013-08-19 NOTE — Assessment & Plan Note (Signed)
Seems stable at present.  He can continue prn ventolin. Don't think he needs Abx, prednisone, or CXR at this time.

## 2013-08-19 NOTE — Progress Notes (Signed)
Chief Complaint  Patient presents with  . Acute Visit    Presents today with dry cough. PCP put him on Mucinex DM with no relief. Onset was 1 month ago.    History of Present Illness: Bradley Johns is a 79 y.o. male former smoker with COPD and pulmonary fibrosis on home oxygen.  He is followed by Dr. Elsworth Soho.  He has noticed a cough for the past 1 month.  He has sinus congestion and pressure, and post-nasal drip.  This triggers his cough.  He usually does not have sputum production.  He will get intermittent wheezing >> he uses ventolin (but not much) and this helps.  He has been getting a scratchy throat, and sore ribs with cough.  He denies fever.  He gets coughing spells with talking.  He denies rash, chest pain, or leg swelling.  TESTS: PFT 11/23/12 >> FEV1 1.66 (67%), FEV1% 76, TLC 3.94 (60%), DLCO 32%, + BD CT chest 12/23/12 >> severe centrilobular emphysema, scattered honeycombing, mild cylindrical BTX LLL Echo 12/30/12 >> EF 60 to 65%, mild LA dilation  Bradley Johns  has a past medical history of COPD (chronic obstructive pulmonary disease); Diabetes mellitus; Gout; Hypertension; Hyperlipidemia; Sleep apnea; Arthritis; Benign prostatic hypertrophy; and Pulmonary fibrosis (2014).  Bradley Johns  has past surgical history that includes Toe Surgery.  Prior to Admission medications   Medication Sig Start Date End Date Taking? Authorizing Provider  albuterol (VENTOLIN HFA) 108 (90 BASE) MCG/ACT inhaler Inhale 2 puffs into the lungs every 6 (six) hours as needed for wheezing. 12/14/12  Yes Rigoberto Noel, MD  amLODipine (NORVASC) 10 MG tablet Take 1 tablet (10 mg total) by mouth daily. 02/14/13  Yes Colon Branch, MD  aspirin 81 MG tablet Take 81 mg by mouth daily.     Yes Historical Provider, MD  atorvastatin (LIPITOR) 80 MG tablet Take 1 tablet (80 mg total) by mouth daily. 06/07/13  Yes Colon Branch, MD  B-D ULTRAFINE III SHORT PEN 31G X 8 MM MISC  08/30/11  Yes Historical Provider, MD   carvedilol (COREG) 12.5 MG tablet Take 1 tablet (12.5 mg total) by mouth 2 (two) times daily with a meal. 02/14/13  Yes Colon Branch, MD  colchicine 0.6 MG tablet Take 1 tablet (0.6 mg total) by mouth daily. 06/15/13  Yes Colon Branch, MD  doxazosin (CARDURA) 4 MG tablet Take 1 tablet (4 mg total) by mouth at bedtime. 02/14/13  Yes Colon Branch, MD  HYDROcodone-acetaminophen (VICODIN) 5-500 MG per tablet Take 1 tablet by mouth every 6 (six) hours as needed. 04/06/12  Yes Colon Branch, MD  Insulin Lispro Prot & Lispro (HUMALOG MIX 75/25 PEN [place]) Inject 40 Units into the skin daily.    Yes Historical Provider, MD  losartan (COZAAR) 50 MG tablet Take 1 tablet (50 mg total) by mouth daily. 02/14/13  Yes Colon Branch, MD  metFORMIN (GLUCOPHAGE) 500 MG tablet Take 500 mg by mouth 2 (two) times daily with a meal.     Yes Historical Provider, MD  mirabegron ER (MYRBETRIQ) 25 MG TB24 tablet Take 25 mg by mouth daily.   Yes Historical Provider, MD  Multiple Vitamin (MULTIVITAMIN) tablet Take 1 tablet by mouth daily.     Yes Historical Provider, MD  pioglitazone (ACTOS) 45 MG tablet Take 45 mg by mouth daily.   Yes Historical Provider, MD    No Known Allergies   Physical Exam:  General - No  distress, wearing oxygen ENT - No sinus tenderness, clear nasal drainage, no oral exudate, no LAN Cardiac - s1s2 regular, no murmur Chest - No wheeze/rales/dullness Back - No focal tenderness Abd - Soft, non-tender Ext - No edema Neuro - Normal strength Skin - No rashes Psych - normal mood, and behavior   Assessment/Plan:  Bradley Mires, MD [facility] Pager:  [phone]

## 2013-08-19 NOTE — Assessment & Plan Note (Signed)
Mild on CT chest from September 2014.

## 2013-09-01 ENCOUNTER — Ambulatory Visit: Payer: Medicare PPO | Admitting: Pulmonary Disease

## 2013-09-15 ENCOUNTER — Other Ambulatory Visit: Payer: Self-pay | Admitting: Hematology & Oncology

## 2013-09-15 ENCOUNTER — Ambulatory Visit (INDEPENDENT_AMBULATORY_CARE_PROVIDER_SITE_OTHER): Payer: Medicare PPO | Admitting: Pulmonary Disease

## 2013-09-15 ENCOUNTER — Ambulatory Visit (HOSPITAL_BASED_OUTPATIENT_CLINIC_OR_DEPARTMENT_OTHER)
Admission: RE | Admit: 2013-09-15 | Discharge: 2013-09-15 | Disposition: A | Discharge status: Home / Self Care | Payer: Medicare PPO | Source: Ambulatory Visit | Attending: Pulmonary Disease | Admitting: Pulmonary Disease

## 2013-09-15 ENCOUNTER — Encounter: Payer: Self-pay | Admitting: Pulmonary Disease

## 2013-09-15 VITALS — BP 132/68 | HR 71 | Ht 68.5 in | Wt 208.0 lb

## 2013-09-15 DIAGNOSIS — J8410 Pulmonary fibrosis, unspecified: Secondary | ICD-10-CM

## 2013-09-15 DIAGNOSIS — J438 Other emphysema: Secondary | ICD-10-CM

## 2013-09-15 DIAGNOSIS — J439 Emphysema, unspecified: Secondary | ICD-10-CM

## 2013-09-15 MED ORDER — FLUTICASONE FUROATE-VILANTEROL 100-25 MCG/INH IN AEPB: 1.0000 | INHALATION_SPRAY | Freq: Every day | RESPIRATORY_TRACT | Status: DC

## 2013-09-15 NOTE — Assessment & Plan Note (Addendum)
Cough may be due to allergies or fibrosis CXR today Prednisone 5 mg tabs  Take 2 tabs daily with food x 7ds, then 1 tab daily with food x 7ds then STOP Breathing test with next visit at [facility] in 32months We discussed new drugs for pulmonary fibrosis & decided to hold off Ct O2 4-6 L during exertion

## 2013-09-15 NOTE — Progress Notes (Signed)
   Subjective:    Patient ID: Bradley Johns, male    DOB: 08/16/1934, 79 y.o.   MRN: [id number]  HPI  PCP- Larose Kells   79 year old remote smoker for FU of dyspnea and interstitial lung disease.   His oxygen saturation was 90% on walking into the office, he dropped to 81% on walking 1 lap and recovered with oxygen. He required 3 L of oxygen to maintain saturation of 90% on walking.  Chest x-ray from 10/11/12 when compared to 2006 showed hyperinflation and bibasal interstitial markings that were progressive from prior.  PFTs 12/2012- no airway obstruction with FEV1 of 67% and FVC of 65% post bronchodilator with 12 percent bronchodilator response. There was severe intraparenchymal restriction with TLC of 60% and DLCO of 32%.  HRCT-Severe central lobular emphysema with basilar fibrosis and some honeycombing.  ESR 6, ANA, RA factor, CCP neg   He smoked about a pack per day until he quit in 1998. He is a retired Librarian, academic for [employer]. He was born in [place] lived in [place] for 20 years and moved to [place] in [place].   He was diagnosed with obstructive sleep apnea but is not compliant with CPAP    09/15/2013  Chief Complaint  Patient presents with  . Follow-up    c/o nonprod cough, improved since his visit with Dr. Halford Chessman but still present.  Wearing 02 more frequently, still using between 2-6 lpm depending on activity level.   More compliant with O2. He uses the oxygen rest or sleep. Does use it with exertion.  Albuterol MDI helps some  Completed rehab - O2 to continuous 3-6 L/M during exercise 6MW improved 25% from 1320 ft to 1650 ft Sugars ok Underwent cardiac testing - nml Sick visit for cough - felt due to allergies > flonase Cough persists  Past Medical History  Diagnosis Date  . COPD (chronic obstructive pulmonary disease)   . Diabetes mellitus     2  . Gout   . Hypertension   . Hyperlipidemia   . Sleep apnea   . Arthritis   . Benign prostatic hypertrophy   . Pulmonary  fibrosis 2014     Review of Systems neg for any significant sore throat, dysphagia, itching, sneezing, nasal congestion or excess/ purulent secretions, fever, chills, sweats, unintended wt loss, pleuritic or exertional cp, hempoptysis, orthopnea pnd or change in chronic leg swelling. Also denies presyncope, palpitations, heartburn, abdominal pain, nausea, vomiting, diarrhea or change in bowel or urinary habits, dysuria,hematuria, rash, arthralgias, visual complaints, headache, numbness weakness or ataxia.     Objective:   Physical Exam   Gen. Pleasant, well-nourished, in no distress, normal affect ENT - no lesions, no post nasal drip Neck: No JVD, no thyromegaly, no carotid bruits Lungs: no use of accessory muscles, no dullness to percussion, bibasal rales, no rhonchi  Cardiovascular: Rhythm regular, heart sounds  normal, no murmurs or gallops, no peripheral edema Abdomen: soft and non-tender, no hepatosplenomegaly, BS normal. Musculoskeletal: No deformities, no cyanosis or clubbing Neuro:  alert, non focal        Assessment & Plan:

## 2013-09-15 NOTE — Assessment & Plan Note (Signed)
Trial of BREO once daily - RINse mouth after use

## 2013-09-15 NOTE — Patient Instructions (Signed)
Cough may be due to allergies or fibrosis CXR today Trial of BREO once daily - RINse mouth after use Prednisone 5 mg tabs  Take 2 tabs daily with food x 7ds, then 1 tab daily with food x 7ds then STOP Breathing test with next visit at [facility] in 45months We discussed new drugs for pulmonary fibrosis & decided to hold off

## 2013-09-16 ENCOUNTER — Encounter: Payer: Self-pay | Admitting: Internal Medicine

## 2013-09-16 ENCOUNTER — Ambulatory Visit (INDEPENDENT_AMBULATORY_CARE_PROVIDER_SITE_OTHER): Payer: Medicare PPO | Admitting: Internal Medicine

## 2013-09-16 VITALS — BP 145/63 | HR 81 | Temp 98.5°F | Wt 207.0 lb

## 2013-09-16 DIAGNOSIS — I10 Essential (primary) hypertension: Secondary | ICD-10-CM

## 2013-09-16 DIAGNOSIS — N400 Enlarged prostate without lower urinary tract symptoms: Secondary | ICD-10-CM

## 2013-09-16 NOTE — Assessment & Plan Note (Signed)
Well-controlled, recent BMP normal. No change, monitor BPs. Next visit by 06-2014, will  see other doctors in the meantime

## 2013-09-16 NOTE — Assessment & Plan Note (Addendum)
DRE Today is normal, last PSA  < 1 Did not tolerated mybertriq but is oligosymptomatic at this time . Plan: Observation for now

## 2013-09-16 NOTE — Progress Notes (Signed)
Subjective:    Patient ID: Bradley Johns, male    DOB: Oct 26, 1934, 79 y.o.   MRN: [id number]  DOS:  09/16/2013 Type of  Visit: Followup from previous visit 06/15/2013 History: Prostate exam was abnormal, needs a DRE. He try mybertriq, it didn't help. BP today slightly elevated, at home it is usually 130. Chart and pertinent labs reviewed    ROS Use oxygen supplementation most of the time,respiratory symptoms at baseline.. Denies dysuria, gross nocturia, difficulty urinating. Still has some urinary frequency.   Past Medical History  Diagnosis Date  . COPD (chronic obstructive pulmonary disease)   . Diabetes mellitus     2  . Gout   . Hypertension   . Hyperlipidemia   . Sleep apnea   . Arthritis   . Benign prostatic hypertrophy   . Pulmonary fibrosis 2014    Past Surgical History  Procedure Laterality Date  . Toe surgery      History   Social History  . Marital Status: Married    Spouse Name: N/A    Number of Children: 2  . Years of Education: N/A   Occupational History  . retired    Social History Main Topics  . Smoking status: Former Smoker -- 1.00 packs/day for 30 years    Types: Cigarettes    Quit date: 04/07/1996  . Smokeless tobacco: Never Used  . Alcohol Use: Yes     Comment: occassional beer  . Drug Use: No  . Sexual Activity: Not on file   Other Topics Concern  . Not on file   Social History Narrative   Lives w/ wife        Medication List       This list is accurate as of: 09/16/13 11:04 AM.  Always use your most recent med list.               albuterol 108 (90 BASE) MCG/ACT inhaler  Commonly known as:  VENTOLIN HFA  Inhale 2 puffs into the lungs every 6 (six) hours as needed for wheezing.     amLODipine 10 MG tablet  Commonly known as:  NORVASC  Take 1 tablet (10 mg total) by mouth daily.     aspirin 81 MG tablet  Take 81 mg by mouth daily.     atorvastatin 80 MG tablet  Commonly known as:  LIPITOR  Take 1 tablet (80 mg  total) by mouth daily.     B-D ULTRAFINE III SHORT PEN 31G X 8 MM Misc  Generic drug:  Insulin Pen Needle     carvedilol 12.5 MG tablet  Commonly known as:  COREG  Take 1 tablet (12.5 mg total) by mouth 2 (two) times daily with a meal.     colchicine 0.6 MG tablet  Take 0.6 mg by mouth daily as needed.     doxazosin 4 MG tablet  Commonly known as:  CARDURA  Take 1 tablet (4 mg total) by mouth at bedtime.     Fluticasone Furoate-Vilanterol 100-25 MCG/INH Aepb  Commonly known as:  BREO ELLIPTA  Inhale 1 puff into the lungs daily.     HUMALOG MIX 75/25 PEN [place]  Inject 30 Units into the skin daily.     HYDROcodone-acetaminophen 5-500 MG per tablet  Commonly known as:  VICODIN  Take 1 tablet by mouth every 6 (six) hours as needed.     losartan 50 MG tablet  Commonly known as:  COZAAR  Take 1 tablet (50 mg  total) by mouth daily.     metFORMIN 500 MG tablet  Commonly known as:  GLUCOPHAGE  Take 500 mg by mouth 2 (two) times daily with a meal.     multivitamin tablet  Take 1 tablet by mouth daily.     pioglitazone 45 MG tablet  Commonly known as:  ACTOS  Take 45 mg by mouth daily.           Objective:   Physical Exam BP 145/63  Pulse 81  Temp(Src) 98.5 F (36.9 C)  Wt 207 lb (93.895 kg)  SpO2 83% General -- alert, well-developed, NAD.   Lungs -- normal respiratory effort, no intercostal retractions, no accessory muscle use, and decreased  breath sounds.  Heart-- normal rate, regular rhythm, no murmur.  DRE: Prostate not nodular, slightly increased in size, nontender. Brown stools.  Psych-- Cognition and judgment appear intact. Cooperative with normal attention span and concentration. No anxious or depressed appearing.        Assessment & Plan:

## 2013-09-16 NOTE — Progress Notes (Signed)
Pre visit review using our clinic review tool, if applicable. No additional management support is needed unless otherwise documented below in the visit note. 

## 2013-09-16 NOTE — Patient Instructions (Addendum)
Next visit is for a physical exam by 3 -2016 , fasting Please make an appointment    Check the  blood pressure 2 or 3 times a month  be sure it is between 110/60 and 140/85. Ideal blood pressure is 120/80. If it is consistently higher or lower, let me know

## 2013-09-22 ENCOUNTER — Telehealth: Payer: Self-pay | Admitting: Internal Medicine

## 2013-09-22 NOTE — Telephone Encounter (Signed)
Caller name:  Call back [phone] Pharmacy:[facility] [place], [address]   Reason for call:   Pt states that pharmacy has been sending over a refill request for RX carvedilol (COREG) 12.5 MG tablet.  Can we get this request sent to them

## 2013-09-23 MED ORDER — CARVEDILOL 12.5 MG PO TABS: 12.5000 mg | ORAL_TABLET | Freq: Two times a day (BID) | ORAL | Status: DC

## 2013-09-23 NOTE — Telephone Encounter (Signed)
rx refilled.

## 2013-10-06 IMAGING — CT CT CHEST W/O CM
2 of 3 series · 15 of 36 positions shown, 18 images · non-contrast
Comparison: 10/12/2012 and report from prior CT chest dated
04/16/1999.

CLINICAL DATA: Pulmonary fibrosis. Shortness of Breath. Diabetes.
COPD.

EXAM:
CT CHEST WITHOUT CONTRAST
TECHNIQUE: Multidetector CT imaging of the chest was performed following the
standard protocol without IV contrast. High-resolution imaging was
also employed.

[Series 2: high res std 3.0 st · axial · 0.69mm/px · z∈[+1038,+1280]mm · 12 of 97 slices shown, 15 images]
[im 8/97  mediastinal]
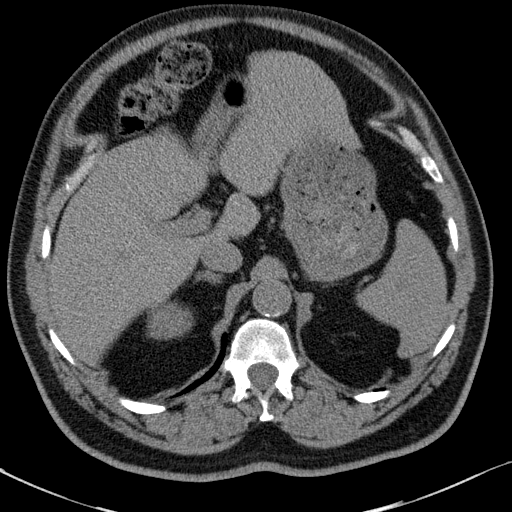
[im 8/97  lung]
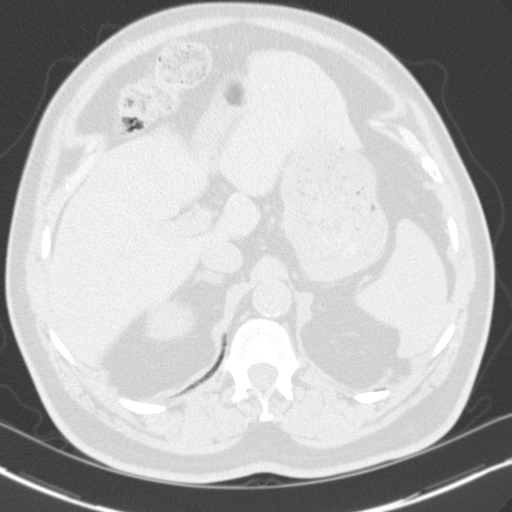
[im 15/97  lung]
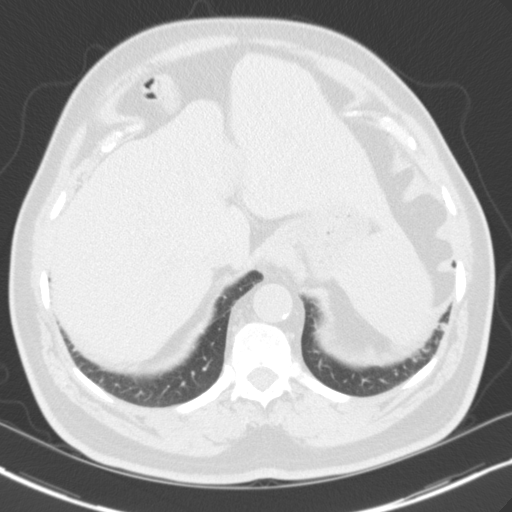
[im 22/97  lung]
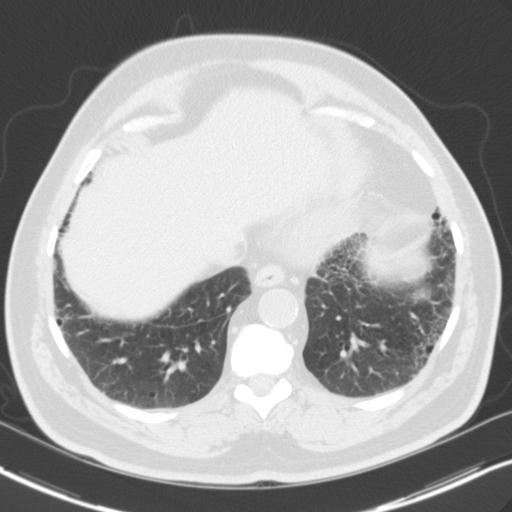
[im 29/97  lung]
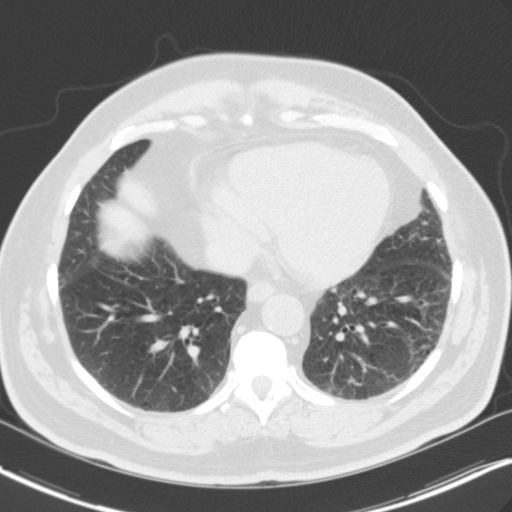
[im 36/97  mediastinal]
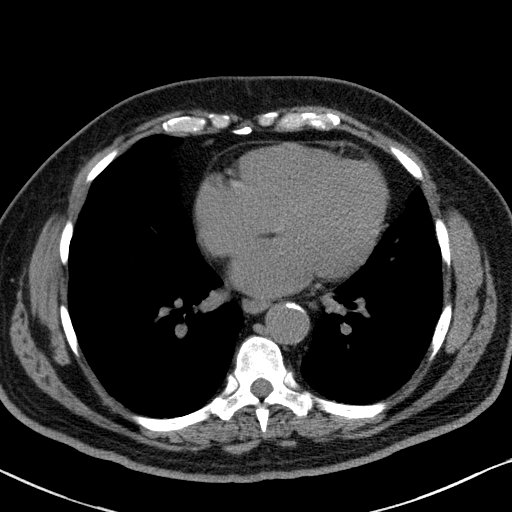
[im 36/97  lung]
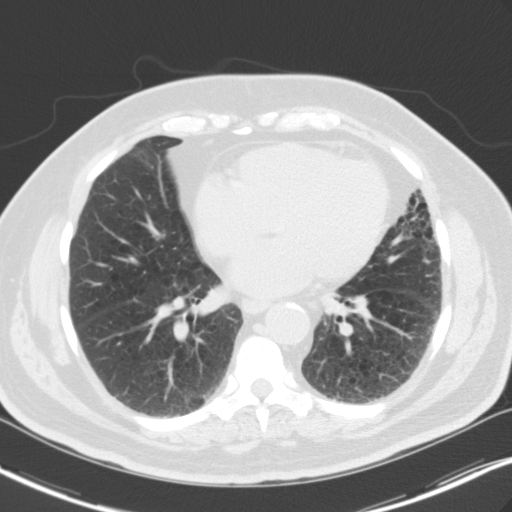
[im 43/97  lung]
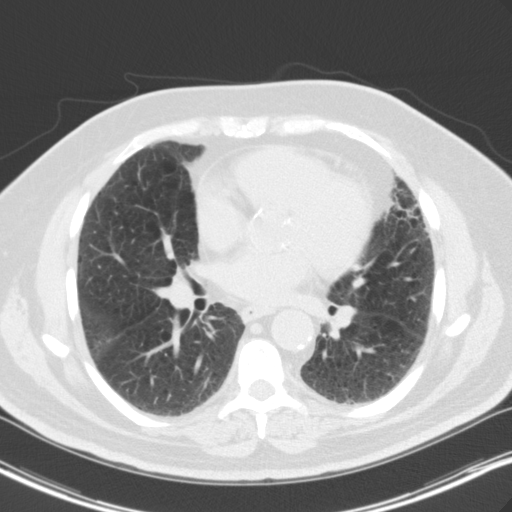
[im 54/97  lung]
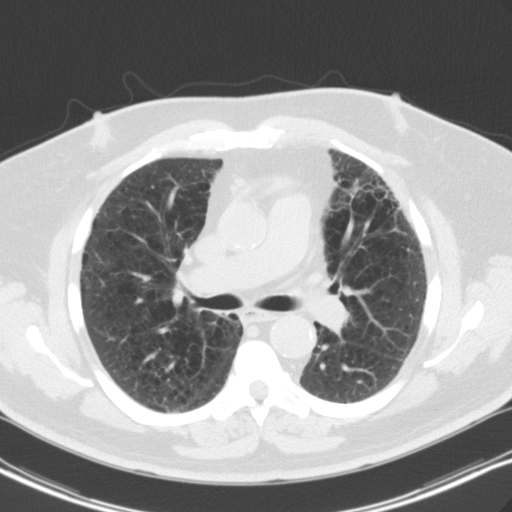
[im 61/97  lung]
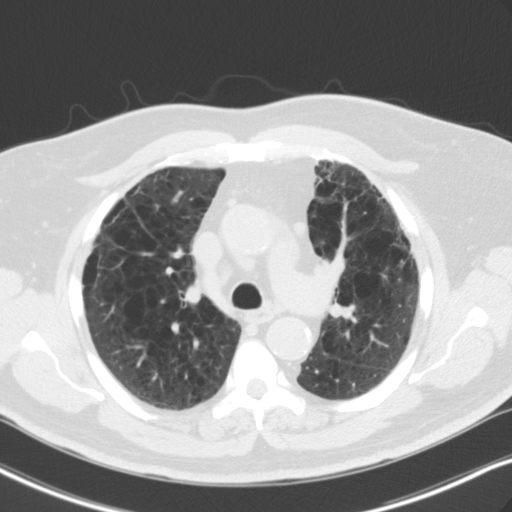
[im 68/97  mediastinal]
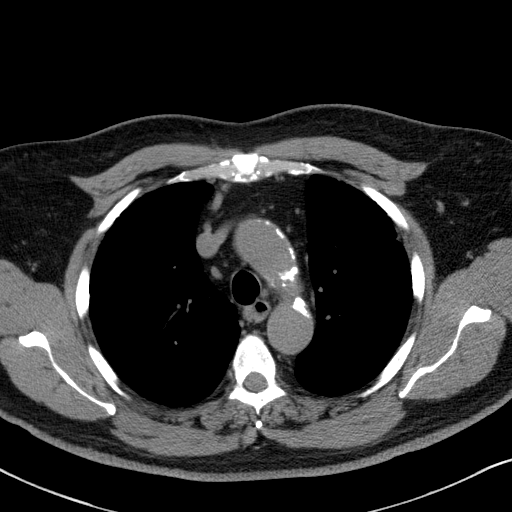
[im 68/97  lung]
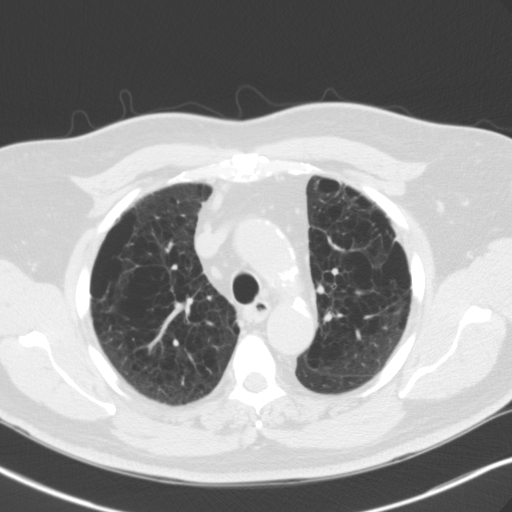
[im 75/97  lung]
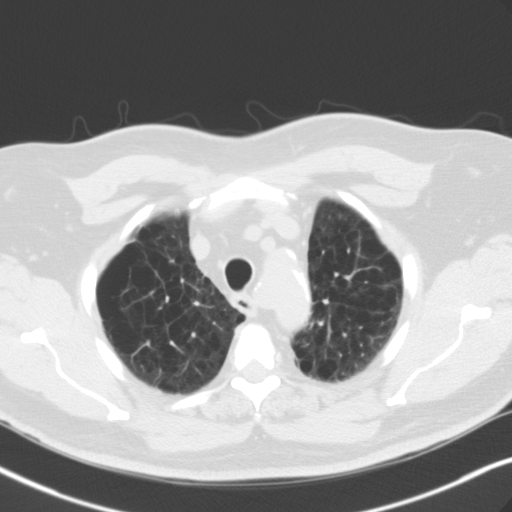
[im 82/97  lung]
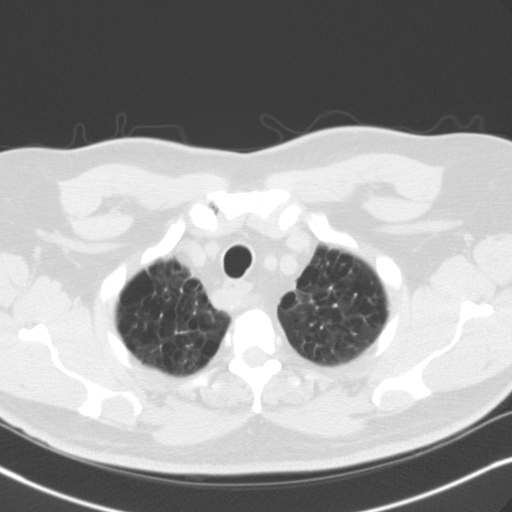
[im 89/97  lung]
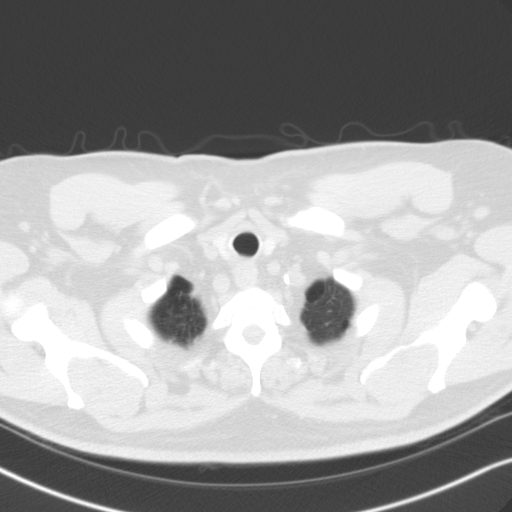

[Series 6: high res std 3.0 coronal · coronal · 0.63mm/px · 3 of 107 slices shown]
[im 22/107  lung]
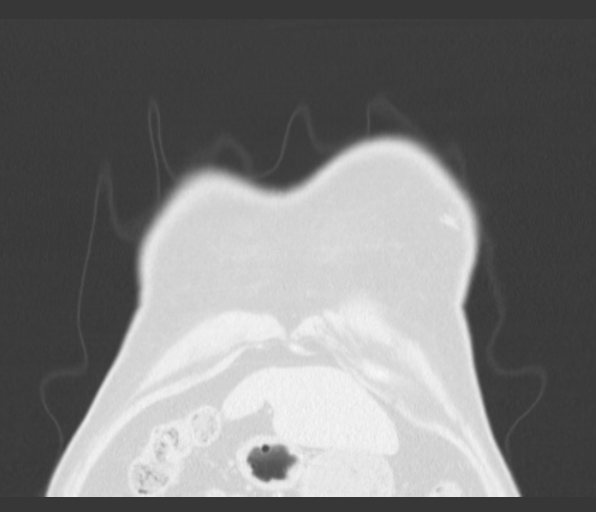
[im 43/107  lung]
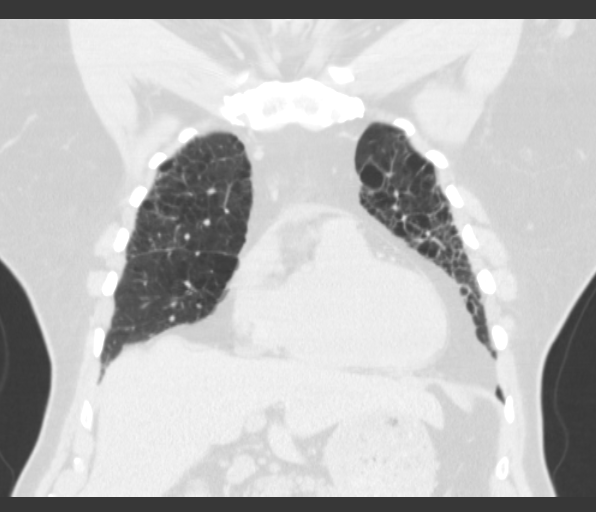
[im 64/107  lung]
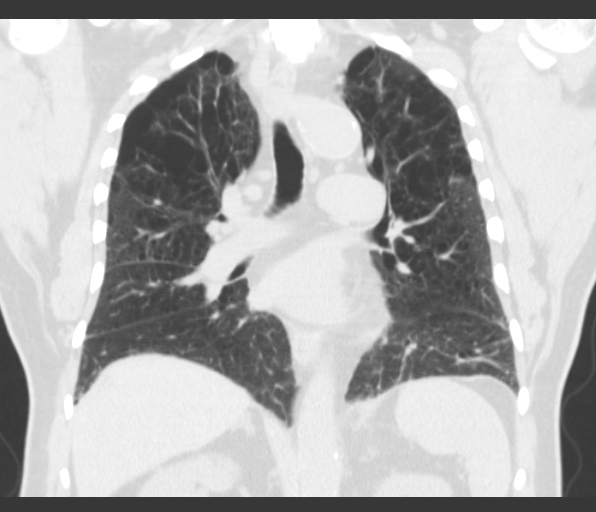

[15 of 36 positions shown; findings below may reference images not displayed]

FINDINGS: Aberrant right subclavian artery passes behind the esophagus.
Atherosclerotic aortic arch and branch vasculature. Coronary artery
atherosclerosis noted.

Scattered small mediastinal lymph nodes are not pathologically
enlarged by size criteria.

Severe centrilobular emphysema noted. Scattered regions of
honeycombing favor the lung bases, particularly the lingula and left
lower lobe. There is mild cylindrical bronchiectasis in portions the
left lower lobe noted.

Inspiratory and expiratory views do not demonstrate mosaic
attenuation or specific findings of the other underlying pathologic
condition.
IMPRESSION: 1. Severe central lobular emphysema with basilar fibrosis and some
honeycombing.
2. Atherosclerosis.
3. Aberrant right subclavian artery passes behind the esophagus,
which can cause dysphagia lusoria.

## 2013-10-13 LAB — HM DIABETES EYE EXAM

## 2013-10-20 ENCOUNTER — Other Ambulatory Visit: Payer: Self-pay | Admitting: Internal Medicine

## 2014-02-16 ENCOUNTER — Ambulatory Visit: Payer: Medicare PPO

## 2014-02-16 ENCOUNTER — Ambulatory Visit (INDEPENDENT_AMBULATORY_CARE_PROVIDER_SITE_OTHER): Payer: Medicare PPO

## 2014-02-16 DIAGNOSIS — Z23 Encounter for immunization: Secondary | ICD-10-CM

## 2014-04-07 ENCOUNTER — Other Ambulatory Visit: Payer: Self-pay | Admitting: Internal Medicine

## 2014-04-10 ENCOUNTER — Telehealth: Payer: Self-pay | Admitting: Internal Medicine

## 2014-04-10 ENCOUNTER — Other Ambulatory Visit: Payer: Self-pay

## 2014-04-10 MED ORDER — AMLODIPINE BESYLATE 10 MG PO TABS: 10.0000 mg | ORAL_TABLET | Freq: Every day | ORAL | Status: DC

## 2014-04-10 NOTE — Telephone Encounter (Signed)
Never received refill request for Pt. Amlodipine refilled to [facility].

## 2014-04-10 NOTE — Telephone Encounter (Signed)
Caller name: Azim Relation to pt: self Call back number: [phone] Pharmacy: [place] fax: [phone] Reason for call:   [facility] is telling patient that they did not receive medication refills from 04/07/14. He is also requesting an amlodipine refill

## 2014-04-21 ENCOUNTER — Ambulatory Visit (INDEPENDENT_AMBULATORY_CARE_PROVIDER_SITE_OTHER): Payer: Medicare PPO | Admitting: Pulmonary Disease

## 2014-04-21 ENCOUNTER — Encounter: Payer: Self-pay | Admitting: Pulmonary Disease

## 2014-04-21 VITALS — BP 122/76 | HR 64 | Temp 98.1°F | Ht 67.5 in | Wt 211.0 lb

## 2014-04-21 DIAGNOSIS — J439 Emphysema, unspecified: Secondary | ICD-10-CM

## 2014-04-21 DIAGNOSIS — J8410 Pulmonary fibrosis, unspecified: Secondary | ICD-10-CM

## 2014-04-21 LAB — PULMONARY FUNCTION TEST
DL/VA % pred: 51 %
DL/VA: 2.27 ml/min/mmHg/L
DLCO unc % pred: 27 %
DLCO unc: 8.1 ml/min/mmHg
FEF 25-75 Post: 1.72 L/sec
FEF 25-75 Pre: 0.72 L/sec
FEF2575-%Change-Post: 137 %
FEF2575-%Pred-Post: 88 %
FEF2575-%Pred-Pre: 37 %
FEV1-%Change-Post: 12 %
FEV1-%Pred-Post: 67 %
FEV1-%Pred-Pre: 60 %
FEV1-Post: 1.65 L
FEV1-Pre: 1.47 L
FEV1FVC-%Change-Post: 0 %
FEV1FVC-%Pred-Pre: 98 %
FEV6-%Change-Post: 12 %
FEV6-%Pred-Post: 70 %
FEV6-%Pred-Pre: 62 %
FEV6-Post: 2.22 L
FEV6-Pre: 1.97 L
FEV6FVC-%Change-Post: 1 %
FEV6FVC-%Pred-Post: 106 %
FEV6FVC-%Pred-Pre: 105 %
FVC-%Change-Post: 11 %
FVC-%Pred-Post: 66 %
FVC-%Pred-Pre: 59 %
FVC-Post: 2.22 L
FVC-Pre: 1.99 L
Post FEV1/FVC ratio: 74 %
Post FEV6/FVC ratio: 100 %
Pre FEV1/FVC ratio: 74 %
Pre FEV6/FVC Ratio: 99 %
RV % pred: 81 %
RV: 2 L
TLC % pred: 64 %
TLC: 4.21 L

## 2014-04-21 MED ORDER — TIOTROPIUM BROMIDE MONOHYDRATE 2.5 MCG/ACT IN AERS: 2.0000 | INHALATION_SPRAY | RESPIRATORY_TRACT | Status: DC

## 2014-04-21 NOTE — Progress Notes (Signed)
PFT done today. 

## 2014-04-21 NOTE — Assessment & Plan Note (Signed)
I do think that his severely low DLCO is due to combination of emphysema and fibrosis. He may also have a component of pulmonary hypertension-but I will not investigate this further. He remains suspicious of side effects of medications I was finally able to convince him to try Spiriva Trial of spiriva rspimat 2 puffs daily- let us know if this works

## 2014-04-21 NOTE — Progress Notes (Signed)
   Subjective:    Patient ID: Bradley Johns, male    DOB: [birth year], 80 y.o.   MRN: [id number]  HPI  PCP- Larose Kells   80 year old remote smoker for FU of emphysema and interstitial lung disease.    He smoked about a pack per day until he quit in 1998. 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   He was diagnosed with obstructive sleep apnea but is not compliant with CPAP   Significant tests/ events  Chest x-ray from 10/11/12 when compared to 2006 showed hyperinflation and bibasal interstitial markings that were progressive from prior.  PFTs 12/2012- no airway obstruction with FEV1 of 67% and FVC of 65% post bronchodilator with 12 percent bronchodilator response. There was severe intraparenchymal restriction with TLC of 60% and DLCO of 32%.  HRCT- 12/2012 -Severe central lobular emphysema with basilar fibrosis and some honeycombing.  ESR 6, ANA, RA factor, CCP neg   09/2013 Completed rehab - O2 to continuous 3-6 L/M during exercise 6MW improved 25% from 1320 ft to 1650 ft  PFTs 04/2014 >> unchanged, FEV1 60%, FVC 59%, 12% bronchodilator response, TLC 64%, DLCO slight drop to 27%  04/21/2014  Chief Complaint  Patient presents with  . Follow-up    PFT today.  Using concentrator a lot.  Uses 6L when exercising at [facility].  Using 3L pulse most of the time.  Wants samples of Ventolin.     He remains poorly compliant with O2, just uses it as needed, on occasion his oxygen saturations dropped to 70-80%. He remains resistant to starting medications and very suspicious of side effects  Albuterol MDI helps some  Cough persists, especially after he eats  Past Medical History  Diagnosis Date  . COPD (chronic obstructive pulmonary disease)   . Diabetes mellitus     2  . Gout   . Hypertension   . Hyperlipidemia   . Sleep apnea   . Arthritis   . Benign prostatic hypertrophy   . Pulmonary  fibrosis 2014      Review of Systems neg for any significant sore throat, dysphagia, itching, sneezing, nasal congestion or excess/ purulent secretions, fever, chills, sweats, unintended wt loss, pleuritic or exertional cp, hempoptysis, orthopnea pnd or change in chronic leg swelling. Also denies presyncope, palpitations, heartburn, abdominal pain, nausea, vomiting, diarrhea or change in bowel or urinary habits, dysuria,hematuria, rash, arthralgias, visual complaints, headache, numbness weakness or ataxia.     Objective:   Physical Exam  Gen. Pleasant, obese, in no distress, normal affect ENT - no lesions, no post nasal drip, class 2-3 airway Neck: No JVD, no thyromegaly, no carotid bruits Lungs: no use of accessory muscles, no dullness to percussion, bibasal rales no rhonchi  Cardiovascular: Rhythm regular, heart sounds  normal, no murmurs or gallops, 2+ peripheral edema Abdomen: soft and non-tender, no hepatosplenomegaly, BS normal. Musculoskeletal: No deformities, no cyanosis or clubbing Neuro:  alert, non focal, no tremors        Assessment & Plan:

## 2014-04-21 NOTE — Assessment & Plan Note (Signed)
We discussed medications for fibrosis - PERFENIDONE & OFEV - & side effects  Let me know if willing & we will proceed with Ct scan  I did emphasize compliance with oxygen.

## 2014-04-21 NOTE — Patient Instructions (Signed)
Trial of spiriva rspimat 2 puffs daily- let us know if this works We discussed medications for fibrosis - PERFENIDONE & OFEV - & side effects  Let me know if willing & we will proceed with Ct scan

## 2014-06-12 ENCOUNTER — Encounter: Payer: Self-pay | Admitting: *Deleted

## 2014-06-12 ENCOUNTER — Telehealth: Payer: Self-pay | Admitting: *Deleted

## 2014-06-12 NOTE — Telephone Encounter (Signed)
Pre-Visit Call completed with patient and chart updated.   Pre-Visit Info documented in Specialty Comments under [employer].    

## 2014-06-14 ENCOUNTER — Encounter: Payer: Self-pay | Admitting: Internal Medicine

## 2014-06-14 ENCOUNTER — Ambulatory Visit (INDEPENDENT_AMBULATORY_CARE_PROVIDER_SITE_OTHER): Payer: Medicare PPO | Admitting: Internal Medicine

## 2014-06-14 VITALS — BP 124/64 | HR 71 | Temp 97.8°F | Ht 68.0 in | Wt 213.0 lb

## 2014-06-14 DIAGNOSIS — I10 Essential (primary) hypertension: Secondary | ICD-10-CM | POA: Diagnosis not present

## 2014-06-14 DIAGNOSIS — E785 Hyperlipidemia, unspecified: Secondary | ICD-10-CM | POA: Diagnosis not present

## 2014-06-14 DIAGNOSIS — N400 Enlarged prostate without lower urinary tract symptoms: Secondary | ICD-10-CM | POA: Diagnosis not present

## 2014-06-14 DIAGNOSIS — Z0000 Encounter for general adult medical examination without abnormal findings: Secondary | ICD-10-CM | POA: Diagnosis not present

## 2014-06-14 DIAGNOSIS — M150 Primary generalized (osteo)arthritis: Secondary | ICD-10-CM | POA: Diagnosis not present

## 2014-06-14 DIAGNOSIS — M159 Polyosteoarthritis, unspecified: Secondary | ICD-10-CM

## 2014-06-14 DIAGNOSIS — M8949 Other hypertrophic osteoarthropathy, multiple sites: Secondary | ICD-10-CM

## 2014-06-14 DIAGNOSIS — J439 Emphysema, unspecified: Secondary | ICD-10-CM

## 2014-06-14 LAB — CBC WITH DIFFERENTIAL/PLATELET
Basophils Absolute: 0 10*3/uL (ref 0.0–0.1)
Basophils Relative: 0.3 % (ref 0.0–3.0)
Eosinophils Absolute: 0.2 10*3/uL (ref 0.0–0.7)
Eosinophils Relative: 2.6 % (ref 0.0–5.0)
HCT: 42 % (ref 39.0–52.0)
Hemoglobin: 14.3 g/dL (ref 13.0–17.0)
Lymphocytes Relative: 25.5 % (ref 12.0–46.0)
Lymphs Abs: 1.7 10*3/uL (ref 0.7–4.0)
MCHC: 33.9 g/dL (ref 30.0–36.0)
MCV: 92 fl (ref 78.0–100.0)
Monocytes Absolute: 0.7 10*3/uL (ref 0.1–1.0)
Monocytes Relative: 10.4 % (ref 3.0–12.0)
Neutro Abs: 4.2 10*3/uL (ref 1.4–7.7)
Neutrophils Relative %: 61.2 % (ref 43.0–77.0)
Platelets: 169 10*3/uL (ref 150.0–400.0)
RBC: 4.57 Mil/uL (ref 4.22–5.81)
RDW: 15.2 % (ref 11.5–15.5)
WBC: 6.9 10*3/uL (ref 4.0–10.5)

## 2014-06-14 LAB — LIPID PANEL
Cholesterol: 131 mg/dL (ref 0–200)
HDL: 47.9 mg/dL (ref >39.00)
LDL Cholesterol: 68 mg/dL (ref 0–99)
NonHDL: 83.1
Total CHOL/HDL Ratio: 3
Triglycerides: 76 mg/dL (ref 0.0–149.0)
VLDL: 15.2 mg/dL (ref 0.0–40.0)

## 2014-06-14 LAB — BASIC METABOLIC PANEL
BUN: 15 mg/dL (ref 6–23)
CO2: 32 mEq/L (ref 19–32)
Calcium: 9.7 mg/dL (ref 8.4–10.5)
Chloride: 102 mEq/L (ref 96–112)
Creatinine, Ser: 1.17 mg/dL (ref 0.40–1.50)
GFR: 77.6 mL/min (ref >60.00)
Glucose, Bld: 94 mg/dL (ref 70–99)
Potassium: 4.3 mEq/L (ref 3.5–5.1)
Sodium: 138 mEq/L (ref 135–145)

## 2014-06-14 LAB — ALT: ALT: 18 U/L (ref 0–53)

## 2014-06-14 LAB — AST: AST: 28 U/L (ref 0–37)

## 2014-06-14 MED ORDER — ZOSTER VACCINE LIVE 19400 UNT/0.65ML ~~LOC~~ SOLR: 0.6500 mL | Freq: Once | SUBCUTANEOUS | Status: DC

## 2014-06-14 NOTE — Assessment & Plan Note (Addendum)
Well-controlled on amlodipine, carvedilol, Cardura, losartan. Does have some edema, not bothersome to the pt. Plan: Continue same care, reassess in 4 months, DC amlodipine?. Check a BMP and CBC

## 2014-06-14 NOTE — Progress Notes (Signed)
Subjective:    Patient ID: Bradley Johns, male    DOB: June 15, 1934, 80 y.o.   MRN: [id number]  DOS:  06/14/2014 Type of visit - description :   Here for Medicare AWV: 1.Risk factors based on Past M, S, F history:reviewed  2.Physical Activities: remains active at home, no routine exercise 3.Depression/mood: (-) screening  4.Hearing: no complaints, no problems noted  5.ADL's: totally independent, drives , avoiding night drive d/t visionproblems  6.Fall Risk: low risk, no h/o falls , counseled  7.Home Safety: does feel safe at home  8.Height, weight, &visual acuity: see VS, vision corrected, developing problems, see above, next visit due July 2016, encouraged   to go earlier  9. Counseling: yes , see below  10.Labs ordered based on risk factors: yes  11.Referral Coordination: if needed  12.Care Plan: see a/p , written instructions provided  13. Cognitive Assessment , motor skills, cognition and memory seem appropriate 14. Care team updated 15. End of life care discussed , does not desire heroic measures, already discussed with the family, encouraged to put that in writing, package of information provided  In addition, we discussed the following issues Diabetes, good compliance with medication, follow-up by Dr. Posey Pronto Hypertension, good compliance of medication, BP today is very good, reports good ambulatory BPs, has LE  swelling on and off, not bothersome to him DJD, takes hydrocodone very rarely History of COPD, on Spiriva, symptoms are stable. BPH, some nocturia, taking Cardura. Gout, no recent episodes, on colchicine as needed   Review of Systems Constitutional: No fever, chills. No unexplained wt changes. No unusual sweats HEENT: No dental problems, ear discharge, facial swelling, voice changes. Mild bilateral eye tearing, no redness or intolerance to light Respiratory:   + Cough on and off, , some dyspnea on exertion (already discussed with  pulmonary) , no hemoptysis. Symptoms are stable Cardiovascular: No CP, + leg swelling, no palpitations GI: no nausea, vomiting, diarrhea or abdominal pain.  No blood in the stools. No dysphagia   Endocrine: No polyphagia, polyuria or polydipsia GU: Continue with nocturia but denies blood in the urine, + mild urgency. No dysuria per se or difficulty urinating Musculoskeletal: No joint swellings or unusual aches or pains Skin: + Hyperpigmentation on the pretibial area. Allergic, immunologic: No environmental allergies or food allergies Neurological: No dizziness or syncope. No headaches. No diplopia, slurred speech, motor deficits, facial numbness Hematological: No enlarged lymph nodes, easy bruising or bleeding Psychiatry: No suicidal ideas, hallucinations, behavior problems or confusion. No unusual/severe anxiety or depression.     Past Medical History  Diagnosis Date  . COPD (chronic obstructive pulmonary disease)     24/7 O2  . Diabetes mellitus     per Dr Posey Pronto  . Gout   . Hypertension   . Hyperlipidemia   . Sleep apnea   . Arthritis   . Benign prostatic hypertrophy   . Pulmonary fibrosis 2014    Past Surgical History  Procedure Laterality Date  . Toe surgery      History   Social History  . Marital Status: Married    Spouse Name: N/A  . Number of Children: 2  . Years of Education: N/A   Occupational History  . retired    Social History Main Topics  . Smoking status: Former Smoker -- 1.00 packs/day for 30 years    Types: Cigarettes    Quit date: 04/07/1996  . Smokeless tobacco: Never Used  . Alcohol Use: Yes     Comment:  occassional beer  . Drug Use: No  . Sexual Activity: Not on file   Other Topics Concern  . Not on file   Social History Narrative   Lives w/ wife     Family History  Problem Relation Age of Onset  . Liver disease Mother   . Coronary artery disease Father 43  . Diabetes Sister   . Cancer Brother     type?  Marland Kitchen Hypertension Neg Hx     . Hyperlipidemia Neg Hx   . Heart attack Neg Hx   . Prostate cancer Neg Hx   . Colon cancer Neg Hx        Medication List       This list is accurate as of: 06/14/14 11:59 PM.  Always use your most recent med list.               albuterol 108 (90 BASE) MCG/ACT inhaler  Commonly known as:  VENTOLIN HFA  Inhale 2 puffs into the lungs every 6 (six) hours as needed for wheezing.     amLODipine 10 MG tablet  Commonly known as:  NORVASC  Take 1 tablet (10 mg total) by mouth daily.     aspirin 81 MG tablet  Take 81 mg by mouth daily.     atorvastatin 80 MG tablet  Commonly known as:  LIPITOR  Take 1 tablet (80 mg total) by mouth daily.     B-D ULTRAFINE III SHORT PEN 31G X 8 MM Misc  Generic drug:  Insulin Pen Needle     carvedilol 12.5 MG tablet  Commonly known as:  COREG  Take 1 tablet (12.5 mg total) by mouth 2 (two) times daily with a meal.     colchicine 0.6 MG tablet  Take 0.6 mg by mouth daily as needed.     doxazosin 4 MG tablet  Commonly known as:  CARDURA  TAKE 1 TABLET AT BEDTIME     HUMALOG MIX 75/25 PEN   Inject 30 Units into the skin daily.     HYDROcodone-acetaminophen 5-500 MG per tablet  Commonly known as:  VICODIN  Take 1 tablet by mouth every 6 (six) hours as needed.     losartan 50 MG tablet  Commonly known as:  COZAAR  TAKE 1 TABLET EVERY DAY     metFORMIN 500 MG tablet  Commonly known as:  GLUCOPHAGE  Take 500 mg by mouth 2 (two) times daily with a meal.     multivitamin tablet  Take 1 tablet by mouth daily.     pioglitazone 45 MG tablet  Commonly known as:  ACTOS  Take 45 mg by mouth daily.     Tiotropium Bromide Monohydrate 2.5 MCG/ACT Aers  Commonly known as:  SPIRIVA RESPIMAT  Inhale 2 puffs into the lungs every morning.     zoster vaccine live (PF) 19400 UNT/0.65ML injection  Commonly known as:  ZOSTAVAX  Inject 19,400 Units into the skin once.           Objective:   Physical Exam BP 124/64 mmHg  Pulse 71   Temp(Src) 97.8 F (36.6 C) (Oral)  Ht 5\' 8"  (1.727 m)  Wt 213 lb (96.616 kg)  BMI 32.39 kg/m2  SpO2 89% General:   Well developed, well nourished . NAD.  Neck:  Full range of motion. Supple. No  thyromegaly , normal carotid pulse HEENT:  Normocephalic . Face symmetric, atraumatic Lungs:  Chris breath sounds otherwise clear Normal respiratory effort, no intercostal retractions,  no accessory muscle use. Heart: RRR,  no murmur.  Abdomen:  Not distended, soft, non-tender. No rebound or rigidity. No mass,organomegaly Muscle skeletal: +/+++ pretibial edema bilaterally, + skin hyperpigmentation w/o actual lesions   Skin: Exposed areas without rash. Not pale. Not jaundice Rectal:  External abnormalities: none. Normal sphincter tone. No rectal masses or tenderness.  Stool brown  Prostate: Prostate gland firm and smooth, no enlargement, nodularity, tenderness, mass, asymmetry or induration.  Neurologic:  alert & oriented X3.  Speech normal, gait appropriate for age and unassisted Strength symmetric and appropriate for age.  Psych: Cognition and judgment appear intact.  Cooperative with normal attention span and concentration.  Behavior appropriate. No anxious or depressed appearing.        Assessment & Plan:

## 2014-06-14 NOTE — Assessment & Plan Note (Signed)
DRE show no nodularity today, PSAs stable for years. Has nocturia, offer a urology referral if so desired

## 2014-06-14 NOTE — Assessment & Plan Note (Signed)
Takes hydrocodone very rarely

## 2014-06-14 NOTE — Assessment & Plan Note (Signed)
On Lipitor, check labs.

## 2014-06-14 NOTE — Patient Instructions (Signed)
Get your blood work before you leave    Come back to the office in 4 months   for a routine check up        Fall Prevention and Home Safety Falls cause injuries and can affect all age groups. It is possible to use preventive measures to significantly decrease the likelihood of falls. There are many simple measures which can make your home safer and prevent falls. OUTDOORS  Repair cracks and edges of walkways and driveways.  Remove high doorway thresholds.  Trim shrubbery on the main path into your home.  Have good outside lighting.  Clear walkways of tools, rocks, debris, and clutter.  Check that handrails are not broken and are securely fastened. Both sides of steps should have handrails.  Have leaves, snow, and ice cleared regularly.  Use sand or salt on walkways during winter months.  In the garage, clean up grease or oil spills. BATHROOM  Install night lights.  Install grab bars by the toilet and in the tub and shower.  Use non-skid mats or decals in the tub or shower.  Place a plastic non-slip stool in the shower to sit on, if needed.  Keep floors dry and clean up all water on the floor immediately.  Remove soap buildup in the tub or shower on a regular basis.  Secure bath mats with non-slip, double-sided rug tape.  Remove throw rugs and tripping hazards from the floors. BEDROOMS  Install night lights.  Make sure a bedside light is easy to reach.  Do not use oversized bedding.  Keep a telephone by your bedside.  Have a firm chair with side arms to use for getting dressed.  Remove throw rugs and tripping hazards from the floor. KITCHEN  Keep handles on pots and pans turned toward the center of the stove. Use back burners when possible.  Clean up spills quickly and allow time for drying.  Avoid walking on wet floors.  Avoid hot utensils and knives.  Position shelves so they are not too high or low.  Place commonly used objects within easy  reach.  If necessary, use a sturdy step stool with a grab bar when reaching.  Keep electrical cables out of the way.  Do not use floor polish or wax that makes floors slippery. If you must use wax, use non-skid floor wax.  Remove throw rugs and tripping hazards from the floor. STAIRWAYS  Never leave objects on stairs.  Place handrails on both sides of stairways and use them. Fix any loose handrails. Make sure handrails on both sides of the stairways are as long as the stairs.  Check carpeting to make sure it is firmly attached along stairs. Make repairs to worn or loose carpet promptly.  Avoid placing throw rugs at the top or bottom of stairways, or properly secure the rug with carpet tape to prevent slippage. Get rid of throw rugs, if possible.  Have an electrician put in a light switch at the top and bottom of the stairs. OTHER FALL PREVENTION TIPS  Wear low-heel or rubber-soled shoes that are supportive and fit well. Wear closed toe shoes.  When using a stepladder, make sure it is fully opened and both spreaders are firmly locked. Do not climb a closed stepladder.  Add color or contrast paint or tape to grab bars and handrails in your home. Place contrasting color strips on first and last steps.  Learn and use mobility aids as needed. Install an electrical emergency response system.    Turn on lights to avoid dark areas. Replace light bulbs that burn out immediately. Get light switches that glow.  Arrange furniture to create clear pathways. Keep furniture in the same place.  Firmly attach carpet with non-skid or double-sided tape.  Eliminate uneven floor surfaces.  Select a carpet pattern that does not visually hide the edge of steps.  Be aware of all pets. OTHER HOME SAFETY TIPS  Set the water temperature for 120 F (48.8 C).  Keep emergency numbers on or near the telephone.  Keep smoke detectors on every level of the home and near sleeping areas. Document Released:  03/14/2002 Document Revised: 09/23/2011 Document Reviewed: 06/13/2011 ExitCare Patient Information 2015 [employer]. This information is not intended to replace advice given to you by your health care provider. Make sure you discuss any questions you have with your health care provider.   Preventive Care for Adults Ages 65 and over  Blood pressure check.** / Every 1 to 2 years.  Lipid and cholesterol check.**/ Every 5 years beginning at age 20.  Lung cancer screening. / Every year if you are aged 55-80 years and have a 30-pack-year history of smoking and currently smoke or have quit within the past 15 years. Yearly screening is stopped once you have quit smoking for at least 15 years or develop a health problem that would prevent you from having lung cancer treatment.  Fecal occult blood test (FOBT) of stool. / Every year beginning at age 50 and continuing until age 75. You may not have to do this test if you get a colonoscopy every 10 years.  Flexible sigmoidoscopy** or colonoscopy.** / Every 5 years for a flexible sigmoidoscopy or every 10 years for a colonoscopy beginning at age 50 and continuing until age 75.  Hepatitis C blood test.** / For all people born from 1945 through 1965 and any individual with known risks for hepatitis C.  Abdominal aortic aneurysm (AAA) screening.** / A one-time screening for ages 65 to 75 years who are current or former smokers.  Skin self-exam. / Monthly.  Influenza vaccine. / Every year.  Tetanus, diphtheria, and acellular pertussis (Tdap/Td) vaccine.** / 1 dose of Td every 10 years.  Varicella vaccine.** / Consult your health care provider.  Zoster vaccine.** / 1 dose for adults aged 60 years or older.  Pneumococcal 13-valent conjugate (PCV13) vaccine.** / Consult your health care provider.  Pneumococcal polysaccharide (PPSV23) vaccine.** / 1 dose for all adults aged 65 years and older.  Meningococcal vaccine.** / Consult your health care  provider.  Hepatitis A vaccine.** / Consult your health care provider.  Hepatitis B vaccine.** / Consult your health care provider.  Haemophilus influenzae type b (Hib) vaccine.** / Consult your health care provider. **Family history and personal history of risk and conditions may change your health care provider's recommendations. Document Released: 05/20/2001 Document Revised: 03/29/2013 Document Reviewed: 08/19/2010 ExitCare Patient Information 2015 [employer]. This information is not intended to replace advice given to you by your health care provider. Make sure you discuss any questions you have with your health care provider.   

## 2014-06-14 NOTE — Assessment & Plan Note (Addendum)
Symptoms relatively stable on Spiriva and albuterol; on  as needed O2 , sat today low, was not using his supplemental oxygen

## 2014-06-14 NOTE — Assessment & Plan Note (Addendum)
Td   2011 pneumonia shot 2011    prevnar provided 06-15-13 (documented as PNM 23) shingles shot, discussed before , new rx provided today   had a Cscope w/ Drr Collene Mares before, again Cscope 4-11 (had a + hemocult), bx ----> tubular adenoma, next Cscope -- will call GI Addendum: Report reviewed (it was in the chart, I couldn't  find it before) had a colonoscopy 07/25/2009, poor prep, biopsies were done. GI recommended six-month follow-up colonoscopy with a five-day prep. Pathology report not available. At this point given age and comorbidities, I will talk to the patient when he comes back to see if he likes to proceed with another colonoscopy noting  he will need a five-day prep.   Diet-exercise discussed

## 2014-06-14 NOTE — Progress Notes (Signed)
Pre visit review using our clinic review tool, if applicable. No additional management support is needed unless otherwise documented below in the visit note. 

## 2014-06-16 NOTE — Addendum Note (Signed)
Addended by: Kathlene November E on: 06/16/2014 01:09 PM   Modules accepted: Miquel Dunn

## 2014-06-16 NOTE — Addendum Note (Signed)
Addended by: Kathlene November E on: 06/16/2014 01:01 PM   Modules accepted: Miquel Dunn

## 2014-07-04 ENCOUNTER — Encounter: Payer: Self-pay | Admitting: Internal Medicine

## 2014-07-04 ENCOUNTER — Ambulatory Visit (INDEPENDENT_AMBULATORY_CARE_PROVIDER_SITE_OTHER): Payer: Medicare PPO | Admitting: Internal Medicine

## 2014-07-04 VITALS — BP 126/68 | HR 72 | Temp 97.3°F | Ht 68.0 in | Wt 208.0 lb

## 2014-07-04 DIAGNOSIS — J439 Emphysema, unspecified: Secondary | ICD-10-CM

## 2014-07-04 NOTE — Progress Notes (Deleted)
Subjective:    Patient ID: Bradley Johns, male    DOB: Jul 13, 1934, 80 y.o.   MRN: [id number]  DOS:  07/04/2014 Type of visit - description :  Interval history:    Review of Systems   Past Medical History  Diagnosis Date  . COPD (chronic obstructive pulmonary disease)     24/7 O2  . Diabetes mellitus     per Dr Posey Pronto  . Gout   . Hypertension   . Hyperlipidemia   . Sleep apnea   . Arthritis   . Benign prostatic hypertrophy   . Pulmonary fibrosis 2014    Past Surgical History  Procedure Laterality Date  . Toe surgery      History   Social History  . Marital Status: Married    Spouse Name: N/A  . Number of Children: 2  . Years of Education: N/A   Occupational History  . retired    Social History Main Topics  . Smoking status: Former Smoker -- 1.00 packs/day for 30 years    Types: Cigarettes    Quit date: 04/07/1996  . Smokeless tobacco: Never Used  . Alcohol Use: Yes     Comment: occassional beer  . Drug Use: No  . Sexual Activity: Not on file   Other Topics Concern  . Not on file   Social History Narrative   Lives w/ wife        Medication List       This list is accurate as of: 07/04/14 10:58 AM.  Always use your most recent med list.               albuterol 108 (90 BASE) MCG/ACT inhaler  Commonly known as:  VENTOLIN HFA  Inhale 2 puffs into the lungs every 6 (six) hours as needed for wheezing.     amLODipine 10 MG tablet  Commonly known as:  NORVASC  Take 1 tablet (10 mg total) by mouth daily.     aspirin 81 MG tablet  Take 81 mg by mouth daily.     atorvastatin 80 MG tablet  Commonly known as:  LIPITOR  Take 1 tablet (80 mg total) by mouth daily.     B-D ULTRAFINE III SHORT PEN 31G X 8 MM Misc  Generic drug:  Insulin Pen Needle     carvedilol 12.5 MG tablet  Commonly known as:  COREG  Take 1 tablet (12.5 mg total) by mouth 2 (two) times daily with a meal.     colchicine 0.6 MG tablet  Take 0.6 mg by mouth daily as needed.      doxazosin 4 MG tablet  Commonly known as:  CARDURA  TAKE 1 TABLET AT BEDTIME     HUMALOG MIX 75/25 PEN [place]  Inject 30 Units into the skin daily.     HYDROcodone-acetaminophen 5-500 MG per tablet  Commonly known as:  VICODIN  Take 1 tablet by mouth every 6 (six) hours as needed.     losartan 50 MG tablet  Commonly known as:  COZAAR  TAKE 1 TABLET EVERY DAY     metFORMIN 500 MG tablet  Commonly known as:  GLUCOPHAGE  Take 500 mg by mouth 2 (two) times daily with a meal.     multivitamin tablet  Take 1 tablet by mouth daily.     pioglitazone 45 MG tablet  Commonly known as:  ACTOS  Take 45 mg by mouth daily.     Tiotropium Bromide Monohydrate 2.5 MCG/ACT Aers  Commonly known as:  SPIRIVA RESPIMAT  Inhale 2 puffs into the lungs every morning.     zoster vaccine live (PF) 19400 UNT/0.65ML injection  Commonly known as:  ZOSTAVAX  Inject 19,400 Units into the skin once.           Objective:   Physical Exam BP 126/68 mmHg  Pulse 72  Temp(Src) 97.3 F (36.3 C) (Oral)  Ht 5\' 8"  (1.727 m)  Wt 208 lb (94.348 kg)  BMI 31.63 kg/m2  SpO2 83%       Assessment & Plan:

## 2014-07-04 NOTE — Patient Instructions (Signed)
Rest, fluids , tylenol Continue w/ OTC Tussionex as needed  If nasal  congestion use OTC Nasocort or Flonase : 2 nasal sprays on each side of the nose daily until you feel better Use hydrocodone at nightime if you have severe cough Use albuterol as needed for persistent cough Call if not gradually better over the next  10 days Call anytime if the symptoms are severe

## 2014-07-04 NOTE — Progress Notes (Signed)
Pre visit review using our clinic review tool, if applicable. No additional management support is needed unless otherwise documented below in the visit note. 

## 2014-07-04 NOTE — Progress Notes (Signed)
Subjective:    Patient ID: Bradley Johns, male    DOB: [birth year], 80 y.o.   MRN: [id number]  DOS:  07/04/2014 Type of visit - description : acute visit Acute visit, [name] presents with a 4 day history of congestion and dry, nonproductive cough, which is worse at night. Also mentions throat pain with mild odynophagia. This is affecting his sleeping. Taking OTC guaifenesin which helps alleviate the cough.   The patient arrived without his oxygen with SPO2 of 81, which increased to 92 with administration of oxygen.     Review of Systems  No fever, chills Some runny nose and sore throat. No nasal discharge. No  CP, mild SOB related to COPD and at baseline denies nausea, vomiting diarrhea, blood in the stools No dysphagia   No sputum production, wheezing, chest congestion Increased urinary frequency (chronic issue) No myalgias, arthralgia      Past Medical History  Diagnosis Date  . COPD (chronic obstructive pulmonary disease)     24/7 O2  . Diabetes mellitus     per Dr Posey Pronto  . Gout   . Hypertension   . Hyperlipidemia   . Sleep apnea   . Arthritis   . Benign prostatic hypertrophy   . Pulmonary fibrosis 2014    Past Surgical History  Procedure Laterality Date  . Toe surgery      History   Social History  . Marital Status: Married    Spouse Name: N/A  . Number of Children: 2  . Years of Education: N/A   Occupational History  . retired    Social History Main Topics  . Smoking status: Former Smoker -- 1.00 packs/day for 30 years    Types: Cigarettes    Quit date: 04/07/1996  . Smokeless tobacco: Never Used  . Alcohol Use: Yes     Comment: occassional beer  . Drug Use: No  . Sexual Activity: Not on file   Other Topics Concern  . Not on file   Social History Narrative   Lives w/ wife        Medication List       This list is accurate as of: 07/04/14 11:23 AM.  Always use your most recent med list.               albuterol 108 (90 BASE)  MCG/ACT inhaler  Commonly known as:  VENTOLIN HFA  Inhale 2 puffs into the lungs every 6 (six) hours as needed for wheezing.     amLODipine 10 MG tablet  Commonly known as:  NORVASC  Take 1 tablet (10 mg total) by mouth daily.     aspirin 81 MG tablet  Take 81 mg by mouth daily.     atorvastatin 80 MG tablet  Commonly known as:  LIPITOR  Take 1 tablet (80 mg total) by mouth daily.     B-D ULTRAFINE III SHORT PEN 31G X 8 MM Misc  Generic drug:  Insulin Pen Needle     carvedilol 12.5 MG tablet  Commonly known as:  COREG  Take 1 tablet (12.5 mg total) by mouth 2 (two) times daily with a meal.     colchicine 0.6 MG tablet  Take 0.6 mg by mouth daily as needed.     doxazosin 4 MG tablet  Commonly known as:  CARDURA  TAKE 1 TABLET AT BEDTIME     HUMALOG MIX 75/25 PEN [place]  Inject 30 Units into the skin daily.  HYDROcodone-acetaminophen 5-500 MG per tablet  Commonly known as:  VICODIN  Take 1 tablet by mouth every 6 (six) hours as needed.     losartan 50 MG tablet  Commonly known as:  COZAAR  TAKE 1 TABLET EVERY DAY     metFORMIN 500 MG tablet  Commonly known as:  GLUCOPHAGE  Take 500 mg by mouth 2 (two) times daily with a meal.     multivitamin tablet  Take 1 tablet by mouth daily.     pioglitazone 45 MG tablet  Commonly known as:  ACTOS  Take 45 mg by mouth daily.     Tiotropium Bromide Monohydrate 2.5 MCG/ACT Aers  Commonly known as:  SPIRIVA RESPIMAT  Inhale 2 puffs into the lungs every morning.     zoster vaccine live (PF) 19400 UNT/0.65ML injection  Commonly known as:  ZOSTAVAX  Inject 19,400 Units into the skin once.           Objective:   Physical Exam BP 126/68 mmHg  Pulse 72  Temp(Src) 97.3 F (36.3 C) (Oral)  Ht 5\' 8"  (1.727 m)  Wt 208 lb (94.348 kg)  BMI 31.63 kg/m2  SpO2 83%  General:   Well developed, well nourished . NAD.  HEENT:  Normocephalic . Face symmetric, atraumatic Lungs:  Decreased BS, no crackles, slt increase exp time  w/o wheezing Normal inspiratory effort, increased expiratory effort. no intercostal retractions, no accessory muscle use. Heart: RRR,  no murmur.  Muscle skeletal: no pretibial edema bilaterally  Skin: Not pale. Not jaundice Neurologic:  alert & oriented X3.  Speech normal, gait appropriate for age and unassisted Psych--  Cognition and judgment appear intact.  Cooperative with normal attention span and concentration.  Behavior appropriate. No anxious or depressed appearing.        Assessment & Plan:    COPD exhacerbation 4 days of cough and congestion with sore throat and runny nose, cough worst at night Taking Guaifenesin OTC  Plan: Continue w/  OTCs. If cough continues, add hydrocodone for cough suppression Use albuterol as needed for wheezing Patient instructed to call in a few days if the symptoms are not improving

## 2014-07-24 ENCOUNTER — Other Ambulatory Visit: Payer: Self-pay | Admitting: Internal Medicine

## 2014-09-20 DIAGNOSIS — J8410 Pulmonary fibrosis, unspecified: Secondary | ICD-10-CM | POA: Diagnosis not present

## 2014-09-25 DIAGNOSIS — J449 Chronic obstructive pulmonary disease, unspecified: Secondary | ICD-10-CM | POA: Diagnosis not present

## 2014-09-25 DIAGNOSIS — J8410 Pulmonary fibrosis, unspecified: Secondary | ICD-10-CM | POA: Diagnosis not present

## 2014-10-03 ENCOUNTER — Other Ambulatory Visit: Payer: Self-pay | Admitting: Internal Medicine

## 2014-10-16 ENCOUNTER — Encounter: Payer: Self-pay | Admitting: Internal Medicine

## 2014-10-16 ENCOUNTER — Ambulatory Visit (INDEPENDENT_AMBULATORY_CARE_PROVIDER_SITE_OTHER): Payer: Medicare PPO | Admitting: Internal Medicine

## 2014-10-16 ENCOUNTER — Telehealth: Payer: Self-pay | Admitting: Internal Medicine

## 2014-10-16 VITALS — BP 122/64 | HR 71 | Temp 98.0°F | Ht 68.0 in | Wt 208.2 lb

## 2014-10-16 DIAGNOSIS — R351 Nocturia: Secondary | ICD-10-CM | POA: Diagnosis not present

## 2014-10-16 DIAGNOSIS — I10 Essential (primary) hypertension: Secondary | ICD-10-CM

## 2014-10-16 DIAGNOSIS — D2330 Other benign neoplasm of skin of unspecified part of face: Secondary | ICD-10-CM | POA: Diagnosis not present

## 2014-10-16 DIAGNOSIS — N400 Enlarged prostate without lower urinary tract symptoms: Secondary | ICD-10-CM

## 2014-10-16 DIAGNOSIS — R21 Rash and other nonspecific skin eruption: Secondary | ICD-10-CM | POA: Diagnosis not present

## 2014-10-16 LAB — URINALYSIS, ROUTINE W REFLEX MICROSCOPIC
Bilirubin Urine: NEGATIVE
Hgb urine dipstick: NEGATIVE
Ketones, ur: NEGATIVE
Leukocytes, UA: NEGATIVE
Nitrite: NEGATIVE
Specific Gravity, Urine: 1.02 (ref 1.000–1.030)
Urine Glucose: NEGATIVE
Urobilinogen, UA: 1 (ref 0.0–1.0)
pH: 6 (ref 5.0–8.0)

## 2014-10-16 LAB — PSA: PSA: 0.59 ng/mL (ref 0.10–4.00)

## 2014-10-16 MED ORDER — DOXYCYCLINE HYCLATE 100 MG PO TBEC: 100.0000 mg | DELAYED_RELEASE_TABLET | Freq: Two times a day (BID) | ORAL | Status: DC

## 2014-10-16 MED ORDER — KETOCONAZOLE 2 % EX CREA: 1.0000 "application " | TOPICAL_CREAM | Freq: Every day | CUTANEOUS | Status: DC

## 2014-10-16 MED ORDER — HYDROCODONE-ACETAMINOPHEN 5-500 MG PO TABS: 1.0000 | ORAL_TABLET | Freq: Four times a day (QID) | ORAL | Status: DC | PRN

## 2014-10-16 MED ORDER — COLCHICINE 0.6 MG PO TABS: 0.6000 mg | ORAL_TABLET | Freq: Every day | ORAL | Status: DC | PRN

## 2014-10-16 NOTE — Progress Notes (Signed)
Pre visit review using our clinic review tool, if applicable. No additional management support is needed unless otherwise documented below in the visit note. 

## 2014-10-16 NOTE — Progress Notes (Signed)
Subjective:    Patient ID: Bradley Johns, male    DOB: 12-29-1934, 80 y.o.   MRN: [id number]  DOS:  10/16/2014 Type of visit - description :  Routine visit, multiple concerns Interval history: Long history of on and off tingling feeling at the side of the left thigh if he stands for long time. Denies back pain per se, no bladder or bowel incontinence, no recent injury, fever or chills. No distal lower extremity paresthesias.  One-month history of a mass at the left face, at some points he saw some "cheesy" discharge, no actual bloody or purulent discharge, no pain, no fever chills.  Nocturia, on and off, wakes up to 3 times a night and urinate significantly amount of urine. Denies dysuria, gross hematuria or difficulty urinating No lower extremity edema during the daytime.  hypertension, confused about the dose of losartan   Complains of a skin maceration on the L groin, no itching or discharge  Review of Systems  See history of present illness Denies lower extremity edema   Past Medical History  Diagnosis Date  . COPD (chronic obstructive pulmonary disease)     24/7 O2  . Diabetes mellitus     per Dr Posey Pronto  . Gout   . Hypertension   . Hyperlipidemia   . Sleep apnea   . Arthritis   . Benign prostatic hypertrophy   . Pulmonary fibrosis 2014    Past Surgical History  Procedure Laterality Date  . Toe surgery      History   Social History  . Marital Status: Married    Spouse Name: N/A  . Number of Children: 2  . Years of Education: N/A   Occupational History  . retired    Social History Main Topics  . Smoking status: Former Smoker -- 1.00 packs/day for 30 years    Types: Cigarettes    Quit date: 04/07/1996  . Smokeless tobacco: Never Used  . Alcohol Use: Yes     Comment: occassional beer  . Drug Use: No  . Sexual Activity: Not on file   Other Topics Concern  . Not on file   Social History Narrative   Lives w/ wife        Medication List         This list is accurate as of: 10/16/14  6:02 PM.  Always use your most recent med list.               albuterol 108 (90 BASE) MCG/ACT inhaler  Commonly known as:  VENTOLIN HFA  Inhale 2 puffs into the lungs every 6 (six) hours as needed for wheezing.     amLODipine 10 MG tablet  Commonly known as:  NORVASC  Take 1 tablet (10 mg total) by mouth daily.     aspirin 81 MG tablet  Take 81 mg by mouth daily.     atorvastatin 80 MG tablet  Commonly known as:  LIPITOR  Take 1 tablet (80 mg total) by mouth daily.     B-D ULTRAFINE III SHORT PEN 31G X 8 MM Misc  Generic drug:  Insulin Pen Needle     carvedilol 12.5 MG tablet  Commonly known as:  COREG  Take 1 tablet (12.5 mg total) by mouth 2 (two) times daily with a meal.     colchicine 0.6 MG tablet  Take 1 tablet (0.6 mg total) by mouth daily as needed.     doxazosin 4 MG tablet  Commonly known as:  CARDURA  Take 1 tablet (4 mg total) by mouth at bedtime.     doxycycline 100 MG EC tablet  Commonly known as:  DORYX  Take 1 tablet (100 mg total) by mouth 2 (two) times daily.     HUMALOG MIX 75/25 PEN [place]  Inject 30 Units into the skin daily.     HYDROcodone-acetaminophen 5-500 MG per tablet  Commonly known as:  VICODIN  Take 1 tablet by mouth every 6 (six) hours as needed.     ketoconazole 2 % cream  Commonly known as:  NIZORAL  Apply 1 application topically daily.     losartan 50 MG tablet  Commonly known as:  COZAAR  TAKE 1 TABLET EVERY DAY     metFORMIN 500 MG tablet  Commonly known as:  GLUCOPHAGE  Take 500 mg by mouth 2 (two) times daily with a meal.     multivitamin tablet  Take 1 tablet by mouth daily.     pioglitazone 45 MG tablet  Commonly known as:  ACTOS  Take 45 mg by mouth daily.     Tiotropium Bromide Monohydrate 2.5 MCG/ACT Aers  Commonly known as:  SPIRIVA RESPIMAT  Inhale 2 puffs into the lungs every morning.     zoster vaccine live (PF) 19400 UNT/0.65ML injection  Commonly known as:   ZOSTAVAX  Inject 19,400 Units into the skin once.           Objective:   Physical Exam  HENT:  Head:     BP 122/64 mmHg  Pulse 71  Temp(Src) 98 F (36.7 C) (Oral)  Ht 5\' 8"  (1.727 m)  Wt 208 lb 4 oz (94.462 kg)  BMI 31.67 kg/m2  SpO2 89%  General:   Well developed, well nourished . NAD.  HEENT:  Normocephalic . Face symmetric Skin: Not pale. Not jaundice Neurologic:  alert & oriented X3.  Speech normal, gait appropriate for age and unassisted DTRs absent in the lower extremities except for the right knee jerk. Diabetic foot exam: No lesions on the food, good pedal pulses, normal pinprick examination MSK: Back no TTP Skin: Has a area 103 cm at the left groin with maceration. DRE: Normal sphincter tone, prostate minimally enlarged, no TTP, not nodular Psych--  Cognition and judgment appear intact.  Cooperative with normal attention span and concentration.  Behavior appropriate. No anxious or depressed appearing.       Assessment & Plan:      Likely has a sebaceous cyst at the left face,  Plan: Refer to surgery, doxycycline for one week   rash, groin: Nizoral 2%,keep area clean and dry  Left leg paresthesia, radiculopathy? For now recommend conservative treatment

## 2014-10-16 NOTE — Telephone Encounter (Signed)
Spoke with Rodman Key at [facility], wanted to inform that they do not carry Doryx but another name brand doxycycline, he wanted to make sure okay to fill. Informed him okay as long as its doxycycline 100 mg. Rodman Key verbalized understanding.

## 2014-10-16 NOTE — Assessment & Plan Note (Signed)
BPH, On and off symptoms of nocturia, normal DRE and PSA last year. Check a UA and a PSA  H/o previous failure to [facility] , Urology referral  if needed

## 2014-10-16 NOTE — Patient Instructions (Addendum)
Please go to the lab  Take doxycycline 1 tablet twice a day for the next week for the cyst in your face We'll refer you to surgery  Apply the cream to your left groin once daily. Once or better, kidney area dry with powder or talk  Losartan: Take only 50 mg daily.  Please come back in 2 months to see how you're doing

## 2014-10-16 NOTE — Assessment & Plan Note (Signed)
HTN-- pt has been on losartan 50 mg qd w/ good control of BPs, last endocrine note stated losartan 100 mg Plan: Stay on 50 mg, will notify endocrinology.

## 2014-10-16 NOTE — Telephone Encounter (Signed)
Caller name: Magda Paganini at [facility]  Relation to pt: Call back number: [phone]  Pharmacy:  Reason for call:   Requesting clarification of doryx?

## 2014-10-17 ENCOUNTER — Telehealth: Payer: Self-pay

## 2014-10-17 LAB — URINE CULTURE
Colony Count: NO GROWTH
Organism ID, Bacteria: NO GROWTH

## 2014-10-17 NOTE — Telephone Encounter (Signed)
Received fax confirmation on 10/17/2014 at 0901.

## 2014-10-17 NOTE — Telephone Encounter (Signed)
OV notes from 10/16/2014 printed and faxed to Dr. Posey Pronto informing him to please update his medication list. Pt is taking Losartan 50 mg 1 tablet daily, not Losartan 100 mg.

## 2014-10-17 NOTE — Telephone Encounter (Signed)
Rodman Key at [facility] called requesting to speak with MD directly in regards to Pt's Hydrocodone medication. Per Rodman Key it is law to speak with you to change a class 2 substance. Please call him back at [phone] so that he may dispense medication to Pt. Thank you.

## 2014-10-18 MED ORDER — HYDROCODONE-ACETAMINOPHEN 5-325 MG PO TABS: 1.0000 | ORAL_TABLET | Freq: Four times a day (QID) | ORAL | Status: DC | PRN

## 2014-10-18 NOTE — Addendum Note (Signed)
Addended by: Kathlene November E on: 10/18/2014 05:09 PM   Modules accepted: Orders

## 2014-10-18 NOTE — Telephone Encounter (Addendum)
Needed approval to change Vicodin to 5-325, same sig.

## 2014-10-20 DIAGNOSIS — J8410 Pulmonary fibrosis, unspecified: Secondary | ICD-10-CM | POA: Diagnosis not present

## 2014-10-23 ENCOUNTER — Telehealth: Payer: Self-pay | Admitting: Internal Medicine

## 2014-10-23 MED ORDER — COLCHICINE 0.6 MG PO TABS: 0.6000 mg | ORAL_TABLET | Freq: Every day | ORAL | Status: DC | PRN

## 2014-10-23 NOTE — Telephone Encounter (Signed)
Caller name: [place] Relation to pt: Call back number: Pharmacy:  Reason for call:   Patient states that colchicine refill should have gone to [facility] on [address], not [address]

## 2014-10-23 NOTE — Telephone Encounter (Signed)
Rx resent to [facility] as requested.

## 2014-10-25 DIAGNOSIS — J449 Chronic obstructive pulmonary disease, unspecified: Secondary | ICD-10-CM | POA: Diagnosis not present

## 2014-10-25 DIAGNOSIS — J8410 Pulmonary fibrosis, unspecified: Secondary | ICD-10-CM | POA: Diagnosis not present

## 2014-11-10 DIAGNOSIS — E119 Type 2 diabetes mellitus without complications: Secondary | ICD-10-CM | POA: Diagnosis not present

## 2014-11-10 DIAGNOSIS — G4733 Obstructive sleep apnea (adult) (pediatric): Secondary | ICD-10-CM | POA: Diagnosis not present

## 2014-11-10 DIAGNOSIS — H5231 Anisometropia: Secondary | ICD-10-CM | POA: Diagnosis not present

## 2014-11-10 DIAGNOSIS — H18413 Arcus senilis, bilateral: Secondary | ICD-10-CM | POA: Diagnosis not present

## 2014-11-10 DIAGNOSIS — H524 Presbyopia: Secondary | ICD-10-CM | POA: Diagnosis not present

## 2014-11-10 DIAGNOSIS — H04123 Dry eye syndrome of bilateral lacrimal glands: Secondary | ICD-10-CM | POA: Diagnosis not present

## 2014-11-10 DIAGNOSIS — Z833 Family history of diabetes mellitus: Secondary | ICD-10-CM | POA: Diagnosis not present

## 2014-11-10 DIAGNOSIS — H40013 Open angle with borderline findings, low risk, bilateral: Secondary | ICD-10-CM | POA: Diagnosis not present

## 2014-11-10 DIAGNOSIS — H2513 Age-related nuclear cataract, bilateral: Secondary | ICD-10-CM | POA: Diagnosis not present

## 2014-11-20 DIAGNOSIS — J8410 Pulmonary fibrosis, unspecified: Secondary | ICD-10-CM | POA: Diagnosis not present

## 2014-11-25 DIAGNOSIS — J8410 Pulmonary fibrosis, unspecified: Secondary | ICD-10-CM | POA: Diagnosis not present

## 2014-11-25 DIAGNOSIS — J449 Chronic obstructive pulmonary disease, unspecified: Secondary | ICD-10-CM | POA: Diagnosis not present

## 2014-12-18 ENCOUNTER — Ambulatory Visit: Payer: Medicare PPO | Admitting: Internal Medicine

## 2014-12-21 DIAGNOSIS — J8410 Pulmonary fibrosis, unspecified: Secondary | ICD-10-CM | POA: Diagnosis not present

## 2014-12-26 DIAGNOSIS — J8410 Pulmonary fibrosis, unspecified: Secondary | ICD-10-CM | POA: Diagnosis not present

## 2014-12-26 DIAGNOSIS — J449 Chronic obstructive pulmonary disease, unspecified: Secondary | ICD-10-CM | POA: Diagnosis not present

## 2014-12-27 DIAGNOSIS — H04123 Dry eye syndrome of bilateral lacrimal glands: Secondary | ICD-10-CM | POA: Diagnosis not present

## 2014-12-27 DIAGNOSIS — G4733 Obstructive sleep apnea (adult) (pediatric): Secondary | ICD-10-CM | POA: Diagnosis not present

## 2014-12-27 DIAGNOSIS — H524 Presbyopia: Secondary | ICD-10-CM | POA: Diagnosis not present

## 2014-12-27 DIAGNOSIS — H5231 Anisometropia: Secondary | ICD-10-CM | POA: Diagnosis not present

## 2014-12-27 DIAGNOSIS — E119 Type 2 diabetes mellitus without complications: Secondary | ICD-10-CM | POA: Diagnosis not present

## 2014-12-27 DIAGNOSIS — H18423 Band keratopathy, bilateral: Secondary | ICD-10-CM | POA: Diagnosis not present

## 2014-12-27 DIAGNOSIS — Z01818 Encounter for other preprocedural examination: Secondary | ICD-10-CM | POA: Diagnosis not present

## 2014-12-27 DIAGNOSIS — H40013 Open angle with borderline findings, low risk, bilateral: Secondary | ICD-10-CM | POA: Diagnosis not present

## 2014-12-27 DIAGNOSIS — H2512 Age-related nuclear cataract, left eye: Secondary | ICD-10-CM | POA: Diagnosis not present

## 2014-12-29 ENCOUNTER — Ambulatory Visit (INDEPENDENT_AMBULATORY_CARE_PROVIDER_SITE_OTHER): Payer: Medicare PPO | Admitting: Internal Medicine

## 2014-12-29 ENCOUNTER — Other Ambulatory Visit: Payer: Self-pay

## 2014-12-29 ENCOUNTER — Encounter: Payer: Self-pay | Admitting: Internal Medicine

## 2014-12-29 VITALS — BP 124/58 | HR 61 | Temp 97.6°F | Ht 68.0 in | Wt 208.2 lb

## 2014-12-29 DIAGNOSIS — Z23 Encounter for immunization: Secondary | ICD-10-CM | POA: Diagnosis not present

## 2014-12-29 DIAGNOSIS — I10 Essential (primary) hypertension: Secondary | ICD-10-CM

## 2014-12-29 DIAGNOSIS — L720 Epidermal cyst: Secondary | ICD-10-CM

## 2014-12-29 DIAGNOSIS — Z09 Encounter for follow-up examination after completed treatment for conditions other than malignant neoplasm: Secondary | ICD-10-CM

## 2014-12-29 LAB — BASIC METABOLIC PANEL
BUN: 16 mg/dL (ref 6–23)
CO2: 34 mEq/L — ABNORMAL HIGH (ref 19–32)
Calcium: 9.7 mg/dL (ref 8.4–10.5)
Chloride: 102 mEq/L (ref 96–112)
Creatinine, Ser: 1.14 mg/dL (ref 0.40–1.50)
GFR: 79.85 mL/min (ref >60.00)
Glucose, Bld: 71 mg/dL (ref 70–99)
Potassium: 4.5 mEq/L (ref 3.5–5.1)
Sodium: 141 mEq/L (ref 135–145)

## 2014-12-29 NOTE — Patient Instructions (Signed)
Get your blood work before you leave   we are referring you to ENT doctor about the cyst in the face   Next visit  for a physical exam, fasting by 06-2015   Please schedule an appointment at the front desk Please come back fasting

## 2014-12-29 NOTE — Progress Notes (Signed)
Pre visit review using our clinic review tool, if applicable. No additional management support is needed unless otherwise documented below in the visit note. 

## 2014-12-29 NOTE — Progress Notes (Signed)
Subjective:    Patient ID: Bradley Johns, male    DOB: July 14, 1934, 80 y.o.   MRN: [id number]  DOS:  12/29/2014 Type of visit - description : Follow-up from previous visit Interval history: Has a cyst in the face, status post doxycycline, better. Was seen with nocturia, labs were negative, symptoms at baseline Meds reviewed, good compliance with all of them  Review of Systems Denies fever chills Difficulty breathing at baseline, good compliance with oxygen No nausea or vomiting Occasionally has dry cough  Past Medical History  Diagnosis Date  . COPD (chronic obstructive pulmonary disease)     24/7 O2  . Diabetes mellitus     per Dr Allena Katz  . Gout   . Hypertension   . Hyperlipidemia   . Sleep apnea   . Arthritis   . Benign prostatic hypertrophy   . Pulmonary fibrosis 2014    Past Surgical History  Procedure Laterality Date  . Toe surgery      Social History   Social History  . Marital Status: Married    Spouse Name: N/A  . Number of Children: 2  . Years of Education: N/A   Occupational History  . retired    Social History Main Topics  . Smoking status: Former Smoker -- 1.00 packs/day for 30 years    Types: Cigarettes    Quit date: 04/07/1996  . Smokeless tobacco: Never Used  . Alcohol Use: Yes     Comment: occassional beer  . Drug Use: No  . Sexual Activity: Not on file   Other Topics Concern  . Not on file   Social History Narrative   Lives w/ wife        Medication List       This list is accurate as of: 12/29/14  2:12 PM.  Always use your most recent med list.               ACCU-CHEK AVIVA PLUS test strip  Generic drug:  glucose blood     ACCU-CHEK AVIVA PLUS W/DEVICE Kit     ACCU-CHEK SOFTCLIX LANCETS lancets     albuterol 108 (90 BASE) MCG/ACT inhaler  Commonly known as:  VENTOLIN HFA  Inhale 2 puffs into the lungs every 6 (six) hours as needed for wheezing.     amLODipine 10 MG tablet  Commonly known as:  NORVASC  Take 1  tablet (10 mg total) by mouth daily.     aspirin 81 MG tablet  Take 81 mg by mouth daily.     atorvastatin 80 MG tablet  Commonly known as:  LIPITOR  Take 1 tablet (80 mg total) by mouth daily.     B-D UF III MINI PEN NEEDLES 31G X 5 MM Misc  Generic drug:  Insulin Pen Needle     carvedilol 12.5 MG tablet  Commonly known as:  COREG  Take 1 tablet (12.5 mg total) by mouth 2 (two) times daily with a meal.     colchicine 0.6 MG tablet  Take 1 tablet (0.6 mg total) by mouth daily as needed.     doxazosin 4 MG tablet  Commonly known as:  CARDURA  Take 1 tablet (4 mg total) by mouth at bedtime.     doxycycline 100 MG EC tablet  Commonly known as:  DORYX  Take 1 tablet (100 mg total) by mouth 2 (two) times daily.     HUMALOG MIX 75/25 PEN [place]  Inject 30 Units into the skin daily.  HYDROcodone-acetaminophen 5-325 MG per tablet  Commonly known as:  NORCO/VICODIN  Take 1 tablet by mouth every 6 (six) hours as needed for moderate pain.     ketoconazole 2 % cream  Commonly known as:  NIZORAL  Apply 1 application topically daily.     losartan 50 MG tablet  Commonly known as:  COZAAR  TAKE 1 TABLET EVERY DAY     metFORMIN 500 MG tablet  Commonly known as:  GLUCOPHAGE  Take 500 mg by mouth 2 (two) times daily with a meal.     multivitamin tablet  Take 1 tablet by mouth daily.     pioglitazone 45 MG tablet  Commonly known as:  ACTOS  Take 45 mg by mouth daily.     Tiotropium Bromide Monohydrate 2.5 MCG/ACT Aers  Commonly known as:  SPIRIVA RESPIMAT  Inhale 2 puffs into the lungs every morning.     zoster vaccine live (PF) 19400 UNT/0.65ML injection  Commonly known as:  ZOSTAVAX  Inject 19,400 Units into the skin once.           Objective:   Physical Exam BP 124/58 mmHg  Pulse 61  Temp(Src) 97.6 F (36.4 C) (Oral)  Ht $R'5\' 8"'NV$  (1.727 m)  Wt 208 lb 4 oz (94.462 kg)  BMI 31.67 kg/m2  SpO2 90% General:   Well developed, well nourished . NAD.  HEENT:    Normocephalic . Face symmetric, atraumatic Lungs:  Decreased breath sounds Normal respiratory effort, no intercostal retractions, no accessory muscle use. Heart: RRR,  no murmur.  No pretibial edema bilaterally  Skin: Still has a hard nodule at the left side of the face, this time it measures 1.5 cm and is bilobar . There is a opening on top but no discharge or fat material noted. Neurologic:  alert & oriented X3.  Speech normal, gait appropriate for age and unassisted Psych--  Cognition and judgment appear intact.  Cooperative with normal attention span and concentration.  Behavior appropriate. No anxious or depressed appearing.      Assessment & Plan:  Assessment > DM Dr. Posey Pronto  HTN Hyperlipidemia DJD-- rarely uses hydrocodone Gout -- occ colchicine COPD on oxygen OSA Pulmonary fibrosis BPH myrbetriq -no help  Plan:  HTN: Seems well-controlled, check a BMP Cyst, left face: Was referred to surgery but that never happened. Refer to ENT Cataracts: To have surgery in the next few weeks Primary care: Flu shot and Prevnar today

## 2014-12-30 DIAGNOSIS — Z09 Encounter for follow-up examination after completed treatment for conditions other than malignant neoplasm: Secondary | ICD-10-CM | POA: Insufficient documentation

## 2014-12-30 NOTE — Assessment & Plan Note (Signed)
HTN: Seems well-controlled, check a BMP Cyst, left face: Was referred to surgery but that never happened. Refer to ENT Cataracts: To have surgery in the next few weeks Primary care: Flu shot and Prevnar today

## 2015-01-03 DIAGNOSIS — H25812 Combined forms of age-related cataract, left eye: Secondary | ICD-10-CM | POA: Diagnosis not present

## 2015-01-03 DIAGNOSIS — H2512 Age-related nuclear cataract, left eye: Secondary | ICD-10-CM | POA: Diagnosis not present

## 2015-01-11 DIAGNOSIS — H2511 Age-related nuclear cataract, right eye: Secondary | ICD-10-CM | POA: Diagnosis not present

## 2015-01-20 DIAGNOSIS — J8410 Pulmonary fibrosis, unspecified: Secondary | ICD-10-CM | POA: Diagnosis not present

## 2015-01-25 DIAGNOSIS — J8410 Pulmonary fibrosis, unspecified: Secondary | ICD-10-CM | POA: Diagnosis not present

## 2015-01-25 DIAGNOSIS — J449 Chronic obstructive pulmonary disease, unspecified: Secondary | ICD-10-CM | POA: Diagnosis not present

## 2015-01-31 DIAGNOSIS — E7800 Pure hypercholesterolemia, unspecified: Secondary | ICD-10-CM | POA: Diagnosis not present

## 2015-01-31 DIAGNOSIS — E1165 Type 2 diabetes mellitus with hyperglycemia: Secondary | ICD-10-CM | POA: Diagnosis not present

## 2015-01-31 DIAGNOSIS — I10 Essential (primary) hypertension: Secondary | ICD-10-CM | POA: Diagnosis not present

## 2015-02-01 ENCOUNTER — Telehealth: Payer: Self-pay | Admitting: Internal Medicine

## 2015-02-01 NOTE — Telephone Encounter (Signed)
Caller name: Marta Lamas ([facility])   Can be reached: [phone]  Reason for call: She called in requesting pt's most recent lab results. Faxed per her request to fax # [phone]

## 2015-02-06 DIAGNOSIS — L720 Epidermal cyst: Secondary | ICD-10-CM | POA: Diagnosis not present

## 2015-02-20 DIAGNOSIS — J8410 Pulmonary fibrosis, unspecified: Secondary | ICD-10-CM | POA: Diagnosis not present

## 2015-02-23 ENCOUNTER — Other Ambulatory Visit: Payer: Self-pay | Admitting: Internal Medicine

## 2015-02-25 DIAGNOSIS — J449 Chronic obstructive pulmonary disease, unspecified: Secondary | ICD-10-CM | POA: Diagnosis not present

## 2015-02-25 DIAGNOSIS — J8410 Pulmonary fibrosis, unspecified: Secondary | ICD-10-CM | POA: Diagnosis not present

## 2015-03-22 DIAGNOSIS — J8410 Pulmonary fibrosis, unspecified: Secondary | ICD-10-CM | POA: Diagnosis not present

## 2015-03-27 DIAGNOSIS — J8410 Pulmonary fibrosis, unspecified: Secondary | ICD-10-CM | POA: Diagnosis not present

## 2015-03-27 DIAGNOSIS — J449 Chronic obstructive pulmonary disease, unspecified: Secondary | ICD-10-CM | POA: Diagnosis not present

## 2015-04-11 DIAGNOSIS — H20043 Secondary noninfectious iridocyclitis, bilateral: Secondary | ICD-10-CM | POA: Diagnosis not present

## 2015-04-11 DIAGNOSIS — H2511 Age-related nuclear cataract, right eye: Secondary | ICD-10-CM | POA: Diagnosis not present

## 2015-04-11 DIAGNOSIS — Z961 Presence of intraocular lens: Secondary | ICD-10-CM | POA: Diagnosis not present

## 2015-04-11 DIAGNOSIS — H40013 Open angle with borderline findings, low risk, bilateral: Secondary | ICD-10-CM | POA: Diagnosis not present

## 2015-04-11 DIAGNOSIS — H5231 Anisometropia: Secondary | ICD-10-CM | POA: Diagnosis not present

## 2015-04-11 DIAGNOSIS — H04123 Dry eye syndrome of bilateral lacrimal glands: Secondary | ICD-10-CM | POA: Diagnosis not present

## 2015-04-11 DIAGNOSIS — E119 Type 2 diabetes mellitus without complications: Secondary | ICD-10-CM | POA: Diagnosis not present

## 2015-04-19 ENCOUNTER — Other Ambulatory Visit: Payer: Self-pay | Admitting: Internal Medicine

## 2015-04-22 DIAGNOSIS — J8410 Pulmonary fibrosis, unspecified: Secondary | ICD-10-CM | POA: Diagnosis not present

## 2015-04-27 DIAGNOSIS — J449 Chronic obstructive pulmonary disease, unspecified: Secondary | ICD-10-CM | POA: Diagnosis not present

## 2015-04-27 DIAGNOSIS — J8410 Pulmonary fibrosis, unspecified: Secondary | ICD-10-CM | POA: Diagnosis not present

## 2015-05-23 DIAGNOSIS — J8410 Pulmonary fibrosis, unspecified: Secondary | ICD-10-CM | POA: Diagnosis not present

## 2015-05-28 DIAGNOSIS — J8410 Pulmonary fibrosis, unspecified: Secondary | ICD-10-CM | POA: Diagnosis not present

## 2015-05-28 DIAGNOSIS — J449 Chronic obstructive pulmonary disease, unspecified: Secondary | ICD-10-CM | POA: Diagnosis not present

## 2015-06-19 ENCOUNTER — Telehealth: Payer: Self-pay

## 2015-06-20 ENCOUNTER — Telehealth: Payer: Self-pay

## 2015-06-20 ENCOUNTER — Ambulatory Visit (INDEPENDENT_AMBULATORY_CARE_PROVIDER_SITE_OTHER): Payer: Medicare PPO | Admitting: Internal Medicine

## 2015-06-20 ENCOUNTER — Encounter: Payer: Self-pay | Admitting: Internal Medicine

## 2015-06-20 VITALS — BP 142/62 | HR 68 | Temp 97.5°F | Ht 68.0 in | Wt 211.2 lb

## 2015-06-20 DIAGNOSIS — E785 Hyperlipidemia, unspecified: Secondary | ICD-10-CM | POA: Diagnosis not present

## 2015-06-20 DIAGNOSIS — Z09 Encounter for follow-up examination after completed treatment for conditions other than malignant neoplasm: Secondary | ICD-10-CM

## 2015-06-20 DIAGNOSIS — L989 Disorder of the skin and subcutaneous tissue, unspecified: Secondary | ICD-10-CM | POA: Diagnosis not present

## 2015-06-20 DIAGNOSIS — J8410 Pulmonary fibrosis, unspecified: Secondary | ICD-10-CM | POA: Diagnosis not present

## 2015-06-20 DIAGNOSIS — I10 Essential (primary) hypertension: Secondary | ICD-10-CM

## 2015-06-20 DIAGNOSIS — J439 Emphysema, unspecified: Secondary | ICD-10-CM | POA: Diagnosis not present

## 2015-06-20 DIAGNOSIS — Z0000 Encounter for general adult medical examination without abnormal findings: Secondary | ICD-10-CM

## 2015-06-20 LAB — BASIC METABOLIC PANEL
BUN: 17 mg/dL (ref 6–23)
CO2: 33 mEq/L — ABNORMAL HIGH (ref 19–32)
Calcium: 9.4 mg/dL (ref 8.4–10.5)
Chloride: 102 mEq/L (ref 96–112)
Creatinine, Ser: 1.16 mg/dL (ref 0.40–1.50)
GFR: 78.17 mL/min (ref >60.00)
Glucose, Bld: 98 mg/dL (ref 70–99)
Potassium: 4.7 mEq/L (ref 3.5–5.1)
Sodium: 140 mEq/L (ref 135–145)

## 2015-06-20 LAB — CBC WITH DIFFERENTIAL/PLATELET
Basophils Absolute: 0 10*3/uL (ref 0.0–0.1)
Basophils Relative: 0.4 % (ref 0.0–3.0)
Eosinophils Absolute: 0.2 10*3/uL (ref 0.0–0.7)
Eosinophils Relative: 2.4 % (ref 0.0–5.0)
HCT: 47.1 % (ref 39.0–52.0)
Hemoglobin: 15.3 g/dL (ref 13.0–17.0)
Lymphocytes Relative: 21.4 % (ref 12.0–46.0)
Lymphs Abs: 1.4 10*3/uL (ref 0.7–4.0)
MCHC: 32.6 g/dL (ref 30.0–36.0)
MCV: 95 fl (ref 78.0–100.0)
Monocytes Absolute: 0.6 10*3/uL (ref 0.1–1.0)
Monocytes Relative: 8.9 % (ref 3.0–12.0)
Neutro Abs: 4.4 10*3/uL (ref 1.4–7.7)
Neutrophils Relative %: 66.9 % (ref 43.0–77.0)
Platelets: 175 10*3/uL (ref 150.0–400.0)
RBC: 4.95 Mil/uL (ref 4.22–5.81)
RDW: 15.4 % (ref 11.5–15.5)
WBC: 6.6 10*3/uL (ref 4.0–10.5)

## 2015-06-20 LAB — LIPID PANEL
Cholesterol: 132 mg/dL (ref 0–200)
HDL: 49.7 mg/dL (ref >39.00)
LDL Cholesterol: 68 mg/dL (ref 0–99)
NonHDL: 82.33
Total CHOL/HDL Ratio: 3
Triglycerides: 71 mg/dL (ref 0.0–149.0)
VLDL: 14.2 mg/dL (ref 0.0–40.0)

## 2015-06-20 LAB — ALT: ALT: 14 U/L (ref 0–53)

## 2015-06-20 LAB — AST: AST: 23 U/L (ref 0–37)

## 2015-06-20 LAB — TSH: TSH: 6.04 u[IU]/mL — ABNORMAL HIGH (ref 0.35–4.50)

## 2015-06-20 NOTE — Telephone Encounter (Signed)
Received fax confirmation on 06/20/2015 at 1317. Order form sent for scanning.

## 2015-06-20 NOTE — Assessment & Plan Note (Addendum)
Td   2011; pneumonia shot 2011   ;prevnar provided 12-2014; declined a shingles shot   CCS:  Had a Cscope w/ Drr Collene Mares before, repeated  Cscope 07-25-09 (d/t a  + hemocult)---- poor prep, biopsies were done. GI recommended six-month follow-up colonoscopy with a five-day prep. >>>>>> referral to repeat colonoscopy discussed with the patient , reluctant to proceed Prostate cancer screening: PSAs normal, screen no longer indicated.   Diet-exercise discussed

## 2015-06-20 NOTE — Progress Notes (Signed)
Pre visit review using our clinic review tool, if applicable. No additional management support is needed unless otherwise documented below in the visit note. 

## 2015-06-20 NOTE — Progress Notes (Signed)
Subjective:    Patient ID: Bradley Johns, male    DOB: 1934-11-08, 81 y.o.   MRN: [id number]  DOS:  06/20/2015 Type of visit - description : CPX Interval history: HTN: Good compliance of medication, BP today 142/76, ambulatory BPs when checked in the 130s COPD: Uses oxygen at night only, not taking any inhalers, admits to dry cough on and off. High cholesterol: On statins, no apparent side effects.    Review of Systems Constitutional: No fever. No chills. No unexplained wt changes. No unusual sweats  HEENT: No dental problems, no ear discharge, no facial swelling, no voice changes. No eye discharge, no eye  redness , no  intolerance to light   Respiratory: Has   cough but no sputum production  Cardiovascular: No CP, no leg swelling , no  Palpitations  GI: no nausea, no vomiting, no diarrhea , no  abdominal pain.  No blood in the stools. No dysphagia, no odynophagia    Endocrine: No polyphagia, no polyuria , no polydipsia  GU: No dysuria, gross hematuria, no difficulty urinating , + urgency  MSK: No joint swellings or unusual aches or pains  Skin: No change in the color of the skin, palor , no  Rash  Allergic, immunologic: No environmental allergies , no  food allergies  Neurological: No dizziness no  syncope. No headaches. No diplopia, no slurred, no slurred speech, no motor deficits, no facial  Numbness  Hematological: No enlarged lymph nodes, no easy bruising , no unusual bleedings  Psychiatry: No suicidal ideas, no hallucinations, no beavior problems, no confusion.  No unusual/severe anxiety, no depression   Past Medical History  Diagnosis Date  . COPD (chronic obstructive pulmonary disease) ([place])     24/7 O2  . Diabetes mellitus     per Dr Posey Pronto  . Gout   . Hypertension   . Hyperlipidemia   . Sleep apnea   . Arthritis   . Benign prostatic hypertrophy   . Pulmonary fibrosis ([place]) 2014  . Epidermal cyst of face     left lower lateral cheek    Past  Surgical History  Procedure Laterality Date  . Toe surgery      Social History   Social History  . Marital Status: Married    Spouse Name: N/A  . Number of Children: 2  . Years of Education: N/A   Occupational History  . retired    Social History Main Topics  . Smoking status: Former Smoker -- 1.00 packs/day for 30 years    Types: Cigarettes    Quit date: 04/07/1996  . Smokeless tobacco: Never Used  . Alcohol Use: Yes     Comment: occassional beer  . Drug Use: No  . Sexual Activity: Not on file   Other Topics Concern  . Not on file   Social History Narrative   Lives w/ wife     Family History  Problem Relation Age of Onset  . Liver disease Mother   . Coronary artery disease Father 25  . Diabetes Sister   . Cancer Brother     type?  Marland Kitchen Hypertension Neg Hx   . Hyperlipidemia Neg Hx   . Heart attack Neg Hx   . Prostate cancer Neg Hx   . Colon cancer Neg Hx       Medication List       This list is accurate as of: 06/20/15 11:59 PM.  Always use your most recent med list.  ACCU-CHEK AVIVA PLUS test strip  Generic drug:  glucose blood  Reported on 06/20/2015     ACCU-CHEK AVIVA PLUS w/Device Kit  Reported on 06/20/2015     ACCU-CHEK SOFTCLIX LANCETS lancets  Reported on 06/20/2015     amLODipine 10 MG tablet  Commonly known as:  NORVASC  Take 1 tablet (10 mg total) by mouth daily.     aspirin 81 MG tablet  Take 81 mg by mouth daily.     atorvastatin 80 MG tablet  Commonly known as:  LIPITOR  Take 1 tablet (80 mg total) by mouth daily.     B-D UF III MINI PEN NEEDLES 31G X 5 MM Misc  Generic drug:  Insulin Pen Needle  Reported on 06/20/2015     carvedilol 12.5 MG tablet  Commonly known as:  COREG  Take 1 tablet (12.5 mg total) by mouth 2 (two) times daily with a meal.     colchicine 0.6 MG tablet  Take 1 tablet (0.6 mg total) by mouth daily as needed.     doxazosin 4 MG tablet  Commonly known as:  CARDURA  Take 1 tablet (4 mg  total) by mouth at bedtime.     HUMALOG MIX 75/25 PEN [place]  Inject 30 Units into the skin daily. Reported on 06/19/2015     HYDROcodone-acetaminophen 5-325 MG tablet  Commonly known as:  NORCO/VICODIN  Take 1 tablet by mouth every 6 (six) hours as needed for moderate pain.     ketoconazole 2 % cream  Commonly known as:  NIZORAL  Apply 1 application topically daily.     losartan 50 MG tablet  Commonly known as:  COZAAR  Take 1 tablet (50 mg total) by mouth daily.     metFORMIN 500 MG tablet  Commonly known as:  GLUCOPHAGE  Take 500 mg by mouth 2 (two) times daily with a meal.     multivitamin tablet  Take 1 tablet by mouth daily.     OXYGEN  Inhale into the lungs. 3L Nocturnal and PRN for Hypoxemia     pioglitazone 45 MG tablet  Commonly known as:  ACTOS  Take 45 mg by mouth daily.           Objective:   Physical Exam BP 142/62 mmHg  Pulse 68  Temp(Src) 97.5 F (36.4 C) (Oral)  Ht '5\' 8"'$  (1.727 m)  Wt 211 lb 4 oz (95.822 kg)  BMI 32.13 kg/m2  SpO2 86% General:   Well developed, well nourished . NAD.  Neck: No  Thyromegaly  HEENT:  Normocephalic . Face symmetric, atraumatic Lungs:  Decreased breath sounds bilaterally. Normal respiratory effort, no intercostal retractions, no accessory muscle use. Heart: RRR,  no murmur.  No pretibial edema bilaterally  Abdomen:  Not distended, soft, non-tender. No rebound or rigidity.   Skin: Cyst unchanged Neurologic:  alert & oriented X3.  Speech normal, gait appropriate for age and unassisted Strength symmetric and appropriate for age.  Psych: Cognition and judgment appear intact.  Cooperative with normal attention span and concentration.  Behavior appropriate. No anxious or depressed appearing.     Assessment & Plan:   Assessment > DM Dr. Posey Pronto  HTN Hyperlipidemia DJD-- rarely uses hydrocodone Gout -- occ colchicine COPD on oxygen qhs, rec O2 24/7 on 06-2015 OSA - no compliant w/ cpap Pulmonary fibrosis BPH  myrbetriq -no help    PLAN  DM: Per endocrinology, likes to eat healthier, counseled, see instructions. HTN:  Systolic BP today in  the 140s, at home in the 130s. No change, continue monitoring ambulatory BPs Hyperlipidemia: Continue statins, check labs. COPD: Declined to take inhalers, benefits discussed. O2 sat checked twice, at rest, without oxygen >>> 86%. Will call [employer], try to get better equipment for ambulatory O2 use (current equpment is bulky and pt reluctant to use).  Sebaceous cyst,  Re-refer to ENT, needs to be seen  in [facility]  RTC  6 months  We will send a copy of labs to endocrinology

## 2015-06-20 NOTE — Telephone Encounter (Signed)
[place] order form completed for Pt for a lighter O2 system and faxed to [phone] w/ OV notes from 06/20/2015, demographic sheet and insurance cards.

## 2015-06-20 NOTE — Patient Instructions (Signed)
Get your blood work before you leave       All about diabetes, great resource!  [email or web address].za.html    Next visit 6 months    Fall Prevention and Home Safety Falls cause injuries and can affect all age groups. It is possible to use preventive measures to significantly decrease the likelihood of falls. There are many simple measures which can make your home safer and prevent falls. OUTDOORS  Repair cracks and edges of walkways and driveways.  Remove high doorway thresholds.  Trim shrubbery on the main path into your home.  Have good outside lighting.  Clear walkways of tools, rocks, debris, and clutter.  Check that handrails are not broken and are securely fastened. Both sides of steps should have handrails.  Have leaves, snow, and ice cleared regularly.  Use sand or salt on walkways during winter months.  In the garage, clean up grease or oil spills. BATHROOM  Install night lights.  Install grab bars by the toilet and in the tub and shower.  Use non-skid mats or decals in the tub or shower.  Place a plastic non-slip stool in the shower to sit on, if needed.  Keep floors dry and clean up all water on the floor immediately.  Remove soap buildup in the tub or shower on a regular basis.  Secure bath mats with non-slip, double-sided rug tape.  Remove throw rugs and tripping hazards from the floors. BEDROOMS  Install night lights.  Make sure a bedside light is easy to reach.  Do not use oversized bedding.  Keep a telephone by your bedside.  Have a firm chair with side arms to use for getting dressed.  Remove throw rugs and tripping hazards from the floor. KITCHEN  Keep handles on pots and pans turned toward the center of the stove. Use back burners when possible.  Clean up spills quickly and allow time for drying.  Avoid walking on wet floors.  Avoid hot utensils and knives.  Position shelves so they are not too high or  low.  Place commonly used objects within easy reach.  If necessary, use a sturdy step stool with a grab bar when reaching.  Keep electrical cables out of the way.  Do not use floor polish or wax that makes floors slippery. If you must use wax, use non-skid floor wax.  Remove throw rugs and tripping hazards from the floor. STAIRWAYS  Never leave objects on stairs.  Place handrails on both sides of stairways and use them. Fix any loose handrails. Make sure handrails on both sides of the stairways are as long as the stairs.  Check carpeting to make sure it is firmly attached along stairs. Make repairs to worn or loose carpet promptly.  Avoid placing throw rugs at the top or bottom of stairways, or properly secure the rug with carpet tape to prevent slippage. Get rid of throw rugs, if possible.  Have an electrician put in a light switch at the top and bottom of the stairs. OTHER FALL PREVENTION TIPS  Wear low-heel or rubber-soled shoes that are supportive and fit well. Wear closed toe shoes.  When using a stepladder, make sure it is fully opened and both spreaders are firmly locked. Do not climb a closed stepladder.  Add color or contrast paint or tape to grab bars and handrails in your home. Place contrasting color strips on first and last steps.  Learn and use mobility aids as needed. Install an electrical emergency response system.  Turn on  lights to avoid dark areas. Replace light bulbs that burn out immediately. Get light switches that glow.  Arrange furniture to create clear pathways. Keep furniture in the same place.  Firmly attach carpet with non-skid or double-sided tape.  Eliminate uneven floor surfaces.  Select a carpet pattern that does not visually hide the edge of steps.  Be aware of all pets. OTHER HOME SAFETY TIPS  Set the water temperature for 120 F (48.8 C).  Keep emergency numbers on or near the telephone.  Keep smoke detectors on every level of the  home and near sleeping areas. Document Released: 03/14/2002 Document Revised: 09/23/2011 Document Reviewed: 06/13/2011 [facility] Patient Information 2015 [place], [place]. This information is not intended to replace advice given to you by your health care provider. Make sure you discuss any questions you have with your health care provider.   Preventive Care for Adults Ages 71 and over  Blood pressure check.** / Every 1 to 2 years.  Lipid and cholesterol check.**/ Every 5 years beginning at age 30.  Lung cancer screening. / Every year if you are aged 81-80 years and have a 30-pack-year history of smoking and currently smoke or have quit within the past 15 years. Yearly screening is stopped once you have quit smoking for at least 15 years or develop a health problem that would prevent you from having lung cancer treatment.  Fecal occult blood test (FOBT) of stool. / Every year beginning at age 110 and continuing until age 30. You may not have to do this test if you get a colonoscopy every 10 years.  Flexible sigmoidoscopy** or colonoscopy.** / Every 5 years for a flexible sigmoidoscopy or every 10 years for a colonoscopy beginning at age 8 and continuing until age 38.  Hepatitis C blood test.** / For all people born from [birth year] through 1963 and any individual with known risks for hepatitis C.  Abdominal aortic aneurysm (AAA) screening.** / A one-time screening for ages 42 to 46 years who are current or former smokers.  Skin self-exam. / Monthly.  Influenza vaccine. / Every year.  Tetanus, diphtheria, and acellular pertussis (Tdap/Td) vaccine.** / 1 dose of Td every 10 years.  Varicella vaccine.** / Consult your health care provider.  Zoster vaccine.** / 1 dose for adults aged 13 years or older.  Pneumococcal 13-valent conjugate (PCV13) vaccine.** / Consult your health care provider.  Pneumococcal polysaccharide (PPSV23) vaccine.** / 1 dose for all adults aged 49 years and  older.  Meningococcal vaccine.** / Consult your health care provider.  Hepatitis A vaccine.** / Consult your health care provider.  Hepatitis B vaccine.** / Consult your health care provider.  Haemophilus influenzae type b (Hib) vaccine.** / Consult your health care provider. **Family history and personal history of risk and conditions may change your health care provider's recommendations. Document Released: 05/20/2001 Document Revised: 03/29/2013 Document Reviewed: 08/19/2010 [facility] Patient Information 2015 [place], [place]. This information is not intended to replace advice given to you by your health care provider. Make sure you discuss any questions you have with your health care provider.

## 2015-06-21 NOTE — Telephone Encounter (Signed)
Received fax confirmation on 06/21/2015 at 1105.

## 2015-06-21 NOTE — Assessment & Plan Note (Signed)
DM: Per endocrinology, likes to eat healthier, counseled, see instructions. HTN:  Systolic BP today in the 0000000, at home in the 130s. No change, continue monitoring ambulatory BPs Hyperlipidemia: Continue statins, check labs. COPD: Declined to take inhalers, benefits discussed. O2 sat checked twice, at rest, without oxygen >>> 86%. Will call [employer], try to get better equipment for ambulatory O2 use (current equpment is bulky and pt reluctant to use).  Sebaceous cyst,  Re-refer to ENT, needs to be seen  in [facility]  RTC  6 months  We will send a copy of labs to endocrinology

## 2015-06-21 NOTE — Telephone Encounter (Signed)
Received fax from [place] today, requesting copy of last oximetry for Pt (last in chart was 2014), Dr. Larose Kells has given authorization to them to perform oximetry at rest and w/ activities while on portable O2 unit and to adjust as needed. Form signed and faxed back to James/Carol, [employer], at [phone]. Form sent for scanning.

## 2015-06-25 DIAGNOSIS — J449 Chronic obstructive pulmonary disease, unspecified: Secondary | ICD-10-CM | POA: Diagnosis not present

## 2015-06-25 DIAGNOSIS — J8410 Pulmonary fibrosis, unspecified: Secondary | ICD-10-CM | POA: Diagnosis not present

## 2015-06-28 DIAGNOSIS — L723 Sebaceous cyst: Secondary | ICD-10-CM | POA: Diagnosis not present

## 2015-07-09 ENCOUNTER — Telehealth: Payer: Self-pay | Admitting: *Deleted

## 2015-07-09 NOTE — Telephone Encounter (Signed)
Received Physician Order; forwarded to provider/SLS 04/03

## 2015-07-13 ENCOUNTER — Telehealth: Payer: Self-pay

## 2015-07-13 NOTE — Telephone Encounter (Signed)
Spoke w/ [facility], needing clarificaition if Pt is supposed to be on Losartan 100 mg or Losartan 50 mg. Per med list, Pt is supposed to be on Losartan 50 mg 1 tablet daily.

## 2015-07-21 DIAGNOSIS — J8410 Pulmonary fibrosis, unspecified: Secondary | ICD-10-CM | POA: Diagnosis not present

## 2015-07-26 DIAGNOSIS — J449 Chronic obstructive pulmonary disease, unspecified: Secondary | ICD-10-CM | POA: Diagnosis not present

## 2015-07-26 DIAGNOSIS — J8410 Pulmonary fibrosis, unspecified: Secondary | ICD-10-CM | POA: Diagnosis not present

## 2015-08-06 DIAGNOSIS — I10 Essential (primary) hypertension: Secondary | ICD-10-CM | POA: Diagnosis not present

## 2015-08-06 DIAGNOSIS — E7800 Pure hypercholesterolemia, unspecified: Secondary | ICD-10-CM | POA: Diagnosis not present

## 2015-08-06 DIAGNOSIS — E119 Type 2 diabetes mellitus without complications: Secondary | ICD-10-CM | POA: Diagnosis not present

## 2015-08-20 DIAGNOSIS — J8410 Pulmonary fibrosis, unspecified: Secondary | ICD-10-CM | POA: Diagnosis not present

## 2015-08-25 DIAGNOSIS — J8410 Pulmonary fibrosis, unspecified: Secondary | ICD-10-CM | POA: Diagnosis not present

## 2015-08-25 DIAGNOSIS — J449 Chronic obstructive pulmonary disease, unspecified: Secondary | ICD-10-CM | POA: Diagnosis not present

## 2015-09-07 NOTE — Telephone Encounter (Signed)
Pre Visit Call. 

## 2015-09-20 DIAGNOSIS — J8410 Pulmonary fibrosis, unspecified: Secondary | ICD-10-CM | POA: Diagnosis not present

## 2015-09-22 DIAGNOSIS — Z7984 Long term (current) use of oral hypoglycemic drugs: Secondary | ICD-10-CM | POA: Diagnosis not present

## 2015-09-22 DIAGNOSIS — Z87891 Personal history of nicotine dependence: Secondary | ICD-10-CM | POA: Diagnosis not present

## 2015-09-22 DIAGNOSIS — N400 Enlarged prostate without lower urinary tract symptoms: Secondary | ICD-10-CM | POA: Diagnosis not present

## 2015-09-22 DIAGNOSIS — I10 Essential (primary) hypertension: Secondary | ICD-10-CM | POA: Diagnosis not present

## 2015-09-22 DIAGNOSIS — J449 Chronic obstructive pulmonary disease, unspecified: Secondary | ICD-10-CM | POA: Diagnosis not present

## 2015-09-22 DIAGNOSIS — E785 Hyperlipidemia, unspecified: Secondary | ICD-10-CM | POA: Diagnosis not present

## 2015-09-22 DIAGNOSIS — E119 Type 2 diabetes mellitus without complications: Secondary | ICD-10-CM | POA: Diagnosis not present

## 2015-09-22 DIAGNOSIS — Z6832 Body mass index (BMI) 32.0-32.9, adult: Secondary | ICD-10-CM | POA: Diagnosis not present

## 2015-09-22 DIAGNOSIS — E669 Obesity, unspecified: Secondary | ICD-10-CM | POA: Diagnosis not present

## 2015-09-25 DIAGNOSIS — J8410 Pulmonary fibrosis, unspecified: Secondary | ICD-10-CM | POA: Diagnosis not present

## 2015-09-25 DIAGNOSIS — J449 Chronic obstructive pulmonary disease, unspecified: Secondary | ICD-10-CM | POA: Diagnosis not present

## 2015-10-10 DIAGNOSIS — I10 Essential (primary) hypertension: Secondary | ICD-10-CM | POA: Diagnosis not present

## 2015-10-10 DIAGNOSIS — E7800 Pure hypercholesterolemia, unspecified: Secondary | ICD-10-CM | POA: Diagnosis not present

## 2015-10-10 DIAGNOSIS — E119 Type 2 diabetes mellitus without complications: Secondary | ICD-10-CM | POA: Diagnosis not present

## 2015-10-24 DIAGNOSIS — H04123 Dry eye syndrome of bilateral lacrimal glands: Secondary | ICD-10-CM | POA: Diagnosis not present

## 2015-10-24 DIAGNOSIS — H40013 Open angle with borderline findings, low risk, bilateral: Secondary | ICD-10-CM | POA: Diagnosis not present

## 2015-10-24 DIAGNOSIS — E119 Type 2 diabetes mellitus without complications: Secondary | ICD-10-CM | POA: Diagnosis not present

## 2015-10-24 DIAGNOSIS — Z9989 Dependence on other enabling machines and devices: Secondary | ICD-10-CM | POA: Diagnosis not present

## 2015-10-24 DIAGNOSIS — H2511 Age-related nuclear cataract, right eye: Secondary | ICD-10-CM | POA: Diagnosis not present

## 2015-10-24 DIAGNOSIS — G4733 Obstructive sleep apnea (adult) (pediatric): Secondary | ICD-10-CM | POA: Diagnosis not present

## 2015-10-24 DIAGNOSIS — H18413 Arcus senilis, bilateral: Secondary | ICD-10-CM | POA: Diagnosis not present

## 2015-10-29 ENCOUNTER — Telehealth: Payer: Self-pay | Admitting: Internal Medicine

## 2015-10-29 MED ORDER — DOXAZOSIN MESYLATE 4 MG PO TABS: 4.0000 mg | ORAL_TABLET | Freq: Every day | ORAL | 1 refills | Status: DC

## 2015-10-29 NOTE — Telephone Encounter (Signed)
Relation to [id number] Call back number:[phone] Pharmacy: [facility], [place] [phone] (Phone) [phone] (Fax)     Reason for call:  Patient states [facility] in need of clarification regarding doxazosin (CARDURA) 4 MG tablet states Dr. Jeannett Senior; prescribes 2MG  of the same medication. Please advise

## 2015-10-29 NOTE — Telephone Encounter (Signed)
Spoke w/ Pt, he informed me he has been taking Doxazosin 4 mg 1 tablet daily. Informed him that is what we have on his medication list as well and instructed him to continue regimen per PCP's last OV note. Pt verbalized understanding. Rx sent to [place].

## 2015-11-02 DIAGNOSIS — J439 Emphysema, unspecified: Secondary | ICD-10-CM | POA: Diagnosis not present

## 2015-11-02 DIAGNOSIS — J8410 Pulmonary fibrosis, unspecified: Secondary | ICD-10-CM | POA: Diagnosis not present

## 2015-11-04 DIAGNOSIS — J449 Chronic obstructive pulmonary disease, unspecified: Secondary | ICD-10-CM | POA: Diagnosis not present

## 2015-12-03 DIAGNOSIS — J439 Emphysema, unspecified: Secondary | ICD-10-CM | POA: Diagnosis not present

## 2015-12-03 DIAGNOSIS — J8410 Pulmonary fibrosis, unspecified: Secondary | ICD-10-CM | POA: Diagnosis not present

## 2015-12-05 DIAGNOSIS — J449 Chronic obstructive pulmonary disease, unspecified: Secondary | ICD-10-CM | POA: Diagnosis not present

## 2015-12-21 ENCOUNTER — Ambulatory Visit (INDEPENDENT_AMBULATORY_CARE_PROVIDER_SITE_OTHER): Payer: Medicare PPO | Admitting: Internal Medicine

## 2015-12-21 ENCOUNTER — Encounter: Payer: Self-pay | Admitting: Internal Medicine

## 2015-12-21 VITALS — BP 132/78 | HR 59 | Temp 98.1°F | Resp 14 | Ht 68.0 in | Wt 209.1 lb

## 2015-12-21 DIAGNOSIS — M150 Primary generalized (osteo)arthritis: Secondary | ICD-10-CM | POA: Diagnosis not present

## 2015-12-21 DIAGNOSIS — I10 Essential (primary) hypertension: Secondary | ICD-10-CM

## 2015-12-21 DIAGNOSIS — M8949 Other hypertrophic osteoarthropathy, multiple sites: Secondary | ICD-10-CM

## 2015-12-21 DIAGNOSIS — M159 Polyosteoarthritis, unspecified: Secondary | ICD-10-CM

## 2015-12-21 DIAGNOSIS — Z23 Encounter for immunization: Secondary | ICD-10-CM

## 2015-12-21 LAB — BASIC METABOLIC PANEL
BUN: 15 mg/dL (ref 7–25)
CO2: 30 mmol/L (ref 20–31)
Calcium: 9.5 mg/dL (ref 8.6–10.3)
Chloride: 101 mmol/L (ref 98–110)
Creat: 1.36 mg/dL — ABNORMAL HIGH (ref 0.70–1.18)
Glucose, Bld: 87 mg/dL (ref 65–99)
Potassium: 4.5 mmol/L (ref 3.5–5.3)
Sodium: 139 mmol/L (ref 135–146)

## 2015-12-21 MED ORDER — ATORVASTATIN CALCIUM 80 MG PO TABS: 80.0000 mg | ORAL_TABLET | Freq: Every day | ORAL | 2 refills | Status: DC

## 2015-12-21 MED ORDER — DOXAZOSIN MESYLATE 4 MG PO TABS: 4.0000 mg | ORAL_TABLET | Freq: Every day | ORAL | 2 refills | Status: DC

## 2015-12-21 MED ORDER — LOSARTAN POTASSIUM 50 MG PO TABS: 50.0000 mg | ORAL_TABLET | Freq: Every day | ORAL | 2 refills | Status: DC

## 2015-12-21 MED ORDER — CARVEDILOL 12.5 MG PO TABS: 12.5000 mg | ORAL_TABLET | Freq: Two times a day (BID) | ORAL | 1 refills | Status: DC

## 2015-12-21 MED ORDER — AMLODIPINE BESYLATE 10 MG PO TABS: 10.0000 mg | ORAL_TABLET | Freq: Every day | ORAL | 2 refills | Status: DC

## 2015-12-21 NOTE — Progress Notes (Signed)
Subjective:    Patient ID: Bradley Johns, male    DOB: Feb 04, 1935, 81 y.o.   MRN: [id number]  DOS:  12/21/2015 Type of visit - description : Routine office visit Interval history: No major concerns. Good medication compliance, no apparent side effects. DM per endocrinology, note reviewed, under excellent control Ambulatory BPs always within normal when checked. Wonders if he could stop some of the medication he takes   Review of Systems   Past Medical History:  Diagnosis Date  . Arthritis   . Benign prostatic hypertrophy   . COPD (chronic obstructive pulmonary disease) ([place])    24/7 O2  . Diabetes mellitus    per Dr Posey Pronto  . Epidermal cyst of face    left lower lateral cheek  . Gout   . Hyperlipidemia   . Hypertension   . Pulmonary fibrosis ([place]) 2014  . Sleep apnea     Past Surgical History:  Procedure Laterality Date  . TOE SURGERY      Social History   Social History  . Marital status: Married    Spouse name: N/A  . Number of children: 2  . Years of education: N/A   Occupational History  . retired    Social History Main Topics  . Smoking status: Former Smoker    Packs/day: 1.00    Years: 30.00    Types: Cigarettes    Quit date: 04/07/1996  . Smokeless tobacco: Never Used  . Alcohol use Yes     Comment: occassional beer  . Drug use: No  . Sexual activity: Not on file   Other Topics Concern  . Not on file   Social History Narrative   Lives w/ wife        Medication List       Accurate as of 12/21/15  2:16 PM. Always use your most recent med list.          ACCU-CHEK AVIVA PLUS test strip Generic drug:  glucose blood Reported on 06/20/2015   ACCU-CHEK AVIVA PLUS w/Device Kit Reported on 06/20/2015   ACCU-CHEK SOFTCLIX LANCETS lancets Reported on 06/20/2015   amLODipine 10 MG tablet Commonly known as:  NORVASC Take 1 tablet (10 mg total) by mouth daily.   aspirin 81 MG tablet Take 81 mg by mouth daily.   atorvastatin 80 MG  tablet Commonly known as:  LIPITOR Take 1 tablet (80 mg total) by mouth daily.   B-D UF III MINI PEN NEEDLES 31G X 5 MM Misc Generic drug:  Insulin Pen Needle Reported on 06/20/2015   carvedilol 12.5 MG tablet Commonly known as:  COREG Take 1 tablet (12.5 mg total) by mouth 2 (two) times daily with a meal.   colchicine 0.6 MG tablet Take 1 tablet (0.6 mg total) by mouth daily as needed.   doxazosin 4 MG tablet Commonly known as:  CARDURA Take 1 tablet (4 mg total) by mouth at bedtime.   HUMALOG MIX 75/25 PEN [place] Inject 30 Units into the skin daily. Reported on 06/19/2015   HYDROcodone-acetaminophen 5-325 MG tablet Commonly known as:  NORCO/VICODIN Take 1 tablet by mouth every 6 (six) hours as needed for moderate pain.   ketoconazole 2 % cream Commonly known as:  NIZORAL Apply 1 application topically daily.   losartan 50 MG tablet Commonly known as:  COZAAR Take 1 tablet (50 mg total) by mouth daily.   metFORMIN 500 MG tablet Commonly known as:  GLUCOPHAGE Take 500 mg by mouth 2 (two) times  daily with a meal.   multivitamin tablet Take 1 tablet by mouth daily.   OXYGEN Inhale into the lungs. 3L Nocturnal and PRN for Hypoxemia   pioglitazone 45 MG tablet Commonly known as:  ACTOS Take 45 mg by mouth daily.          Objective:   Physical Exam BP 132/78 (BP Location: Left Arm, Patient Position: Sitting, Cuff Size: Normal)   Pulse (!) 59   Temp 98.1 F (36.7 C) (Oral)   Resp 14   Ht '5\' 8"'$  (1.727 m)   Wt 209 lb 2 oz (94.9 kg)   SpO2 93%   BMI 31.80 kg/m  General:   Well developed, well nourished . NAD.  HEENT:  Normocephalic . Face symmetric, atraumatic Lungs:  Decreased breath sounds otherwise clear Normal respiratory effort, no intercostal retractions, no accessory muscle use. Heart: RRR,  no murmur.  No pretibial edema bilaterally  Skin: Not pale. Not jaundice Neurologic:  alert & oriented X3.  Speech normal, gait appropriate for age and  unassisted Psych--  Cognition and judgment appear intact.  Cooperative with normal attention span and concentration.  Behavior appropriate. No anxious or depressed appearing.      Assessment & Plan:   Assessment > DM Dr. Posey Pronto  HTN Hyperlipidemia DJD-- rarely uses hydrocodone Gout -- occ colchicine COPD on oxygen qhs, rec O2 24/7 on 06-2015 OSA - no compliant w/ cpap Pulmonary fibrosis BPH myrbetriq -no help    PLAN  DM: Per endocrinology, last A1c 6.6. HTN: Continue amlodipine, carvedilol, Cardura, losartan. Check a BMP DJD: Rarely uses hydrocodone, does not need a refill Primary care: Flu shot today RTC 6 months, CPX

## 2015-12-21 NOTE — Patient Instructions (Signed)
GO TO THE LAB : Get the blood work     GO TO THE FRONT DESK Schedule your next appointment for a  complete physical exam in 6 months

## 2015-12-21 NOTE — Progress Notes (Signed)
Pre visit review using our clinic review tool, if applicable. No additional management support is needed unless otherwise documented below in the visit note. 

## 2015-12-23 NOTE — Assessment & Plan Note (Signed)
DM: Per endocrinology, last A1c 6.6. HTN: Continue amlodipine, carvedilol, Cardura, losartan. Check a BMP DJD: Rarely uses hydrocodone, does not need a refill Primary care: Flu shot today RTC 6 months, CPX

## 2016-01-03 DIAGNOSIS — J8410 Pulmonary fibrosis, unspecified: Secondary | ICD-10-CM | POA: Diagnosis not present

## 2016-01-03 DIAGNOSIS — J439 Emphysema, unspecified: Secondary | ICD-10-CM | POA: Diagnosis not present

## 2016-01-05 DIAGNOSIS — J449 Chronic obstructive pulmonary disease, unspecified: Secondary | ICD-10-CM | POA: Diagnosis not present

## 2016-01-24 DIAGNOSIS — H18413 Arcus senilis, bilateral: Secondary | ICD-10-CM | POA: Diagnosis not present

## 2016-01-24 DIAGNOSIS — H40013 Open angle with borderline findings, low risk, bilateral: Secondary | ICD-10-CM | POA: Diagnosis not present

## 2016-01-24 DIAGNOSIS — Z961 Presence of intraocular lens: Secondary | ICD-10-CM | POA: Diagnosis not present

## 2016-01-24 DIAGNOSIS — H02412 Mechanical ptosis of left eyelid: Secondary | ICD-10-CM | POA: Diagnosis not present

## 2016-01-24 DIAGNOSIS — H2511 Age-related nuclear cataract, right eye: Secondary | ICD-10-CM | POA: Diagnosis not present

## 2016-01-24 DIAGNOSIS — E119 Type 2 diabetes mellitus without complications: Secondary | ICD-10-CM | POA: Diagnosis not present

## 2016-01-24 DIAGNOSIS — H04123 Dry eye syndrome of bilateral lacrimal glands: Secondary | ICD-10-CM | POA: Diagnosis not present

## 2016-01-24 DIAGNOSIS — G4733 Obstructive sleep apnea (adult) (pediatric): Secondary | ICD-10-CM | POA: Diagnosis not present

## 2016-02-02 DIAGNOSIS — J439 Emphysema, unspecified: Secondary | ICD-10-CM | POA: Diagnosis not present

## 2016-02-02 DIAGNOSIS — J8410 Pulmonary fibrosis, unspecified: Secondary | ICD-10-CM | POA: Diagnosis not present

## 2016-02-04 DIAGNOSIS — J449 Chronic obstructive pulmonary disease, unspecified: Secondary | ICD-10-CM | POA: Diagnosis not present

## 2016-03-04 DIAGNOSIS — J8410 Pulmonary fibrosis, unspecified: Secondary | ICD-10-CM | POA: Diagnosis not present

## 2016-03-04 DIAGNOSIS — J439 Emphysema, unspecified: Secondary | ICD-10-CM | POA: Diagnosis not present

## 2016-03-06 DIAGNOSIS — J449 Chronic obstructive pulmonary disease, unspecified: Secondary | ICD-10-CM | POA: Diagnosis not present

## 2016-04-03 DIAGNOSIS — J8410 Pulmonary fibrosis, unspecified: Secondary | ICD-10-CM | POA: Diagnosis not present

## 2016-04-03 DIAGNOSIS — J439 Emphysema, unspecified: Secondary | ICD-10-CM | POA: Diagnosis not present

## 2016-04-05 DIAGNOSIS — J449 Chronic obstructive pulmonary disease, unspecified: Secondary | ICD-10-CM | POA: Diagnosis not present

## 2016-04-22 DIAGNOSIS — I10 Essential (primary) hypertension: Secondary | ICD-10-CM | POA: Diagnosis not present

## 2016-04-22 DIAGNOSIS — E7800 Pure hypercholesterolemia, unspecified: Secondary | ICD-10-CM | POA: Diagnosis not present

## 2016-04-22 DIAGNOSIS — E119 Type 2 diabetes mellitus without complications: Secondary | ICD-10-CM | POA: Diagnosis not present

## 2016-04-29 DIAGNOSIS — I10 Essential (primary) hypertension: Secondary | ICD-10-CM | POA: Diagnosis not present

## 2016-04-29 DIAGNOSIS — E7800 Pure hypercholesterolemia, unspecified: Secondary | ICD-10-CM | POA: Diagnosis not present

## 2016-04-29 DIAGNOSIS — E119 Type 2 diabetes mellitus without complications: Secondary | ICD-10-CM | POA: Diagnosis not present

## 2016-05-04 DIAGNOSIS — J8410 Pulmonary fibrosis, unspecified: Secondary | ICD-10-CM | POA: Diagnosis not present

## 2016-05-04 DIAGNOSIS — J439 Emphysema, unspecified: Secondary | ICD-10-CM | POA: Diagnosis not present

## 2016-05-06 DIAGNOSIS — J449 Chronic obstructive pulmonary disease, unspecified: Secondary | ICD-10-CM | POA: Diagnosis not present

## 2016-06-04 DIAGNOSIS — J449 Chronic obstructive pulmonary disease, unspecified: Secondary | ICD-10-CM | POA: Diagnosis not present

## 2016-06-04 DIAGNOSIS — J8410 Pulmonary fibrosis, unspecified: Secondary | ICD-10-CM | POA: Diagnosis not present

## 2016-06-04 DIAGNOSIS — J439 Emphysema, unspecified: Secondary | ICD-10-CM | POA: Diagnosis not present

## 2016-06-13 ENCOUNTER — Telehealth: Payer: Self-pay | Admitting: *Deleted

## 2016-06-13 NOTE — Progress Notes (Deleted)
Subjective:   Bradley Johns is a 82 y.o. male who presents for Medicare Annual/Subsequent preventive examination.  Review of Systems:  No ROS.  Medicare Wellness Visit.   Sleep patterns: {SX; SLEEP PATTERNS:18802::"feels rested on waking","does not get up to void","gets up *** times nightly to void","sleeps *** hours nightly"}.   Home Safety/Smoke Alarms:   Living environment; residence and Firearm Safety: {Rehab home environment / accessibility:30080::"no firearms","firearms stored safely"}. Seat Belt Safety/Bike Helmet: Wears seat belt.   Counseling:   Eye Exam-  Dental-  Male:   CCS- Last 07/06/09: Dr.Mann/ tubular adenoma (external report)  PSA-  Lab Results  Component Value Date   PSA 0.59 10/16/2014   PSA 0.64 06/15/2013   PSA 0.58 03/18/2011        Objective:    Vitals: There were no vitals taken for this visit.  There is no height or weight on file to calculate BMI.  Tobacco History  Smoking Status  . Former Smoker  . Packs/day: 1.00  . Years: 30.00  . Types: Cigarettes  . Quit date: 04/07/1996  Smokeless Tobacco  . Never Used     Counseling given: Not Answered   Past Medical History:  Diagnosis Date  . Arthritis   . Benign prostatic hypertrophy   . COPD (chronic obstructive pulmonary disease) ([place])    24/7 O2  . Diabetes mellitus    per Dr Posey Pronto  . Epidermal cyst of face    left lower lateral cheek  . Gout   . Hyperlipidemia   . Hypertension   . Pulmonary fibrosis ([place]) 2014  . Sleep apnea    Past Surgical History:  Procedure Laterality Date  . TOE SURGERY     Family History  Problem Relation Age of Onset  . Liver disease Mother   . Coronary artery disease Father 68  . Diabetes Sister   . Cancer Brother     type?  Marland Kitchen Hypertension Neg Hx   . Hyperlipidemia Neg Hx   . Heart attack Neg Hx   . Prostate cancer Neg Hx   . Colon cancer Neg Hx    History  Sexual Activity  . Sexual activity: Not on file    Outpatient Encounter  Prescriptions as of 06/17/2016  Medication Sig  . ACCU-CHEK AVIVA PLUS test strip Reported on 06/20/2015  . ACCU-CHEK SOFTCLIX LANCETS lancets Reported on 06/20/2015  . amLODipine (NORVASC) 10 MG tablet Take 1 tablet (10 mg total) by mouth daily.  Marland Kitchen aspirin 81 MG tablet Take 81 mg by mouth daily.    Marland Kitchen atorvastatin (LIPITOR) 80 MG tablet Take 1 tablet (80 mg total) by mouth daily.  . B-D UF III MINI PEN NEEDLES 31G X 5 MM MISC Reported on 06/20/2015  . Blood Glucose Monitoring Suppl (ACCU-CHEK AVIVA PLUS) W/DEVICE KIT Reported on 06/20/2015  . carvedilol (COREG) 12.5 MG tablet Take 1 tablet (12.5 mg total) by mouth 2 (two) times daily with a meal.  . colchicine 0.6 MG tablet Take 1 tablet (0.6 mg total) by mouth daily as needed.  . doxazosin (CARDURA) 4 MG tablet Take 1 tablet (4 mg total) by mouth at bedtime.  . Insulin Lispro Prot & Lispro (HUMALOG MIX 75/25 PEN [place]) Inject 30 Units into the skin daily. Reported on 06/19/2015  . ketoconazole (NIZORAL) 2 % cream Apply 1 application topically daily. (Patient not taking: Reported on 12/21/2015)  . losartan (COZAAR) 50 MG tablet Take 1 tablet (50 mg total) by mouth daily.  . metFORMIN (GLUCOPHAGE)  500 MG tablet Take 500 mg by mouth 2 (two) times daily with a meal.    . Multiple Vitamin (MULTIVITAMIN) tablet Take 1 tablet by mouth daily.    . OXYGEN Inhale into the lungs. 3L Nocturnal and PRN for Hypoxemia  . pioglitazone (ACTOS) 45 MG tablet Take 45 mg by mouth daily.   No facility-administered encounter medications on file as of 06/17/2016.     Activities of Daily Living In your present state of health, do you have any difficulty performing the following activities: 12/21/2015 06/20/2015  Hearing? N N  Vision? N N  Difficulty concentrating or making decisions? N N  Walking or climbing stairs? N N  Dressing or bathing? N N  Doing errands, shopping? N N  Some recent data might be hidden    Patient Care Team: Colon Branch, MD as PCP -  General Rigoberto Noel, MD as Consulting Physician (Pulmonary Disease) Amalia Greenhouse, MD as Referring Physician (Endocrinology) Melida Quitter, MD as Consulting Physician (Otolaryngology)   Assessment:    Physical assessment deferred to PCP.  Exercise Activities and Dietary recommendations    Diet (meal preparation, eat out, water intake, caffeinated beverages, dairy products, fruits and vegetables): {Desc; diets:16563} Breakfast: Lunch:  Dinner:      Goals    None     Fall Risk Fall Risk  12/21/2015 06/20/2015 07/04/2014 06/15/2013 04/06/2012  Falls in the past year? No No No No No   Depression Screen PHQ 2/9 Scores 12/21/2015 06/20/2015 07/04/2014 06/15/2013  PHQ - 2 Score 0 0 0 0    Cognitive Function        Immunization History  Administered Date(s) Administered  . Influenza Split 03/18/2011  . Influenza Whole 01/10/2009, 01/11/2010  . Influenza, High Dose Seasonal PF 02/14/2013, 12/29/2014, 12/21/2015  . Influenza, Seasonal, Injecte, Preservative Fre 04/06/2012  . Influenza,inj,Quad PF,36+ Mos 02/16/2014  . Pneumococcal Conjugate-13 12/29/2014  . Pneumococcal Polysaccharide-23 10/20/1999, 07/11/2008, 06/15/2013  . Td 04/10/1999, 01/11/2010   Screening Tests Health Maintenance  Topic Date Due  . FOOT EXAM  10/[birth year]/1946  . HEMOGLOBIN A1C  04/08/2007  . OPHTHALMOLOGY EXAM  10/14/2014  . TETANUS/TDAP  01/12/2020  . INFLUENZA VACCINE  Completed  . PNA vac Low Risk Adult  Completed      Plan:    During the course of the visit the patient was educated and counseled about the following appropriate screening and preventive services:   Vaccines to include Pneumoccal, Influenza, Hepatitis B, Td, HCV  Cardiovascular Disease  Colorectal cancer screening  Diabetes screening  Prostate Cancer Screening  Glaucoma screening  Nutrition counseling   Smoking cessation counseling  Patient Instructions (the written plan) was given to the patient.    Shela Nevin, [place]  06/13/2016

## 2016-06-13 NOTE — Progress Notes (Deleted)
Pre visit review using our clinic review tool, if applicable. No additional management support is needed unless otherwise documented below in the visit note. 

## 2016-06-13 NOTE — Telephone Encounter (Signed)
Scheduled AWV for 06/17/16 @9 .

## 2016-06-16 ENCOUNTER — Telehealth: Payer: Self-pay | Admitting: *Deleted

## 2016-06-16 NOTE — Telephone Encounter (Signed)
Pt made aware office will not be opening until 10am due to weather.

## 2016-06-17 ENCOUNTER — Encounter: Payer: Medicare PPO | Admitting: Internal Medicine

## 2016-06-30 ENCOUNTER — Telehealth: Payer: Self-pay | Admitting: *Deleted

## 2016-06-30 NOTE — Telephone Encounter (Signed)
AWV rescheduled for 09/05/16

## 2016-07-02 DIAGNOSIS — J439 Emphysema, unspecified: Secondary | ICD-10-CM | POA: Diagnosis not present

## 2016-07-02 DIAGNOSIS — J8410 Pulmonary fibrosis, unspecified: Secondary | ICD-10-CM | POA: Diagnosis not present

## 2016-07-04 DIAGNOSIS — J449 Chronic obstructive pulmonary disease, unspecified: Secondary | ICD-10-CM | POA: Diagnosis not present

## 2016-07-16 ENCOUNTER — Ambulatory Visit: Payer: Medicare PPO | Admitting: *Deleted

## 2016-08-02 DIAGNOSIS — J8410 Pulmonary fibrosis, unspecified: Secondary | ICD-10-CM | POA: Diagnosis not present

## 2016-08-02 DIAGNOSIS — J439 Emphysema, unspecified: Secondary | ICD-10-CM | POA: Diagnosis not present

## 2016-08-04 ENCOUNTER — Telehealth: Payer: Self-pay | Admitting: Internal Medicine

## 2016-08-04 DIAGNOSIS — J449 Chronic obstructive pulmonary disease, unspecified: Secondary | ICD-10-CM | POA: Diagnosis not present

## 2016-08-04 NOTE — Telephone Encounter (Signed)
Pt called in to make PCP aware to be looking out for PA from [facility] mail order. Pt says that he has 7 days left on medication.

## 2016-08-04 NOTE — Telephone Encounter (Signed)
For which medication? I have not seen request for PA.

## 2016-08-05 ENCOUNTER — Other Ambulatory Visit: Payer: Self-pay

## 2016-08-05 MED ORDER — DOXAZOSIN MESYLATE 4 MG PO TABS: 4.0000 mg | ORAL_TABLET | Freq: Every day | ORAL | 0 refills | Status: DC

## 2016-08-05 MED ORDER — AMLODIPINE BESYLATE 10 MG PO TABS: 10.0000 mg | ORAL_TABLET | Freq: Every day | ORAL | 0 refills | Status: DC

## 2016-08-05 MED ORDER — LOSARTAN POTASSIUM 50 MG PO TABS: 50.0000 mg | ORAL_TABLET | Freq: Every day | ORAL | 0 refills | Status: DC

## 2016-08-05 MED ORDER — ATORVASTATIN CALCIUM 80 MG PO TABS: 80.0000 mg | ORAL_TABLET | Freq: Every day | ORAL | 0 refills | Status: DC

## 2016-08-05 MED ORDER — CARVEDILOL 12.5 MG PO TABS: 12.5000 mg | ORAL_TABLET | Freq: Two times a day (BID) | ORAL | 0 refills | Status: DC

## 2016-08-06 DIAGNOSIS — Z961 Presence of intraocular lens: Secondary | ICD-10-CM | POA: Diagnosis not present

## 2016-08-06 DIAGNOSIS — H40013 Open angle with borderline findings, low risk, bilateral: Secondary | ICD-10-CM | POA: Diagnosis not present

## 2016-08-06 DIAGNOSIS — H04123 Dry eye syndrome of bilateral lacrimal glands: Secondary | ICD-10-CM | POA: Diagnosis not present

## 2016-08-06 DIAGNOSIS — G4733 Obstructive sleep apnea (adult) (pediatric): Secondary | ICD-10-CM | POA: Diagnosis not present

## 2016-08-06 DIAGNOSIS — H18413 Arcus senilis, bilateral: Secondary | ICD-10-CM | POA: Diagnosis not present

## 2016-08-06 DIAGNOSIS — E113293 Type 2 diabetes mellitus with mild nonproliferative diabetic retinopathy without macular edema, bilateral: Secondary | ICD-10-CM | POA: Diagnosis not present

## 2016-08-06 DIAGNOSIS — H02412 Mechanical ptosis of left eyelid: Secondary | ICD-10-CM | POA: Diagnosis not present

## 2016-08-06 LAB — HM DIABETES EYE EXAM

## 2016-09-01 DIAGNOSIS — J8410 Pulmonary fibrosis, unspecified: Secondary | ICD-10-CM | POA: Diagnosis not present

## 2016-09-01 DIAGNOSIS — J439 Emphysema, unspecified: Secondary | ICD-10-CM | POA: Diagnosis not present

## 2016-09-03 DIAGNOSIS — J449 Chronic obstructive pulmonary disease, unspecified: Secondary | ICD-10-CM | POA: Diagnosis not present

## 2016-09-04 NOTE — Progress Notes (Signed)
Subjective:   Bradley Johns is a 82 y.o. male who presents for Medicare Annual/Subsequent preventive examination.  Review of Systems:  No ROS.  Medicare Wellness Visit.  Cardiac Risk Factors include: advanced age (>63mn, >>55women);diabetes mellitus;dyslipidemia;hypertension;male gender;obesity (BMI >30kg/m2);sedentary lifestyle Sleep patterns: wakes 4-5 x/night to urinate. Goes to bed about 3 am and wakes for the day about 11 am. Feels tired. Drinks regular coffee and has pie every night around 9 pm. Also drinks water throughout the night per pt. Caffeine and sugar effects discussed. Home Safety/Smoke Alarms: Feels safe in home. Smoke alarms in place.  Living environment; residence and Firearm Safety: Lives with wife. 2 story home. No guns. Seat Belt Safety/Bike Helmet: Wears seat belt.   Counseling:   Eye Exam- Wears bifocals. Dr.Rediochenco yearly. Dental- No dentist. Dental resource list provided.  Male:   CCS-    Last 07/06/09: Dr MClent Jacksadenoma  PSA-  Lab Results  Component Value Date   PSA 0.59 10/16/2014   PSA 0.64 06/15/2013   PSA 0.58 03/18/2011       Objective:    Vitals: BP (!) 156/84   Pulse 67   Ht 5' 8" (1.727 m)   Wt 209 lb (94.8 kg)   SpO2 94%   BMI 31.78 kg/m   Body mass index is 31.78 kg/m.  Tobacco History  Smoking Status  . Former Smoker  . Packs/day: 1.00  . Years: 30.00  . Types: Cigarettes  . Quit date: 04/07/1996  Smokeless Tobacco  . Never Used     Counseling given: No   Past Medical History:  Diagnosis Date  . Arthritis   . Benign prostatic hypertrophy   . COPD (chronic obstructive pulmonary disease) ([facility]    24/7 O2  . Diabetes mellitus    per Dr PPosey Pronto . Epidermal cyst of face    left lower lateral cheek  . Gout   . Hyperlipidemia   . Hypertension   [place]  . Sleep apnea    Past Surgical History:  Procedure Laterality Date  . TOE SURGERY     Family History  Problem Relation Age of  Onset  . Liver disease Mother   . Coronary artery disease Father 470 . Diabetes Sister   . Cancer Brother        type?  .Marland KitchenHypertension Neg Hx   . Hyperlipidemia Neg Hx   . Heart attack Neg Hx   . Prostate cancer Neg Hx   . Colon cancer Neg Hx    History  Sexual Activity  . Sexual activity: Yes    Outpatient Encounter Prescriptions as of 09/05/2016  Medication Sig  . ACCU-CHEK AVIVA PLUS test strip Reported on 06/20/2015  . ACCU-CHEK SOFTCLIX LANCETS lancets Reported on 06/20/2015  . amLODipine (NORVASC) 10 MG tablet Take 1 tablet (10 mg total) by mouth daily.  .Marland Kitchenaspirin 81 MG tablet Take 81 mg by mouth daily.    .Marland Kitchenatorvastatin (LIPITOR) 80 MG tablet Take 1 tablet (80 mg total) by mouth daily.  . B-D UF III MINI PEN NEEDLES 31G X 5 MM MISC Reported on 06/20/2015  . Blood Glucose Monitoring Suppl (ACCU-CHEK AVIVA PLUS) W/DEVICE KIT Reported on 06/20/2015  . carvedilol (COREG) 12.5 MG tablet Take 1 tablet (12.5 mg total) by mouth 2 (two) times daily with a meal.  . colchicine 0.6 MG tablet Take 1 tablet (0.6 mg total) by mouth daily as needed.  . doxazosin (CARDURA) 4 MG tablet  Take 1 tablet (4 mg total) by mouth at bedtime.  Marland Kitchen ketoconazole (NIZORAL) 2 % cream Apply 1 application topically daily.  Marland Kitchen losartan (COZAAR) 50 MG tablet Take 1 tablet (50 mg total) by mouth daily.  . metFORMIN (GLUCOPHAGE) 500 MG tablet Take 500 mg by mouth 2 (two) times daily with a meal.    . Multiple Vitamin (MULTIVITAMIN) tablet Take 1 tablet by mouth daily.    . OXYGEN Inhale into the lungs. 3L Nocturnal and PRN for Hypoxemia  . pioglitazone (ACTOS) 45 MG tablet Take 45 mg by mouth daily.  . Insulin Lispro Prot & Lispro (HUMALOG MIX 75/25 PEN [place]) Inject 30 Units into the skin daily. Reported on 06/19/2015   No facility-administered encounter medications on file as of 09/05/2016.     Activities of Daily Living In your present state of health, do you have any difficulty performing the following activities:  09/05/2016 12/21/2015  Hearing? N N  Vision? N N  Difficulty concentrating or making decisions? - N  Walking or climbing stairs? Y N  Dressing or bathing? N N  Doing errands, shopping? N N  Preparing Food and eating ? N -  Using the Toilet? N -  In the past six months, have you accidently leaked urine? N -  Do you have problems with loss of bowel control? N -  Managing your Medications? N -  Managing your Finances? N -  Housekeeping or managing your Housekeeping? N -  Some recent data might be hidden    Patient Care Team: Colon Branch, MD as PCP - General Rigoberto Noel, MD as Consulting Physician (Pulmonary Disease) Amalia Greenhouse, MD as Referring Physician (Endocrinology) Melida Quitter, MD as Consulting Physician (Otolaryngology)   Assessment:    Physical assessment deferred to PCP.  Exercise Activities and Dietary recommendations Current Exercise Habits: The patient does not participate in regular exercise at present   Diet (meal preparation, eat out, water intake, caffeinated beverages, dairy products, fruits and vegetables): in general, an "unhealthy" diet      Goals    . patient states he would just like to be able to return in one year for a visit.      Fall Risk Fall Risk  09/05/2016 12/21/2015 06/20/2015 07/04/2014 06/15/2013  Falls in the past year? _0    Depression Screen PHQ 2/9 Scores 09/05/2016 12/21/2015 06/20/2015 07/04/2014  PHQ - 2 Score 0 0 0 0    Cognitive Function MMSE - Mini Mental State Exam 09/05/2016  Orientation to time 5  Orientation to Place 5  Registration 3  Attention/ Calculation 4  Recall 2  Language- name 2 objects 2  Language- repeat 1  Language- follow 3 step command 3  Language- read & follow direction 1  Write a sentence 1  Copy design 1  Total score 28        Immunization History  Administered Date(s) Administered  . Influenza Split 03/18/2011  . Influenza Whole 01/10/2009, 01/11/2010  . Influenza, High Dose Seasonal PF  02/14/2013, 12/29/2014, 12/21/2015  . Influenza, Seasonal, Injecte, Preservative Fre 04/06/2012  . Influenza,inj,Quad PF,36+ Mos 02/16/2014  . Pneumococcal Conjugate-13 12/29/2014  . Pneumococcal Polysaccharide-23 10/20/1999, 07/11/2008, 06/15/2013  . Td 04/10/1999, 01/11/2010   Screening Tests Health Maintenance  Topic Date Due  . FOOT EXAM  10[birth year]/1946  . HEMOGLOBIN A1C  04/08/2007  . OPHTHALMOLOGY EXAM  10/14/2014  . INFLUENZA VACCINE  11/05/2016  . TETANUS/TDAP  01/12/2020  . PNA vac Low Risk  Adult  Completed      Plan:   Follow up with PCP today as scheduled.  Eat heart healthy diet (full of fruits, vegetables, whole grains, lean protein, water--limit salt, fat, and sugar intake) and increase physical activity as tolerated.  Continue doing brain stimulating activities (puzzles, reading, adult coloring books, staying active) to keep memory sharp.   Bring a copy of your advance directives to your next office visit.  Review diabetes educational handouts and information sent to you via email based on diabetes, nutrition, and sleep.  Reduce and eliminate if possible caffeine an sugar after 6pm.  I have personally reviewed and noted the following in the patient's chart:   . Medical and social history . Use of alcohol, tobacco or illicit drugs  . Current medications and supplements . Functional ability and status . Nutritional status . Physical activity . Advanced directives . List of other physicians . Hospitalizations, surgeries, and ER visits in previous 12 months . Vitals . Screenings to include cognitive, depression, and falls . Referrals and appointments  In addition, I have reviewed and discussed with patient certain preventive protocols, quality metrics, and best practice recommendations. A written personalized care plan for preventive services as well as general preventive health recommendations were provided to patient.     Naaman Plummer [place],  [place]  09/05/2016 Kathlene November, MD

## 2016-09-05 ENCOUNTER — Encounter: Payer: Self-pay | Admitting: Internal Medicine

## 2016-09-05 ENCOUNTER — Ambulatory Visit (INDEPENDENT_AMBULATORY_CARE_PROVIDER_SITE_OTHER): Payer: Medicare PPO | Admitting: Internal Medicine

## 2016-09-05 VITALS — BP 138/68 | HR 67 | Ht 68.0 in | Wt 209.0 lb

## 2016-09-05 DIAGNOSIS — R946 Abnormal results of thyroid function studies: Secondary | ICD-10-CM | POA: Diagnosis not present

## 2016-09-05 DIAGNOSIS — R351 Nocturia: Secondary | ICD-10-CM | POA: Diagnosis not present

## 2016-09-05 DIAGNOSIS — M1A9XX0 Chronic gout, unspecified, without tophus (tophi): Secondary | ICD-10-CM

## 2016-09-05 DIAGNOSIS — E785 Hyperlipidemia, unspecified: Secondary | ICD-10-CM | POA: Diagnosis not present

## 2016-09-05 DIAGNOSIS — Z0000 Encounter for general adult medical examination without abnormal findings: Secondary | ICD-10-CM

## 2016-09-05 DIAGNOSIS — R609 Edema, unspecified: Secondary | ICD-10-CM | POA: Diagnosis not present

## 2016-09-05 DIAGNOSIS — R7989 Other specified abnormal findings of blood chemistry: Secondary | ICD-10-CM

## 2016-09-05 DIAGNOSIS — E119 Type 2 diabetes mellitus without complications: Secondary | ICD-10-CM

## 2016-09-05 DIAGNOSIS — I10 Essential (primary) hypertension: Secondary | ICD-10-CM

## 2016-09-05 LAB — CBC WITH DIFFERENTIAL/PLATELET
Basophils Absolute: 0 10*3/uL (ref 0.0–0.1)
Basophils Relative: 0.3 % (ref 0.0–3.0)
Eosinophils Absolute: 0.2 10*3/uL (ref 0.0–0.7)
Eosinophils Relative: 3.2 % (ref 0.0–5.0)
HCT: 44.1 % (ref 39.0–52.0)
Hemoglobin: 14.6 g/dL (ref 13.0–17.0)
Lymphocytes Relative: 27 % (ref 12.0–46.0)
Lymphs Abs: 1.7 10*3/uL (ref 0.7–4.0)
MCHC: 33 g/dL (ref 30.0–36.0)
MCV: 96.1 fl (ref 78.0–100.0)
Monocytes Absolute: 0.6 10*3/uL (ref 0.1–1.0)
Monocytes Relative: 9.4 % (ref 3.0–12.0)
Neutro Abs: 3.8 10*3/uL (ref 1.4–7.7)
Neutrophils Relative %: 60.1 % (ref 43.0–77.0)
Platelets: 160 10*3/uL (ref 150.0–400.0)
RBC: 4.59 Mil/uL (ref 4.22–5.81)
RDW: 13.9 % (ref 11.5–15.5)
WBC: 6.4 10*3/uL (ref 4.0–10.5)

## 2016-09-05 LAB — BASIC METABOLIC PANEL
BUN: 18 mg/dL (ref 6–23)
CO2: 28 mEq/L (ref 19–32)
Calcium: 9.4 mg/dL (ref 8.4–10.5)
Chloride: 103 mEq/L (ref 96–112)
Creatinine, Ser: 1.07 mg/dL (ref 0.40–1.50)
GFR: 85.54 mL/min (ref >60.00)
Glucose, Bld: 110 mg/dL — ABNORMAL HIGH (ref 70–99)
Potassium: 4.4 mEq/L (ref 3.5–5.1)
Sodium: 140 mEq/L (ref 135–145)

## 2016-09-05 LAB — T3, FREE: T3, Free: 3.3 pg/mL (ref 2.3–4.2)

## 2016-09-05 LAB — T4, FREE: Free T4: 0.91 ng/dL (ref 0.60–1.60)

## 2016-09-05 LAB — TSH: TSH: 6.12 u[IU]/mL — ABNORMAL HIGH (ref 0.35–4.50)

## 2016-09-05 NOTE — Progress Notes (Signed)
Subjective:    Patient ID: Bradley Johns, male    DOB: 21-Jun-1934, 82 y.o.   MRN: [id number]  DOS:  09/05/2016 Type of visit - description : cpx Interval history: Good med compliance . Continue with nocturia, this is a chronic issue, reports that he drinks coffee all day and before bed and takes water before bedtime. No dysuria, gross hematuria or incontinence. Have a gout episode 2 weeks ago but the episodes are really infrequent. Has noted some edema at the pretibial area. BP was elevated today. At home is checking frequently but he was 140 the last time he checked.  Review of Systems  Other than above, a 14 point review of systems is negative     Past Medical History:  Diagnosis Date  . Arthritis   . Benign prostatic hypertrophy   . COPD (chronic obstructive pulmonary disease) ([place])    24/7 O2  . Diabetes mellitus    per Dr Posey Pronto  . Epidermal cyst of face    left lower lateral cheek  . Gout   . Hyperlipidemia   . Hypertension   . Pulmonary fibrosis ([place]) 2014  . Sleep apnea     Past Surgical History:  Procedure Laterality Date  . TOE SURGERY      Social History   Social History  . Marital status: Married    Spouse name: N/A  . Number of children: 2  . Years of education: N/A   Occupational History  . retired    Social History Main Topics  . Smoking status: Former Smoker    Packs/day: 1.00    Years: 30.00    Types: Cigarettes    Quit date: 04/07/1996  . Smokeless tobacco: Never Used  . Alcohol use Yes     Comment: occassional beer  . Drug use: No  . Sexual activity: Yes   Other Topics Concern  . Not on file   Social History Narrative   Lives w/ wife     Family History  Problem Relation Age of Onset  . Liver disease Mother   . Coronary artery disease Father 27  . Diabetes Sister   . Cancer Brother        type?  Marland Kitchen Hypertension Neg Hx   . Hyperlipidemia Neg Hx   . Heart attack Neg Hx   . Prostate cancer Neg Hx   . Colon cancer Neg Hx      Allergies as of 09/05/2016   No Known Allergies     Medication List       Accurate as of 09/05/16  5:36 PM. Always use your most recent med list.          ACCU-CHEK AVIVA PLUS test strip Generic drug:  glucose blood Reported on 06/20/2015   ACCU-CHEK AVIVA PLUS w/Device Kit Reported on 06/20/2015   ACCU-CHEK SOFTCLIX LANCETS lancets Reported on 06/20/2015   amLODipine 10 MG tablet Commonly known as:  NORVASC Take 1 tablet (10 mg total) by mouth daily.   aspirin 81 MG tablet Take 81 mg by mouth daily.   atorvastatin 80 MG tablet Commonly known as:  LIPITOR Take 1 tablet (80 mg total) by mouth daily.   B-D UF III MINI PEN NEEDLES 31G X 5 MM Misc Generic drug:  Insulin Pen Needle Reported on 06/20/2015   carvedilol 12.5 MG tablet Commonly known as:  COREG Take 1 tablet (12.5 mg total) by mouth 2 (two) times daily with a meal.   colchicine 0.6 MG tablet  Take 1 tablet (0.6 mg total) by mouth daily as needed.   doxazosin 4 MG tablet Commonly known as:  CARDURA Take 1 tablet (4 mg total) by mouth at bedtime.   HUMALOG MIX 75/25 PEN [place] Inject 30 Units into the skin daily. Reported on 06/19/2015   ketoconazole 2 % cream Commonly known as:  NIZORAL Apply 1 application topically daily.   losartan 50 MG tablet Commonly known as:  COZAAR Take 1 tablet (50 mg total) by mouth daily.   metFORMIN 500 MG tablet Commonly known as:  GLUCOPHAGE Take 500 mg by mouth 2 (two) times daily with a meal.   multivitamin tablet Take 1 tablet by mouth daily.   OXYGEN Inhale into the lungs. 3L Nocturnal and PRN for Hypoxemia   pioglitazone 45 MG tablet Commonly known as:  ACTOS Take 45 mg by mouth daily.          Objective:   Physical Exam BP 138/68 (BP Location: Left Arm, Patient Position: Sitting, Cuff Size: Normal)   Pulse 67   Ht _0  (1.727 m)   Wt 209 lb (94.8 kg)   SpO2 94%   BMI 31.78 kg/m   General:   Well developed, well nourished . NAD.  Neck: No   thyromegaly  HEENT:  Normocephalic . Face symmetric, atraumatic Lungs:  CTA B Normal respiratory effort, no intercostal retractions, no accessory muscle use. Heart: RRR,  no murmur.  +/+++  pretibial edema bilaterally  Abdomen:  Not distended, soft, non-tender. No rebound or rigidity.   Skin: Exposed areas without rash. Not pale. Not jaundice Neurologic:  alert & oriented X3.  Speech normal, gait appropriate for age and unassisted Strength symmetric and appropriate for age.  Psych: Cognition and judgment appear intact.  Cooperative with normal attention span and concentration.  Behavior appropriate. No anxious or depressed appearing.    Assessment & Plan:   Assessment  DM Dr. Posey Pronto  HTN Hyperlipidemia DJD-- rarely uses hydrocodone Gout -- occ colchicine PULM: --COPD on oxygen qhs, rec O2 24/7 on 06-2015 --OSA - no compliant w/ cpap --Pulmonary fibrosis BPH (Nocturia), has not seen urology, last PSA DRE  2016 wnl, myrbetriq -no help    PLAN  Labs 04-2016: CMP okay, LDL 66. ( care everywhere) DM: Under excellent control per endocrinology Hyperlipidemia: LDL few months ago was 66. Continue Lipitor HTN: Currently on amlodipine, carvedilol, Cardura, losartan. BP upon arrival was elevated, we check again and is 138/68. No change, monitor amb BPs Gout: Has episodes infrequently, will call for a refill on colchicine prn BPH, nocturia: Ongoing issue, recommend to avoid caffeine beverages and excessive water late during the day. Increased TSH: Check TFTs. Edema: Likely multifactorial, recommend low salt diet and leg elevation. If edema become more of an issue will have to stop amlodipine. Also complaining of dry skin: Recommend Aveeno and avoid hot showers RTC 6 months.

## 2016-09-05 NOTE — Assessment & Plan Note (Signed)
Labs 04-2016: CMP okay, LDL 66. ( care everywhere) DM: Under excellent control per endocrinology Hyperlipidemia: LDL few months ago was 66. Continue Lipitor HTN: Currently on amlodipine, carvedilol, Cardura, losartan. BP upon arrival was elevated, we check again and is 138/68. No change, monitor amb BPs Gout: Has episodes infrequently, will call for a refill on colchicine prn BPH, nocturia: Ongoing issue, recommend to avoid caffeine beverages and excessive water late during the day. Increased TSH: Check TFTs. Edema: Likely multifactorial, recommend low salt diet and leg elevation. If edema become more of an issue will have to stop amlodipine. Also complaining of dry skin: Recommend Aveeno and avoid hot showers RTC 6 months.

## 2016-09-05 NOTE — Patient Instructions (Addendum)
GO TO THE LAB : Get the blood work     GO TO THE FRONT DESK Schedule your next appointment for a  checkup in 6 months  Check the  blood pressure 2 or 3 times a   week   Be sure your blood pressure is between 110/65 and  145/85. If it is consistently higher or lower, let me know    Bradley Johns , Thank you for taking time to come for your Medicare Wellness Visit. I appreciate your ongoing commitment to your health goals. Please review the following plan we discussed and let me know if I can assist you in the future.   These are the goals we discussed: Goals    . patient states he would just like to be able to return in one year for a visit.       This is a list of the screening recommended for you and due dates:  Health Maintenance  Topic Date Due  . Complete foot exam   10[birth year]08/1946  . Hemoglobin A1C  04/08/2007  . Eye exam for diabetics  10/14/2014  . Flu Shot  11/05/2016  . Tetanus Vaccine  01/12/2020  . Pneumonia vaccines  Completed   Eat heart healthy diet (full of fruits, vegetables, whole grains, lean protein, water--limit salt, fat, and sugar intake) and increase physical activity as tolerated.  Continue doing brain stimulating activities (puzzles, reading, adult coloring books, staying active) to keep memory sharp.   Bring a copy of your advance directives to your next office visit.  Review diabetes educational handouts and information sent to you via email based on diabetes, nutrition, and sleep.  Reduce and eliminate if possible caffeine an sugar after 6pm.     Health Maintenance, Male A healthy lifestyle and preventive care is important for your health and wellness. Ask your health care provider about what schedule of regular examinations is right for you. What should I know about weight and diet? Eat a Healthy Diet  Eat plenty of vegetables, fruits, whole grains, low-fat dairy products, and lean protein.  Do not eat a lot of foods high in solid fats, added  sugars, or salt.  Maintain a Healthy Weight Regular exercise can help you achieve or maintain a healthy weight. You should:  Do at least 150 minutes of exercise each week. The exercise should increase your heart rate and make you sweat (moderate-intensity exercise).  Do strength-training exercises at least twice a week.  Watch Your Levels of Cholesterol and Blood Lipids  Have your blood tested for lipids and cholesterol every 5 years starting at 80 years of age. If you are at high risk for heart disease, you should start having your blood tested when you are 80 years old. You may need to have your cholesterol levels checked more often if: ? Your lipid or cholesterol levels are high. ? You are older than 80 years of age. ? You are at high risk for heart disease.  What should I know about cancer screening? Many types of cancers can be detected early and may often be prevented. Lung Cancer  You should be screened every year for lung cancer if: ? You are a current smoker who has smoked for at least 30 years. ? You are a former smoker who has quit within the past 15 years.  Talk to your health care provider about your screening options, when you should start screening, and how often you should be screened.  Colorectal Cancer  Routine colorectal  cancer screening usually begins at 80 years of age and should be repeated every 5-10 years until you are 80 years old. You may need to be screened more often if early forms of precancerous polyps or small growths are found. Your health care provider may recommend screening at an earlier age if you have risk factors for colon cancer.  Your health care provider may recommend using home test kits to check for hidden blood in the stool.  A small camera at the end of a tube can be used to examine your colon (sigmoidoscopy or colonoscopy). This checks for the earliest forms of colorectal cancer.  Prostate and Testicular Cancer  Depending on your age  and overall health, your health care provider may do certain tests to screen for prostate and testicular cancer.  Talk to your health care provider about any symptoms or concerns you have about testicular or prostate cancer.  Skin Cancer  Check your skin from head to toe regularly.  Tell your health care provider about any new moles or changes in moles, especially if: ? There is a change in a mole's size, shape, or color. ? You have a mole that is larger than a pencil eraser.  Always use sunscreen. Apply sunscreen liberally and repeat throughout the day.  Protect yourself by wearing long sleeves, pants, a wide-brimmed hat, and sunglasses when outside.  What should I know about heart disease, diabetes, and high blood pressure?  If you are 95-60 years of age, have your blood pressure checked every 3-5 years. If you are 82 years of age or older, have your blood pressure checked every year. You should have your blood pressure measured twice-once when you are at a hospital or clinic, and once when you are not at a hospital or clinic. Record the average of the two measurements. To check your blood pressure when you are not at a hospital or clinic, you can use: ? An automated blood pressure machine at a pharmacy. ? A home blood pressure monitor.  Talk to your health care provider about your target blood pressure.  If you are between 60-78 years old, ask your health care provider if you should take aspirin to prevent heart disease.  Have regular diabetes screenings by checking your fasting blood sugar level. ? If you are at a normal weight and have a low risk for diabetes, have this test once every three years after the age of 64. ? If you are overweight and have a high risk for diabetes, consider being tested at a younger age or more often.  A one-time screening for abdominal aortic aneurysm (AAA) by ultrasound is recommended for men aged 64-75 years who are current or former smokers. What  should I know about preventing infection? Hepatitis B If you have a higher risk for hepatitis B, you should be screened for this virus. Talk with your health care provider to find out if you are at risk for hepatitis B infection. Hepatitis C Blood testing is recommended for:  Everyone born from [birth year] through 1963.  Anyone with known risk factors for hepatitis C.  Sexually Transmitted Diseases (STDs)  You should be screened each year for STDs including gonorrhea and chlamydia if: ? You are sexually active and are younger than 80 years of age. ? You are older than 80 years of age and your health care provider tells you that you are at risk for this type of infection. ? Your sexual activity has changed since you were last  screened and you are at an increased risk for chlamydia or gonorrhea. Ask your health care provider if you are at risk.  Talk with your health care provider about whether you are at high risk of being infected with HIV. Your health care provider may recommend a prescription medicine to help prevent HIV infection.  What else can I do?  Schedule regular health, dental, and eye exams.  Stay current with your vaccines (immunizations).  Do not use any tobacco products, such as cigarettes, chewing tobacco, and e-cigarettes. If you need help quitting, ask your health care provider.  Limit alcohol intake to no more than 2 drinks per day. One drink equals 12 ounces of beer, 5 ounces of wine, or 1 ounces of hard liquor.  Do not use street drugs.  Do not share needles.  Ask your health care provider for help if you need support or information about quitting drugs.  Tell your health care provider if you often feel depressed.  Tell your health care provider if you have ever been abused or do not feel safe at home. This information is not intended to replace advice given to you by your health care provider. Make sure you discuss any questions you have with your health care  provider. Document Released: 09/20/2007 Document Revised: 11/21/2015 Document Reviewed: 12/26/2014 Elsevier Interactive Patient Education  [employer].

## 2016-09-05 NOTE — Assessment & Plan Note (Signed)
--  Td 2011; pneumonia shot 2011 ;prevnar provided 12-2014; declined again a shingles shot  --CCS:  Had a Cscope w/ Drr Collene Mares before, repeated  Cscope 07-25-09 (d/t a  + hemocult)---- poor prep, biopsies were done. GI recommended six-month follow-up colonoscopy with a five-day prep. >>>>>>  Today he states is still reluctant to to proceed  ---Prostate cancer screening: PSAs normal, screen no longer indicated. --Diet-exercise discussed

## 2016-10-02 DIAGNOSIS — J8410 Pulmonary fibrosis, unspecified: Secondary | ICD-10-CM | POA: Diagnosis not present

## 2016-10-02 DIAGNOSIS — J439 Emphysema, unspecified: Secondary | ICD-10-CM | POA: Diagnosis not present

## 2016-10-04 DIAGNOSIS — J449 Chronic obstructive pulmonary disease, unspecified: Secondary | ICD-10-CM | POA: Diagnosis not present

## 2016-10-07 ENCOUNTER — Other Ambulatory Visit: Payer: Self-pay | Admitting: Internal Medicine

## 2016-10-22 DIAGNOSIS — I10 Essential (primary) hypertension: Secondary | ICD-10-CM | POA: Diagnosis not present

## 2016-10-22 DIAGNOSIS — E119 Type 2 diabetes mellitus without complications: Secondary | ICD-10-CM | POA: Diagnosis not present

## 2016-10-22 DIAGNOSIS — E7800 Pure hypercholesterolemia, unspecified: Secondary | ICD-10-CM | POA: Diagnosis not present

## 2016-11-01 DIAGNOSIS — J439 Emphysema, unspecified: Secondary | ICD-10-CM | POA: Diagnosis not present

## 2016-11-01 DIAGNOSIS — J8410 Pulmonary fibrosis, unspecified: Secondary | ICD-10-CM | POA: Diagnosis not present

## 2016-11-03 ENCOUNTER — Telehealth: Payer: Self-pay | Admitting: Internal Medicine

## 2016-11-03 DIAGNOSIS — G4730 Sleep apnea, unspecified: Secondary | ICD-10-CM

## 2016-11-03 DIAGNOSIS — J449 Chronic obstructive pulmonary disease, unspecified: Secondary | ICD-10-CM | POA: Diagnosis not present

## 2016-11-03 NOTE — Telephone Encounter (Signed)
Pt would like a urgent referral to his Pulmonologist  - Bradley Johns

## 2016-11-03 NOTE — Telephone Encounter (Signed)
Referral placed.

## 2016-11-19 DIAGNOSIS — G4733 Obstructive sleep apnea (adult) (pediatric): Secondary | ICD-10-CM | POA: Diagnosis not present

## 2016-11-19 DIAGNOSIS — J449 Chronic obstructive pulmonary disease, unspecified: Secondary | ICD-10-CM | POA: Diagnosis not present

## 2016-11-19 DIAGNOSIS — I509 Heart failure, unspecified: Secondary | ICD-10-CM | POA: Diagnosis not present

## 2016-11-19 DIAGNOSIS — J8410 Pulmonary fibrosis, unspecified: Secondary | ICD-10-CM | POA: Diagnosis not present

## 2016-11-19 DIAGNOSIS — R0902 Hypoxemia: Secondary | ICD-10-CM | POA: Diagnosis not present

## 2016-11-19 DIAGNOSIS — R0602 Shortness of breath: Secondary | ICD-10-CM | POA: Diagnosis not present

## 2016-11-26 DIAGNOSIS — J449 Chronic obstructive pulmonary disease, unspecified: Secondary | ICD-10-CM | POA: Diagnosis not present

## 2016-11-26 DIAGNOSIS — J8410 Pulmonary fibrosis, unspecified: Secondary | ICD-10-CM | POA: Diagnosis not present

## 2016-12-02 DIAGNOSIS — J439 Emphysema, unspecified: Secondary | ICD-10-CM | POA: Diagnosis not present

## 2016-12-02 DIAGNOSIS — J8410 Pulmonary fibrosis, unspecified: Secondary | ICD-10-CM | POA: Diagnosis not present

## 2016-12-04 DIAGNOSIS — J449 Chronic obstructive pulmonary disease, unspecified: Secondary | ICD-10-CM | POA: Diagnosis not present

## 2017-01-02 DIAGNOSIS — J439 Emphysema, unspecified: Secondary | ICD-10-CM | POA: Diagnosis not present

## 2017-01-02 DIAGNOSIS — J8410 Pulmonary fibrosis, unspecified: Secondary | ICD-10-CM | POA: Diagnosis not present

## 2017-01-04 DIAGNOSIS — J449 Chronic obstructive pulmonary disease, unspecified: Secondary | ICD-10-CM | POA: Diagnosis not present

## 2017-01-08 ENCOUNTER — Other Ambulatory Visit: Payer: Self-pay | Admitting: Internal Medicine

## 2017-02-01 DIAGNOSIS — J439 Emphysema, unspecified: Secondary | ICD-10-CM | POA: Diagnosis not present

## 2017-02-01 DIAGNOSIS — J8410 Pulmonary fibrosis, unspecified: Secondary | ICD-10-CM | POA: Diagnosis not present

## 2017-02-03 DIAGNOSIS — J449 Chronic obstructive pulmonary disease, unspecified: Secondary | ICD-10-CM | POA: Diagnosis not present

## 2017-02-11 ENCOUNTER — Ambulatory Visit: Payer: Medicare PPO

## 2017-03-04 DIAGNOSIS — J439 Emphysema, unspecified: Secondary | ICD-10-CM | POA: Diagnosis not present

## 2017-03-04 DIAGNOSIS — J8410 Pulmonary fibrosis, unspecified: Secondary | ICD-10-CM | POA: Diagnosis not present

## 2017-03-06 DIAGNOSIS — J449 Chronic obstructive pulmonary disease, unspecified: Secondary | ICD-10-CM | POA: Diagnosis not present

## 2017-03-10 ENCOUNTER — Ambulatory Visit: Payer: Medicare PPO

## 2017-03-10 ENCOUNTER — Encounter: Payer: Self-pay | Admitting: Internal Medicine

## 2017-03-10 ENCOUNTER — Ambulatory Visit: Payer: Medicare PPO | Admitting: Internal Medicine

## 2017-03-10 VITALS — BP 134/68 | HR 63 | Temp 98.1°F | Resp 14 | Ht 68.0 in | Wt 208.4 lb

## 2017-03-10 DIAGNOSIS — I10 Essential (primary) hypertension: Secondary | ICD-10-CM | POA: Diagnosis not present

## 2017-03-10 DIAGNOSIS — R7989 Other specified abnormal findings of blood chemistry: Secondary | ICD-10-CM | POA: Diagnosis not present

## 2017-03-10 DIAGNOSIS — Z23 Encounter for immunization: Secondary | ICD-10-CM | POA: Diagnosis not present

## 2017-03-10 DIAGNOSIS — E785 Hyperlipidemia, unspecified: Secondary | ICD-10-CM | POA: Diagnosis not present

## 2017-03-10 DIAGNOSIS — J8410 Pulmonary fibrosis, unspecified: Secondary | ICD-10-CM | POA: Diagnosis not present

## 2017-03-10 LAB — T4, FREE: Free T4: 0.94 ng/dL (ref 0.60–1.60)

## 2017-03-10 LAB — LIPID PANEL
Cholesterol: 142 mg/dL (ref 0–200)
HDL: 48.2 mg/dL (ref >39.00)
LDL Cholesterol: 76 mg/dL (ref 0–99)
NonHDL: 93.46
Total CHOL/HDL Ratio: 3
Triglycerides: 87 mg/dL (ref 0.0–149.0)
VLDL: 17.4 mg/dL (ref 0.0–40.0)

## 2017-03-10 LAB — COMPREHENSIVE METABOLIC PANEL
ALT: 12 U/L (ref 0–53)
AST: 21 U/L (ref 0–37)
Albumin: 4.3 g/dL (ref 3.5–5.2)
Alkaline Phosphatase: 59 U/L (ref 39–117)
BUN: 18 mg/dL (ref 6–23)
CO2: 32 mEq/L (ref 19–32)
Calcium: 9.2 mg/dL (ref 8.4–10.5)
Chloride: 102 mEq/L (ref 96–112)
Creatinine, Ser: 1.15 mg/dL (ref 0.40–1.50)
GFR: 78.61 mL/min (ref >60.00)
Glucose, Bld: 118 mg/dL — ABNORMAL HIGH (ref 70–99)
Potassium: 4.4 mEq/L (ref 3.5–5.1)
Sodium: 140 mEq/L (ref 135–145)
Total Bilirubin: 1 mg/dL (ref 0.2–1.2)
Total Protein: 7.5 g/dL (ref 6.0–8.3)

## 2017-03-10 LAB — TSH: TSH: 5.75 u[IU]/mL — ABNORMAL HIGH (ref 0.35–4.50)

## 2017-03-10 LAB — T3, FREE: T3, Free: 3.4 pg/mL (ref 2.3–4.2)

## 2017-03-10 NOTE — Progress Notes (Signed)
Subjective:    Patient ID: Bradley Johns, male    DOB: Jan 11, 1935, 82 y.o.   MRN: [id number]  DOS:  03/10/2017 Type of visit - description : rov Interval history: Pulmonary fibrosis: 4 months ago was seen by his pulmonary doctor, had shortness of breath and low oxygen, based on a BNP and a chest x-ray was prescribed Lasix for some evidence of CHF. Was rx  pulmonary rehab. O2 sat was 91%, 3 L at rest Since then, he feels well, shortness of breath at baseline.  Uses oxygen 24/7 HTN: Good compliance with medication, ambulatory BPs in the 130s. Increased TSH: Due for labs   Review of Systems Denies fever chills No chest pain, no difficulty with edema No cough   Past Medical History:  Diagnosis Date  . Arthritis   . Benign prostatic hypertrophy   . COPD (chronic obstructive pulmonary disease) ([place])    24/7 O2  . Diabetes mellitus    per Dr Posey Pronto  . Epidermal cyst of face    left lower lateral cheek  . Gout   . Hyperlipidemia   . Hypertension   . Pulmonary fibrosis ([place]) 2014  . Sleep apnea     Past Surgical History:  Procedure Laterality Date  . TOE SURGERY      Social History   Socioeconomic History  . Marital status: Married    Spouse name: Not on file  . Number of children: 2  . Years of education: Not on file  . Highest education level: Not on file  Social Needs  . Financial resource strain: Not on file  . Food insecurity - worry: Not on file  . Food insecurity - inability: Not on file  . Transportation needs - medical: Not on file  . Transportation needs - non-medical: Not on file  Occupational History  . Occupation: retired  Tobacco Use  . Smoking status: Former Smoker    Packs/day: 1.00    Years: 30.00    Pack years: 30.00    Types: Cigarettes    Last attempt to quit: 04/07/1996    Years since quitting: 20.9  . Smokeless tobacco: Never Used  Substance and Sexual Activity  . Alcohol use: Yes    Comment: occassional beer  . Drug use: No  . Sexual  activity: Yes  Other Topics Concern  . Not on file  Social History Narrative   Lives w/ wife      Allergies as of 03/10/2017   No Known Allergies     Medication List        Accurate as of 03/10/17 11:59 PM. Always use your most recent med list.          ACCU-CHEK AVIVA PLUS test strip Generic drug:  glucose blood Reported on 06/20/2015   ACCU-CHEK AVIVA PLUS w/Device Kit Reported on 06/20/2015   ACCU-CHEK SOFTCLIX LANCETS lancets Reported on 06/20/2015   amLODipine 10 MG tablet Commonly known as:  NORVASC Take 1 tablet (10 mg total) by mouth daily.   aspirin 81 MG tablet Take 81 mg by mouth daily.   atorvastatin 80 MG tablet Commonly known as:  LIPITOR Take 1 tablet (80 mg total) by mouth daily.   B-D UF III MINI PEN NEEDLES 31G X 5 MM Misc Generic drug:  Insulin Pen Needle Reported on 06/20/2015   carvedilol 12.5 MG tablet Commonly known as:  COREG Take 1 tablet (12.5 mg total) by mouth 2 (two) times daily with a meal.   colchicine 0.6  MG tablet Take 1 tablet (0.6 mg total) by mouth daily as needed.   doxazosin 4 MG tablet Commonly known as:  CARDURA Take 1 tablet (4 mg total) by mouth at bedtime.   ketoconazole 2 % cream Commonly known as:  NIZORAL Apply 1 application topically daily.   losartan 50 MG tablet Commonly known as:  COZAAR Take 1 tablet (50 mg total) by mouth daily.   metFORMIN 500 MG tablet Commonly known as:  GLUCOPHAGE Take 500 mg by mouth 2 (two) times daily with a meal.   multivitamin tablet Take 1 tablet by mouth daily.   OXYGEN Inhale into the lungs. 3L Nocturnal and PRN for Hypoxemia   pioglitazone 45 MG tablet Commonly known as:  ACTOS Take 45 mg by mouth daily.          Objective:   Physical Exam BP 134/68 (BP Location: Left Arm, Patient Position: Sitting, Cuff Size: Normal)   Pulse 63   Temp 98.1 F (36.7 C) (Oral)   Resp 14   Ht 5' 8" (1.727 m)   Wt 208 lb 6 oz (94.5 kg)   SpO2 90%   BMI 31.68 kg/m    General:   Well developed, well nourished . NAD.  HEENT:  Normocephalic . Face symmetric, atraumatic Lungs:  Decreased breath sounds, very  few dry crackles at bases. Normal respiratory effort, no intercostal retractions, no accessory muscle use. Heart: RRR,  no murmur.  No pretibial edema bilaterally  Skin: Not pale. Not jaundice Neurologic:  alert & oriented X3.  Speech normal, gait appropriate for age and unassisted Psych--  Cognition and judgment appear intact.  Cooperative with normal attention span and concentration.  Behavior appropriate. No anxious or depressed appearing.      Assessment & Plan:   Assessment  DM Dr. Posey Pronto  HTN Hyperlipidemia DJD-- rarely uses hydrocodone Gout -- occ colchicine PULM: --COPD on oxygen qhs, rec O2 24/7 on 06-2015 --OSA - no compliant w/ cpap --Pulmonary fibrosis BPH (Nocturia), has not seen urology, last PSA DRE  2016 wnl, myrbetriq -no help    PLAN  DM: Per Dr. Posey Pronto HTN: Seems well controlled on amlodipine, carvedilol, Cardura, losartan.  Check CMP High cholesterol: On Lipitor, check a FLP Slightly elevated TSH: Recheck TFTs Pulmonary fibrosis: On oxygen 24/7.  Today on 2 L of oxygen O2 sat 90%, decreased to 86 with minimal exertion.  At home O2 sat ranged from 88-94 % on 3 L of oxygen.  Rec  to call pulmonary if increase sx or decreased saturations. Flu shot today RTC 6 months

## 2017-03-10 NOTE — Progress Notes (Signed)
Pre visit review using our clinic review tool, if applicable. No additional management support is needed unless otherwise documented below in the visit note. 

## 2017-03-10 NOTE — Patient Instructions (Signed)
GO TO THE LAB : Get the blood work     GO TO THE FRONT DESK Schedule your next appointment for a  physical exam in 6 months  

## 2017-03-11 NOTE — Assessment & Plan Note (Signed)
DM: Per Dr. Posey Pronto HTN: Seems well controlled on amlodipine, carvedilol, Cardura, losartan.  Check CMP High cholesterol: On Lipitor, check a FLP Slightly elevated TSH: Recheck TFTs Pulmonary fibrosis: On oxygen 24/7.  Today on 2 L of oxygen O2 sat 90%, decreased to 86 with minimal exertion.  At home O2 sat ranged from 88-94 % on 3 L of oxygen.  Rec  to call pulmonary if increase sx or decreased saturations. Flu shot today RTC 6 months

## 2017-04-03 DIAGNOSIS — J439 Emphysema, unspecified: Secondary | ICD-10-CM | POA: Diagnosis not present

## 2017-04-03 DIAGNOSIS — J8410 Pulmonary fibrosis, unspecified: Secondary | ICD-10-CM | POA: Diagnosis not present

## 2017-04-05 DIAGNOSIS — J449 Chronic obstructive pulmonary disease, unspecified: Secondary | ICD-10-CM | POA: Diagnosis not present

## 2017-04-14 ENCOUNTER — Other Ambulatory Visit: Payer: Self-pay | Admitting: Internal Medicine

## 2017-04-22 DIAGNOSIS — H02412 Mechanical ptosis of left eyelid: Secondary | ICD-10-CM | POA: Diagnosis not present

## 2017-04-22 DIAGNOSIS — H5231 Anisometropia: Secondary | ICD-10-CM | POA: Diagnosis not present

## 2017-04-22 DIAGNOSIS — H04123 Dry eye syndrome of bilateral lacrimal glands: Secondary | ICD-10-CM | POA: Diagnosis not present

## 2017-04-22 DIAGNOSIS — G4733 Obstructive sleep apnea (adult) (pediatric): Secondary | ICD-10-CM | POA: Diagnosis not present

## 2017-04-22 DIAGNOSIS — H40013 Open angle with borderline findings, low risk, bilateral: Secondary | ICD-10-CM | POA: Diagnosis not present

## 2017-04-22 LAB — HM DIABETES EYE EXAM

## 2017-05-04 DIAGNOSIS — J439 Emphysema, unspecified: Secondary | ICD-10-CM | POA: Diagnosis not present

## 2017-05-04 DIAGNOSIS — J8410 Pulmonary fibrosis, unspecified: Secondary | ICD-10-CM | POA: Diagnosis not present

## 2017-05-06 DIAGNOSIS — J449 Chronic obstructive pulmonary disease, unspecified: Secondary | ICD-10-CM | POA: Diagnosis not present

## 2017-06-04 DIAGNOSIS — J449 Chronic obstructive pulmonary disease, unspecified: Secondary | ICD-10-CM | POA: Diagnosis not present

## 2017-06-04 DIAGNOSIS — J439 Emphysema, unspecified: Secondary | ICD-10-CM | POA: Diagnosis not present

## 2017-06-04 DIAGNOSIS — J8410 Pulmonary fibrosis, unspecified: Secondary | ICD-10-CM | POA: Diagnosis not present

## 2017-06-12 ENCOUNTER — Other Ambulatory Visit: Payer: Self-pay | Admitting: Internal Medicine

## 2017-06-24 DIAGNOSIS — Z7984 Long term (current) use of oral hypoglycemic drugs: Secondary | ICD-10-CM | POA: Diagnosis not present

## 2017-06-24 DIAGNOSIS — E119 Type 2 diabetes mellitus without complications: Secondary | ICD-10-CM | POA: Diagnosis not present

## 2017-06-24 DIAGNOSIS — E7800 Pure hypercholesterolemia, unspecified: Secondary | ICD-10-CM | POA: Diagnosis not present

## 2017-06-24 DIAGNOSIS — I10 Essential (primary) hypertension: Secondary | ICD-10-CM | POA: Diagnosis not present

## 2017-06-24 LAB — HM DIABETES FOOT EXAM

## 2017-06-24 LAB — HEMOGLOBIN A1C: Hemoglobin A1C: 6.3

## 2017-07-02 DIAGNOSIS — J439 Emphysema, unspecified: Secondary | ICD-10-CM | POA: Diagnosis not present

## 2017-07-02 DIAGNOSIS — J8410 Pulmonary fibrosis, unspecified: Secondary | ICD-10-CM | POA: Diagnosis not present

## 2017-07-04 DIAGNOSIS — J449 Chronic obstructive pulmonary disease, unspecified: Secondary | ICD-10-CM | POA: Diagnosis not present

## 2017-08-02 DIAGNOSIS — J439 Emphysema, unspecified: Secondary | ICD-10-CM | POA: Diagnosis not present

## 2017-08-02 DIAGNOSIS — J8410 Pulmonary fibrosis, unspecified: Secondary | ICD-10-CM | POA: Diagnosis not present

## 2017-08-04 DIAGNOSIS — J449 Chronic obstructive pulmonary disease, unspecified: Secondary | ICD-10-CM | POA: Diagnosis not present

## 2017-09-01 DIAGNOSIS — J8410 Pulmonary fibrosis, unspecified: Secondary | ICD-10-CM | POA: Diagnosis not present

## 2017-09-01 DIAGNOSIS — J439 Emphysema, unspecified: Secondary | ICD-10-CM | POA: Diagnosis not present

## 2017-09-03 DIAGNOSIS — J449 Chronic obstructive pulmonary disease, unspecified: Secondary | ICD-10-CM | POA: Diagnosis not present

## 2017-09-08 ENCOUNTER — Other Ambulatory Visit: Payer: Self-pay

## 2017-09-08 ENCOUNTER — Ambulatory Visit: Payer: Medicare PPO | Admitting: *Deleted

## 2017-09-09 ENCOUNTER — Ambulatory Visit: Payer: Medicare PPO | Admitting: *Deleted

## 2017-09-09 ENCOUNTER — Ambulatory Visit: Payer: Medicare PPO | Admitting: Internal Medicine

## 2017-09-09 ENCOUNTER — Encounter: Payer: Self-pay | Admitting: Internal Medicine

## 2017-09-09 VITALS — BP 148/75 | HR 66 | Temp 98.5°F | Resp 18 | Ht 68.0 in | Wt 208.8 lb

## 2017-09-09 DIAGNOSIS — R351 Nocturia: Secondary | ICD-10-CM | POA: Diagnosis not present

## 2017-09-09 DIAGNOSIS — R7989 Other specified abnormal findings of blood chemistry: Secondary | ICD-10-CM | POA: Diagnosis not present

## 2017-09-09 DIAGNOSIS — I10 Essential (primary) hypertension: Secondary | ICD-10-CM | POA: Diagnosis not present

## 2017-09-09 DIAGNOSIS — R5383 Other fatigue: Secondary | ICD-10-CM

## 2017-09-09 DIAGNOSIS — Z09 Encounter for follow-up examination after completed treatment for conditions other than malignant neoplasm: Secondary | ICD-10-CM

## 2017-09-09 LAB — BASIC METABOLIC PANEL
BUN: 20 mg/dL (ref 6–23)
CO2: 31 mEq/L (ref 19–32)
Calcium: 9.5 mg/dL (ref 8.4–10.5)
Chloride: 100 mEq/L (ref 96–112)
Creatinine, Ser: 1.23 mg/dL (ref 0.40–1.50)
GFR: 72.65 mL/min (ref >60.00)
Glucose, Bld: 115 mg/dL — ABNORMAL HIGH (ref 70–99)
Potassium: 4.4 mEq/L (ref 3.5–5.1)
Sodium: 139 mEq/L (ref 135–145)

## 2017-09-09 LAB — CBC
HCT: 46.3 % (ref 39.0–52.0)
Hemoglobin: 15.5 g/dL (ref 13.0–17.0)
MCHC: 33.4 g/dL (ref 30.0–36.0)
MCV: 96.7 fl (ref 78.0–100.0)
Platelets: 176 10*3/uL (ref 150.0–400.0)
RBC: 4.79 Mil/uL (ref 4.22–5.81)
RDW: 14.4 % (ref 11.5–15.5)
WBC: 6 10*3/uL (ref 4.0–10.5)

## 2017-09-09 LAB — POC URINALSYSI DIPSTICK (AUTOMATED)
Bilirubin, UA: NEGATIVE
Blood, UA: NEGATIVE
Glucose, UA: NEGATIVE
Ketones, UA: NEGATIVE
Leukocytes, UA: NEGATIVE
Nitrite, UA: NEGATIVE
Protein, UA: POSITIVE — AB
Spec Grav, UA: 1.015 (ref 1.010–1.025)
Urobilinogen, UA: 0.2 E.U./dL
pH, UA: 6.5 (ref 5.0–8.0)

## 2017-09-09 LAB — PSA: PSA: 1.62 ng/mL (ref 0.10–4.00)

## 2017-09-09 LAB — TSH: TSH: 5.3 u[IU]/mL — ABNORMAL HIGH (ref 0.35–4.50)

## 2017-09-09 LAB — T4, FREE: Free T4: 0.89 ng/dL (ref 0.60–1.60)

## 2017-09-09 LAB — T3, FREE: T3, Free: 3.2 pg/mL (ref 2.3–4.2)

## 2017-09-09 LAB — B12 AND FOLATE PANEL
Folate: >24.1 ng/mL (ref >5.9)
Vitamin B-12: 351 pg/mL (ref 211–911)

## 2017-09-09 NOTE — Patient Instructions (Addendum)
GO TO THE LAB : Get the blood work     GO TO THE FRONT DESK Schedule your next appointment for a checkup in 3 months   Check the  blood pressure 2   times a  week Be sure your blood pressure is between 110/65 and  135/85. If it is consistently higher or lower, let me know

## 2017-09-09 NOTE — Progress Notes (Addendum)
Subjective:    Patient ID: Bradley Johns, male    DOB: [birth year], 83 y.o.   MRN: [id number]  DOS:  09/09/2017 Type of visit - description : rov Interval history: In general doing about the same. He did notice that he is gradually getting more tired, reports that it takes less for him to get DOE despite using portable oxygen. Still able to do all his ADLs. Wonders if fatigue related to a broken sleep d/t nocturia.  Continue with nocturia, every 2 hours, denies dysuria or gross hematuria  Gait:  it is hard for him to describe his sx but when he changes from a solid surface to a carpeted one he feels a little unsecure. Denies actual paresthesias,no  burning feeling on the feet, numbness.  No back pain.  HTN: Good compliance with medication.   Review of Systems See above No fever chills No lower extremity edema No anxiety depression He gave up driving.   Past Medical History:  Diagnosis Date  . Arthritis   . Benign prostatic hypertrophy   . COPD (chronic obstructive pulmonary disease) ([place])    24/7 O2  . Diabetes mellitus    per Dr Posey Pronto  . Epidermal cyst of face    left lower lateral cheek  . Gout   . Hyperlipidemia   . Hypertension   . Pulmonary fibrosis ([place]) 2014  . Sleep apnea     Past Surgical History:  Procedure Laterality Date  . TOE SURGERY      Social History   Socioeconomic History  . Marital status: Married    Spouse name: Not on file  . Number of children: 2  . Years of education: Not on file  . Highest education level: Not on file  Occupational History  . Occupation: retired  Scientific laboratory technician  . Financial resource strain: Not on file  . Food insecurity:    Worry: Not on file    Inability: Not on file  . Transportation needs:    Medical: Not on file    Non-medical: Not on file  Tobacco Use  . Smoking status: Former Smoker    Packs/day: 1.00    Years: 30.00    Pack years: 30.00    Types: Cigarettes    Last attempt to quit: 04/07/1996   Years since quitting: 21.4  . Smokeless tobacco: Never Used  Substance and Sexual Activity  . Alcohol use: Yes    Comment: occassional beer  . Drug use: No  . Sexual activity: Yes  Lifestyle  . Physical activity:    Days per week: Not on file    Minutes per session: Not on file  . Stress: Not on file  Relationships  . Social connections:    Talks on phone: Not on file    Gets together: Not on file    Attends religious service: Not on file    Active member of club or organization: Not on file    Attends meetings of clubs or organizations: Not on file    Relationship status: Not on file  . Intimate partner violence:    Fear of current or ex partner: Not on file    Emotionally abused: Not on file    Physically abused: Not on file    Forced sexual activity: Not on file  Other Topics Concern  . Not on file  Social History Narrative   Lives w/ wife      Allergies as of 09/09/2017   No Known Allergies  Medication List        Accurate as of 09/09/17 11:59 PM. Always use your most recent med list.          ACCU-CHEK AVIVA PLUS test strip Generic drug:  glucose blood Reported on 06/20/2015   ACCU-CHEK AVIVA PLUS w/Device Kit Reported on 06/20/2015   ACCU-CHEK SOFTCLIX LANCETS lancets Reported on 06/20/2015   amLODipine 10 MG tablet Commonly known as:  NORVASC Take 1 tablet (10 mg total) by mouth daily.   aspirin 81 MG tablet Take 81 mg by mouth daily.   atorvastatin 80 MG tablet Commonly known as:  LIPITOR Take 1 tablet (80 mg total) by mouth daily.   B-D UF III MINI PEN NEEDLES 31G X 5 MM Misc Generic drug:  Insulin Pen Needle Reported on 06/20/2015   carvedilol 12.5 MG tablet Commonly known as:  COREG Take 1 tablet (12.5 mg total) by mouth 2 (two) times daily with a meal.   colchicine 0.6 MG tablet Take 1 tablet (0.6 mg total) by mouth daily as needed.   doxazosin 4 MG tablet Commonly known as:  CARDURA Take 1 tablet (4 mg total) by mouth at bedtime.    ketoconazole 2 % cream Commonly known as:  NIZORAL Apply 1 application topically daily.   losartan 50 MG tablet Commonly known as:  COZAAR Take 1 tablet (50 mg total) by mouth daily.   metFORMIN 500 MG tablet Commonly known as:  GLUCOPHAGE Take 500 mg by mouth 2 (two) times daily with a meal.   multivitamin tablet Take 1 tablet by mouth daily.   OXYGEN Inhale into the lungs. 3L Nocturnal and PRN for Hypoxemia   pioglitazone 45 MG tablet Commonly known as:  ACTOS Take 45 mg by mouth daily.          Objective:   Physical Exam BP (!) 148/75 (BP Location: Left Arm, Patient Position: Sitting, Cuff Size: Large)   Pulse 66   Temp 98.5 F (36.9 C) (Oral)   Resp 18   Ht '5\' 8"'$  (1.727 m)   Wt 208 lb 12.8 oz (94.7 kg)   SpO2 90%   BMI 31.75 kg/m  General:   Well developed, well nourished . NAD.  HEENT:  Normocephalic . Face symmetric, atraumatic Lungs:  Severely decreased breath sounds, on oxygen supplements.  Few dry crackles at bases Normal respiratory effort, no intercostal retractions, no accessory muscle use. Heart: RRR,  no murmur.  No pretibial edema bilaterally  Skin: Not pale. Not jaundice Rectal: External abnormalities: none. Normal sphincter tone. No rectal masses or tenderness.  Brown stools Prostate: Prostate gland firm and smooth, slightly enlarged, nontender, no nodularity.   Neurologic:  alert & oriented X3.  Speech normal, gait appropriate for age and unassisted Psych--  Cognition and judgment appear intact.  Cooperative with normal attention span and concentration.  Behavior appropriate. No anxious or depressed appearing.      Assessment & Plan:    Assessment  DM Dr. Posey Pronto  HTN Hyperlipidemia DJD-- rarely uses hydrocodone Gout -- occ colchicine PULM: --COPD on oxygen qhs, rec O2 24/7 on 06-2015 --OSA - no compliant w/ cpap --Pulmonary fibrosis BPH (Nocturia), has not seen urology, last PSA DRE  2016 wnl, myrbetriq -no help    PLAN    DM: Per endocrinology, last A1c 6.3 HTN: BP 148/75, recommend to check in the ambulatory setting, continue amlodipine, carvedilol, Cardura, losartan.  Check a BMP CBC Fatigue: getting DOE more easily, likely multifactorial including deconditioning and broken sleep d/t  nocturia; checking a BMP, CBC.  Will also check vitamin levels and TFTs. Increased TSH: Checking TFTs Nocturia: DRE showed slight increased gland size, checking PSA, UA, urine culture.  He failed previous trials with different meds, offered a urology referral.he agreed , refer to [place]. RTC 3 months

## 2017-09-10 LAB — URINE CULTURE
MICRO NUMBER:: 90676286
Result:: NO GROWTH
SPECIMEN QUALITY:: ADEQUATE

## 2017-09-10 NOTE — Assessment & Plan Note (Addendum)
DM: Per endocrinology, last A1c 6.3 HTN: BP 148/75, recommend to check in the ambulatory setting, continue amlodipine, carvedilol, Cardura, losartan.  Check a BMP CBC Fatigue: getting DOE more easily, likely multifactorial including deconditioning and broken sleep d/t nocturia; checking a BMP, CBC.  Will also check vitamin levels and TFTs. Increased TSH: Checking TFTs Nocturia: DRE showed slight increased gland size, checking PSA, UA, urine culture.  He failed previous trials with different meds, offered a urology referral.he agreed , refer to [place]. RTC 3 months

## 2017-09-22 ENCOUNTER — Telehealth: Payer: Self-pay | Admitting: *Deleted

## 2017-09-22 NOTE — Telephone Encounter (Signed)
Received Physician Orders/Renewal Prescription and LMN from [facility] for Oxygen; forwarded to provider/SLS 06/18

## 2017-09-24 NOTE — Telephone Encounter (Signed)
Forms completed and faxed to [employer]- [phone]. Forms sent for scanning.

## 2017-10-02 DIAGNOSIS — J439 Emphysema, unspecified: Secondary | ICD-10-CM | POA: Diagnosis not present

## 2017-10-02 DIAGNOSIS — J8410 Pulmonary fibrosis, unspecified: Secondary | ICD-10-CM | POA: Diagnosis not present

## 2017-10-04 DIAGNOSIS — J449 Chronic obstructive pulmonary disease, unspecified: Secondary | ICD-10-CM | POA: Diagnosis not present

## 2017-10-29 ENCOUNTER — Encounter: Payer: Self-pay | Admitting: Internal Medicine

## 2017-11-01 DIAGNOSIS — J8410 Pulmonary fibrosis, unspecified: Secondary | ICD-10-CM | POA: Diagnosis not present

## 2017-11-01 DIAGNOSIS — J439 Emphysema, unspecified: Secondary | ICD-10-CM | POA: Diagnosis not present

## 2017-11-03 DIAGNOSIS — J449 Chronic obstructive pulmonary disease, unspecified: Secondary | ICD-10-CM | POA: Diagnosis not present

## 2017-11-06 DIAGNOSIS — E785 Hyperlipidemia, unspecified: Secondary | ICD-10-CM | POA: Diagnosis not present

## 2017-11-06 DIAGNOSIS — Z6830 Body mass index (BMI) 30.0-30.9, adult: Secondary | ICD-10-CM | POA: Diagnosis not present

## 2017-11-06 DIAGNOSIS — E119 Type 2 diabetes mellitus without complications: Secondary | ICD-10-CM | POA: Diagnosis not present

## 2017-11-06 DIAGNOSIS — I10 Essential (primary) hypertension: Secondary | ICD-10-CM | POA: Diagnosis not present

## 2017-11-18 DIAGNOSIS — E113293 Type 2 diabetes mellitus with mild nonproliferative diabetic retinopathy without macular edema, bilateral: Secondary | ICD-10-CM | POA: Diagnosis not present

## 2017-11-18 DIAGNOSIS — H2511 Age-related nuclear cataract, right eye: Secondary | ICD-10-CM | POA: Diagnosis not present

## 2017-11-18 DIAGNOSIS — H35372 Puckering of macula, left eye: Secondary | ICD-10-CM | POA: Diagnosis not present

## 2017-11-18 DIAGNOSIS — H26492 Other secondary cataract, left eye: Secondary | ICD-10-CM | POA: Diagnosis not present

## 2017-11-18 DIAGNOSIS — Z961 Presence of intraocular lens: Secondary | ICD-10-CM | POA: Diagnosis not present

## 2017-12-02 DIAGNOSIS — H2513 Age-related nuclear cataract, bilateral: Secondary | ICD-10-CM | POA: Diagnosis not present

## 2017-12-02 DIAGNOSIS — J439 Emphysema, unspecified: Secondary | ICD-10-CM | POA: Diagnosis not present

## 2017-12-02 DIAGNOSIS — H2511 Age-related nuclear cataract, right eye: Secondary | ICD-10-CM | POA: Diagnosis not present

## 2017-12-02 DIAGNOSIS — J8410 Pulmonary fibrosis, unspecified: Secondary | ICD-10-CM | POA: Diagnosis not present

## 2017-12-04 DIAGNOSIS — J449 Chronic obstructive pulmonary disease, unspecified: Secondary | ICD-10-CM | POA: Diagnosis not present

## 2017-12-09 ENCOUNTER — Ambulatory Visit: Payer: Medicare PPO | Admitting: Internal Medicine

## 2017-12-09 ENCOUNTER — Encounter: Payer: Self-pay | Admitting: Internal Medicine

## 2017-12-09 VITALS — BP 150/49 | HR 74 | Temp 97.8°F | Resp 16 | Ht 68.0 in | Wt 210.4 lb

## 2017-12-09 DIAGNOSIS — M109 Gout, unspecified: Secondary | ICD-10-CM

## 2017-12-09 DIAGNOSIS — R351 Nocturia: Secondary | ICD-10-CM | POA: Diagnosis not present

## 2017-12-09 DIAGNOSIS — I10 Essential (primary) hypertension: Secondary | ICD-10-CM

## 2017-12-09 MED ORDER — DOXAZOSIN MESYLATE 4 MG PO TABS: 4.0000 mg | ORAL_TABLET | Freq: Every day | ORAL | 1 refills | Status: DC

## 2017-12-09 MED ORDER — COLCHICINE 0.6 MG PO TABS: 0.6000 mg | ORAL_TABLET | Freq: Two times a day (BID) | ORAL | 1 refills | Status: DC | PRN

## 2017-12-09 MED ORDER — HYDROCODONE-ACETAMINOPHEN 5-325 MG PO TABS: 1.0000 | ORAL_TABLET | Freq: Three times a day (TID) | ORAL | 0 refills | Status: DC | PRN

## 2017-12-09 NOTE — Assessment & Plan Note (Signed)
HTN: BP today 150/94, ambulatory BPs range from 130-145  Goal: 120, 130.  Reports good compliance with amlodipine, carvedilol, losartan.  States he is taking Cardura 2 mg but I've been  prescribing Cardura 4 mg.  Recent BMP satisfactory, prescription for Cardura 4 mg printed to be sure of compliance.  Recommend to monitor BPs Fatigue: At baseline, recent labs okay.  Patient thinks is related to poor sleep d/t nocturia (agree) Nocturia: Recommend to see urology.  See comments under HTN, increasing Cardura. Gout: Refill Colcrys and hydrocodone which he takes very rarely RTC 3 to 4 months, CPX

## 2017-12-09 NOTE — Progress Notes (Signed)
Pre visit review using our clinic review tool, if applicable. No additional management support is needed unless otherwise documented below in the visit note. 

## 2017-12-09 NOTE — Progress Notes (Signed)
Subjective:    Patient ID: Bradley Johns, male    DOB: November 14, 1934, 83 y.o.   MRN: [id number]  DOS:  12/09/2017 Type of visit - description : Follow-up from previous visit Interval history: Doing about the same. Needs a refill on Colcrys and hydrocodone Fatigue and nocturia are about the same. Ambulatory BPs 130, 145 on average  Review of Systems  No chest pain, difficulty breathing at baseline Reports occasional imbalance, denies any history of neuropathy, no lower extremity paresthesias, no diplopia, slurred speech.  Past Medical History:  Diagnosis Date  . Arthritis   . Benign prostatic hypertrophy   . COPD (chronic obstructive pulmonary disease) ([place])    24/7 O2  . Diabetes mellitus    per Dr Posey Pronto  . Epidermal cyst of face    left lower lateral cheek  . Gout   . Hyperlipidemia   . Hypertension   . Pulmonary fibrosis ([place]) 2014  . Sleep apnea     Past Surgical History:  Procedure Laterality Date  . TOE SURGERY      Social History   Socioeconomic History  . Marital status: Married    Spouse name: Not on file  . Number of children: 2  . Years of education: Not on file  . Highest education level: Not on file  Occupational History  . Occupation: retired  Scientific laboratory technician  . Financial resource strain: Not on file  . Food insecurity:    Worry: Not on file    Inability: Not on file  . Transportation needs:    Medical: Not on file    Non-medical: Not on file  Tobacco Use  . Smoking status: Former Smoker    Packs/day: 1.00    Years: 30.00    Pack years: 30.00    Types: Cigarettes    Last attempt to quit: 04/07/1996    Years since quitting: 21.6  . Smokeless tobacco: Never Used  Substance and Sexual Activity  . Alcohol use: Yes    Comment: occassional beer  . Drug use: No  . Sexual activity: Yes  Lifestyle  . Physical activity:    Days per week: Not on file    Minutes per session: Not on file  . Stress: Not on file  Relationships  . Social connections:     Talks on phone: Not on file    Gets together: Not on file    Attends religious service: Not on file    Active member of club or organization: Not on file    Attends meetings of clubs or organizations: Not on file    Relationship status: Not on file  . Intimate partner violence:    Fear of current or ex partner: Not on file    Emotionally abused: Not on file    Physically abused: Not on file    Forced sexual activity: Not on file  Other Topics Concern  . Not on file  Social History Narrative   Lives w/ wife      Allergies as of 12/09/2017   No Known Allergies     Medication List        Accurate as of 12/09/17  9:40 PM. Always use your most recent med list.          ACCU-CHEK AVIVA PLUS test strip Generic drug:  glucose blood Reported on 06/20/2015   ACCU-CHEK AVIVA PLUS w/Device Kit Reported on 06/20/2015   ACCU-CHEK SOFTCLIX LANCETS lancets Reported on 06/20/2015   amLODipine 10 MG tablet  Commonly known as:  NORVASC Take 1 tablet (10 mg total) by mouth daily.   aspirin 81 MG tablet Take 81 mg by mouth daily.   atorvastatin 80 MG tablet Commonly known as:  LIPITOR Take 1 tablet (80 mg total) by mouth daily.   B-D UF III MINI PEN NEEDLES 31G X 5 MM Misc Generic drug:  Insulin Pen Needle Reported on 06/20/2015   carvedilol 12.5 MG tablet Commonly known as:  COREG Take 1 tablet (12.5 mg total) by mouth 2 (two) times daily with a meal.   colchicine 0.6 MG tablet Take 1 tablet (0.6 mg total) by mouth 2 (two) times daily as needed.   doxazosin 4 MG tablet Commonly known as:  CARDURA Take 1 tablet (4 mg total) by mouth at bedtime.   HYDROcodone-acetaminophen 5-325 MG tablet Commonly known as:  NORCO/VICODIN Take 1 tablet by mouth every 8 (eight) hours as needed.   ketoconazole 2 % cream Commonly known as:  NIZORAL Apply 1 application topically daily.   losartan 50 MG tablet Commonly known as:  COZAAR Take 1 tablet (50 mg total) by mouth daily.     metFORMIN 500 MG tablet Commonly known as:  GLUCOPHAGE Take 500 mg by mouth 2 (two) times daily with a meal.   multivitamin tablet Take 1 tablet by mouth daily.   OXYGEN Inhale into the lungs. 3L Nocturnal and PRN for Hypoxemia   pioglitazone 45 MG tablet Commonly known as:  ACTOS Take 45 mg by mouth daily.          Objective:   Physical Exam BP (!) 150/49 (BP Location: Right Arm, Patient Position: Sitting, Cuff Size: Normal)   Pulse 74   Temp 97.8 F (36.6 C) (Oral)   Resp 16   Ht 5' 8" (1.727 m)   Wt 210 lb 6 oz (95.4 kg)   SpO2 (!) 86% Comment: on O2  BMI 31.99 kg/m  General:   Well developed, NAD, see BMI.  HEENT:  Normocephalic . Face symmetric, atraumatic Lungs:  Decreased breath sounds Normal respiratory effort, no intercostal retractions, no accessory muscle use. Heart: RRR,  no murmur.  No pretibial edema bilaterally  Skin: Not pale. Not jaundice Neurologic:  alert & oriented X3.  Speech normal, gait appropriate for age and unassisted Psych--  Cognition and judgment appear intact.  Cooperative with normal attention span and concentration.  Behavior appropriate. No anxious or depressed appearing.      Assessment & Plan:   Assessment  DM Dr. Posey Pronto  HTN Hyperlipidemia DJD-- rarely uses hydrocodone Gout -- occ colchicine PULM: --COPD on oxygen qhs, rec O2 24/7 on 06-2015 --OSA - no compliant w/ cpap --Pulmonary fibrosis BPH (Nocturia), has not seen urology, last PSA DRE  2016 wnl, myrbetriq -no help    PLAN  HTN: BP today 150/94, ambulatory BPs range from 130-145  Goal: 120, 130.  Reports good compliance with amlodipine, carvedilol, losartan.  States he is taking Cardura 2 mg but I've been  prescribing Cardura 4 mg.  Recent BMP satisfactory, prescription for Cardura 4 mg printed to be sure of compliance.  Recommend to monitor BPs Fatigue: At baseline, recent labs okay.  Patient thinks is related to poor sleep d/t nocturia (agree) Nocturia:  Recommend to see urology.  See comments under HTN, increasing Cardura. Gout: Refill Colcrys and hydrocodone which he takes very rarely RTC 3 to 4 months, CPX

## 2017-12-09 NOTE — Patient Instructions (Signed)
  GO TO THE FRONT DESK Schedule your next appointment for a physical exam in 3 to 4 months from today, fasting   Get a flu shot by October  Take Cardura 4 mg daily along with all the other medications.  Watch for dizziness.  Monitor your blood pressures.    Check the  blood pressure 2 or 3 times a  Week  Be sure your blood pressure is between 110/65 and  135/85. If it is consistently higher or lower, let me know

## 2017-12-16 ENCOUNTER — Telehealth: Payer: Self-pay | Admitting: Internal Medicine

## 2017-12-16 NOTE — Telephone Encounter (Signed)
Copied from [place]. Topic: Quick Communication - See Telephone Encounter >> Dec 16, 2017  4:03 PM Gardiner Ramus wrote: CRM for notification. See Telephone encounter for: 12/16/17.colchicine 0.6 MG tablet [[id number]]  pt called and stated that Rx was sent to wrong pharmacy.[facility] [place], [place] [address] [phone] (Phone) [phone] (Fax)    only needs 90 day supply

## 2017-12-16 NOTE — Telephone Encounter (Signed)
Pt states that he talked to [facility] and they are recommending Allopurinol ($0 for 90 day supply) to prevent gout and prednisone to treat gout. If Dr. Larose Kells will approve these two medications he wants them to be ordered thru [facility].  If Dr. Larose Kells does not approve pt would still like the colchicine (30 day supply) to go to the [facility] on [address]. Pt has a few colchicine left right now.

## 2017-12-17 NOTE — Telephone Encounter (Signed)
As his doctor rec to continue colchicine as needed as recommended , ok to send 60 tabs a and 2 RFs

## 2017-12-18 MED ORDER — COLCHICINE 0.6 MG PO TABS: 0.6000 mg | ORAL_TABLET | Freq: Two times a day (BID) | ORAL | 2 refills | Status: DC | PRN

## 2017-12-18 NOTE — Telephone Encounter (Signed)
Rx sent to [facility]. Notified pt.

## 2017-12-22 DIAGNOSIS — H2181 Floppy iris syndrome: Secondary | ICD-10-CM | POA: Diagnosis not present

## 2017-12-22 DIAGNOSIS — H2511 Age-related nuclear cataract, right eye: Secondary | ICD-10-CM | POA: Diagnosis not present

## 2017-12-22 DIAGNOSIS — H25811 Combined forms of age-related cataract, right eye: Secondary | ICD-10-CM | POA: Diagnosis not present

## 2018-01-02 DIAGNOSIS — J439 Emphysema, unspecified: Secondary | ICD-10-CM | POA: Diagnosis not present

## 2018-01-02 DIAGNOSIS — J8410 Pulmonary fibrosis, unspecified: Secondary | ICD-10-CM | POA: Diagnosis not present

## 2018-01-04 DIAGNOSIS — J449 Chronic obstructive pulmonary disease, unspecified: Secondary | ICD-10-CM | POA: Diagnosis not present

## 2018-01-11 ENCOUNTER — Ambulatory Visit (INDEPENDENT_AMBULATORY_CARE_PROVIDER_SITE_OTHER): Payer: Medicare PPO

## 2018-01-11 DIAGNOSIS — Z23 Encounter for immunization: Secondary | ICD-10-CM | POA: Diagnosis not present

## 2018-02-01 DIAGNOSIS — J8410 Pulmonary fibrosis, unspecified: Secondary | ICD-10-CM | POA: Diagnosis not present

## 2018-02-01 DIAGNOSIS — J439 Emphysema, unspecified: Secondary | ICD-10-CM | POA: Diagnosis not present

## 2018-02-03 DIAGNOSIS — J449 Chronic obstructive pulmonary disease, unspecified: Secondary | ICD-10-CM | POA: Diagnosis not present

## 2018-03-04 DIAGNOSIS — J8410 Pulmonary fibrosis, unspecified: Secondary | ICD-10-CM | POA: Diagnosis not present

## 2018-03-04 DIAGNOSIS — J439 Emphysema, unspecified: Secondary | ICD-10-CM | POA: Diagnosis not present

## 2018-03-06 DIAGNOSIS — J449 Chronic obstructive pulmonary disease, unspecified: Secondary | ICD-10-CM | POA: Diagnosis not present

## 2018-03-24 ENCOUNTER — Encounter: Payer: Self-pay | Admitting: Internal Medicine

## 2018-03-24 ENCOUNTER — Ambulatory Visit (INDEPENDENT_AMBULATORY_CARE_PROVIDER_SITE_OTHER): Payer: Medicare PPO | Admitting: Internal Medicine

## 2018-03-24 VITALS — BP 136/64 | HR 71 | Temp 97.5°F | Resp 18 | Ht 68.0 in | Wt 208.4 lb

## 2018-03-24 DIAGNOSIS — R351 Nocturia: Secondary | ICD-10-CM

## 2018-03-24 DIAGNOSIS — E039 Hypothyroidism, unspecified: Secondary | ICD-10-CM | POA: Diagnosis not present

## 2018-03-24 DIAGNOSIS — Z0000 Encounter for general adult medical examination without abnormal findings: Secondary | ICD-10-CM | POA: Diagnosis not present

## 2018-03-24 DIAGNOSIS — E038 Other specified hypothyroidism: Secondary | ICD-10-CM

## 2018-03-24 LAB — COMPREHENSIVE METABOLIC PANEL
ALT: 16 U/L (ref 0–53)
AST: 23 U/L (ref 0–37)
Albumin: 4.1 g/dL (ref 3.5–5.2)
Alkaline Phosphatase: 69 U/L (ref 39–117)
BUN: 15 mg/dL (ref 6–23)
CO2: 30 mEq/L (ref 19–32)
Calcium: 9.4 mg/dL (ref 8.4–10.5)
Chloride: 103 mEq/L (ref 96–112)
Creatinine, Ser: 1.13 mg/dL (ref 0.40–1.50)
GFR: 80.01 mL/min (ref >60.00)
Glucose, Bld: 98 mg/dL (ref 70–99)
Potassium: 4.2 mEq/L (ref 3.5–5.1)
Sodium: 141 mEq/L (ref 135–145)
Total Bilirubin: 0.8 mg/dL (ref 0.2–1.2)
Total Protein: 7.2 g/dL (ref 6.0–8.3)

## 2018-03-24 LAB — LIPID PANEL
Cholesterol: 135 mg/dL (ref 0–200)
HDL: 46.3 mg/dL (ref >39.00)
LDL Cholesterol: 65 mg/dL (ref 0–99)
NonHDL: 88.37
Total CHOL/HDL Ratio: 3
Triglycerides: 119 mg/dL (ref 0.0–149.0)
VLDL: 23.8 mg/dL (ref 0.0–40.0)

## 2018-03-24 LAB — TSH: TSH: 6.51 u[IU]/mL — ABNORMAL HIGH (ref 0.35–4.50)

## 2018-03-24 LAB — T4, FREE: Free T4: 0.91 ng/dL (ref 0.60–1.60)

## 2018-03-24 LAB — T3, FREE: T3, Free: 3.4 pg/mL (ref 2.3–4.2)

## 2018-03-24 MED ORDER — KETOCONAZOLE 2 % EX CREA: 1.0000 "application " | TOPICAL_CREAM | Freq: Every day | CUTANEOUS | 3 refills | Status: AC

## 2018-03-24 NOTE — Patient Instructions (Addendum)
Please schedule Medicare Wellness with Glenard Haring.   GO TO THE LAB : Get the blood work     GO TO THE FRONT DESK Schedule your next appointment for a checkup in 6 to 8 months  Please see the doctors at the pulmonology clinic at [facility] regards to sleep apnea.

## 2018-03-24 NOTE — Progress Notes (Signed)
Subjective:    Patient ID: Bradley Johns, male    DOB: 03/06/1935, 83 y.o.   MRN: [id number]  DOS:  03/24/2018 Type of visit - description : CPX In general is at baseline.  Good compliance with oxygen.   Review of Systems Still has urinary frequency, not better since he increase Cardura.  Other than above, a 14 point review of systems is negative    Past Medical History:  Diagnosis Date  . Arthritis   . Benign prostatic hypertrophy   . COPD (chronic obstructive pulmonary disease) ([place])    24/7 O2  . Diabetes mellitus    per Dr Posey Pronto  . Epidermal cyst of face    left lower lateral cheek  . Gout   . Hyperlipidemia   . Hypertension   . Pulmonary fibrosis ([place]) 2014  . Sleep apnea     Past Surgical History:  Procedure Laterality Date  . TOE SURGERY      Social History   Socioeconomic History  . Marital status: Married    Spouse name: Not on file  . Number of children: 2  . Years of education: Not on file  . Highest education level: Not on file  Occupational History  . Occupation: retired  Scientific laboratory technician  . Financial resource strain: Not on file  . Food insecurity:    Worry: Not on file    Inability: Not on file  . Transportation needs:    Medical: Not on file    Non-medical: Not on file  Tobacco Use  . Smoking status: Former Smoker    Packs/day: 1.00    Years: 30.00    Pack years: 30.00    Types: Cigarettes    Last attempt to quit: 04/07/1996    Years since quitting: 21.9  . Smokeless tobacco: Never Used  Substance and Sexual Activity  . Alcohol use: Yes    Comment: occassional beer  . Drug use: No  . Sexual activity: Yes  Lifestyle  . Physical activity:    Days per week: Not on file    Minutes per session: Not on file  . Stress: Not on file  Relationships  . Social connections:    Talks on phone: Not on file    Gets together: Not on file    Attends religious service: Not on file    Active member of club or organization: Not on file   Attends meetings of clubs or organizations: Not on file    Relationship status: Not on file  . Intimate partner violence:    Fear of current or ex partner: Not on file    Emotionally abused: Not on file    Physically abused: Not on file    Forced sexual activity: Not on file  Other Topics Concern  . Not on file  Social History Narrative   Lives w/ wife     Family History  Problem Relation Age of Onset  . Liver disease Mother   . Coronary artery disease Father 86  . Diabetes Sister   . Cancer Brother        type?  Marland Kitchen Hypertension Neg Hx   . Hyperlipidemia Neg Hx   . Heart attack Neg Hx   . Prostate cancer Neg Hx   . Colon cancer Neg Hx      Allergies as of 03/24/2018   No Known Allergies     Medication List       Accurate as of March 24, 2018 11:59 PM.  Always use your most recent med list.        ACCU-CHEK AVIVA PLUS test strip Generic drug:  glucose blood Reported on 06/20/2015   ACCU-CHEK AVIVA PLUS w/Device Kit Reported on 06/20/2015   ACCU-CHEK SOFTCLIX LANCETS lancets Reported on 06/20/2015   amLODipine 10 MG tablet Commonly known as:  NORVASC Take 1 tablet (10 mg total) by mouth daily.   aspirin 81 MG tablet Take 81 mg by mouth daily.   atorvastatin 80 MG tablet Commonly known as:  LIPITOR Take 1 tablet (80 mg total) by mouth daily.   B-D UF III MINI PEN NEEDLES 31G X 5 MM Misc Generic drug:  Insulin Pen Needle Reported on 06/20/2015   carvedilol 12.5 MG tablet Commonly known as:  COREG Take 1 tablet (12.5 mg total) by mouth 2 (two) times daily with a meal.   colchicine 0.6 MG tablet Take 1 tablet (0.6 mg total) by mouth 2 (two) times daily as needed.   doxazosin 4 MG tablet Commonly known as:  CARDURA Take 1 tablet (4 mg total) by mouth at bedtime.   HYDROcodone-acetaminophen 5-325 MG tablet Commonly known as:  NORCO/VICODIN Take 1 tablet by mouth every 8 (eight) hours as needed.   ketoconazole 2 % cream Commonly known as:   NIZORAL Apply 1 application topically daily.   losartan 50 MG tablet Commonly known as:  COZAAR Take 1 tablet (50 mg total) by mouth daily.   metFORMIN 500 MG tablet Commonly known as:  GLUCOPHAGE Take 500 mg by mouth 2 (two) times daily with a meal.   multivitamin tablet Take 1 tablet by mouth daily.   OXYGEN Inhale into the lungs. 3L Nocturnal and PRN for Hypoxemia   pioglitazone 45 MG tablet Commonly known as:  ACTOS Take 45 mg by mouth daily.           Objective:   Physical Exam BP 136/64 (BP Location: Left Arm, Patient Position: Sitting, Cuff Size: Normal)   Pulse 71   Temp (!) 97.5 F (36.4 C) (Oral)   Resp 18   Ht '5\' 8"'$  (1.727 m)   Wt 208 lb 6 oz (94.5 kg)   SpO2 (!) 77% Comment: On O2  BMI 31.68 kg/m  General: Well developed, NAD, BMI noted Neck: No  thyromegaly  HEENT:  Normocephalic . Face symmetric, atraumatic Lungs:  Decreased breath sounds Normal respiratory effort, no intercostal retractions, no accessory muscle use. Heart: RRR,  no murmur.  Trace pretibial edema bilaterally  Abdomen:  Not distended, soft, non-tender. No rebound or rigidity.   Skin: Exposed areas without rash. Not pale. Not jaundice Neurologic:  alert & oriented X3.  Speech normal, gait appropriate for age and unassisted Strength symmetric and appropriate for age.  Psych: Cognition and judgment appear intact.  Cooperative with normal attention span and concentration.  Behavior appropriate. No anxious or depressed appearing.     Assessment & Plan:    Assessment  DM Dr. Posey Pronto  HTN Hyperlipidemia DJD-- rarely uses hydrocodone Gout -- occ colchicine PULM: --COPD on oxygen  24/7   --OSA - no compliant w/ cpap --Pulmonary fibrosis BPH (Nocturia), has not seen urology, last PSA DRE  2016 wnl, myrbetriq -no help    PLAN  DM: Per Dr. Posey Pronto, he has feet exams done there regularly YQI:[id number] amlodipine, carvedilol, Cardura 4 mg, losartan. Hyperlipidemia: On Lipitor,  checking labs COPD, pulmonary fibrosis: Continue to be DOE, at baseline, good compliance with oxygen, O2 sats at home are in the high 80s to  low 90s.  Using 3 L/min. Sleep apnea: Seen by pulmonary at [facility] 11/19/2016, he was referred for sleep study, did not pursue it.  He actually has no recollection of the visit.  Recommend to see pulmonary regularly. Dermatitis: Refill Nizoral, uses as needed Nocturia: Continue with symptoms, increased Cardura did not help, at this point he agreed to see urology.  Will refer him. RTC 6 to 8 months

## 2018-03-24 NOTE — Progress Notes (Signed)
Pre visit review using our clinic review tool, if applicable. No additional management support is needed unless otherwise documented below in the visit note. 

## 2018-03-24 NOTE — Assessment & Plan Note (Addendum)
--  Td 2011; pneumonia shot 2011 ;prevnar provided 12-2014;  Had a flu shot; shingles shot discussed  --CCS:  Had a Cscope w/ [facility] Collene Mares before, repeated  Cscope 07-25-09 (d/t a  + hemocult)---- poor prep, biopsies were done. GI recommended six-month follow-up colonoscopy with a five-day prep.  That was not pursued, today he reports no interest on further colonoscopies ---Prostate cancer screening: Not indicated.  We are referring him to urology due to symptoms -Labs: CMP, FLP, TSH, T3, T4. --Diet-exercise discussed

## 2018-03-25 NOTE — Assessment & Plan Note (Signed)
DM: Per Dr. Posey Pronto, he has feet exams done there regularly XEN:[id number] amlodipine, carvedilol, Cardura 4 mg, losartan. Hyperlipidemia: On Lipitor, checking labs COPD, pulmonary fibrosis: Continue to be DOE, at baseline, good compliance with oxygen, O2 sats at home are in the high 80s to low 90s.  Using 3 L/min. Sleep apnea: Seen by pulmonary at [facility] 11/19/2016, he was referred for sleep study, did not pursue it.  He actually has no recollection of the visit.  Recommend to see pulmonary regularly. Dermatitis: Refill Nizoral, uses as needed Nocturia: Continue with symptoms, increased Cardura did not help, at this point he agreed to see urology.  Will refer him. RTC 6 to 8 months

## 2018-04-03 DIAGNOSIS — J439 Emphysema, unspecified: Secondary | ICD-10-CM | POA: Diagnosis not present

## 2018-04-03 DIAGNOSIS — J8410 Pulmonary fibrosis, unspecified: Secondary | ICD-10-CM | POA: Diagnosis not present

## 2018-04-05 DIAGNOSIS — J449 Chronic obstructive pulmonary disease, unspecified: Secondary | ICD-10-CM | POA: Diagnosis not present

## 2018-04-27 ENCOUNTER — Other Ambulatory Visit: Payer: Self-pay | Admitting: Internal Medicine

## 2018-05-04 DIAGNOSIS — J8410 Pulmonary fibrosis, unspecified: Secondary | ICD-10-CM | POA: Diagnosis not present

## 2018-05-04 DIAGNOSIS — J439 Emphysema, unspecified: Secondary | ICD-10-CM | POA: Diagnosis not present

## 2018-05-06 DIAGNOSIS — J449 Chronic obstructive pulmonary disease, unspecified: Secondary | ICD-10-CM | POA: Diagnosis not present

## 2018-05-16 ENCOUNTER — Other Ambulatory Visit: Payer: Self-pay

## 2018-05-16 ENCOUNTER — Inpatient Hospital Stay (HOSPITAL_BASED_OUTPATIENT_CLINIC_OR_DEPARTMENT_OTHER)
Admission: EM | Admit: 2018-05-16 | Discharge: 2018-05-18 | DRG: 378 | Disposition: A | Discharge status: Home / Self Care | Payer: Medicare Other | Attending: Internal Medicine | Admitting: Internal Medicine

## 2018-05-16 ENCOUNTER — Encounter (HOSPITAL_BASED_OUTPATIENT_CLINIC_OR_DEPARTMENT_OTHER): Payer: Self-pay | Admitting: *Deleted

## 2018-05-16 DIAGNOSIS — Z7984 Long term (current) use of oral hypoglycemic drugs: Secondary | ICD-10-CM

## 2018-05-16 DIAGNOSIS — N182 Chronic kidney disease, stage 2 (mild): Secondary | ICD-10-CM | POA: Diagnosis present

## 2018-05-16 DIAGNOSIS — Z79899 Other long term (current) drug therapy: Secondary | ICD-10-CM

## 2018-05-16 DIAGNOSIS — Z8249 Family history of ischemic heart disease and other diseases of the circulatory system: Secondary | ICD-10-CM

## 2018-05-16 DIAGNOSIS — N400 Enlarged prostate without lower urinary tract symptoms: Secondary | ICD-10-CM | POA: Diagnosis present

## 2018-05-16 DIAGNOSIS — I129 Hypertensive chronic kidney disease with stage 1 through stage 4 chronic kidney disease, or unspecified chronic kidney disease: Secondary | ICD-10-CM | POA: Diagnosis not present

## 2018-05-16 DIAGNOSIS — Z87891 Personal history of nicotine dependence: Secondary | ICD-10-CM

## 2018-05-16 DIAGNOSIS — Z833 Family history of diabetes mellitus: Secondary | ICD-10-CM | POA: Diagnosis not present

## 2018-05-16 DIAGNOSIS — E669 Obesity, unspecified: Secondary | ICD-10-CM | POA: Diagnosis not present

## 2018-05-16 DIAGNOSIS — Z7982 Long term (current) use of aspirin: Secondary | ICD-10-CM

## 2018-05-16 DIAGNOSIS — E785 Hyperlipidemia, unspecified: Secondary | ICD-10-CM | POA: Diagnosis present

## 2018-05-16 DIAGNOSIS — D649 Anemia, unspecified: Secondary | ICD-10-CM | POA: Diagnosis present

## 2018-05-16 DIAGNOSIS — G4730 Sleep apnea, unspecified: Secondary | ICD-10-CM | POA: Diagnosis not present

## 2018-05-16 DIAGNOSIS — K5731 Diverticulosis of large intestine without perforation or abscess with bleeding: Principal | ICD-10-CM | POA: Diagnosis present

## 2018-05-16 DIAGNOSIS — E1122 Type 2 diabetes mellitus with diabetic chronic kidney disease: Secondary | ICD-10-CM | POA: Diagnosis not present

## 2018-05-16 DIAGNOSIS — K625 Hemorrhage of anus and rectum: Secondary | ICD-10-CM | POA: Diagnosis not present

## 2018-05-16 DIAGNOSIS — Z9981 Dependence on supplemental oxygen: Secondary | ICD-10-CM | POA: Diagnosis not present

## 2018-05-16 DIAGNOSIS — K922 Gastrointestinal hemorrhage, unspecified: Secondary | ICD-10-CM | POA: Diagnosis present

## 2018-05-16 DIAGNOSIS — J449 Chronic obstructive pulmonary disease, unspecified: Secondary | ICD-10-CM | POA: Diagnosis present

## 2018-05-16 DIAGNOSIS — K621 Rectal polyp: Secondary | ICD-10-CM | POA: Diagnosis not present

## 2018-05-16 DIAGNOSIS — J9611 Chronic respiratory failure with hypoxia: Secondary | ICD-10-CM | POA: Diagnosis not present

## 2018-05-16 DIAGNOSIS — K921 Melena: Secondary | ICD-10-CM | POA: Diagnosis not present

## 2018-05-16 DIAGNOSIS — Z6830 Body mass index (BMI) 30.0-30.9, adult: Secondary | ICD-10-CM | POA: Diagnosis not present

## 2018-05-16 LAB — CBC WITH DIFFERENTIAL/PLATELET
Abs Immature Granulocytes: 0.02 10*3/uL (ref 0.00–0.07)
Basophils Absolute: 0 10*3/uL (ref 0.0–0.1)
Basophils Relative: 0 %
Eosinophils Absolute: 0.2 10*3/uL (ref 0.0–0.5)
Eosinophils Relative: 2 %
HCT: 39.5 % (ref 39.0–52.0)
Hemoglobin: 12 g/dL — ABNORMAL LOW (ref 13.0–17.0)
Immature Granulocytes: 0 %
Lymphocytes Relative: 20 %
Lymphs Abs: 1.6 10*3/uL (ref 0.7–4.0)
MCH: 30.6 pg (ref 26.0–34.0)
MCHC: 30.4 g/dL (ref 30.0–36.0)
MCV: 100.8 fL — ABNORMAL HIGH (ref 80.0–100.0)
Monocytes Absolute: 0.7 10*3/uL (ref 0.1–1.0)
Monocytes Relative: 8 %
Neutro Abs: 5.7 10*3/uL (ref 1.7–7.7)
Neutrophils Relative %: 70 %
Platelets: 163 10*3/uL (ref 150–400)
RBC: 3.92 MIL/uL — ABNORMAL LOW (ref 4.22–5.81)
RDW: 14 % (ref 11.5–15.5)
WBC: 8.2 10*3/uL (ref 4.0–10.5)
nRBC: 0 % (ref 0.0–0.2)

## 2018-05-16 LAB — BASIC METABOLIC PANEL
Anion gap: 13 (ref 5–15)
BUN: 17 mg/dL (ref 8–23)
CO2: 19 mmol/L — ABNORMAL LOW (ref 22–32)
Calcium: 8.3 mg/dL — ABNORMAL LOW (ref 8.9–10.3)
Chloride: 105 mmol/L (ref 98–111)
Creatinine, Ser: 1.45 mg/dL — ABNORMAL HIGH (ref 0.61–1.24)
GFR calc Af Amer: 52 mL/min — ABNORMAL LOW (ref >60)
GFR calc non Af Amer: 45 mL/min — ABNORMAL LOW (ref >60)
Glucose, Bld: 144 mg/dL — ABNORMAL HIGH (ref 70–99)
Potassium: 4.9 mmol/L (ref 3.5–5.1)
Sodium: 137 mmol/L (ref 135–145)

## 2018-05-16 LAB — CBG MONITORING, ED: Glucose-Capillary: 145 mg/dL — ABNORMAL HIGH (ref 70–99)

## 2018-05-16 LAB — OCCULT BLOOD X 1 CARD TO LAB, STOOL: Fecal Occult Bld: POSITIVE — AB

## 2018-05-16 MED ORDER — PANTOPRAZOLE SODIUM 40 MG IV SOLR
40.0000 mg | Freq: Once | INTRAVENOUS | Status: AC
  Administered 2018-05-16: 40 mg via INTRAVENOUS
  Filled 2018-05-16: qty 40

## 2018-05-16 MED ORDER — SODIUM CHLORIDE 0.9 % IV BOLUS
1000.0000 mL | Freq: Once | INTRAVENOUS | Status: AC
  Administered 2018-05-16: 1000 mL via INTRAVENOUS

## 2018-05-16 NOTE — ED Triage Notes (Addendum)
Pt reports 2 episodes of stool mixed with blood today. Last episode approx 1 hour pta "dark red purple". Pt on portable oxygen tank upon arrival. Sats 70s. Placed on 3L [place] and sats up to 96-97%. Dr. Tyrone Nine at bedside to eval. Pt alert

## 2018-05-16 NOTE — ED Notes (Signed)
MD made aware of patient's BP  

## 2018-05-16 NOTE — ED Provider Notes (Signed)
[facility]ERG[facility]Y DEPARTMENT Provider Note   CSN: [id number] Arrival date & time: 05/16/18  2155     History   Chief Complaint Chief Complaint  Patient presents with  . Rectal Bleeding    HPI Bradley Johns is a 84 y.o. male.  84 yo M with a chief complaint of bright red blood per rectum.  The patient has 2 episodes.  1 was at lunch and then 1 was just prior to arrival.  He is feeling a little bit weak and lightheaded.  Denies abdominal pain.  Denies vomiting.  He is on a baby aspirin, denies other blood thinner use.  The history is provided by the patient.  Rectal Bleeding  Quality:  Maroon Amount:  Moderate Duration:  5 hours Timing:  Constant Chronicity:  New Context: spontaneously   Similar prior episodes: no   Relieved by:  Nothing Worsened by:  Nothing Ineffective treatments:  None tried Associated symptoms: no abdominal pain, no fever and no vomiting     Past Medical History:  Diagnosis Date  . Arthritis   . Benign prostatic hypertrophy   . COPD (chronic obstructive pulmonary disease) ([place])    24/7 O2  . Diabetes mellitus    per Dr Posey Pronto  . Epidermal cyst of face    left lower lateral cheek  . Gout   . Hyperlipidemia   . Hypertension   . Pulmonary fibrosis ([place]) 2014  . Sleep apnea     Patient Active Problem List   Diagnosis Date Noted  . Rectal bleeding 05/16/2018  . PCP NOTES >>>>>>>>>>>>>>>. 12/30/2014  . Pulmonary fibrosis ([place]) 12/14/2012  . Cubital tunnel syndrome on left 11/14/2012  . Trigger finger of both hands 09/26/2011  . Annual physical exam 03/18/2011  . BPH (benign prostatic hyperplasia) 07/11/2008  . Osteoarthritis 04/18/2008  . Hyperlipidemia 10/06/2006  . DIABETES MELLITUS, TYPE II 04/29/2006  . GOUT 04/29/2006  . Essential hypertension 04/29/2006  . COPD with emphysema, on O2 04/29/2006  . Sleep apnea 04/29/2006    Past Surgical History:  Procedure Laterality Date  . TOE SURGERY          Home  Medications    Prior to Admission medications   Medication Sig Start Date End Date Taking? Authorizing Provider  amLODipine (NORVASC) 10 MG tablet Take 1 tablet (10 mg total) by mouth daily. 04/15/17  Yes Colon Branch, MD  aspirin 81 MG tablet Take 81 mg by mouth daily.     Yes [provider]  atorvastatin (LIPITOR) 80 MG tablet Take 1 tablet (80 mg total) by mouth daily. 04/15/17  Yes Colon Branch, MD  carvedilol (COREG) 12.5 MG tablet Take 1 tablet (12.5 mg total) by mouth 2 (two) times daily with a meal. 06/12/17  Yes Paz, Alda Berthold, MD  colchicine 0.6 MG tablet Take 1 tablet (0.6 mg total) by mouth 2 (two) times daily as needed. 12/18/17  Yes Paz, Alda Berthold, MD  doxazosin (CARDURA) 4 MG tablet Take 1 tablet (4 mg total) by mouth at bedtime. 04/27/18  Yes Paz, Alda Berthold, MD  HYDROcodone-acetaminophen (NORCO/VICODIN) 5-325 MG tablet Take 1 tablet by mouth every 8 (eight) hours as needed. 12/09/17  Yes Paz, Alda Berthold, MD  losartan (COZAAR) 50 MG tablet Take 1 tablet (50 mg total) by mouth daily. 06/12/17  Yes Paz, Alda Berthold, MD  metFORMIN (GLUCOPHAGE) 500 MG tablet Take 500 mg by mouth 2 (two) times daily with a meal.     Yes [provider]  Multiple Vitamin (MULTIVITAMIN) tablet Take 1 tablet by mouth daily.     Yes [provider]  OXYGEN Inhale into the lungs. 3L Nocturnal and PRN for Hypoxemia   Yes [provider]  pioglitazone (ACTOS) 45 MG tablet Take 45 mg by mouth daily.   Yes [provider]  ACCU-CHEK AVIVA PLUS test strip Reported on 06/20/2015 12/13/14   [provider]  ACCU-CHEK SOFTCLIX LANCETS lancets Reported on 06/20/2015 12/13/14   [provider]  B-D UF III MINI PEN NEEDLES 31G X 5 MM MISC Reported on 06/20/2015 11/07/14   [provider]  Blood Glucose Monitoring Suppl (ACCU-CHEK AVIVA PLUS) W/DEVICE KIT Reported on 06/20/2015 10/27/14   [provider]  ketoconazole (NIZORAL) 2 % cream Apply 1 application topically daily.  03/24/18   Colon Branch, MD    Family History Family History  Problem Relation Age of Onset  . Liver disease Mother   . Coronary artery disease Father 65  . Diabetes Sister   . Cancer Brother        type?  Marland Kitchen Hypertension Neg Hx   . Hyperlipidemia Neg Hx   . Heart attack Neg Hx   . Prostate cancer Neg Hx   . Colon cancer Neg Hx     Social History Social History   Tobacco Use  . Smoking status: Former Smoker    Packs/day: 1.00    Years: 30.00    Pack years: 30.00    Types: Cigarettes    Last attempt to quit: 04/07/1996    Years since quitting: 22.1  . Smokeless tobacco: Never Used  Substance Use Topics  . Alcohol use: Yes    Comment: occassional beer  . Drug use: No     Allergies   Patient has no known allergies.   Review of Systems Review of Systems  Constitutional: Negative for chills and fever.  HENT: Negative for congestion and facial swelling.   Eyes: Negative for discharge and visual disturbance.  Respiratory: Negative for shortness of breath.   Cardiovascular: Negative for chest pain and palpitations.  Gastrointestinal: Positive for blood in stool and hematochezia. Negative for abdominal pain, diarrhea and vomiting.  Musculoskeletal: Negative for arthralgias and myalgias.  Skin: Negative for color change and rash.  Neurological: Negative for tremors, syncope and headaches.  Psychiatric/Behavioral: Negative for confusion and dysphoric mood.     Physical Exam Updated Vital Signs BP (!) 110/58   Pulse (!) 58   Temp 97.8 F (36.6 C) (Oral)   Resp 13   SpO2 92%   Physical Exam Vitals signs and nursing note reviewed.  Constitutional:      Appearance: He is well-developed.  HENT:     Head: Normocephalic and atraumatic.  Eyes:     Pupils: Pupils are equal, round, and reactive to light.  Neck:     Musculoskeletal: Normal range of motion and neck supple.     Vascular: No JVD.  Cardiovascular:     Rate and Rhythm: Normal rate and regular rhythm.      Heart sounds: No murmur. No friction rub. No gallop.   Pulmonary:     Effort: No respiratory distress.     Breath sounds: No wheezing.  Abdominal:     General: There is no distension.     Tenderness: There is no guarding or rebound.  Genitourinary:    Comments: Rectal exam with bright red blood, no stool. Musculoskeletal: Normal range of motion.  Skin:  Coloration: Skin is not pale.     Findings: No rash.  Neurological:     Mental Status: He is alert and oriented to person, place, and time.  Psychiatric:        Behavior: Behavior normal.      ED Treatments / Results  Labs (all labs ordered are listed, but only abnormal results are displayed) Labs Reviewed  CBC WITH DIFFERENTIAL/PLATELET - Abnormal; Notable for the following components:      Result Value   RBC 3.92 (*)    Hemoglobin 12.0 (*)    MCV 100.8 (*)    All other components within normal limits  BASIC METABOLIC PANEL - Abnormal; Notable for the following components:   CO2 19 (*)    Glucose, Bld 144 (*)    Creatinine, Ser 1.45 (*)    Calcium 8.3 (*)    GFR calc non Af Amer 45 (*)    GFR calc Af Amer 52 (*)    All other components within normal limits  OCCULT BLOOD X 1 CARD TO LAB, STOOL - Abnormal; Notable for the following components:   Fecal Occult Bld POSITIVE (*)    All other components within normal limits  CBG MONITORING, ED - Abnormal; Notable for the following components:   Glucose-Capillary 145 (*)    All other components within normal limits  POC OCCULT BLOOD, ED    EKG None  Radiology No results found.  Procedures Procedures (including critical care time)  Medications Ordered in ED Medications  sodium chloride 0.9 % bolus 1,000 mL (0 mLs Intravenous Stopped 05/16/18 2314)  pantoprazole (PROTONIX) injection 40 mg (40 mg Intravenous Given 05/16/18 2300)     Initial Impression / Assessment and Plan / ED Course  I have reviewed the triage vital signs and the nursing notes.  Pertinent labs  & imaging results that were available during my care of the patient were reviewed by me and considered in my medical decision making (see chart for details).     84 yo M with a chief complaints of bright red blood per rectum.  The patient has had 2 episodes today appearing feeling a little bit lightheaded currently.  Blood pressure is a little bit on the low side with a systolic of 716.  We will give a bolus of fluids.  Check hemoglobin.  Will discuss with GI.  Hgb down 3g from prior in June.  BUN at his baseline.   Soft BP's improved with IV fluids.   Multiple attempts to contact GI thus far without response.   Signed out to Dr Stark Jock please see his note for further details of care in the ED.   The patients results and plan were reviewed and discussed.   Any x-rays performed were independently reviewed by myself.   Differential diagnosis were considered with the presenting HPI.  Medications  sodium chloride 0.9 % bolus 1,000 mL (0 mLs Intravenous Stopped 05/16/18 2314)  pantoprazole (PROTONIX) injection 40 mg (40 mg Intravenous Given 05/16/18 2300)    Vitals:   05/16/18 2246 05/16/18 2300 05/16/18 2315 05/16/18 2345  BP: (!) 117/59 (!) 129/57 (!) 124/59 (!) 110/58  Pulse: (!) 57 (!) 59 64 (!) 58  Resp: 16 13    Temp:      TempSrc:      SpO2: 95% 95% 94% 92%    Final diagnoses:  BRBPR (bright red blood per rectum)    Admission/ observation were discussed with the admitting physician, patient and/or family and they  are comfortable with the plan.      Final Clinical Impressions(s) / ED Diagnoses   Final diagnoses:  BRBPR (bright red blood per rectum)    ED Discharge Orders    None       Deno Etienne, DO 05/16/18 2352

## 2018-05-16 NOTE — ED Notes (Signed)
ED Provider at bedside. 

## 2018-05-16 NOTE — ED Notes (Signed)
Report given to Matt, RN

## 2018-05-17 ENCOUNTER — Encounter (HOSPITAL_COMMUNITY): Payer: Self-pay

## 2018-05-17 DIAGNOSIS — E119 Type 2 diabetes mellitus without complications: Secondary | ICD-10-CM

## 2018-05-17 DIAGNOSIS — J449 Chronic obstructive pulmonary disease, unspecified: Secondary | ICD-10-CM | POA: Diagnosis not present

## 2018-05-17 DIAGNOSIS — Z87891 Personal history of nicotine dependence: Secondary | ICD-10-CM | POA: Diagnosis not present

## 2018-05-17 DIAGNOSIS — Z79899 Other long term (current) drug therapy: Secondary | ICD-10-CM | POA: Diagnosis not present

## 2018-05-17 DIAGNOSIS — N400 Enlarged prostate without lower urinary tract symptoms: Secondary | ICD-10-CM | POA: Diagnosis not present

## 2018-05-17 DIAGNOSIS — G4730 Sleep apnea, unspecified: Secondary | ICD-10-CM | POA: Diagnosis not present

## 2018-05-17 DIAGNOSIS — K922 Gastrointestinal hemorrhage, unspecified: Secondary | ICD-10-CM | POA: Diagnosis present

## 2018-05-17 DIAGNOSIS — K5731 Diverticulosis of large intestine without perforation or abscess with bleeding: Secondary | ICD-10-CM | POA: Diagnosis not present

## 2018-05-17 DIAGNOSIS — N182 Chronic kidney disease, stage 2 (mild): Secondary | ICD-10-CM | POA: Diagnosis not present

## 2018-05-17 DIAGNOSIS — E1122 Type 2 diabetes mellitus with diabetic chronic kidney disease: Secondary | ICD-10-CM | POA: Diagnosis not present

## 2018-05-17 DIAGNOSIS — R42 Dizziness and giddiness: Secondary | ICD-10-CM | POA: Diagnosis not present

## 2018-05-17 DIAGNOSIS — Z833 Family history of diabetes mellitus: Secondary | ICD-10-CM | POA: Diagnosis not present

## 2018-05-17 DIAGNOSIS — K625 Hemorrhage of anus and rectum: Secondary | ICD-10-CM | POA: Diagnosis not present

## 2018-05-17 DIAGNOSIS — Z8249 Family history of ischemic heart disease and other diseases of the circulatory system: Secondary | ICD-10-CM | POA: Diagnosis not present

## 2018-05-17 DIAGNOSIS — J42 Unspecified chronic bronchitis: Secondary | ICD-10-CM | POA: Diagnosis not present

## 2018-05-17 DIAGNOSIS — E785 Hyperlipidemia, unspecified: Secondary | ICD-10-CM | POA: Diagnosis not present

## 2018-05-17 DIAGNOSIS — Z7982 Long term (current) use of aspirin: Secondary | ICD-10-CM | POA: Diagnosis not present

## 2018-05-17 DIAGNOSIS — I10 Essential (primary) hypertension: Secondary | ICD-10-CM | POA: Diagnosis not present

## 2018-05-17 DIAGNOSIS — J9611 Chronic respiratory failure with hypoxia: Secondary | ICD-10-CM

## 2018-05-17 DIAGNOSIS — D649 Anemia, unspecified: Secondary | ICD-10-CM | POA: Diagnosis not present

## 2018-05-17 DIAGNOSIS — K621 Rectal polyp: Secondary | ICD-10-CM | POA: Diagnosis not present

## 2018-05-17 DIAGNOSIS — Z7984 Long term (current) use of oral hypoglycemic drugs: Secondary | ICD-10-CM | POA: Diagnosis not present

## 2018-05-17 DIAGNOSIS — D500 Iron deficiency anemia secondary to blood loss (chronic): Secondary | ICD-10-CM | POA: Diagnosis not present

## 2018-05-17 DIAGNOSIS — Z9981 Dependence on supplemental oxygen: Secondary | ICD-10-CM | POA: Diagnosis not present

## 2018-05-17 DIAGNOSIS — K5730 Diverticulosis of large intestine without perforation or abscess without bleeding: Secondary | ICD-10-CM | POA: Diagnosis not present

## 2018-05-17 DIAGNOSIS — I129 Hypertensive chronic kidney disease with stage 1 through stage 4 chronic kidney disease, or unspecified chronic kidney disease: Secondary | ICD-10-CM | POA: Diagnosis not present

## 2018-05-17 LAB — CBC
HCT: 33.1 % — ABNORMAL LOW (ref 39.0–52.0)
HCT: 37.4 % — ABNORMAL LOW (ref 39.0–52.0)
Hemoglobin: 10.2 g/dL — ABNORMAL LOW (ref 13.0–17.0)
Hemoglobin: 11.6 g/dL — ABNORMAL LOW (ref 13.0–17.0)
MCH: 31.7 pg (ref 26.0–34.0)
MCH: 31.7 pg (ref 26.0–34.0)
MCHC: 30.8 g/dL (ref 30.0–36.0)
MCHC: 31 g/dL (ref 30.0–36.0)
MCV: 102.8 fL — ABNORMAL HIGH (ref 80.0–100.0)
MCV: 102.2 fL — ABNORMAL HIGH (ref 80.0–100.0)
Platelets: 152 10*3/uL (ref 150–400)
Platelets: 160 10*3/uL (ref 150–400)
RBC: 3.22 MIL/uL — ABNORMAL LOW (ref 4.22–5.81)
RBC: 3.66 MIL/uL — ABNORMAL LOW (ref 4.22–5.81)
RDW: 14.2 % (ref 11.5–15.5)
RDW: 14.3 % (ref 11.5–15.5)
WBC: 6 10*3/uL (ref 4.0–10.5)
WBC: 8.2 10*3/uL (ref 4.0–10.5)
nRBC: 0 % (ref 0.0–0.2)
nRBC: 0 % (ref 0.0–0.2)

## 2018-05-17 LAB — GLUCOSE, CAPILLARY
Glucose-Capillary: 85 mg/dL (ref 70–99)
Glucose-Capillary: 99 mg/dL (ref 70–99)

## 2018-05-17 LAB — CBG MONITORING, ED: Glucose-Capillary: 78 mg/dL (ref 70–99)

## 2018-05-17 LAB — HEMOGLOBIN AND HEMATOCRIT, BLOOD
HCT: 35.3 % — ABNORMAL LOW (ref 39.0–52.0)
Hemoglobin: 10.9 g/dL — ABNORMAL LOW (ref 13.0–17.0)

## 2018-05-17 MED ORDER — INSULIN ASPART 100 UNIT/ML ~~LOC~~ SOLN: 0.0000 [IU] | Freq: Three times a day (TID) | SUBCUTANEOUS | Status: DC

## 2018-05-17 MED ORDER — IPRATROPIUM-ALBUTEROL 0.5-2.5 (3) MG/3ML IN SOLN: 3.0000 mL | RESPIRATORY_TRACT | Status: DC | PRN

## 2018-05-17 MED ORDER — ATORVASTATIN CALCIUM 40 MG PO TABS
80.0000 mg | ORAL_TABLET | Freq: Every evening | ORAL | Status: DC
  Administered 2018-05-17: 80 mg via ORAL
  Filled 2018-05-17: qty 2

## 2018-05-17 MED ORDER — DOXAZOSIN MESYLATE 4 MG PO TABS
4.0000 mg | ORAL_TABLET | Freq: Every day | ORAL | Status: DC
  Administered 2018-05-17: 4 mg via ORAL
  Filled 2018-05-17 (×2): qty 1

## 2018-05-17 MED ORDER — HYDROCODONE-ACETAMINOPHEN 5-325 MG PO TABS: 1.0000 | ORAL_TABLET | Freq: Three times a day (TID) | ORAL | Status: DC | PRN

## 2018-05-17 MED ORDER — SODIUM CHLORIDE 0.9 % IV SOLN: INTRAVENOUS | Status: DC

## 2018-05-17 MED ORDER — PEG 3350-KCL-NA BICARB-NACL 420 G PO SOLR
4000.0000 mL | Freq: Once | ORAL | Status: AC
  Administered 2018-05-17: 4000 mL via ORAL

## 2018-05-17 MED ORDER — SODIUM CHLORIDE 0.9 % IV SOLN
INTRAVENOUS | Status: AC
  Administered 2018-05-17 – 2018-05-18 (×3): via INTRAVENOUS

## 2018-05-17 MED ORDER — CARVEDILOL 12.5 MG PO TABS
12.5000 mg | ORAL_TABLET | Freq: Two times a day (BID) | ORAL | Status: DC
  Administered 2018-05-17 – 2018-05-18 (×2): 12.5 mg via ORAL
  Filled 2018-05-17 (×2): qty 1

## 2018-05-17 NOTE — ED Notes (Signed)
Pt sitting up in chair.  No acute distress noted.  Belongings packed for patient.

## 2018-05-17 NOTE — Consult Note (Signed)
Reason for Consult: Hematochezia Referring Physician: [facility]  [name] HPI: This is an 84 year old male with a PMH of COPD, pulmonary fibrosis, DM, and HTN admitted for painless hematochezia x2.  At home he had a bout of dark maroon stool and then a second episodes.  He denied any abdominal pain associated with the maroon stools, but he was concerned and presented to the ER.  His HGB upon admission was 12.0 g/dL and then it dropped down to 10.0 g/dL.  He was previously noted to have a baseline HGB in the 15 g/dL range.  The patient did feel weak and lightheaded at home.  There was no report of any chest pain or SOB.  Overall he feels well, currently.  His last colonoscopy with Dr. Collene Mares was 07/25/2009 and a tubular adenoma was found.  A five year follow up was recommended.  Past Medical History:  Diagnosis Date  . Arthritis   . Benign prostatic hypertrophy   . COPD (chronic obstructive pulmonary disease) ([place])    24/7 O2  . Diabetes mellitus    per Dr Posey Pronto  . Epidermal cyst of face    left lower lateral cheek  . Gout   . Hyperlipidemia   . Hypertension   . Pulmonary fibrosis ([place]) 2014  . Sleep apnea     Past Surgical History:  Procedure Laterality Date  . TOE SURGERY      Family History  Problem Relation Age of Onset  . Liver disease Mother   . Coronary artery disease Father 70  . Diabetes Sister   . Cancer Brother        type?  Marland Kitchen Hypertension Neg Hx   . Hyperlipidemia Neg Hx   . Heart attack Neg Hx   . Prostate cancer Neg Hx   . Colon cancer Neg Hx     Social History:  reports that he quit smoking about 22 years ago. His smoking use included cigarettes. He has a 30.00 pack-year smoking history. He has never used smokeless tobacco. He reports current alcohol use. He reports that he does not use drugs.  Allergies: No Known Allergies  Medications:  Scheduled: . atorvastatin  80 mg Oral QPM  . carvedilol  12.5 mg Oral BID WC  . doxazosin  4 mg Oral QHS   . insulin aspart  0-9 Units Subcutaneous TID WC   Continuous: . sodium chloride 75 mL/hr at 05/17/18 1000    Results for orders placed or performed during the hospital encounter of 05/16/18 (from the past 24 hour(s))  Occult blood card to lab, stool     Status: Abnormal   Collection Time: 05/16/18 10:11 PM  Result Value Ref Range   Fecal Occult Bld POSITIVE (A) NEGATIVE  CBG monitoring, ED     Status: Abnormal   Collection Time: 05/16/18 10:15 PM  Result Value Ref Range   Glucose-Capillary 145 (H) 70 - 99 mg/dL  CBC with Differential     Status: Abnormal   Collection Time: 05/16/18 10:38 PM  Result Value Ref Range   WBC 8.2 4.0 - 10.5 K/uL   RBC 3.92 (L) 4.22 - 5.81 MIL/uL   Hemoglobin 12.0 (L) 13.0 - 17.0 g/dL   HCT 39.5 39.0 - 52.0 %   MCV 100.8 (H) 80.0 - 100.0 fL   MCH 30.6 26.0 - 34.0 pg   MCHC 30.4 30.0 - 36.0 g/dL   RDW 14.0 11.5 - 15.5 %   Platelets 163 150 - 400 K/uL  nRBC 0.0 0.0 - 0.2 %   Neutrophils Relative % 70 %   Neutro Abs 5.7 1.7 - 7.7 K/uL   Lymphocytes Relative 20 %   Lymphs Abs 1.6 0.7 - 4.0 K/uL   Monocytes Relative 8 %   Monocytes Absolute 0.7 0.1 - 1.0 K/uL   Eosinophils Relative 2 %   Eosinophils Absolute 0.2 0.0 - 0.5 K/uL   Basophils Relative 0 %   Basophils Absolute 0.0 0.0 - 0.1 K/uL   Immature Granulocytes 0 %   Abs Immature Granulocytes 0.02 0.00 - 0.07 K/uL  Basic metabolic panel     Status: Abnormal   Collection Time: 05/16/18 10:38 PM  Result Value Ref Range   Sodium 137 135 - 145 mmol/L   Potassium 4.9 3.5 - 5.1 mmol/L   Chloride 105 98 - 111 mmol/L   CO2 19 (L) 22 - 32 mmol/L   Glucose, Bld 144 (H) 70 - 99 mg/dL   BUN 17 8 - 23 mg/dL   Creatinine, Ser 1.45 (H) 0.61 - 1.24 mg/dL   Calcium 8.3 (L) 8.9 - 10.3 mg/dL   GFR calc non Af Amer 45 (L) >60 mL/min   GFR calc Af Amer 52 (L) >60 mL/min   Anion gap 13 5 - 15  Hemoglobin and hematocrit, blood     Status: Abnormal   Collection Time: 05/17/18  6:42 AM  Result Value Ref  Range   Hemoglobin 10.9 (L) 13.0 - 17.0 g/dL   HCT 35.3 (L) 39.0 - 52.0 %  CBG monitoring, ED     Status: None   Collection Time: 05/17/18  8:15 AM  Result Value Ref Range   Glucose-Capillary 78 70 - 99 mg/dL  Glucose, capillary     Status: None   Collection Time: 05/17/18 11:22 AM  Result Value Ref Range   Glucose-Capillary 85 70 - 99 mg/dL   Comment 1 QC Due      No results found.  ROS:  As stated above in the HPI otherwise negative.  Blood pressure (!) 165/68, pulse 71, temperature 97.7 F (36.5 C), temperature source Oral, resp. rate 12, height 5\' 8"  (1.727 m), weight 92.4 kg, SpO2 92 %.    PE: Gen: NAD, Alert and Oriented HEENT:  [place]/AT, EOMI Neck: Supple, no LAD Lungs: CTA Bilaterally CV: RRR without M/G/R ABM: Soft, NTND, +BS Ext: No C/C/E  Assessment/Plan: 1) Hematochezia. 2) Diverticula. 3) Anemia. 4) COPD on chronic oxygen.   The patient's colonoscopy in 2005 was positive for diverticula.  His clinical presentation is consistent with a diverticular bleed, however, his last procedure was 9 years ago.  It is reasonable to repeat the procedure at this time.  Plan: 1) Colonoscopy tomorrow.  Markeem Noreen D 05/17/2018, 1:31 PM

## 2018-05-17 NOTE — H&P (Signed)
History and Physical    Bradley Johns:[id number] DOB: 08/12/1934 DOA: 05/16/2018  I have briefly reviewed the patient's prior medical records in [place]  PCP: Colon Branch, MD  Patient coming from: Home  Chief Complaint: Rectal bleed  HPI: Bradley Johns is a 84 y.o. male with medical history significant of COPD and chronic hypoxic respiratory failure on home oxygen, HTN, HLD, DM who presents to the hospital with chief complaint of rectal bleed.  Patient tells me that since yesterday he has had 2 bowel movements in a chair accompanied by large amounts of blood which was maroon/darker in appearance.  The bleeding seemed to have been much larger on the second time.  He is also been complaining of lightheadedness and dizziness.  He denies this ever happening to him in the past.  He reports taking aspirin but no other blood thinners or over-the-counter medications.  He quit smoking about 25 years ago.  He is not a drinker.  He denies any abdominal pain, has no nausea or vomiting.  Denies any chest pain or shortness of breath and otherwise he is feeling at baseline  ED Course: In the emergency room he is afebrile, blood pressure has been stable, he is not tachycardic and satting well on chronic home O2.  Blood work shows a creatinine of 1.4, hemoglobin of 12 on admission.  Fecal occult was positive.  We are asked to admit for GI bleed  Review of Systems: As per HPI otherwise 10 point review of systems negative.   Past Medical History:  Diagnosis Date  . Arthritis   . Benign prostatic hypertrophy   . COPD (chronic obstructive pulmonary disease) ([place])    24/7 O2  . Diabetes mellitus    per Dr Posey Pronto  . Epidermal cyst of face    left lower lateral cheek  . Gout   . Hyperlipidemia   . Hypertension   . Pulmonary fibrosis ([place]) 2014  . Sleep apnea     Past Surgical History:  Procedure Laterality Date  . TOE SURGERY       reports that he quit smoking about 22 years ago. His  smoking use included cigarettes. He has a 30.00 pack-year smoking history. He has never used smokeless tobacco. He reports current alcohol use. He reports that he does not use drugs.  No Known Allergies  Family History  Problem Relation Age of Onset  . Liver disease Mother   . Coronary artery disease Father 69  . Diabetes Sister   . Cancer Brother        type?  Marland Kitchen Hypertension Neg Hx   . Hyperlipidemia Neg Hx   . Heart attack Neg Hx   . Prostate cancer Neg Hx   . Colon cancer Neg Hx     Prior to Admission medications   Medication Sig Start Date End Date Taking? Authorizing Provider  amLODipine (NORVASC) 10 MG tablet Take 1 tablet (10 mg total) by mouth daily. 04/15/17  Yes Colon Branch, MD  aspirin 81 MG tablet Take 81 mg by mouth daily.     Yes [provider]  atorvastatin (LIPITOR) 80 MG tablet Take 1 tablet (80 mg total) by mouth daily. 04/15/17  Yes Colon Branch, MD  carvedilol (COREG) 12.5 MG tablet Take 1 tablet (12.5 mg total) by mouth 2 (two) times daily with a meal. 06/12/17  Yes Paz, Alda Berthold, MD  colchicine 0.6 MG tablet Take 1 tablet (0.6 mg total) by mouth  2 (two) times daily as needed. 12/18/17  Yes Paz, Alda Berthold, MD  doxazosin (CARDURA) 4 MG tablet Take 1 tablet (4 mg total) by mouth at bedtime. 04/27/18  Yes Paz, Alda Berthold, MD  HYDROcodone-acetaminophen (NORCO/VICODIN) 5-325 MG tablet Take 1 tablet by mouth every 8 (eight) hours as needed. 12/09/17  Yes Paz, Alda Berthold, MD  losartan (COZAAR) 50 MG tablet Take 1 tablet (50 mg total) by mouth daily. 06/12/17  Yes Paz, Alda Berthold, MD  metFORMIN (GLUCOPHAGE) 500 MG tablet Take 500 mg by mouth 2 (two) times daily with a meal.     Yes [provider]  Multiple Vitamin (MULTIVITAMIN) tablet Take 1 tablet by mouth daily.     Yes [provider]  OXYGEN Inhale into the lungs. 3L Nocturnal and PRN for Hypoxemia   Yes [provider]  pioglitazone (ACTOS) 45 MG tablet Take 45 mg by mouth daily.   Yes [provider]  ACCU-CHEK AVIVA PLUS test strip Reported on 06/20/2015 12/13/14   [provider]  ACCU-CHEK SOFTCLIX LANCETS lancets Reported on 06/20/2015 12/13/14   [provider]  B-D UF III MINI PEN NEEDLES 31G X 5 MM MISC Reported on 06/20/2015 11/07/14   [provider]  Blood Glucose Monitoring Suppl (ACCU-CHEK AVIVA PLUS) W/DEVICE KIT Reported on 06/20/2015 10/27/14   [provider]  ketoconazole (NIZORAL) 2 % cream Apply 1 application topically daily. 03/24/18   Colon Branch, MD    Physical Exam: Vitals:   05/17/18 0600 05/17/18 0700 05/17/18 0800 05/17/18 0900  BP: (!) 122/49 (!) 133/55 (!) 130/54 (!) 165/68  Pulse: (!) 54 (!) 59 (!) 58 71  Resp: '11 12 12   '$ Temp:    97.7 F (36.5 C)  TempSrc:    Oral  SpO2: 97% 96% 94% 92%  Weight:    92.4 kg  Height:    '5\' 8"'$  (1.727 m)    Constitutional: NAD, calm, comfortable Eyes: PERRL, lids and conjunctivae normal ENMT: Mucous membranes are moist. Neck: normal, supple Respiratory: clear to auscultation bilaterally, no wheezing, no crackles. Normal respiratory effort. No accessory muscle use.  Cardiovascular: Regular rate and rhythm, no murmurs / rubs / gallops. No extremity edema. 2+ pedal pulses.  Abdomen: no tenderness, no masses palpated. Bowel sounds positive.  Musculoskeletal: no clubbing / cyanosis. Normal muscle tone.  Skin: no rashes, lesions, ulcers. No induration Neurologic: CN 2-12 grossly intact. Strength 5/5 in all 4.  Psychiatric: Normal judgment and insight. Alert and oriented x 3. Normal mood.   Labs on Admission: I have personally reviewed following labs and imaging studies  CBC: Recent Labs  Lab 05/16/18 2238 05/17/18 0642  WBC 8.2  --   NEUTROABS 5.7  --   HGB 12.0* 10.9*  HCT 39.5 35.3*  MCV 100.8*  --   PLT 163  --    Basic Metabolic Panel: Recent Labs  Lab 05/16/18 2238  NA 137  K 4.9  CL 105  CO2 19*  GLUCOSE 144*  BUN 17  CREATININE 1.45*  CALCIUM 8.3*   GFR: Estimated  Creatinine Clearance: 44.1 mL/min (A) (by C-G formula based on SCr of 1.45 mg/dL (H)). Liver Function Tests: No results for input(s): AST, ALT, ALKPHOS, BILITOT, PROT, ALBUMIN in the last 168 hours. No results for input(s): LIPASE, AMYLASE in the last 168 hours. No results for input(s): AMMONIA in the last 168 hours. Coagulation Profile: No results for input(s): INR, PROTIME in the last 168 hours. Cardiac Enzymes: No  results for input(s): CKTOTAL, CKMB, CKMBINDEX, TROPONINI in the last 168 hours. BNP (last 3 results) No results for input(s): PROBNP in the last 8760 hours. HbA1C: No results for input(s): HGBA1C in the last 72 hours. CBG: Recent Labs  Lab 05/16/18 2215 05/17/18 0815  GLUCAP 145* 78   Lipid Profile: No results for input(s): CHOL, HDL, LDLCALC, TRIG, CHOLHDL, LDLDIRECT in the last 72 hours. Thyroid Function Tests: No results for input(s): TSH, T4TOTAL, FREET4, T3FREE, THYROIDAB in the last 72 hours. Anemia Panel: No results for input(s): VITAMINB12, FOLATE, FERRITIN, TIBC, IRON, RETICCTPCT in the last 72 hours. Urine analysis:    Component Value Date/Time   COLORURINE YELLOW 10/16/2014 [place] 10/16/2014 1203   LABSPEC 1.020 10/16/2014 1203   PHURINE 6.0 10/16/2014 1203   GLUCOSEU NEGATIVE 10/16/2014 1203   HGBUR NEGATIVE 10/16/2014 1203   HGBUR negative 10/06/2006 0756   BILIRUBINUR negative 09/09/2017 1217   KETONESUR NEGATIVE 10/16/2014 1203   PROTEINUR Positive (A) 09/09/2017 1217   UROBILINOGEN 0.2 09/09/2017 1217   UROBILINOGEN 1.0 10/16/2014 1203   NITRITE negative 09/09/2017 1217   NITRITE NEGATIVE 10/16/2014 1203   LEUKOCYTESUR Negative 09/09/2017 1217     Radiological Exams on Admission: No results found.   Assessment/Plan Active Problems:   Rectal bleeding   GI bleed   Principal Problem GI bleed -Patient with maroon/darker stools, does not classically fit diverticular bleed but certainly possible.  Gastroenterology  was consulted, I have discussed with Dr. Benson Norway who will see patient in consultation.  We will allow clear liquid diet up until he is seen -Continue to monitor CBC every 8 hours  Active Problems Hypertension -Keep on Coreg but hold losartan and amlodipine, blood pressure currently stable  COPD with chronic hypoxic respiratory failure -No wheezing, this appears stable and at baseline.  Continue to closely monitor.  Place on DuoNebs as needed  Chronic kidney disease stage II -Creatinine varying between 1.0-1.3, currently at 1.4.  Gentle hydration and repeat tomorrow morning  Type 2 diabetes mellitus -Hold home agents, placed on sliding scale  Hyperlipidemia -Continue statin   DVT prophylaxis: SCDs  Code Status: Full code  Family Communication: no family at bedside  Disposition Plan: home when ready  Consults called: GI, Dr. Francee Piccolo, MD, PhD [facility]  Contact via [email or web address]  [facility] Office Info P: [phone]  F: [phone]   05/17/2018, 9:16 AM

## 2018-05-17 NOTE — H&P (View-Only) (Signed)
Reason for Consult: Hematochezia Referring Physician: [facility]  [name] HPI: This is an 84 year old male with a PMH of COPD, pulmonary fibrosis, DM, and HTN admitted for painless hematochezia x2.  At home he had a bout of dark maroon stool and then a second episodes.  He denied any abdominal pain associated with the maroon stools, but he was concerned and presented to the ER.  His HGB upon admission was 12.0 g/dL and then it dropped down to 10.0 g/dL.  He was previously noted to have a baseline HGB in the 15 g/dL range.  The patient did feel weak and lightheaded at home.  There was no report of any chest pain or SOB.  Overall he feels well, currently.  His last colonoscopy with Dr. Collene Mares was 07/25/2009 and a tubular adenoma was found.  A five year follow up was recommended.  Past Medical History:  Diagnosis Date  . Arthritis   . Benign prostatic hypertrophy   . COPD (chronic obstructive pulmonary disease) ([place])    24/7 O2  . Diabetes mellitus    per Dr Posey Pronto  . Epidermal cyst of face    left lower lateral cheek  . Gout   . Hyperlipidemia   . Hypertension   . Pulmonary fibrosis ([place]) 2014  . Sleep apnea     Past Surgical History:  Procedure Laterality Date  . TOE SURGERY      Family History  Problem Relation Age of Onset  . Liver disease Mother   . Coronary artery disease Father 47  . Diabetes Sister   . Cancer Brother        type?  Marland Kitchen Hypertension Neg Hx   . Hyperlipidemia Neg Hx   . Heart attack Neg Hx   . Prostate cancer Neg Hx   . Colon cancer Neg Hx     Social History:  reports that he quit smoking about 22 years ago. His smoking use included cigarettes. He has a 30.00 pack-year smoking history. He has never used smokeless tobacco. He reports current alcohol use. He reports that he does not use drugs.  Allergies: No Known Allergies  Medications:  Scheduled: . atorvastatin  80 mg Oral QPM  . carvedilol  12.5 mg Oral BID WC  . doxazosin  4 mg Oral QHS   . insulin aspart  0-9 Units Subcutaneous TID WC   Continuous: . sodium chloride 75 mL/hr at 05/17/18 1000    Results for orders placed or performed during the hospital encounter of 05/16/18 (from the past 24 hour(s))  Occult blood card to lab, stool     Status: Abnormal   Collection Time: 05/16/18 10:11 PM  Result Value Ref Range   Fecal Occult Bld POSITIVE (A) NEGATIVE  CBG monitoring, ED     Status: Abnormal   Collection Time: 05/16/18 10:15 PM  Result Value Ref Range   Glucose-Capillary 145 (H) 70 - 99 mg/dL  CBC with Differential     Status: Abnormal   Collection Time: 05/16/18 10:38 PM  Result Value Ref Range   WBC 8.2 4.0 - 10.5 K/uL   RBC 3.92 (L) 4.22 - 5.81 MIL/uL   Hemoglobin 12.0 (L) 13.0 - 17.0 g/dL   HCT 39.5 39.0 - 52.0 %   MCV 100.8 (H) 80.0 - 100.0 fL   MCH 30.6 26.0 - 34.0 pg   MCHC 30.4 30.0 - 36.0 g/dL   RDW 14.0 11.5 - 15.5 %   Platelets 163 150 - 400 K/uL  nRBC 0.0 0.0 - 0.2 %   Neutrophils Relative % 70 %   Neutro Abs 5.7 1.7 - 7.7 K/uL   Lymphocytes Relative 20 %   Lymphs Abs 1.6 0.7 - 4.0 K/uL   Monocytes Relative 8 %   Monocytes Absolute 0.7 0.1 - 1.0 K/uL   Eosinophils Relative 2 %   Eosinophils Absolute 0.2 0.0 - 0.5 K/uL   Basophils Relative 0 %   Basophils Absolute 0.0 0.0 - 0.1 K/uL   Immature Granulocytes 0 %   Abs Immature Granulocytes 0.02 0.00 - 0.07 K/uL  Basic metabolic panel     Status: Abnormal   Collection Time: 05/16/18 10:38 PM  Result Value Ref Range   Sodium 137 135 - 145 mmol/L   Potassium 4.9 3.5 - 5.1 mmol/L   Chloride 105 98 - 111 mmol/L   CO2 19 (L) 22 - 32 mmol/L   Glucose, Bld 144 (H) 70 - 99 mg/dL   BUN 17 8 - 23 mg/dL   Creatinine, Ser 1.45 (H) 0.61 - 1.24 mg/dL   Calcium 8.3 (L) 8.9 - 10.3 mg/dL   GFR calc non Af Amer 45 (L) >60 mL/min   GFR calc Af Amer 52 (L) >60 mL/min   Anion gap 13 5 - 15  Hemoglobin and hematocrit, blood     Status: Abnormal   Collection Time: 05/17/18  6:42 AM  Result Value Ref  Range   Hemoglobin 10.9 (L) 13.0 - 17.0 g/dL   HCT 35.3 (L) 39.0 - 52.0 %  CBG monitoring, ED     Status: None   Collection Time: 05/17/18  8:15 AM  Result Value Ref Range   Glucose-Capillary 78 70 - 99 mg/dL  Glucose, capillary     Status: None   Collection Time: 05/17/18 11:22 AM  Result Value Ref Range   Glucose-Capillary 85 70 - 99 mg/dL   Comment 1 QC Due      No results found.  ROS:  As stated above in the HPI otherwise negative.  Blood pressure (!) 165/68, pulse 71, temperature 97.7 F (36.5 C), temperature source Oral, resp. rate 12, height 5\' 8"  (1.727 m), weight 92.4 kg, SpO2 92 %.    PE: Gen: NAD, Alert and Oriented HEENT:  [place]/AT, EOMI Neck: Supple, no LAD Lungs: CTA Bilaterally CV: RRR without M/G/R ABM: Soft, NTND, +BS Ext: No C/C/E  Assessment/Plan: 1) Hematochezia. 2) Diverticula. 3) Anemia. 4) COPD on chronic oxygen.   The patient's colonoscopy in 2005 was positive for diverticula.  His clinical presentation is consistent with a diverticular bleed, however, his last procedure was 9 years ago.  It is reasonable to repeat the procedure at this time.  Plan: 1) Colonoscopy tomorrow.  Harshitha Fretz D 05/17/2018, 1:31 PM

## 2018-05-17 NOTE — Plan of Care (Signed)

## 2018-05-17 NOTE — ED Notes (Signed)
Per other RN:  Pt up to restroom.  Pt ambulated well.  Pt returned, Pox 80's.  Oxygen reapplied at 2Lper [place].  Pt denies sob.  Monitor noted SR.

## 2018-05-17 NOTE — ED Notes (Signed)
Marg[facility] [phone] Cell [phone]

## 2018-05-18 ENCOUNTER — Inpatient Hospital Stay (HOSPITAL_COMMUNITY): Payer: Medicare Other | Admitting: Anesthesiology

## 2018-05-18 ENCOUNTER — Encounter (HOSPITAL_COMMUNITY): Payer: Self-pay | Admitting: *Deleted

## 2018-05-18 ENCOUNTER — Encounter (HOSPITAL_COMMUNITY)
Admission: EM | Disposition: A | Discharge status: Home / Self Care | Payer: Self-pay | Source: Home / Self Care | Attending: Internal Medicine

## 2018-05-18 DIAGNOSIS — K625 Hemorrhage of anus and rectum: Secondary | ICD-10-CM

## 2018-05-18 DIAGNOSIS — J9611 Chronic respiratory failure with hypoxia: Secondary | ICD-10-CM | POA: Diagnosis not present

## 2018-05-18 DIAGNOSIS — N182 Chronic kidney disease, stage 2 (mild): Secondary | ICD-10-CM

## 2018-05-18 DIAGNOSIS — D500 Iron deficiency anemia secondary to blood loss (chronic): Secondary | ICD-10-CM | POA: Diagnosis not present

## 2018-05-18 DIAGNOSIS — E1122 Type 2 diabetes mellitus with diabetic chronic kidney disease: Secondary | ICD-10-CM | POA: Diagnosis not present

## 2018-05-18 DIAGNOSIS — K635 Polyp of colon: Secondary | ICD-10-CM | POA: Diagnosis not present

## 2018-05-18 DIAGNOSIS — J42 Unspecified chronic bronchitis: Secondary | ICD-10-CM | POA: Diagnosis not present

## 2018-05-18 DIAGNOSIS — Z9981 Dependence on supplemental oxygen: Secondary | ICD-10-CM | POA: Diagnosis not present

## 2018-05-18 DIAGNOSIS — I10 Essential (primary) hypertension: Secondary | ICD-10-CM | POA: Diagnosis not present

## 2018-05-18 DIAGNOSIS — I129 Hypertensive chronic kidney disease with stage 1 through stage 4 chronic kidney disease, or unspecified chronic kidney disease: Secondary | ICD-10-CM | POA: Diagnosis not present

## 2018-05-18 DIAGNOSIS — K922 Gastrointestinal hemorrhage, unspecified: Secondary | ICD-10-CM | POA: Diagnosis not present

## 2018-05-18 DIAGNOSIS — Z87891 Personal history of nicotine dependence: Secondary | ICD-10-CM | POA: Diagnosis not present

## 2018-05-18 DIAGNOSIS — K621 Rectal polyp: Secondary | ICD-10-CM | POA: Diagnosis not present

## 2018-05-18 DIAGNOSIS — K5731 Diverticulosis of large intestine without perforation or abscess with bleeding: Secondary | ICD-10-CM | POA: Diagnosis not present

## 2018-05-18 DIAGNOSIS — E119 Type 2 diabetes mellitus without complications: Secondary | ICD-10-CM | POA: Diagnosis not present

## 2018-05-18 DIAGNOSIS — K5730 Diverticulosis of large intestine without perforation or abscess without bleeding: Secondary | ICD-10-CM | POA: Diagnosis not present

## 2018-05-18 DIAGNOSIS — E785 Hyperlipidemia, unspecified: Secondary | ICD-10-CM | POA: Diagnosis not present

## 2018-05-18 DIAGNOSIS — D128 Benign neoplasm of rectum: Secondary | ICD-10-CM | POA: Diagnosis not present

## 2018-05-18 DIAGNOSIS — J449 Chronic obstructive pulmonary disease, unspecified: Secondary | ICD-10-CM | POA: Diagnosis not present

## 2018-05-18 HISTORY — PX: POLYPECTOMY: SHX5525

## 2018-05-18 HISTORY — PX: COLONOSCOPY WITH PROPOFOL: SHX5780

## 2018-05-18 LAB — CBC
HCT: 35.3 % — ABNORMAL LOW (ref 39.0–52.0)
HCT: 36.5 % — ABNORMAL LOW (ref 39.0–52.0)
Hemoglobin: 10.8 g/dL — ABNORMAL LOW (ref 13.0–17.0)
Hemoglobin: 11 g/dL — ABNORMAL LOW (ref 13.0–17.0)
MCH: 30.8 pg (ref 26.0–34.0)
MCH: 30.3 pg (ref 26.0–34.0)
MCHC: 30.6 g/dL (ref 30.0–36.0)
MCHC: 30.1 g/dL (ref 30.0–36.0)
MCV: 100.6 fL — ABNORMAL HIGH (ref 80.0–100.0)
MCV: 100.6 fL — ABNORMAL HIGH (ref 80.0–100.0)
Platelets: 168 10*3/uL (ref 150–400)
Platelets: 177 10*3/uL (ref 150–400)
RBC: 3.51 MIL/uL — ABNORMAL LOW (ref 4.22–5.81)
RBC: 3.63 MIL/uL — ABNORMAL LOW (ref 4.22–5.81)
RDW: 14.2 % (ref 11.5–15.5)
RDW: 14.2 % (ref 11.5–15.5)
WBC: 5.6 10*3/uL (ref 4.0–10.5)
WBC: 5.7 10*3/uL (ref 4.0–10.5)
nRBC: 0 % (ref 0.0–0.2)
nRBC: 0 % (ref 0.0–0.2)

## 2018-05-18 LAB — COMPREHENSIVE METABOLIC PANEL
ALT: 17 U/L (ref 0–44)
AST: 55 U/L — ABNORMAL HIGH (ref 15–41)
Albumin: 3.5 g/dL (ref 3.5–5.0)
Alkaline Phosphatase: 47 U/L (ref 38–126)
Anion gap: 8 (ref 5–15)
BUN: 12 mg/dL (ref 8–23)
CO2: 26 mmol/L (ref 22–32)
Calcium: 8.2 mg/dL — ABNORMAL LOW (ref 8.9–10.3)
Chloride: 104 mmol/L (ref 98–111)
Creatinine, Ser: 1.05 mg/dL (ref 0.61–1.24)
GFR calc Af Amer: >60 mL/min (ref >60)
GFR calc non Af Amer: >60 mL/min (ref >60)
Glucose, Bld: 106 mg/dL — ABNORMAL HIGH (ref 70–99)
Potassium: 4.3 mmol/L (ref 3.5–5.1)
Sodium: 138 mmol/L (ref 135–145)
Total Bilirubin: 1.4 mg/dL — ABNORMAL HIGH (ref 0.3–1.2)
Total Protein: 6.2 g/dL — ABNORMAL LOW (ref 6.5–8.1)

## 2018-05-18 LAB — GLUCOSE, CAPILLARY
Glucose-Capillary: 100 mg/dL — ABNORMAL HIGH (ref 70–99)
Glucose-Capillary: 100 mg/dL — ABNORMAL HIGH (ref 70–99)
Glucose-Capillary: 90 mg/dL (ref 70–99)

## 2018-05-18 SURGERY — COLONOSCOPY WITH PROPOFOL
Anesthesia: Monitor Anesthesia Care

## 2018-05-18 MED ORDER — PROPOFOL 10 MG/ML IV BOLUS
INTRAVENOUS | Status: AC
  Filled 2018-05-18: qty 20

## 2018-05-18 MED ORDER — LIDOCAINE 2% (20 MG/ML) 5 ML SYRINGE
INTRAMUSCULAR | Status: DC | PRN
  Administered 2018-05-18: 80 mg via INTRAVENOUS

## 2018-05-18 MED ORDER — PROPOFOL 10 MG/ML IV BOLUS
INTRAVENOUS | Status: DC | PRN
  Administered 2018-05-18: 20 mg via INTRAVENOUS

## 2018-05-18 MED ORDER — LACTATED RINGERS IV SOLN
INTRAVENOUS | Status: DC
  Administered 2018-05-18: 1000 mL via INTRAVENOUS
  Administered 2018-05-18: 14:00:00 via INTRAVENOUS

## 2018-05-18 MED ORDER — PROPOFOL 500 MG/50ML IV EMUL
INTRAVENOUS | Status: DC | PRN
  Administered 2018-05-18: 125 ug/kg/min via INTRAVENOUS

## 2018-05-18 MED ORDER — PHENYLEPHRINE 40 MCG/ML (10ML) SYRINGE FOR IV PUSH (FOR BLOOD PRESSURE SUPPORT)
PREFILLED_SYRINGE | INTRAVENOUS | Status: DC | PRN
  Administered 2018-05-18 (×12): 80 ug via INTRAVENOUS

## 2018-05-18 SURGICAL SUPPLY — 22 items

## 2018-05-18 NOTE — Anesthesia Postprocedure Evaluation (Signed)
Anesthesia Post Note  Patient: EJAY LASHLEY  Procedure(s) Performed: COLONOSCOPY WITH PROPOFOL (N/A ) POLYPECTOMY     Patient location during evaluation: PACU Anesthesia Type: MAC Level of consciousness: awake and alert Pain management: pain level controlled Vital Signs Assessment: post-procedure vital signs reviewed and stable Respiratory status: spontaneous breathing, nonlabored ventilation, respiratory function stable and patient connected to nasal cannula oxygen Cardiovascular status: stable and blood pressure returned to baseline Postop Assessment: no apparent nausea or vomiting Anesthetic complications: no    Last Vitals:  Vitals:   05/18/18 1444 05/18/18 1557  BP: (!) 140/28 140/62  Pulse: 78   Resp: 15   Temp:    SpO2: 100%     Last Pain:  Vitals:   05/18/18 1444  TempSrc:   PainSc: 0-No pain                 Tavares Levinson P Tom Macpherson

## 2018-05-18 NOTE — Interval H&P Note (Signed)
History and Physical Interval Note:  05/18/2018 1:36 PM  Bradley Johns  has presented today for surgery, with the diagnosis of Hematochezia  The various methods of treatment have been discussed with the patient and family. After consideration of risks, benefits and other options for treatment, the patient has consented to  Procedure(s): COLONOSCOPY WITH PROPOFOL (N/A) as a surgical intervention .  The patient's history has been reviewed, patient examined, no change in status, stable for surgery.  I have reviewed the patient's chart and labs.  Questions were answered to the patient's satisfaction.     [name] D

## 2018-05-18 NOTE — Discharge Instructions (Signed)
Follow with Colon Branch, MD in 2-3 weeks  Please get a complete blood count and chemistry panel checked by your Primary MD at your next visit, and again as instructed by your Primary MD. Please get your medications reviewed and adjusted by your Primary MD.  Please request your Primary MD to go over all Hospital Tests and Procedure/Radiological results at the follow up, please get all Hospital records sent to your Prim MD by signing hospital release before you go home.  If you had Pneumonia of Lung problems at the Hospital: Please get a 2 view Chest X ray done in 6-8 weeks after hospital discharge or sooner if instructed by your Primary MD.  If you have Congestive Heart Failure: Please call your Cardiologist or Primary MD anytime you have any of the following symptoms:  1) 3 pound weight gain in 24 hours or 5 pounds in 1 week  2) shortness of breath, with or without a dry hacking cough  3) swelling in the hands, feet or stomach  4) if you have to sleep on extra pillows at night in order to breathe  Follow cardiac low salt diet and 1.5 lit/day fluid restriction.  If you have diabetes Accuchecks 4 times/day, Once in AM empty stomach and then before each meal. Log in all results and show them to your primary doctor at your next visit. If any glucose reading is under 80 or above 300 call your primary MD immediately.  If you have Seizure/Convulsions/Epilepsy: Please do not drive, operate heavy machinery, participate in activities at heights or participate in high speed sports until you have seen by Primary MD or a Neurologist and advised to do so again.  If you had Gastrointestinal Bleeding: Please ask your Primary MD to check a complete blood count within one week of discharge or at your next visit. Your endoscopic/colonoscopic biopsies that are pending at the time of discharge, will also need to followed by your Primary MD.  Get Medicines reviewed and adjusted. Please take all your  medications with you for your next visit with your Primary MD  Please request your Primary MD to go over all hospital tests and procedure/radiological results at the follow up, please ask your Primary MD to get all Hospital records sent to his/her office.  If you experience worsening of your admission symptoms, develop shortness of breath, life threatening emergency, suicidal or homicidal thoughts you must seek medical attention immediately by calling 911 or calling your MD immediately  if symptoms less severe.  You must read complete instructions/literature along with all the possible adverse reactions/side effects for all the Medicines you take and that have been prescribed to you. Take any new Medicines after you have completely understood and accpet all the possible adverse reactions/side effects.   Do not drive or operate heavy machinery when taking Pain medications.   Do not take more than prescribed Pain, Sleep and Anxiety Medications  Special Instructions: If you have smoked or chewed Tobacco  in the last 2 yrs please stop smoking, stop any regular Alcohol  and or any Recreational drug use.  Wear Seat belts while driving.  Please note You were cared for by a hospitalist during your hospital stay. If you have any questions about your discharge medications or the care you received while you were in the hospital after you are discharged, you can call the unit and asked to speak with the hospitalist on call if the hospitalist that took care of you is not available.  Once you are discharged, your primary care physician will handle any further medical issues. Please note that NO REFILLS for any discharge medications will be authorized once you are discharged, as it is imperative that you return to your primary care physician (or establish a relationship with a primary care physician if you do not have one) for your aftercare needs so that they can reassess your need for medications and monitor your  lab values.  You can reach the hospitalist office at phone [phone] or fax [phone]   If you do not have a primary care physician, you can call [phone] for a physician referral.  Activity: As tolerated with Full fall precautions use walker/cane & assistance as needed  Diet: regular  Disposition Home

## 2018-05-18 NOTE — Discharge Summary (Signed)
Physician Discharge Summary  Bradley Johns:[id number] DOB: 05/04/1934 DOA: 05/16/2018  PCP: Colon Branch, MD  Admit date: 05/16/2018 Discharge date: 05/18/2018  Admitted From: home Disposition:  home  Recommendations for Outpatient Follow-up:  1. Follow up with PCP in 1-2 weeks 2. Follow-up with Dr. Collene Mares in 4 weeks  Home Health: None Equipment/Devices: Chronic home oxygen  Discharge Condition: Stable CODE STATUS: Full code Diet recommendation: Diabetic  HPI: Per admitting MD, Bradley Johns is a 84 y.o. male with medical history significant of COPD and chronic hypoxic respiratory failure on home oxygen, HTN, HLD, DM who presents to the hospital with chief complaint of rectal bleed.  Patient tells me that since yesterday he has had 2 bowel movements in a chair accompanied by large amounts of blood which was maroon/darker in appearance.  The bleeding seemed to have been much larger on the second time.  He is also been complaining of lightheadedness and dizziness.  He denies this ever happening to him in the past.  He reports taking aspirin but no other blood thinners or over-the-counter medications.  He quit smoking about 25 years ago.  He is not a drinker.  He denies any abdominal pain, has no nausea or vomiting.  Denies any chest pain or shortness of breath and otherwise he is feeling at baseline ED Course: In the emergency room he is afebrile, blood pressure has been stable, he is not tachycardic and satting well on chronic home O2.  Blood work shows a creatinine of 1.4, hemoglobin of 12 on admission.  Fecal occult was positive.  We are asked to admit for GI bleed  Hospital Course: Lower GI bleed, likely diverticular-patient was admitted to the hospital with 2 episodes of GI bleed at home.  Gastroenterology was consulted and followed patient while hospitalized.  He underwent a colonoscopy on 05/18/2018 which revealed a small polyp which was removed, along with colonic diverticulosis which  is thought to be the cause of patient's bleed.  His bleeding is stopped, his hemoglobin is remained stable and did not require transfusions, he clinically returned to baseline and will be discharged home in stable condition.  He was advised to follow-up with Dr. Collene Mares in 4 weeks. Hypertension-continue home medications COPD with chronic hypoxic respiratory failure-no wheezing, this appears stable at baseline. Chronic kidney disease stage II-creatinine at baseline Type 2 diabetes mellitus-continue home medications Hyperlipidemia-continue home medications  Discharge Diagnoses:  Active Problems:   Rectal bleeding   GI bleed   Acute GI bleeding   Discharge Instructions  Allergies as of 05/18/2018   No Known Allergies     Medication List    TAKE these medications   ACCU-CHEK AVIVA PLUS test strip Generic drug:  glucose blood Reported on 06/20/2015   ACCU-CHEK AVIVA PLUS w/Device Kit Reported on 06/20/2015   ACCU-CHEK SOFTCLIX LANCETS lancets Reported on 06/20/2015   amLODipine 10 MG tablet Commonly known as:  NORVASC Take 1 tablet (10 mg total) by mouth daily.   aspirin 81 MG tablet Take 81 mg by mouth daily.   atorvastatin 80 MG tablet Commonly known as:  LIPITOR Take 1 tablet (80 mg total) by mouth daily. What changed:  when to take this   B-D UF III MINI PEN NEEDLES 31G X 5 MM Misc Generic drug:  Insulin Pen Needle Reported on 06/20/2015   carvedilol 12.5 MG tablet Commonly known as:  COREG Take 1 tablet (12.5 mg total) by mouth 2 (two) times daily with a meal.  colchicine 0.6 MG tablet Take 1 tablet (0.6 mg total) by mouth 2 (two) times daily as needed. What changed:  reasons to take this   doxazosin 4 MG tablet Commonly known as:  CARDURA Take 1 tablet (4 mg total) by mouth at bedtime.   HYDROcodone-acetaminophen 5-325 MG tablet Commonly known as:  NORCO/VICODIN Take 1 tablet by mouth every 8 (eight) hours as needed. What changed:  reasons to take this     ketoconazole 2 % cream Commonly known as:  NIZORAL Apply 1 application topically daily. What changed:    when to take this  reasons to take this   losartan 50 MG tablet Commonly known as:  COZAAR Take 1 tablet (50 mg total) by mouth daily.   metFORMIN 500 MG tablet Commonly known as:  GLUCOPHAGE Take 500 mg by mouth 2 (two) times daily with a meal.   multivitamin tablet Take 1 tablet by mouth daily.   OXYGEN Inhale into the lungs. 3L Nocturnal and PRN for Hypoxemia   pioglitazone 45 MG tablet Commonly known as:  ACTOS Take 45 mg by mouth daily.      Follow-up Information    Juanita Craver, MD. Schedule an appointment as soon as possible for a visit in 4 week(s).   Specialty:  Gastroenterology Contact information: [address] [place] [address] [phone]        Colon Branch, MD. Schedule an appointment as soon as possible for a visit in 3 week(s).   Specialty:  Internal Medicine Contact information: [address] [place] [address] [phone]           Consultations:  Gastroenterology   Procedures/Studies:  Colonoscopy 2/11 Findings:      A 4 mm polyp was found in the rectum. The polyp was sessile. The polyp       was removed with a cold snare. Resection and retrieval were complete.      Scattered small and large-mouthed diverticula were found in the sigmoid       colon, descending colon and transverse colon.      The presumption is that the patient had a diverticular bleed. The polyp       removed was respresentative of other surrounding polyps.   No results found.  Subjective: - no chest pain, shortness of breath, no abdominal pain, nausea or vomiting.   Discharge Exam: Vitals:   05/18/18 1434 05/18/18 1444  BP: (!) 136/27 (!) (P) 140/28  Pulse: 84 78  Resp: (!) 22 15  Temp: 98.3 F (36.8 C)   SpO2: 100% 100%   General: Pt is alert, awake, not in acute distress Cardiovascular: RRR, S1/S2  +, no rubs, no gallops Respiratory: CTA bilaterally, no wheezing, no rhonchi Abdominal: Soft, NT, ND, bowel sounds + Extremities: no edema, no cyanosis  The results of significant diagnostics from this hospitalization (including imaging, microbiology, ancillary and laboratory) are listed below for reference.     Microbiology: No results found for this or any previous visit (from the past 240 hour(s)).   Labs: BNP (last 3 results) No results for input(s): BNP in the last 8760 hours. Basic Metabolic Panel: Recent Labs  Lab 05/16/18 2238 05/18/18 0702  NA 137 138  K 4.9 4.3  CL 105 104  CO2 19* 26  GLUCOSE 144* 106*  BUN 17 12  CREATININE 1.45* 1.05  CALCIUM 8.3* 8.2*   Liver Function Tests: Recent Labs  Lab 05/18/18 0702  AST 55*  ALT 17  ALKPHOS 47  BILITOT 1.4*  PROT 6.2*  ALBUMIN 3.5   No results for input(s): LIPASE, AMYLASE in the last 168 hours. No results for input(s): AMMONIA in the last 168 hours. CBC: Recent Labs  Lab 05/16/18 2238 05/17/18 0642 05/17/18 1357 05/17/18 2128 05/18/18 0702  WBC 8.2  --  6.0 8.2 5.6  NEUTROABS 5.7  --   --   --   --   HGB 12.0* 10.9* 10.2* 11.6* 10.8*  HCT 39.5 35.3* 33.1* 37.4* 35.3*  MCV 100.8*  --  102.8* 102.2* 100.6*  PLT 163  --  152 160 168   Cardiac Enzymes: No results for input(s): CKTOTAL, CKMB, CKMBINDEX, TROPONINI in the last 168 hours. BNP: Invalid input(s): POCBNP CBG: Recent Labs  Lab 05/17/18 1122 05/17/18 1636 05/18/18 0654 05/18/18 0805 05/18/18 1147  GLUCAP 85 99 100* 100* 90   D-Dimer No results for input(s): DDIMER in the last 72 hours. Hgb A1c No results for input(s): HGBA1C in the last 72 hours. Lipid Profile No results for input(s): CHOL, HDL, LDLCALC, TRIG, CHOLHDL, LDLDIRECT in the last 72 hours. Thyroid function studies No results for input(s): TSH, T4TOTAL, T3FREE, THYROIDAB in the last 72 hours.  Invalid input(s): FREET3 Anemia work up No results for input(s):  VITAMINB12, FOLATE, FERRITIN, TIBC, IRON, RETICCTPCT in the last 72 hours. Urinalysis    Component Value Date/Time   COLORURINE YELLOW 10/16/2014 [place] 10/16/2014 1203   LABSPEC 1.020 10/16/2014 1203   PHURINE 6.0 10/16/2014 1203   GLUCOSEU NEGATIVE 10/16/2014 1203   HGBUR NEGATIVE 10/16/2014 1203   HGBUR negative 10/06/2006 0756   BILIRUBINUR negative 09/09/2017 1217   KETONESUR NEGATIVE 10/16/2014 1203   PROTEINUR Positive (A) 09/09/2017 1217   UROBILINOGEN 0.2 09/09/2017 1217   UROBILINOGEN 1.0 10/16/2014 1203   NITRITE negative 09/09/2017 1217   NITRITE NEGATIVE 10/16/2014 1203   LEUKOCYTESUR Negative 09/09/2017 1217   Sepsis Labs Invalid input(s): PROCALCITONIN,  WBC,  LACTICIDVEN   Time coordinating discharge: 35 minutes  SIGNED:  Marzetta Board, MD  [facility] 05/18/2018, 3:26 PM

## 2018-05-18 NOTE — Anesthesia Preprocedure Evaluation (Addendum)
Anesthesia Evaluation  Patient identified by MRN, date of birth, ID band Patient awake    Reviewed: Allergy & Precautions, NPO status , Patient's Chart, lab work & pertinent test results  Airway Mallampati: III  TM Distance: >3 FB Neck ROM: Full    Dental no notable dental hx. (+) Missing, Poor Dentition,    Pulmonary sleep apnea , COPD (3L [place] ),  oxygen dependent, former smoker,  Pulmonary fibrosis   Pulmonary exam normal breath sounds clear to auscultation       Cardiovascular hypertension, Pt. on medications and Pt. on home beta blockers Normal cardiovascular exam Rhythm:Regular Rate:Normal     Neuro/Psych negative neurological ROS  negative psych ROS   GI/Hepatic negative GI ROS, Neg liver ROS,   Endo/Other  diabetes, Oral Hypoglycemic Agents  Renal/GU negative Renal ROS     Musculoskeletal Gout   Abdominal (+) + obese,   Peds  Hematology  (+) anemia , HLD    Anesthesia Other Findings Hematochezia  Reproductive/Obstetrics                            Anesthesia Physical Anesthesia Plan  ASA: IV  Anesthesia Plan: MAC   Post-op Pain Management:    Induction: Intravenous  PONV Risk Score and Plan: 1 and Propofol infusion and Treatment may vary due to age or medical condition  Airway Management Planned: Simple Face Mask  Additional Equipment:   Intra-op Plan:   Post-operative Plan: Extubation in OR  Informed Consent: I have reviewed the patients History and Physical, chart, labs and discussed the procedure including the risks, benefits and alternatives for the proposed anesthesia with the patient or authorized representative who has indicated his/her understanding and acceptance.     Dental advisory given  Plan Discussed with: CRNA  Anesthesia Plan Comments:        Anesthesia Quick Evaluation

## 2018-05-18 NOTE — Transfer of Care (Signed)
Immediate Anesthesia Transfer of Care Note  Patient: Bradley Johns  Procedure(s) Performed: COLONOSCOPY WITH PROPOFOL (N/A ) POLYPECTOMY  Patient Location: Endoscopy Unit  Anesthesia Type:MAC  Level of Consciousness: awake, alert , oriented and patient cooperative  Airway & Oxygen Therapy: Patient Spontanous Breathing and Patient connected to nasal cannula oxygen  Post-op Assessment: Report given to RN, Post -op Vital signs reviewed and stable and Patient moving all extremities  Post vital signs: Reviewed and stable  Last Vitals:  Vitals Value Taken Time  BP 157/58 05/18/2018  2:51 PM  Temp    Pulse 65 05/18/2018  2:53 PM  Resp 16 05/18/2018  2:53 PM  SpO2 97 % 05/18/2018  2:53 PM  Vitals shown include unvalidated device data.  Last Pain:  Vitals:   05/18/18 1444  TempSrc:   PainSc: 0-No pain         Complications: No apparent anesthesia complications

## 2018-05-18 NOTE — Progress Notes (Signed)
Patient has discharged to home on 05/18/2018. Discharge instruction including medication and appointment was given to patient. Patient has no question at this time.

## 2018-05-18 NOTE — Op Note (Addendum)
[facility] Patient Name: Bradley Johns Procedure Date: 05/18/2018 MRN: [id number] Attending MD: Carol Ada , MD Date of Birth: [birth year] CSN: [id number] Age: 84 Admit Type: Outpatient Procedure:                Colonoscopy Indications:              Hematochezia Providers:                Carol Ada, MD, Cleda Daub, RN, Charolette Child,                            Technician, Caryl Pina CRNA Referring MD:              Medicines:                Propofol per Anesthesia Complications:            No immediate complications. Estimated Blood Loss:     Estimated blood loss was minimal. Procedure:                Pre-Anesthesia Assessment:                           - Prior to the procedure, a History and Physical                            was performed, and patient medications and                            allergies were reviewed. The patient's tolerance of                            previous anesthesia was also reviewed. The risks                            and benefits of the procedure and the sedation                            options and risks were discussed with the patient.                            All questions were answered, and informed consent                            was obtained. Prior Anticoagulants: The patient has                            taken no previous anticoagulant or antiplatelet                            agents. ASA Grade Assessment: III - A patient with                            severe systemic disease. After reviewing the risks  and benefits, the patient was deemed in                            satisfactory condition to undergo the procedure.                           - Sedation was administered by an anesthesia                            professional. Deep sedation was attained.                           After obtaining informed consent, the colonoscope                            was passed under direct  vision. Throughout the                            procedure, the patient's blood pressure, pulse, and                            oxygen saturations were monitored continuously. The                            [id number]-HQ190L ([id number]) Olympus colonoscope was                            introduced through the anus and advanced to the the                            cecum, identified by appendiceal orifice and                            ileocecal valve. The colonoscopy was performed                            without difficulty. The patient tolerated the                            procedure well. The quality of the bowel                            preparation was good. The ileocecal valve,                            appendiceal orifice, and rectum were photographed. Scope In: 1:53:29 PM Scope Out: 2:26:10 PM Scope Withdrawal Time: 0 hours 21 minutes 49 seconds  Total Procedure Duration: 0 hours 32 minutes 41 seconds  Findings:      A 4 mm polyp was found in the rectum. The polyp was sessile. The polyp       was removed with a cold snare. Resection and retrieval were complete.      Scattered small and large-mouthed diverticula were found in the sigmoid       colon, descending colon and transverse colon.  The presumption is that the patient had a diverticular bleed. The polyp       removed was respresentative of other surrounding polyps. Impression:               - One 4 mm polyp in the rectum, removed with a cold                            snare. Resected and retrieved.                           - Diverticulosis in the sigmoid colon, in the                            descending colon and in the transverse colon. Moderate Sedation:      Not Applicable - Patient had care per Anesthesia. Recommendation:           - Return patient to hospital ward for ongoing care.                           - Resume regular diet.                           - Continue present medications.                           -  Await pathology results.                           - Repeat colonoscopy is not recommended due to                            current age (82 years or older) for surveillance                            and severe pulmonary comorbidities.                           - Follow up with Dr. Collene Mares in one month. Procedure Code(s):        --- Professional ---                           [phone], Colonoscopy, flexible; with removal of                            tumor(s), polyp(s), or other lesion(s) by snare                            technique Diagnosis Code(s):        --- Professional ---                           K62.1, Rectal polyp                           K92.1, Melena (includes Hematochezia)  K57.30, Diverticulosis of large intestine without                            perforation or abscess without bleeding CPT copyright 2018 [employer]. All rights reserved. The codes documented in this report are preliminary and upon coder review may  be revised to meet current compliance requirements. Carol Ada, MD Carol Ada, MD 05/18/2018 2:38:08 PM This report has been signed electronically. Number of Addenda: 0

## 2018-05-19 ENCOUNTER — Telehealth: Payer: Self-pay | Admitting: *Deleted

## 2018-05-19 ENCOUNTER — Encounter (HOSPITAL_COMMUNITY): Payer: Self-pay | Admitting: Gastroenterology

## 2018-05-19 NOTE — Telephone Encounter (Signed)
Transition Care Management Follow-up Telephone Call   Date discharged? 2.11.2020   How have you been since you were released from the hospital? "doing okay"   Do you understand why you were in the hospital? yes   Do you understand the discharge instructions? yes   Where were you discharged to? Home with wife   Items Reviewed:  Medications reviewed: pt reports no changes and states he will bring his list to PCP appt tomorrow  Allergies reviewed: pt reports no changes and states he will bring his list to PCP appt tomorrow  Dietary changes reviewed: yes  Referrals reviewed: yes   Functional Questionnaire:   Activities of Daily Living (ADLs):   He states they are independent in the following: ambulation, bathing and hygiene, feeding, continence, grooming, toileting and dressing States they require assistance with the following: na   Any transportation issues/concerns?: wife will bring him. He no longer drives   Any patient concerns? He reports he sometimes gets a little dizzy or clumsy when standing up   Confirmed importance and date/time of follow-up visits scheduled yes  Provider Appointment booked with PCP 05/20/2018 @1   Confirmed with patient if condition begins to worsen call PCP or go to the ER.  Patient was given the office number and encouraged to call back with question or concerns.  : yes

## 2018-05-20 ENCOUNTER — Other Ambulatory Visit: Payer: Self-pay

## 2018-05-20 ENCOUNTER — Inpatient Hospital Stay (HOSPITAL_COMMUNITY): Payer: Medicare PPO

## 2018-05-20 ENCOUNTER — Encounter (HOSPITAL_COMMUNITY): Payer: Self-pay | Admitting: Emergency Medicine

## 2018-05-20 ENCOUNTER — Inpatient Hospital Stay: Payer: Medicare PPO | Admitting: Internal Medicine

## 2018-05-20 ENCOUNTER — Inpatient Hospital Stay (HOSPITAL_COMMUNITY)
Admission: EM | Admit: 2018-05-20 | Discharge: 2018-05-25 | DRG: 377 | Disposition: A | Discharge status: Home Health | Payer: Medicare PPO | Attending: Internal Medicine | Admitting: Internal Medicine

## 2018-05-20 DIAGNOSIS — K5730 Diverticulosis of large intestine without perforation or abscess without bleeding: Secondary | ICD-10-CM | POA: Diagnosis not present

## 2018-05-20 DIAGNOSIS — E785 Hyperlipidemia, unspecified: Secondary | ICD-10-CM | POA: Diagnosis present

## 2018-05-20 DIAGNOSIS — E1165 Type 2 diabetes mellitus with hyperglycemia: Secondary | ICD-10-CM | POA: Diagnosis present

## 2018-05-20 DIAGNOSIS — K5900 Constipation, unspecified: Secondary | ICD-10-CM | POA: Diagnosis present

## 2018-05-20 DIAGNOSIS — R0902 Hypoxemia: Secondary | ICD-10-CM

## 2018-05-20 DIAGNOSIS — Z833 Family history of diabetes mellitus: Secondary | ICD-10-CM | POA: Diagnosis not present

## 2018-05-20 DIAGNOSIS — J441 Chronic obstructive pulmonary disease with (acute) exacerbation: Secondary | ICD-10-CM | POA: Diagnosis not present

## 2018-05-20 DIAGNOSIS — Z79899 Other long term (current) drug therapy: Secondary | ICD-10-CM

## 2018-05-20 DIAGNOSIS — M109 Gout, unspecified: Secondary | ICD-10-CM | POA: Diagnosis present

## 2018-05-20 DIAGNOSIS — G4733 Obstructive sleep apnea (adult) (pediatric): Secondary | ICD-10-CM | POA: Diagnosis present

## 2018-05-20 DIAGNOSIS — K922 Gastrointestinal hemorrhage, unspecified: Secondary | ICD-10-CM | POA: Diagnosis not present

## 2018-05-20 DIAGNOSIS — J439 Emphysema, unspecified: Secondary | ICD-10-CM | POA: Diagnosis present

## 2018-05-20 DIAGNOSIS — J811 Chronic pulmonary edema: Secondary | ICD-10-CM | POA: Diagnosis present

## 2018-05-20 DIAGNOSIS — J181 Lobar pneumonia, unspecified organism: Secondary | ICD-10-CM | POA: Diagnosis not present

## 2018-05-20 DIAGNOSIS — E1159 Type 2 diabetes mellitus with other circulatory complications: Secondary | ICD-10-CM | POA: Diagnosis present

## 2018-05-20 DIAGNOSIS — J9621 Acute and chronic respiratory failure with hypoxia: Secondary | ICD-10-CM | POA: Diagnosis present

## 2018-05-20 DIAGNOSIS — Z7984 Long term (current) use of oral hypoglycemic drugs: Secondary | ICD-10-CM | POA: Diagnosis not present

## 2018-05-20 DIAGNOSIS — Z7982 Long term (current) use of aspirin: Secondary | ICD-10-CM | POA: Diagnosis not present

## 2018-05-20 DIAGNOSIS — I10 Essential (primary) hypertension: Secondary | ICD-10-CM | POA: Diagnosis present

## 2018-05-20 DIAGNOSIS — Z87891 Personal history of nicotine dependence: Secondary | ICD-10-CM | POA: Diagnosis not present

## 2018-05-20 DIAGNOSIS — R42 Dizziness and giddiness: Secondary | ICD-10-CM | POA: Diagnosis not present

## 2018-05-20 DIAGNOSIS — Z8249 Family history of ischemic heart disease and other diseases of the circulatory system: Secondary | ICD-10-CM | POA: Diagnosis not present

## 2018-05-20 DIAGNOSIS — N179 Acute kidney failure, unspecified: Secondary | ICD-10-CM | POA: Diagnosis present

## 2018-05-20 DIAGNOSIS — M1990 Unspecified osteoarthritis, unspecified site: Secondary | ICD-10-CM | POA: Diagnosis present

## 2018-05-20 DIAGNOSIS — K5731 Diverticulosis of large intestine without perforation or abscess with bleeding: Secondary | ICD-10-CM | POA: Diagnosis present

## 2018-05-20 DIAGNOSIS — J8410 Pulmonary fibrosis, unspecified: Secondary | ICD-10-CM | POA: Diagnosis present

## 2018-05-20 DIAGNOSIS — D62 Acute posthemorrhagic anemia: Secondary | ICD-10-CM | POA: Diagnosis present

## 2018-05-20 DIAGNOSIS — G4730 Sleep apnea, unspecified: Secondary | ICD-10-CM | POA: Diagnosis present

## 2018-05-20 DIAGNOSIS — N400 Enlarged prostate without lower urinary tract symptoms: Secondary | ICD-10-CM | POA: Diagnosis present

## 2018-05-20 DIAGNOSIS — Z9981 Dependence on supplemental oxygen: Secondary | ICD-10-CM | POA: Diagnosis not present

## 2018-05-20 DIAGNOSIS — D500 Iron deficiency anemia secondary to blood loss (chronic): Secondary | ICD-10-CM | POA: Diagnosis not present

## 2018-05-20 DIAGNOSIS — K625 Hemorrhage of anus and rectum: Secondary | ICD-10-CM | POA: Diagnosis present

## 2018-05-20 LAB — CBC
HCT: 30.8 % — ABNORMAL LOW (ref 39.0–52.0)
HCT: 25.9 % — ABNORMAL LOW (ref 39.0–52.0)
Hemoglobin: 9.5 g/dL — ABNORMAL LOW (ref 13.0–17.0)
Hemoglobin: 7.9 g/dL — ABNORMAL LOW (ref 13.0–17.0)
MCH: 31 pg (ref 26.0–34.0)
MCH: 31.5 pg (ref 26.0–34.0)
MCHC: 30.8 g/dL (ref 30.0–36.0)
MCHC: 30.5 g/dL (ref 30.0–36.0)
MCV: 100.7 fL — ABNORMAL HIGH (ref 80.0–100.0)
MCV: 103.2 fL — ABNORMAL HIGH (ref 80.0–100.0)
Platelets: 177 10*3/uL (ref 150–400)
Platelets: 127 10*3/uL — ABNORMAL LOW (ref 150–400)
RBC: 3.06 MIL/uL — ABNORMAL LOW (ref 4.22–5.81)
RBC: 2.51 MIL/uL — ABNORMAL LOW (ref 4.22–5.81)
RDW: 14.4 % (ref 11.5–15.5)
RDW: 14.3 % (ref 11.5–15.5)
WBC: 8.4 10*3/uL (ref 4.0–10.5)
WBC: 5.7 10*3/uL (ref 4.0–10.5)
nRBC: 0 % (ref 0.0–0.2)
nRBC: 0 % (ref 0.0–0.2)

## 2018-05-20 LAB — COMPREHENSIVE METABOLIC PANEL
ALT: 27 U/L (ref 0–44)
AST: 72 U/L — ABNORMAL HIGH (ref 15–41)
Albumin: 3.5 g/dL (ref 3.5–5.0)
Alkaline Phosphatase: 45 U/L (ref 38–126)
Anion gap: 6 (ref 5–15)
BUN: 20 mg/dL (ref 8–23)
CO2: 27 mmol/L (ref 22–32)
Calcium: 8.3 mg/dL — ABNORMAL LOW (ref 8.9–10.3)
Chloride: 107 mmol/L (ref 98–111)
Creatinine, Ser: 1.76 mg/dL — ABNORMAL HIGH (ref 0.61–1.24)
GFR calc Af Amer: 41 mL/min — ABNORMAL LOW (ref >60)
GFR calc non Af Amer: 35 mL/min — ABNORMAL LOW (ref >60)
Glucose, Bld: 141 mg/dL — ABNORMAL HIGH (ref 70–99)
Potassium: 4.3 mmol/L (ref 3.5–5.1)
Sodium: 140 mmol/L (ref 135–145)
Total Bilirubin: 0.8 mg/dL (ref 0.3–1.2)
Total Protein: 6.3 g/dL — ABNORMAL LOW (ref 6.5–8.1)

## 2018-05-20 LAB — HEMOGLOBIN AND HEMATOCRIT, BLOOD
HCT: 24.4 % — ABNORMAL LOW (ref 39.0–52.0)
Hemoglobin: 7.4 g/dL — ABNORMAL LOW (ref 13.0–17.0)

## 2018-05-20 LAB — BASIC METABOLIC PANEL
Anion gap: 3 — ABNORMAL LOW (ref 5–15)
BUN: 20 mg/dL (ref 8–23)
CO2: 27 mmol/L (ref 22–32)
Calcium: 7.7 mg/dL — ABNORMAL LOW (ref 8.9–10.3)
Chloride: 111 mmol/L (ref 98–111)
Creatinine, Ser: 1.42 mg/dL — ABNORMAL HIGH (ref 0.61–1.24)
GFR calc Af Amer: 53 mL/min — ABNORMAL LOW (ref >60)
GFR calc non Af Amer: 46 mL/min — ABNORMAL LOW (ref >60)
Glucose, Bld: 124 mg/dL — ABNORMAL HIGH (ref 70–99)
Potassium: 4 mmol/L (ref 3.5–5.1)
Sodium: 141 mmol/L (ref 135–145)

## 2018-05-20 LAB — POC OCCULT BLOOD, ED: Fecal Occult Bld: POSITIVE — AB

## 2018-05-20 LAB — GLUCOSE, CAPILLARY
Glucose-Capillary: 111 mg/dL — ABNORMAL HIGH (ref 70–99)
Glucose-Capillary: 93 mg/dL (ref 70–99)

## 2018-05-20 LAB — MRSA PCR SCREENING: MRSA by PCR: NEGATIVE

## 2018-05-20 LAB — ABO/RH: ABO/RH(D): O POS

## 2018-05-20 MED ORDER — AMLODIPINE BESYLATE 10 MG PO TABS
10.0000 mg | ORAL_TABLET | Freq: Every day | ORAL | Status: DC
  Administered 2018-05-20 – 2018-05-22 (×3): 10 mg via ORAL
  Filled 2018-05-20 (×3): qty 1

## 2018-05-20 MED ORDER — CARVEDILOL 12.5 MG PO TABS
12.5000 mg | ORAL_TABLET | Freq: Two times a day (BID) | ORAL | Status: DC
  Administered 2018-05-20 – 2018-05-21 (×2): 12.5 mg via ORAL
  Filled 2018-05-20 (×2): qty 1

## 2018-05-20 MED ORDER — TECHNETIUM TC 99M-LABELED RED BLOOD CELLS IV KIT
23.9000 | PACK | Freq: Once | INTRAVENOUS | Status: AC | PRN
  Administered 2018-05-20: 23.9 via INTRAVENOUS

## 2018-05-20 MED ORDER — SODIUM CHLORIDE 0.9 % IV SOLN
INTRAVENOUS | Status: DC
  Administered 2018-05-21: 05:00:00 via INTRAVENOUS

## 2018-05-20 MED ORDER — KETOCONAZOLE 2 % EX CREA: 1.0000 "application " | TOPICAL_CREAM | Freq: Every day | CUTANEOUS | Status: DC | PRN

## 2018-05-20 MED ORDER — DOXAZOSIN MESYLATE 1 MG PO TABS
4.0000 mg | ORAL_TABLET | Freq: Every day | ORAL | Status: DC
  Administered 2018-05-20: 4 mg via ORAL
  Filled 2018-05-20: qty 4

## 2018-05-20 MED ORDER — SODIUM CHLORIDE 0.9 % IV BOLUS
1000.0000 mL | Freq: Once | INTRAVENOUS | Status: AC
  Administered 2018-05-20: 1000 mL via INTRAVENOUS

## 2018-05-20 MED ORDER — ORAL CARE MOUTH RINSE
15.0000 mL | Freq: Two times a day (BID) | OROMUCOSAL | Status: DC
  Administered 2018-05-20 – 2018-05-24 (×7): 15 mL via OROMUCOSAL

## 2018-05-20 MED ORDER — LOSARTAN POTASSIUM 50 MG PO TABS
50.0000 mg | ORAL_TABLET | Freq: Every day | ORAL | Status: DC
  Administered 2018-05-20 – 2018-05-22 (×3): 50 mg via ORAL
  Filled 2018-05-20 (×3): qty 1

## 2018-05-20 NOTE — ED Provider Notes (Signed)
[place] DEPT Provider Note: Bradley Spurling, MD, FACEP  CSN: [id number] MRN: [id number] ARRIVAL: 05/20/18 at [place]  Rectal Bleeding   HISTORY OF PRESENT ILLNESS  05/20/18 2:35 AM Bradley Johns is a 84 y.o. male who was admitted on February 9 for lower GI bleeding.  He underwent a colonoscopy on the 11th and was diagnosed with a likely diverticular bleed.  Polyp was also removed during the colonoscopy.  He is here with a return of rectal bleeding that began yesterday morning.  It was blood-tinged at first but became more brisk with passage of clots.  He is unable to quantify exactly how much she has passed.  He has had some lightheadedness with this but denies change in baseline shortness of breath.  He denies chest pain or abdominal pain.  His hemoglobin 2 days ago was 11; it is noted to be 9.5 this morning.     Past Medical History:  Diagnosis Date  . Arthritis   . Benign prostatic hypertrophy   . COPD (chronic obstructive pulmonary disease) ([place])    24/7 O2  . Diabetes mellitus    per Dr Bradley Johns  . Epidermal cyst of face    left lower lateral cheek  . Gout   . Hyperlipidemia   . Hypertension   . Pulmonary fibrosis ([place]) 2014  . Sleep apnea     Past Surgical History:  Procedure Laterality Date  . COLONOSCOPY WITH PROPOFOL N/A 05/18/2018   Procedure: COLONOSCOPY WITH PROPOFOL;  Surgeon: Bradley Ada, MD;  Location: [facility];  Service: Endoscopy;  Laterality: N/A;  . POLYPECTOMY  05/18/2018   Procedure: POLYPECTOMY;  Surgeon: Bradley Ada, MD;  Location: [facility];  Service: Endoscopy;;  . TOE SURGERY      Family History  Problem Relation Age of Onset  . Liver disease Mother   . Coronary artery disease Father 55  . Diabetes Sister   . Cancer Brother        type?  Marland Kitchen Hypertension Neg Hx   . Hyperlipidemia Neg Hx   . Heart attack Neg Hx   . Prostate cancer Neg Hx   . Bradley cancer Neg Hx     Social History   Tobacco  Use  . Smoking status: Former Smoker    Packs/day: 1.00    Years: 30.00    Pack years: 30.00    Types: Cigarettes    Last attempt to quit: 04/07/1996    Years since quitting: 22.1  . Smokeless tobacco: Never Used  Substance Use Topics  . Alcohol use: Yes    Comment: occassional beer  . Drug use: No    Prior to Admission medications   Medication Sig Start Date End Date Taking? Authorizing Provider  amLODipine (NORVASC) 10 MG tablet Take 1 tablet (10 mg total) by mouth daily. 04/15/17  Yes Bradley Branch, MD  aspirin 81 MG tablet Take 81 mg by mouth daily.     Yes [provider]  atorvastatin (LIPITOR) 80 MG tablet Take 1 tablet (80 mg total) by mouth daily. Patient taking differently: Take 80 mg by mouth every evening.  04/15/17  Yes Bradley Branch, MD  carvedilol (COREG) 12.5 MG tablet Take 1 tablet (12.5 mg total) by mouth 2 (two) times daily with a meal. 06/12/17  Yes Paz, Alda Berthold, MD  colchicine 0.6 MG tablet Take 1 tablet (0.6 mg total) by mouth 2 (two) times daily as needed. Patient taking differently: Take 0.6  mg by mouth 2 (two) times daily as needed (gout flair).  12/18/17  Yes Paz, Alda Berthold, MD  doxazosin (CARDURA) 4 MG tablet Take 1 tablet (4 mg total) by mouth at bedtime. 04/27/18  Yes Paz, Alda Berthold, MD  HYDROcodone-acetaminophen (NORCO/VICODIN) 5-325 MG tablet Take 1 tablet by mouth every 8 (eight) hours as needed. Patient taking differently: Take 1 tablet by mouth every 8 (eight) hours as needed for moderate pain.  12/09/17  Yes Paz, Alda Berthold, MD  ketoconazole (NIZORAL) 2 % cream Apply 1 application topically daily. Patient taking differently: Apply 1 application topically daily as needed (moisture around groin).  03/24/18  Yes Paz, Alda Berthold, MD  losartan (COZAAR) 50 MG tablet Take 1 tablet (50 mg total) by mouth daily. 06/12/17  Yes Paz, Alda Berthold, MD  metFORMIN (GLUCOPHAGE) 500 MG tablet Take 500 mg by mouth 2 (two) times daily with a meal.     Yes [provider]  Multiple Vitamin  (MULTIVITAMIN) tablet Take 1 tablet by mouth daily.     Yes [provider]  pioglitazone (ACTOS) 45 MG tablet Take 45 mg by mouth daily.   Yes [provider]  ACCU-CHEK AVIVA PLUS test strip Reported on 06/20/2015 12/13/14   [provider]  ACCU-CHEK SOFTCLIX LANCETS lancets Reported on 06/20/2015 12/13/14   [provider]  B-D UF III MINI PEN NEEDLES 31G X 5 MM MISC Reported on 06/20/2015 11/07/14   [provider]  Blood Glucose Monitoring Suppl (ACCU-CHEK AVIVA PLUS) W/DEVICE KIT Reported on 06/20/2015 10/27/14   [provider]  OXYGEN Inhale into the lungs. 3L Nocturnal and PRN for Hypoxemia    [provider]    Allergies Patient has no known allergies.   REVIEW OF SYSTEMS  Negative except as noted here or in the History of Present Illness.   PHYSICAL EXAMINATION  Initial Vital Signs Blood pressure (!) 91/34, pulse 72, temperature 97.9 F (36.6 C), temperature source Oral, resp. rate 18, height _0  (1.727 m), weight 92.5 kg, SpO2 92 %.  Examination General: Well-developed, well-nourished male in no acute distress; appearance consistent with age of record HENT: normocephalic; atraumatic Eyes: pupils equal, round and reactive to light; extraocular muscles intact; arcus senilis bilaterally Neck: supple Heart: regular rate and rhythm Lungs: clear to auscultation bilaterally Abdomen: soft; nondistended; nontender; bowel sounds present Rectal: Normal sphincter tone; gross blood in vault Extremities: No deformity; full range of motion; pulses normal Neurologic: Awake, alert and oriented; motor function intact in all extremities and symmetric; no facial droop Skin: Warm and dry Psychiatric: Normal mood and affect   RESULTS  Summary of this visit's results, reviewed by myself:   EKG Interpretation  Date/Time:    Ventricular Rate:    PR Interval:    QRS Duration:   QT Interval:    QTC Calculation:   R Axis:       Text Interpretation:        Laboratory Studies: Results for orders placed or performed during the hospital encounter of 05/20/18 (from the past 24 hour(s))  ABO/Rh     Status: None (Preliminary result)   Collection Time: 05/20/18  1:01 AM  Result Value Ref [place]   ABO/RH(D)      O POS Performed at [facility], [place] [address], [place], [place] [address]   Comprehensive metabolic panel     Status: Abnormal   Collection Time: 05/20/18  1:12 AM  Result Value Ref [place]   Sodium 140  135 - 145 mmol/L   Potassium 4.3 3.5 - 5.1 mmol/L   Chloride 107 98 - 111 mmol/L   CO2 27 22 - 32 mmol/L   Glucose, Bld 141 (H) 70 - 99 mg/dL   BUN 20 8 - 23 mg/dL   Creatinine, Ser 1.76 (H) 0.61 - 1.24 mg/dL   Calcium 8.3 (L) 8.9 - 10.3 mg/dL   Total Protein 6.3 (L) 6.5 - 8.1 g/dL   Albumin 3.5 3.5 - 5.0 g/dL   AST 72 (H) 15 - 41 U/L   ALT 27 0 - 44 U/L   Alkaline Phosphatase 45 38 - 126 U/L   Total Bilirubin 0.8 0.3 - 1.2 mg/dL   GFR calc non Af Amer 35 (L) >60 mL/min   GFR calc Af Amer 41 (L) >60 mL/min   Anion gap 6 5 - 15  CBC     Status: Abnormal   Collection Time: 05/20/18  1:12 AM  Result Value Ref [place]   WBC 8.4 4.0 - 10.5 K/uL   RBC 3.06 (L) 4.22 - 5.81 MIL/uL   Hemoglobin 9.5 (L) 13.0 - 17.0 g/dL   HCT 30.8 (L) 39.0 - 52.0 %   MCV 100.7 (H) 80.0 - 100.0 fL   MCH 31.0 26.0 - 34.0 pg   MCHC 30.8 30.0 - 36.0 g/dL   RDW 14.4 11.5 - 15.5 %   Platelets 177 150 - 400 K/uL   nRBC 0.0 0.0 - 0.2 %  Type and screen [place]     Status: None   Collection Time: 05/20/18  1:13 AM  Result Value Ref [place]   ABO/RH(D) O POS    Antibody Screen NEG    Sample Expiration      05/23/2018 Performed at [facility], [place] [address], [place], [place] [address]   POC occult blood, ED     Status: Abnormal   Collection Time: 05/20/18  2:43 AM  Result Value Ref [place]   Fecal Occult Bld POSITIVE (A) NEGATIVE   Imaging Studies: No results  found.  ED COURSE and MDM  Nursing notes and initial vitals signs, including pulse oximetry, reviewed.  Vitals:   05/20/18 0025  BP: (!) 91/34  Pulse: 72  Resp: 18  Temp: 97.9 F (36.6 C)  TempSrc: Oral  SpO2: 92%  Weight: 92.5 kg  Height: _0  (1.727 m)   2:57 AM IV fluids started for soft blood pressure.  Dr. Shanon Brow consulted for admission to hospitalist service.  She requests a nuclear medicine tagged red blood cell scan.  PROCEDURES    ED DIAGNOSES     ICD-10-CM   1. Lower GI bleed K92.2   2. Anemia due to acute blood loss D62        Rudy Domek, Jenny Reichmann, MD 05/20/18 [phone]

## 2018-05-20 NOTE — H&P (Signed)
History and Physical    MARDELL CRAGG KGU:[id number] DOB: 1935/04/06 DOA: 05/20/2018  PCP: Colon Branch, MD  Patient coming from: Home  Chief Complaint: Rectal bleeding  HPI: Bradley Johns is a 84 y.o. male with medical history significant of pulmonary fibrosis and COPD on 3 to 4 L of oxygen chronically at home per nasal cannula, hypertension just discharged on February 11 for lower GI bleed had a colonoscopy done by Dr. Benson Norway showing diverticulosis without any evidence of active bleeding had a polyp that was removed.  Patient reports his bleeding seemed to have stopped prior to his discharge she was sent home and his aspirin was resumed.  He was at home 24 hours when he started having bright red blood per rectum again that was darker in nature but not melanotic.  He denies any nausea vomiting or any abdominal pain.  He denies any fevers.  His hemoglobin has dropped from 12 on that admission down to 9 now.  He is starting to get dizzy and weak.  Patient is being referred for admission for lower GI bleed.  Review of Systems: As per HPI otherwise 10 point review of systems negative.   Past Medical History:  Diagnosis Date  . Arthritis   . Benign prostatic hypertrophy   . COPD (chronic obstructive pulmonary disease) ([place])    24/7 O2  . Diabetes mellitus    per Dr Posey Pronto  . Epidermal cyst of face    left lower lateral cheek  . Gout   . Hyperlipidemia   . Hypertension   . Pulmonary fibrosis ([place]) 2014  . Sleep apnea     Past Surgical History:  Procedure Laterality Date  . COLONOSCOPY WITH PROPOFOL N/A 05/18/2018   Procedure: COLONOSCOPY WITH PROPOFOL;  Surgeon: Carol Ada, MD;  [facility];  Service: Endoscopy;  Laterality: N/A;  . POLYPECTOMY  05/18/2018   Procedure: POLYPECTOMY;  Surgeon: Carol Ada, MD;  [facility];  Service: Endoscopy;;  . TOE SURGERY       reports that he quit smoking about 22 years ago. His smoking use included cigarettes. He has a  30.00 pack-year smoking history. He has never used smokeless tobacco. He reports current alcohol use. He reports that he does not use drugs.  No Known Allergies  Family History  Problem Relation Age of Onset  . Liver disease Mother   . Coronary artery disease Father 64  . Diabetes Sister   . Cancer Brother        type?  Marland Kitchen Hypertension Neg Hx   . Hyperlipidemia Neg Hx   . Heart attack Neg Hx   . Prostate cancer Neg Hx   . Colon cancer Neg Hx     Prior to Admission medications   Medication Sig Start Date End Date Taking? Authorizing Provider  amLODipine (NORVASC) 10 MG tablet Take 1 tablet (10 mg total) by mouth daily. 04/15/17  Yes Colon Branch, MD  aspirin 81 MG tablet Take 81 mg by mouth daily.     Yes [provider]  atorvastatin (LIPITOR) 80 MG tablet Take 1 tablet (80 mg total) by mouth daily. Patient taking differently: Take 80 mg by mouth every evening.  04/15/17  Yes Colon Branch, MD  carvedilol (COREG) 12.5 MG tablet Take 1 tablet (12.5 mg total) by mouth 2 (two) times daily with a meal. 06/12/17  Yes Paz, Alda Berthold, MD  colchicine 0.6 MG tablet Take 1 tablet (0.6 mg total) by mouth 2 (  two) times daily as needed. Patient taking differently: Take 0.6 mg by mouth 2 (two) times daily as needed (gout flair).  12/18/17  Yes Paz, Alda Berthold, MD  doxazosin (CARDURA) 4 MG tablet Take 1 tablet (4 mg total) by mouth at bedtime. 04/27/18  Yes Paz, Alda Berthold, MD  HYDROcodone-acetaminophen (NORCO/VICODIN) 5-325 MG tablet Take 1 tablet by mouth every 8 (eight) hours as needed. Patient taking differently: Take 1 tablet by mouth every 8 (eight) hours as needed for moderate pain.  12/09/17  Yes Paz, Alda Berthold, MD  ketoconazole (NIZORAL) 2 % cream Apply 1 application topically daily. Patient taking differently: Apply 1 application topically daily as needed (moisture around groin).  03/24/18  Yes Paz, Alda Berthold, MD  losartan (COZAAR) 50 MG tablet Take 1 tablet (50 mg total) by mouth daily. 06/12/17  Yes Paz, Alda Berthold, MD  metFORMIN (GLUCOPHAGE) 500 MG tablet Take 500 mg by mouth 2 (two) times daily with a meal.     Yes [provider]  Multiple Vitamin (MULTIVITAMIN) tablet Take 1 tablet by mouth daily.     Yes [provider]  pioglitazone (ACTOS) 45 MG tablet Take 45 mg by mouth daily.   Yes [provider]  ACCU-CHEK AVIVA PLUS test strip Reported on 06/20/2015 12/13/14   [provider]  ACCU-CHEK SOFTCLIX LANCETS lancets Reported on 06/20/2015 12/13/14   [provider]  B-D UF III MINI PEN NEEDLES 31G X 5 MM MISC Reported on 06/20/2015 11/07/14   [provider]  Blood Glucose Monitoring Suppl (ACCU-CHEK AVIVA PLUS) W/DEVICE KIT Reported on 06/20/2015 10/27/14   [provider]  OXYGEN Inhale into the lungs. 3L Nocturnal and PRN for Hypoxemia    [provider]    Physical Exam: Vitals:   05/20/18 0025 05/20/18 0200 05/20/18 0257 05/20/18 0300  BP: (!) 91/34  (!) 122/49 (!) 122/49  Pulse: 72 64 66 (!) 59  Resp: 18  18   Temp: 97.9 F (36.6 C)  97.9 F (36.6 C)   TempSrc: Oral  Oral   SpO2: 92% 96% 99% 99%  Weight: 92.5 kg     Height: '5\' 8"'$  (1.727 m)         Constitutional: NAD, calm, comfortable Vitals:   05/20/18 0025 05/20/18 0200 05/20/18 0257 05/20/18 0300  BP: (!) 91/34  (!) 122/49 (!) 122/49  Pulse: 72 64 66 (!) 59  Resp: 18  18   Temp: 97.9 F (36.6 C)  97.9 F (36.6 C)   TempSrc: Oral  Oral   SpO2: 92% 96% 99% 99%  Weight: 92.5 kg     Height: '5\' 8"'$  (1.727 m)      Eyes: PERRL, lids and conjunctivae normal ENMT: Mucous membranes are moist. Posterior pharynx clear of any exudate or lesions.Normal dentition.  Neck: normal, supple, no masses, no thyromegaly Respiratory: clear to auscultation bilaterally, no wheezing, no crackles. Normal respiratory effort. No accessory muscle use.  Cardiovascular: Regular rate and rhythm, no murmurs / rubs / gallops. No extremity edema. 2+ pedal pulses. No carotid bruits.    Abdomen: no tenderness, no masses palpated. No hepatosplenomegaly. Bowel sounds positive.  Musculoskeletal: no clubbing / cyanosis. No joint deformity upper and lower extremities. Good ROM, no contractures. Normal muscle tone.  Skin: no rashes, lesions, ulcers. No induration Neurologic: CN 2-12 grossly intact. Sensation intact, DTR normal. Strength 5/5 in all 4.  Psychiatric: Normal judgment and insight. Alert and oriented x 3. Normal mood.    Labs on  Admission: I have personally reviewed following labs and imaging studies  CBC: Recent Labs  Lab 05/16/18 2238  05/17/18 1357 05/17/18 2128 05/18/18 0702 05/18/18 1513 05/20/18 0112  WBC 8.2  --  6.0 8.2 5.6 5.7 8.4  NEUTROABS 5.7  --   --   --   --   --   --   HGB 12.0*   < > 10.2* 11.6* 10.8* 11.0* 9.5*  HCT 39.5   < > 33.1* 37.4* 35.3* 36.5* 30.8*  MCV 100.8*  --  102.8* 102.2* 100.6* 100.6* 100.7*  PLT 163  --  152 160 168 177 177   < > = values in this interval not displayed.   Basic Metabolic Panel: Recent Labs  Lab 05/16/18 2238 05/18/18 0702 05/20/18 0112  NA 137 138 140  K 4.9 4.3 4.3  CL 105 104 107  CO2 19* 26 27  GLUCOSE 144* 106* 141*  BUN '17 12 20  '$ CREATININE 1.45* 1.05 1.76*  CALCIUM 8.3* 8.2* 8.3*   GFR: Estimated Creatinine Clearance: 36.3 mL/min (A) (by C-G formula based on SCr of 1.76 mg/dL (H)). Liver Function Tests: Recent Labs  Lab 05/18/18 0702 05/20/18 0112  AST 55* 72*  ALT 17 27  ALKPHOS 47 45  BILITOT 1.4* 0.8  PROT 6.2* 6.3*  ALBUMIN 3.5 3.5   No results for input(s): LIPASE, AMYLASE in the last 168 hours. No results for input(s): AMMONIA in the last 168 hours. Coagulation Profile: No results for input(s): INR, PROTIME in the last 168 hours. Cardiac Enzymes: No results for input(s): CKTOTAL, CKMB, CKMBINDEX, TROPONINI in the last 168 hours. BNP (last 3 results) No results for input(s): PROBNP in the last 8760 hours. HbA1C: No results for input(s): HGBA1C in the last 72  hours. CBG: Recent Labs  Lab 05/17/18 1122 05/17/18 1636 05/18/18 0654 05/18/18 0805 05/18/18 1147  GLUCAP 85 99 100* 100* 90   Lipid Profile: No results for input(s): CHOL, HDL, LDLCALC, TRIG, CHOLHDL, LDLDIRECT in the last 72 hours. Thyroid Function Tests: No results for input(s): TSH, T4TOTAL, FREET4, T3FREE, THYROIDAB in the last 72 hours. Anemia Panel: No results for input(s): VITAMINB12, FOLATE, FERRITIN, TIBC, IRON, RETICCTPCT in the last 72 hours. Urine analysis:    Component Value Date/Time   COLORURINE YELLOW 10/16/2014 [place] 10/16/2014 1203   LABSPEC 1.020 10/16/2014 1203   PHURINE 6.0 10/16/2014 1203   GLUCOSEU NEGATIVE 10/16/2014 1203   HGBUR NEGATIVE 10/16/2014 1203   HGBUR negative 10/06/2006 0756   BILIRUBINUR negative 09/09/2017 1217   KETONESUR NEGATIVE 10/16/2014 1203   PROTEINUR Positive (A) 09/09/2017 1217   UROBILINOGEN 0.2 09/09/2017 1217   UROBILINOGEN 1.0 10/16/2014 1203   NITRITE negative 09/09/2017 1217   NITRITE NEGATIVE 10/16/2014 1203   LEUKOCYTESUR Negative 09/09/2017 1217   Sepsis Labs: !!!!!!!!!!!!!!!!!!!!!!!!!!!!!!!!!!!!!!!!!!!! '@LABRCNTIP'$ (procalcitonin:4,lacticidven:4) )No results found for this or any previous visit (from the past 240 hour(s)).   Radiological Exams on Admission: No results found.  Old chart reviewed Case discussed with Dr. Florina Ou in the ED  Assessment/Plan 84 year old male with recurrent active GI bleed with worsening anemia Principal Problem:   Acute GI bleeding-obtain stat nuclear red tagged blood scan to localize the bleed.  His blood pressure soft.  Given 1 L of IV fluids now.  Admit patient to stepdown unit.  Serial hemoglobin and if it drops less than 8 transfuse.  Monitor closely.  He is not had any significant bleeding since arrival to the emergency department.  Abdominal exam is benign.  Has had a significant drop in his hemoglobin over the last 3 days.  Hold aspirin.  Active  Problems:   Anemia associated with acute blood loss-hemoglobin down to 9 from 12 in the last week.  Repeat again at 5 AM.  Transfuse as needed.    Essential hypertension-hold all blood pressure medications at this time secondary to soft blood pressure in the setting of GI bleed    COPD with emphysema, on O2-stable continue home oxygen    Pulmonary fibrosis (HCC)-stable continue home oxygen    DVT prophylaxis: SCDs Code Status: Full Family Communication: Wife Disposition Plan: Days Consults called: Need to call GI in the morning Dr. Benson Norway Admission status: Admission   Alyna Stensland A MD [facility]  If 7PM-7AM, please contact night-coverage [email or web address] Password [facility]  05/20/2018, 3:25 AM

## 2018-05-20 NOTE — Progress Notes (Addendum)
This patient was admitted by my colleague early this morning for bright red blood per rectum and acute blood loss anemia. The patient was feeling weak and dizzy. His hemoglobin had dropped from 12 recorded on the day of his previous discharge to 9 on admission and 7.9 later this morning. He will be going to nuclear medicine for tagged RBC scan. His hemoglobin will be monitored and he will be transfused for hemoglobin less than 7.0. Monitor. FOBT is positive.Vitals are within normal limits.  NM RBC scan did not demonstrate a source for the patient's bleeding. I have discussed the patient with Dr. Benson Norway. He will see the patient in consult.

## 2018-05-20 NOTE — ED Notes (Signed)
POC occult blood card at bedside for provider

## 2018-05-20 NOTE — ED Notes (Signed)
ED TO INPATIENT HANDOFF REPORT  Name/Age/Gender Bradley Johns 84 y.o. male  Code Status Code Status History    Date Active Date Inactive Code Status Order ID Comments User Context   05/17/2018 0916 05/18/2018 2037 Full Code [id number]  Caren Griffins, MD Inpatient      Home/SNF/Other Home  Chief Complaint Rectal Bleeding  Level of Care/Admitting Diagnosis ED Disposition    ED Disposition Condition [place]: [place] [100102]  Level of Care: Stepdown [14]  Admit to SDU based on following criteria: Hemodynamic compromise or significant risk of instability:  Patient requiring short term acute titration and management of vasoactive drips, and invasive monitoring (i.e., CVP and Arterial line).  Diagnosis: GIB (gastrointestinal bleeding) [315176]  Admitting Physician: Phillips Grout [[id number]]  Attending Physician: Derrill Kay A [[id number]]  Estimated length of stay: 3 - 4 days  Certification:: I certify this patient will need inpatient services for at least 2 midnights  PT Class (Do Not Modify): Inpatient [101]  PT Acc Code (Do Not Modify): Private [1]       Medical History Past Medical History:  Diagnosis Date  . Arthritis   . Benign prostatic hypertrophy   . COPD (chronic obstructive pulmonary disease) ([place])    24/7 O2  . Diabetes mellitus    per Dr Posey Pronto  . Epidermal cyst of face    left lower lateral cheek  . Gout   . Hyperlipidemia   . Hypertension   . Pulmonary fibrosis ([place]) 2014  . Sleep apnea     Allergies No Known Allergies  IV Location/Drains/Wounds Patient Lines/Drains/Airways Status   Active Line/Drains/Airways    Name:   Placement date:   Placement time:   Site:   Days:   Peripheral IV 05/20/18 Right Antecubital   05/20/18    0123    Antecubital   less than 1          Labs/Imaging Results for orders placed or performed during the hospital encounter of 05/20/18 (from the past 48 hour(s))  ABO/Rh      Status: None (Preliminary result)   Collection Time: 05/20/18  1:01 AM  Result Value Ref Range   ABO/RH(D)      O POS Performed at [facility], [place] [address] [place], [place] [address]   Comprehensive metabolic panel     Status: Abnormal   Collection Time: 05/20/18  1:12 AM  Result Value Ref Range   Sodium 140 135 - 145 mmol/L   Potassium 4.3 3.5 - 5.1 mmol/L   Chloride 107 98 - 111 mmol/L   CO2 27 22 - 32 mmol/L   Glucose, Bld 141 (H) 70 - 99 mg/dL   BUN 20 8 - 23 mg/dL   Creatinine, Ser 1.76 (H) 0.61 - 1.24 mg/dL   Calcium 8.3 (L) 8.9 - 10.3 mg/dL   Total Protein 6.3 (L) 6.5 - 8.1 g/dL   Albumin 3.5 3.5 - 5.0 g/dL   AST 72 (H) 15 - 41 U/L   ALT 27 0 - 44 U/L   Alkaline Phosphatase 45 38 - 126 U/L   Total Bilirubin 0.8 0.3 - 1.2 mg/dL   GFR calc non Af Amer 35 (L) >60 mL/min   GFR calc Af Amer 41 (L) >60 mL/min   Anion gap 6 5 - 15    Comment: Performed at [facility], [place] [address] [place], [place] [address]  CBC  Status: Abnormal   Collection Time: 05/20/18  1:12 AM  Result Value Ref Range   WBC 8.4 4.0 - 10.5 K/uL   RBC 3.06 (L) 4.22 - 5.81 MIL/uL   Hemoglobin 9.5 (L) 13.0 - 17.0 g/dL   HCT 30.8 (L) 39.0 - 52.0 %   MCV 100.7 (H) 80.0 - 100.0 fL   MCH 31.0 26.0 - 34.0 pg   MCHC 30.8 30.0 - 36.0 g/dL   RDW 14.4 11.5 - 15.5 %   Platelets 177 150 - 400 K/uL   nRBC 0.0 0.0 - 0.2 %    Comment: Performed at [facility], [place] [address] [place], [place] [address]  Type and screen [place]     Status: None   Collection Time: 05/20/18  1:13 AM  Result Value Ref Range   ABO/RH(D) O POS    Antibody Screen NEG    Sample Expiration      05/23/2018 Performed at [facility], [place] [address], [place], [place] [address]   POC occult blood, ED     Status: Abnormal   Collection Time: 05/20/18  2:43 AM  Result Value Ref Range   Fecal Occult Bld POSITIVE (A) NEGATIVE   No  results found.  Pending Labs Unresulted Labs (From admission, onward)   None      Vitals/Pain Today's Vitals   05/20/18 0025 05/20/18 0048 05/20/18 0122 05/20/18 0257  BP: (!) 91/34   (!) 122/49  Pulse: 72   66  Resp: 18   18  Temp: 97.9 F (36.6 C)   97.9 F (36.6 C)  TempSrc: Oral   Oral  SpO2: 92%   99%  Weight: 92.5 kg     Height: 5\' 8"  (1.727 m)     PainSc:  0-No pain 0-No pain     Isolation Precautions No active isolations  Medications Medications  sodium chloride 0.9 % bolus 1,000 mL (has no administration in time range)    Mobility Walks with assistance

## 2018-05-20 NOTE — Progress Notes (Signed)
Pt arrived in the ICU/SD (Rm 1233) at approximately 0400.  Pt not pulled over in Epic d/t charge nurse and house secretary off the floor on a rapid response call.  Jacqulyn Ducking ICU/SD Care Coordinator / Rapid Response Nurse Rapid Response Number:  [phone]

## 2018-05-20 NOTE — ED Triage Notes (Signed)
Patient states he is having clots and blood when he goes to the bathroom. Patient states he had a colonoscopy done on Tuesday.

## 2018-05-21 ENCOUNTER — Telehealth: Payer: Self-pay | Admitting: Internal Medicine

## 2018-05-21 DIAGNOSIS — K922 Gastrointestinal hemorrhage, unspecified: Secondary | ICD-10-CM | POA: Diagnosis not present

## 2018-05-21 LAB — CBC WITH DIFFERENTIAL/PLATELET
Abs Immature Granulocytes: 0.02 10*3/uL (ref 0.00–0.07)
Basophils Absolute: 0 10*3/uL (ref 0.0–0.1)
Basophils Relative: 0 %
Eosinophils Absolute: 0.2 10*3/uL (ref 0.0–0.5)
Eosinophils Relative: 4 %
HCT: 26.1 % — ABNORMAL LOW (ref 39.0–52.0)
Hemoglobin: 7.6 g/dL — ABNORMAL LOW (ref 13.0–17.0)
Immature Granulocytes: 0 %
Lymphocytes Relative: 33 %
Lymphs Abs: 2 10*3/uL (ref 0.7–4.0)
MCH: 30.4 pg (ref 26.0–34.0)
MCHC: 29.1 g/dL — ABNORMAL LOW (ref 30.0–36.0)
MCV: 104.4 fL — ABNORMAL HIGH (ref 80.0–100.0)
Monocytes Absolute: 0.7 10*3/uL (ref 0.1–1.0)
Monocytes Relative: 11 %
Neutro Abs: 3.2 10*3/uL (ref 1.7–7.7)
Neutrophils Relative %: 52 %
Platelets: 143 10*3/uL — ABNORMAL LOW (ref 150–400)
RBC: 2.5 MIL/uL — ABNORMAL LOW (ref 4.22–5.81)
RDW: 14.3 % (ref 11.5–15.5)
WBC: 6 10*3/uL (ref 4.0–10.5)
nRBC: 0 % (ref 0.0–0.2)

## 2018-05-21 LAB — BASIC METABOLIC PANEL
Anion gap: 6 (ref 5–15)
BUN: 12 mg/dL (ref 8–23)
CO2: 24 mmol/L (ref 22–32)
Calcium: 7.7 mg/dL — ABNORMAL LOW (ref 8.9–10.3)
Chloride: 110 mmol/L (ref 98–111)
Creatinine, Ser: 1.04 mg/dL (ref 0.61–1.24)
GFR calc Af Amer: >60 mL/min (ref >60)
GFR calc non Af Amer: >60 mL/min (ref >60)
Glucose, Bld: 90 mg/dL (ref 70–99)
Potassium: 4.1 mmol/L (ref 3.5–5.1)
Sodium: 140 mmol/L (ref 135–145)

## 2018-05-21 LAB — PREPARE RBC (CROSSMATCH)

## 2018-05-21 LAB — HEMOGLOBIN AND HEMATOCRIT, BLOOD
HCT: 25.1 % — ABNORMAL LOW (ref 39.0–52.0)
HCT: 25.6 % — ABNORMAL LOW (ref 39.0–52.0)
Hemoglobin: 7.4 g/dL — ABNORMAL LOW (ref 13.0–17.0)
Hemoglobin: 7.7 g/dL — ABNORMAL LOW (ref 13.0–17.0)

## 2018-05-21 LAB — SAMPLE TO BLOOD BANK

## 2018-05-21 LAB — GLUCOSE, CAPILLARY
Glucose-Capillary: 84 mg/dL (ref 70–99)
Glucose-Capillary: 91 mg/dL (ref 70–99)
Glucose-Capillary: 159 mg/dL — ABNORMAL HIGH (ref 70–99)
Glucose-Capillary: 112 mg/dL — ABNORMAL HIGH (ref 70–99)
Glucose-Capillary: 140 mg/dL — ABNORMAL HIGH (ref 70–99)

## 2018-05-21 LAB — HEMOGLOBIN A1C
Hgb A1c MFr Bld: 6.9 % — ABNORMAL HIGH (ref 4.8–5.6)
Mean Plasma Glucose: 151.33 mg/dL

## 2018-05-21 MED ORDER — INSULIN ASPART 100 UNIT/ML ~~LOC~~ SOLN: 0.0000 [IU] | Freq: Three times a day (TID) | SUBCUTANEOUS | Status: DC

## 2018-05-21 MED ORDER — CARVEDILOL 12.5 MG PO TABS
12.5000 mg | ORAL_TABLET | Freq: Two times a day (BID) | ORAL | Status: DC
  Administered 2018-05-22: 12.5 mg via ORAL
  Filled 2018-05-21: qty 1

## 2018-05-21 MED ORDER — LIP MEDEX EX OINT
TOPICAL_OINTMENT | CUTANEOUS | Status: DC | PRN
  Filled 2018-05-21: qty 7

## 2018-05-21 MED ORDER — DOXAZOSIN MESYLATE 1 MG PO TABS
4.0000 mg | ORAL_TABLET | Freq: Every day | ORAL | Status: DC
  Administered 2018-05-22 – 2018-05-24 (×3): 4 mg via ORAL
  Filled 2018-05-21 (×3): qty 4

## 2018-05-21 MED ORDER — INSULIN ASPART 100 UNIT/ML ~~LOC~~ SOLN
0.0000 [IU] | Freq: Three times a day (TID) | SUBCUTANEOUS | Status: DC
  Administered 2018-05-21: 2 [IU] via SUBCUTANEOUS
  Administered 2018-05-22 – 2018-05-23 (×3): 1 [IU] via SUBCUTANEOUS
  Administered 2018-05-24 (×2): 2 [IU] via SUBCUTANEOUS
  Administered 2018-05-25: 1 [IU] via SUBCUTANEOUS

## 2018-05-21 MED ORDER — SODIUM CHLORIDE 0.9% IV SOLUTION: Freq: Once | INTRAVENOUS | Status: DC

## 2018-05-21 MED ORDER — INSULIN ASPART 100 UNIT/ML ~~LOC~~ SOLN
0.0000 [IU] | Freq: Every day | SUBCUTANEOUS | Status: DC
  Administered 2018-05-24: 2 [IU] via SUBCUTANEOUS

## 2018-05-21 NOTE — Progress Notes (Addendum)
PROGRESS NOTE  Bradley Johns WSF:[id number] DOB: [birth year] DOA: 05/20/2018 PCP: Colon Branch, MD  HPI/Recap of past 24 hours: Bradley Johns is a 84 y.o. male with medical history significant of pulmonary fibrosis and COPD on 3 to 4 L of oxygen chronically at home per nasal cannula, hypertension just discharged on February 11 for lower GI bleed had a colonoscopy done by Dr. Benson Norway showing diverticulosis without any evidence of active bleeding had a polyp that was removed.  Patient reports his bleeding seemed to have stopped prior to his discharge she was sent home and his aspirin was resumed.  He was at home 24 hours when he started having bright red blood per rectum again that was darker in nature but not melanotic.  He denies any nausea vomiting or any abdominal pain.  He denies any fevers.  His hemoglobin has dropped from 12 on that admission down to 9 now.  He is starting to get dizzy and weak.  Patient is being referred for admission for lower GI bleed.  05/21/18: Patient seen and examined at his bedside.  No acute events overnight.  Denies abdominal pain, nausea, hematochezia or melena.  No new complaints.  His hemoglobin is stable and holding up.  Assessment/Plan: Principal Problem:   Acute GI bleeding Active Problems:   Essential hypertension   COPD with emphysema, on O2   Pulmonary fibrosis (HCC)   GIB (gastrointestinal bleeding)   Anemia associated with acute blood loss  Acute presumed lower GI bleed/diverticular bleed   Suspected diverticular bleed which is unpredictable.   GI consulted and following. Stat nuclear red tagged blood scan to localize the bleed was unremarkable Positive FOBT Continue to hold aspirin  Acute blood loss suspect secondary to presumed diverticular bleed Hemoglobin stable 7.7 from 7.4 from 7.6 Repeat CBC in the morning   AKI, resolved Presented with cr 1.76 Creatinine today appears to be back to baseline 1.0 with GFR greater than 60 Avoid  nephrotoxic agents/dehydration/hypotension Monitor urine output Repeat BMP in the morning   Essential hypertension-home blood pressure medications have been resumed.  Continue to monitor vital signs.  Type 2 diabetes with hyperglycemia Obtain A1c Start insulin sliding scale, sensitive Hold off oral antidiabetic medications   COPD with emphysema, on 3 to 4 L of oxygen by nasal cannula continuously at baseline.  On O2-stable continue home oxygen   Pulmonary fibrosis (HCC)-stable continue home oxygen  OSA Recommend CPAP at night    DVT prophylaxis: SCDs Code Status: Full Family Communication:  None at bedside Disposition Plan:  Possible discharge to home tomorrow 05/22/2018 Consults called:  GI Dr. Benson Norway      Objective: Vitals:   05/21/18 0442 05/21/18 0500 05/21/18 0800 05/21/18 1021  BP:  (!) 146/52 (!) 142/51 (!) 118/37  Pulse:  (!) 58 68 68  Resp:  15 19   Temp:   97.8 F (36.6 C)   TempSrc:   Oral   SpO2:  97% 99%   Weight: 90.6 kg     Height:        Intake/Output Summary (Last 24 hours) at 05/21/2018 1259 Last data filed at 05/21/2018 0000 Gross per 24 hour  Intake 120 ml  Output 1150 ml  Net -1030 ml   Filed Weights   05/20/18 0025 05/20/18 0443 05/21/18 0442  Weight: 92.5 kg 90.3 kg 90.6 kg    Exam:  . General: 84 y.o. year-old male well developed well nourished in no acute distress.  Alert and oriented  x3. . Cardiovascular: Regular rate and rhythm with no rubs or gallops.  No thyromegaly or JVD noted.   Marland Kitchen Respiratory: Clear to auscultation with no wheezes or rales. Good inspiratory effort. . Abdomen: Soft nontender nondistended with normal bowel sounds x4 quadrants. . Musculoskeletal: No lower extremity edema. 2/4 pulses in all 4 extremities. . Skin: No ulcerative lesions noted or rashes, . Psychiatry: Mood is appropriate for condition and setting   Data Reviewed: CBC: Recent Labs  Lab 05/16/18 2238  05/18/18 0702 05/18/18 1513  05/20/18 0112 05/20/18 0624 05/20/18 1920 05/21/18 0252 05/21/18 1055  WBC 8.2   < > 5.6 5.7 8.4 5.7  --  6.0  --   NEUTROABS 5.7  --   --   --   --   --   --  3.2  --   HGB 12.0*   < > 10.8* 11.0* 9.5* 7.9* 7.4* 7.4*  7.6* 7.7*  HCT 39.5   < > 35.3* 36.5* 30.8* 25.9* 24.4* 25.1*  26.1* 25.6*  MCV 100.8*   < > 100.6* 100.6* 100.7* 103.2*  --  104.4*  --   PLT 163   < > 168 177 177 127*  --  143*  --    < > = values in this interval not displayed.   Basic Metabolic Panel: Recent Labs  Lab 05/16/18 2238 05/18/18 0702 05/20/18 0112 05/20/18 0624 05/21/18 0252  NA 137 138 140 141 140  K 4.9 4.3 4.3 4.0 4.1  CL 105 104 107 111 110  CO2 19* 26 27 27 24   GLUCOSE 144* 106* 141* 124* 90  BUN 17 12 20 20 12   CREATININE 1.45* 1.05 1.76* 1.42* 1.04  CALCIUM 8.3* 8.2* 8.3* 7.7* 7.7*   GFR: Estimated Creatinine Clearance: 60.9 mL/min (by C-G formula based on SCr of 1.04 mg/dL). Liver Function Tests: Recent Labs  Lab 05/18/18 0702 05/20/18 0112  AST 55* 72*  ALT 17 27  ALKPHOS 47 45  BILITOT 1.4* 0.8  PROT 6.2* 6.3*  ALBUMIN 3.5 3.5   No results for input(s): LIPASE, AMYLASE in the last 168 hours. No results for input(s): AMMONIA in the last 168 hours. Coagulation Profile: No results for input(s): INR, PROTIME in the last 168 hours. Cardiac Enzymes: No results for input(s): CKTOTAL, CKMB, CKMBINDEX, TROPONINI in the last 168 hours. BNP (last 3 results) No results for input(s): PROBNP in the last 8760 hours. HbA1C: No results for input(s): HGBA1C in the last 72 hours. CBG: Recent Labs  Lab 05/20/18 2208 05/20/18 2350 05/21/18 0425 05/21/18 0822 05/21/18 1224  GLUCAP 111* 93 84 91 159*   Lipid Profile: No results for input(s): CHOL, HDL, LDLCALC, TRIG, CHOLHDL, LDLDIRECT in the last 72 hours. Thyroid Function Tests: No results for input(s): TSH, T4TOTAL, FREET4, T3FREE, THYROIDAB in the last 72 hours. Anemia Panel: No results for input(s): VITAMINB12, FOLATE,  FERRITIN, TIBC, IRON, RETICCTPCT in the last 72 hours. Urine analysis:    Component Value Date/Time   COLORURINE YELLOW 10/16/2014 [place] 10/16/2014 1203   LABSPEC 1.020 10/16/2014 1203   PHURINE 6.0 10/16/2014 1203   GLUCOSEU NEGATIVE 10/16/2014 1203   HGBUR NEGATIVE 10/16/2014 1203   HGBUR negative 10/06/2006 0756   BILIRUBINUR negative 09/09/2017 1217   KETONESUR NEGATIVE 10/16/2014 1203   PROTEINUR Positive (A) 09/09/2017 1217   UROBILINOGEN 0.2 09/09/2017 1217   UROBILINOGEN 1.0 10/16/2014 1203   NITRITE negative 09/09/2017 1217   NITRITE NEGATIVE 10/16/2014 1203   LEUKOCYTESUR Negative 09/09/2017 1217  Sepsis Labs: @LABRCNTIP (procalcitonin:4,lacticidven:4)  ) Recent Results (from the past 240 hour(s))  MRSA PCR Screening     Status: None   Collection Time: 05/20/18  4:40 AM  Result Value Ref Range Status   MRSA by PCR NEGATIVE NEGATIVE Final    Comment:        The GeneXpert MRSA Assay (FDA approved for NASAL specimens only), is one component of a comprehensive MRSA colonization surveillance program. It is not intended to diagnose MRSA infection nor to guide or monitor treatment for MRSA infections. Performed at [facility], [place] [address], [place], [place] [address]       Studies: No results found.  Scheduled Meds: . amLODipine  10 mg Oral Daily  . carvedilol  12.5 mg Oral BID  . doxazosin  4 mg Oral QHS  . insulin aspart  0-5 Units Subcutaneous QHS  . insulin aspart  0-9 Units Subcutaneous TID WC  . losartan  50 mg Oral Daily  . mouth rinse  15 mL Mouth Rinse BID    Continuous Infusions: . sodium chloride 100 mL/hr at 05/21/18 0437     LOS: 1 day     Kayleen Memos, MD [facility] Pager [phone]  If 7PM-7AM, please contact night-coverage [email or web address] Password [facility] 05/21/2018, 12:59 PM

## 2018-05-21 NOTE — Discharge Instructions (Signed)
Gastrointestinal Bleeding  Gastrointestinal bleeding is bleeding somewhere along the path food travels through the body (digestive tract). This path is anywhere between the mouth and the opening of the butt (anus). You may have blood in your poop (stools) or have black poop. If you throw up (vomit), there may be blood in it. This condition can be mild, serious, or even life-threatening. If you have a lot of bleeding, you may need to stay in the hospital. Follow these instructions at home:  Take over-the-counter and prescription medicines only as told by your doctor.  Eat foods that have a lot of fiber in them. These foods include whole grains, fruits, and vegetables. You can also try eating 1-3 prunes each day.  Drink enough fluid to keep your pee (urine) clear or pale yellow.  Keep all follow-up visits as told by your doctor. This is important. Contact a doctor if:  Your symptoms do not get better. Get help right away if:  Your bleeding gets worse.  You feel dizzy or you pass out (faint).  You feel weak.  You have very bad cramps in your back or belly (abdomen).  You pass large clumps of blood (clots) in your poop.  Your symptoms are getting worse. This information is not intended to replace advice given to you by your health care provider. Make sure you discuss any questions you have with your health care provider. Document Released: 01/01/2008 Document Revised: 08/30/2015 Document Reviewed: 09/11/2014 Elsevier Interactive Patient Education  2019 [place] Gastrointestinal Bleeding  Lower gastrointestinal (GI) bleeding is the result of bleeding from the colon, rectum, or anal area. The colon is the last part of the digestive tract, where stool, also called feces, is formed. If you have lower GI bleeding, you may see blood in or on your stool. It may be bright red. Lower GI bleeding often stops without treatment. Continued or heavy bleeding needs emergency treatment  at the hospital. What are the causes? Lower GI bleeding may be caused by:  A condition that causes pouches to form in the colon over time (diverticulosis).  Swelling and irritation (inflammation) in areas with diverticulosis (diverticulitis).  Inflammation of the colon (inflammatory bowel disease).  Swollen veins in the rectum (hemorrhoids).  Painful tears in the anus (anal fissures), often caused by passing hard stools.  Cancer of the colon or rectum.  Noncancerous growths (polyps) of the colon or rectum.  A bleeding disorder that impairs the formation of blood clots and causes easy bleeding (coagulopathy).  An abnormal weakening of a blood vessel where an artery and a vein come together (arteriovenous malformation). What increases the risk? You are more likely to develop this condition if:  You are older than 82 years of age.  You take aspirin or NSAIDs on a regular basis.  You take anticoagulant or antiplatelet drugs.  You have a history of high-dose X-ray treatment (radiation therapy) of the colon.  You recently had a colon polyp removed. What are the signs or symptoms? Symptoms of this condition include:  Bright red blood or blood clots coming from your rectum.  Bloody stools.  Black or maroon-colored stools.  Pain or cramping in the abdomen.  Weakness or dizziness.  Racing heartbeat. How is this diagnosed? This condition may be diagnosed based on:  Your symptoms and medical history.  A physical exam. During the exam, your health care provider will check for signs of blood loss, such as low blood pressure and a rapid pulse.  Tests, such as: ? Flexible sigmoidoscopy. In this procedure, a flexible tube with a camera on the end is used to examine your anus and the first part of your colon to look for the source of bleeding. ? Colonoscopy. This is similar to a flexible sigmoidoscopy, but the camera can extend all the way to the uppermost part of your  colon. ? Blood tests to measure your red blood cell count and to check for coagulopathy. ? An imaging study of your colon to look for a bleeding site. In some cases, you may have X-rays taken after a dye or radioactive substance is injected into your bloodstream (angiogram). How is this treated? Treatment for this condition depends on the cause of the bleeding. Heavy or persistent bleeding is treated at the hospital. Treatment may include:  Getting fluids through an IV tube inserted into one of your veins.  Getting blood through an IV tube (blood transfusion).  Stopping bleeding through high-heat coagulation, injections of certain medicines, or applying surgical clips. This can all be done during a colonoscopy.  Having a procedure that involves first doing an angiogram and then blocking blood flow to the bleeding site (embolization).  Stopping some of your regular medicines for a certain amount of time.  Having surgery to remove part of the colon. This may be needed if bleeding is severe and does not respond to other treatment. Follow these instructions at home:  Take over-the-counter and prescription medicines only as told by your health care provider. You may need to avoid aspirin, NSAIDs, or other medicines that increase bleeding.  Eat foods that are high in fiber. This will help keep your stools soft. These foods include whole grains, legumes, fruits, and vegetables. Eating 1-3 prunes each day works well for many people.  Drink enough fluid to keep your urine clear or pale yellow.  Keep all follow-up visits as told by your health care provider. This is important. Contact a health care provider if:  Your symptoms do not improve. Get help right away if:  Your bleeding increases.  You feel light-headed or you faint.  You feel weak.  You have severe cramps in your back or abdomen.  You pass large blood clots in your stool.  Your symptoms get worse. This information is not  intended to replace advice given to you by your health care provider. Make sure you discuss any questions you have with your health care provider. Document Released: 08/09/2015 Document Revised: 08/30/2015 Document Reviewed: 08/09/2015 Elsevier Interactive Patient Education  2019 [place].   Rectal Bleeding  Rectal bleeding is when blood comes out of the opening of the butt (anus). People with this kind of bleeding may notice bright red blood in their underwear or in the toilet after they poop (have a bowel movement). They may also have dark red or black poop (stool). Rectal bleeding is often a sign that something is wrong. It needs to be checked by a doctor. Follow these instructions at home: Watch for any changes in your condition. Take these actions to help with bleeding and discomfort:  Eat a diet that is high in fiber. This will keep your poop soft so it is easier for you to poop without pushing too hard. Ask your doctor to tell you what foods and drinks are high in fiber.  Drink enough fluid to keep your pee (urine) clear or pale yellow. This also helps keep your poop soft.  Try taking a warm bath. This may help with pain.  Keep all follow-up visits as told by your doctor. This is important. Get help right away if:  You have new bleeding.  You have more bleeding than before.  You have black or dark red poop.  You throw up (vomit) blood or something that looks like coffee grounds.  You have pain or tenderness in your belly (abdomen).  You have a fever.  You feel weak.  You feel sick to your stomach (nauseous).  You pass out (faint).  You have very bad pain in your butt.  You cannot poop. This information is not intended to replace advice given to you by your health care provider. Make sure you discuss any questions you have with your health care provider. Document Released: 12/04/2010 Document Revised: 08/30/2015 Document Reviewed: 05/20/2015 Elsevier Interactive  Patient Education  [employer].

## 2018-05-21 NOTE — Progress Notes (Signed)
Subjective: No further bleeding since being admitted.  Objective: Vital signs in last 24 hours: Temp:  [97.7 F (36.5 C)-98.6 F (37 C)] 97.8 F (36.6 C) (02/14 0800) Pulse Rate:  [56-88] 68 (02/14 0800) Resp:  [13-24] 19 (02/14 0800) BP: (112-160)/(34-55) 142/51 (02/14 0800) SpO2:  [91 %-100 %] 99 % (02/14 0800) Weight:  [90.6 kg] 90.6 kg (02/14 0442) Last BM Date: 05/19/18  Intake/Output from previous day: 02/13 0701 - 02/14 0700 In: 120 [P.O.:120] Out: 1150 [Urine:1150] Intake/Output this shift: No intake/output data recorded.  General appearance: alert and no distress GI: soft, non-tender; bowel sounds normal; no masses,  no organomegaly  Lab Results: Recent Labs    05/20/18 0112 05/20/18 0624 05/20/18 1920 05/21/18 0252  WBC 8.4 5.7  --  6.0  HGB 9.5* 7.9* 7.4* 7.4*  7.6*  HCT 30.8* 25.9* 24.4* 25.1*  26.1*  PLT 177 127*  --  143*   BMET Recent Labs    05/20/18 0112 05/20/18 0624 05/21/18 0252  NA 140 141 140  K 4.3 4.0 4.1  CL 107 111 110  CO2 27 27 24   GLUCOSE 141* 124* 90  BUN 20 20 12   CREATININE 1.76* 1.42* 1.04  CALCIUM 8.3* 7.7* 7.7*   LFT Recent Labs    05/20/18 0112  PROT 6.3*  ALBUMIN 3.5  AST 72*  ALT 27  ALKPHOS 45  BILITOT 0.8   PT/INR No results for input(s): LABPROT, INR in the last 72 hours. Hepatitis Panel No results for input(s): HEPBSAG, HCVAB, HEPAIGM, HEPBIGM in the last 72 hours. C-Diff No results for input(s): CDIFFTOX in the last 72 hours. Fecal Lactopherrin No results for input(s): FECLLACTOFRN in the last 72 hours.  Studies/Results: Nm Gi Blood Loss  Result Date: 05/20/2018 CLINICAL DATA:  Recent GI bleeding, occurring early this morning. Patient had a colonoscopy on 05/18/2018. EXAM: NUCLEAR MEDICINE GASTROINTESTINAL BLEEDING SCAN TECHNIQUE: Sequential abdominal images were obtained following intravenous administration of Tc-55m labeled red blood cells. RADIOPHARMACEUTICALS:  23.9 mCi Tc-63m pertechnetate  in-vitro labeled red cells. COMPARISON:  None. FINDINGS: There is no abnormal radiotracer accumulation to indicate a GI bleeding source. Physiologic uptake is noted. There is some uptake below the perineum consistent with urine leakage. IMPRESSION: 1. No evidence of a GI bleeding source. Electronically Signed   By: Lajean Manes M.D.   On: 05/20/2018 12:36    Medications:  Scheduled: . amLODipine  10 mg Oral Daily  . carvedilol  12.5 mg Oral BID  . doxazosin  4 mg Oral QHS  . losartan  50 mg Oral Daily  . mouth rinse  15 mL Mouth Rinse BID   Continuous: . sodium chloride 100 mL/hr at 05/21/18 0437    Assessment/Plan: 1) Diverticular bleed. 2) Anemia.   The patient is well.  This readmission is not a surprise as diverticular bleeds are erratic.  His HGB is stable and he has not needed a blood transfusion.    Plan: 1) Advance diet to regular. 2) ? D/C home tomorrow.  Again, he can rebleed at any time point.  LOS: 1 day   Caliber Landess D 05/21/2018, 9:44 AM

## 2018-05-21 NOTE — Discharge Summary (Addendum)
Discharge Summary  Bradley Johns NUU:[id number] DOB: [birth year]  PCP: Colon Branch, MD  Admit date: 05/20/2018 Discharge date: 05/21/2018  Time spent: 35 minutes  DISCHARGE delayed due to the patient desaturating without adequate O2 supplementation for mobilization and traveling. In addition, he has not had a bowel movement today and is concerned about rectal bleeding with a bowel movement. Will provide a solid diet and continue to closely monitor overnight.  Also will benefit from PRBC transfusion in the setting of recent presumed diverticular bleed to maintain hemodynamic stability. Will transfuse 2U PRBCs and reassess in the morning.    Recommendations for Outpatient Follow-up:  1. Follow-up with GI 2. Follow-up with your primary care provider on Monday, 05/24/2018 3. Repeat CBC on Monday at your PCPs office 4. Follow-up with your pulmonologist 5. Take your medications as prescribed  Discharge Diagnoses:  Active Hospital Problems   Diagnosis Date Noted  . Acute GI bleeding 05/17/2018  . GIB (gastrointestinal bleeding) 05/20/2018  . Anemia associated with acute blood loss 05/20/2018  . Pulmonary fibrosis ([place]) 12/14/2012  . COPD with emphysema, on O2 04/29/2006  . Essential hypertension 04/29/2006    Resolved Hospital Problems  No resolved problems to display.    Discharge Condition: Stable  Diet recommendation: Resume previous diet  Vitals:   05/21/18 1200 05/21/18 1600  BP: (!) 125/45 (!) 144/45  Pulse: (!) 57 60  Resp: 13 15  Temp: (!) 97.4 F (36.3 C)   SpO2: 99% 98%    History of present illness:  Bradley Johns a 83 y.o.malewith medical history significant ofpulmonary fibrosis and COPD on 3 to 4 L of oxygen chronically at home per nasal cannula, hypertension just discharged on February 11 for lower GI bleed had a colonoscopy done by Dr. Benson Johns showing diverticulosis without any evidence of active bleeding had a polyp that was removed. Patient reports  his bleeding seemed to have stopped prior to his discharge she was sent home and his aspirin was resumed. He was at home 24 hours when he started having bright red blood per rectum again that was darker in nature but not melanotic. He denies any nausea vomiting or any abdominal pain. He denies any fevers. His hemoglobin has dropped from 12 on that admission down to 9 now. He is starting to get dizzy and weak. Patient is being referred for admission for lower GI bleed.  05/21/18: Patient seen and examined at his bedside.  No acute events overnight.  Denies abdominal pain, nausea, hematochezia or melena.  No new complaints.  His hemoglobin is stable and holding up.   The day of discharge, the patient was hemodynamically stable.  He will need to follow-up with his gastroenterologist and PCP post hospitalization.  He will also need to take his medications as prescribed.  Repeat CBC on Monday, 05/24/2018.  Hospital Course:  Principal Problem:   Acute GI bleeding Active Problems:   Essential hypertension   COPD with emphysema, on O2   Pulmonary fibrosis (HCC)   GIB (gastrointestinal bleeding)   Anemia associated with acute blood loss  Acute presumed lower GI bleed/diverticular bleed Presented with hematochezia with drop of hemoglobin.  Down to 7.4 with baseline Hg 12. Suspected diverticular bleed which is unpredictable.   GI consulted and followed. Stat nuclear red tagged blood scan to localize the bleed was unremarkable Positive FOBT GI will see again in the am Will transfuse 2U PRBCs tonight and repeat CBC in the am Hold off antihypertensives tonight and stop IV  fluid during blood transfusion.  Acute blood loss suspect secondary to presumed diverticular bleed Hemoglobin 7.7 from 7.4  Baseline Hg 12 Will transfuse 2U PRBCs Management as stated above  AKI, resolved Presented with cr 1.76 Creatinine today appears to be back to baseline 1.0 with GFR greater than 60 Avoid nephrotoxic  agents/dehydration/hypotension Monitor urine output Follow-up with your PCP  Essential hypertension-home blood pressure medications have been resumed but holding tonight as BP is soft.  Stop IV fluid and transfuse 2U PRBCs Continue to monitor vital signs.  Type 2 diabetes with hyperglycemia Obtain A1C and start ISS sensitive Follow with PCP after dc At discharge Resume oral antidiabetic medications  COPD with emphysema, on 3 to 4 L of oxygen by nasal cannula continuously at baseline.  Maintain O2 sat >92%  Pulmonary fibrosis (HCC)-stable continue home oxygen  OSA Recommend CPAP at night     Code Status:Full  Consults called: GI Dr. Benson Johns     Discharge Exam: BP (!) 144/45   Pulse 60   Temp (!) 97.4 F (36.3 C) (Oral)   Resp 15   Ht '5\' 8"'$  (1.727 m)   Wt 90.6 kg   SpO2 98%   BMI 30.37 kg/m  . General: 84 y.o. year-old male well developed well nourished in no acute distress.  Alert and oriented x3. . Cardiovascular: Regular rate and rhythm with no rubs or gallops.  No thyromegaly or JVD noted.   Marland Kitchen Respiratory: Clear to auscultation with no wheezes or rales. Good inspiratory effort. . Abdomen: Soft nontender nondistended with normal bowel sounds x4 quadrants. . Musculoskeletal: No lower extremity edema. 2/4 pulses in all 4 extremities. . Skin: No ulcerative lesions noted or rashes, . Psychiatry: Mood is appropriate for condition and setting  Discharge Instructions You were cared for by a hospitalist during your hospital stay. If you have any questions about your discharge medications or the care you received while you were in the hospital after you are discharged, you can call the unit and asked to speak with the hospitalist on call if the hospitalist that took care of you is not available. Once you are discharged, your primary care physician will handle any further medical issues. Please note that NO REFILLS for any discharge medications will be authorized  once you are discharged, as it is imperative that you return to your primary care physician (or establish a relationship with a primary care physician if you do not have one) for your aftercare needs so that they can reassess your need for medications and monitor your lab values.   Allergies as of 05/21/2018   No Known Allergies     Medication List    STOP taking these medications   aspirin 81 MG tablet     TAKE these medications   ACCU-CHEK AVIVA PLUS test strip Generic drug:  glucose blood Reported on 06/20/2015   ACCU-CHEK AVIVA PLUS w/Device Kit Reported on 06/20/2015   ACCU-CHEK SOFTCLIX LANCETS lancets Reported on 06/20/2015   amLODipine 10 MG tablet Commonly known as:  NORVASC Take 1 tablet (10 mg total) by mouth daily.   atorvastatin 80 MG tablet Commonly known as:  LIPITOR Take 1 tablet (80 mg total) by mouth daily. What changed:  when to take this   B-D UF III MINI PEN NEEDLES 31G X 5 MM Misc Generic drug:  Insulin Pen Needle Reported on 06/20/2015   carvedilol 12.5 MG tablet Commonly known as:  COREG Take 1 tablet (12.5 mg total) by mouth 2 (two)  times daily with a meal.   colchicine 0.6 MG tablet Take 1 tablet (0.6 mg total) by mouth 2 (two) times daily as needed. What changed:  reasons to take this   doxazosin 4 MG tablet Commonly known as:  CARDURA Take 1 tablet (4 mg total) by mouth at bedtime.   HYDROcodone-acetaminophen 5-325 MG tablet Commonly known as:  NORCO/VICODIN Take 1 tablet by mouth every 8 (eight) hours as needed. What changed:  reasons to take this   ketoconazole 2 % cream Commonly known as:  NIZORAL Apply 1 application topically daily. What changed:    when to take this  reasons to take this   losartan 50 MG tablet Commonly known as:  COZAAR Take 1 tablet (50 mg total) by mouth daily.   metFORMIN 500 MG tablet Commonly known as:  GLUCOPHAGE Take 500 mg by mouth 2 (two) times daily with a meal.   multivitamin tablet Take 1  tablet by mouth daily.   OXYGEN Inhale into the lungs. 3L Nocturnal and PRN for Hypoxemia   pioglitazone 45 MG tablet Commonly known as:  ACTOS Take 45 mg by mouth daily.      No Known Allergies Follow-up Information    Colon Branch, MD. Call in 1 day(s).   Specialty:  Internal Medicine Why:  Please call for a post hospital follow-up appointment. Contact information: [address] [place] [address] [phone]        Carol Ada, MD. Call in 1 day(s).   Specialty:  Gastroenterology Why:  Please call for post hospital follow-up appointment. Contact information: [place], [place] [address] [phone]            The results of significant diagnostics from this hospitalization (including imaging, microbiology, ancillary and laboratory) are listed below for reference.    Significant Diagnostic Studies: Nm Gi Blood Loss  Result Date: 05/20/2018 CLINICAL DATA:  Recent GI bleeding, occurring early this morning. Patient had a colonoscopy on 05/18/2018. EXAM: NUCLEAR MEDICINE GASTROINTESTINAL BLEEDING SCAN TECHNIQUE: Sequential abdominal images were obtained following intravenous administration of Tc-54mlabeled red blood cells. RADIOPHARMACEUTICALS:  23.9 mCi Tc-966mertechnetate in-vitro labeled red cells. COMPARISON:  None. FINDINGS: There is no abnormal radiotracer accumulation to indicate a GI bleeding source. Physiologic uptake is noted. There is some uptake below the perineum consistent with urine leakage. IMPRESSION: 1. No evidence of a GI bleeding source. Electronically Signed   By: DaLajean Manes.D.   On: 05/20/2018 12:36    Microbiology: Recent Results (from the past 240 hour(s))  MRSA PCR Screening     Status: None   Collection Time: 05/20/18  4:40 AM  Result Value Ref Range Status   MRSA by PCR NEGATIVE NEGATIVE Final    Comment:        The GeneXpert MRSA Assay (FDA approved for NASAL specimens only), is one  component of a comprehensive MRSA colonization surveillance program. It is not intended to diagnose MRSA infection nor to guide or monitor treatment for MRSA infections. Performed at [facility] [place] [id number]    Labs: Basic Metabolic Panel: Recent Labs  Lab 05/16/18 2238 05/18/18 0702 05/20/18 0112 05/20/18 0624 05/21/18 0252  NA 137 138 140 141 140  K 4.9 4.3 4.3 4.0 4.1  CL 105 104 107 111 110  CO2 19* '26 27 27 24  '$ GLUCOSE 144* 106* 141* 124* 90  BUN '17 12 20 20 '$ 12  CREATININE 1.45* 1.05 1.76* 1.42* 1.04  CALCIUM 8.3* 8.2* 8.3* 7.7* 7.7*   Liver Function Tests: Recent Labs  Lab 05/18/18 0702 05/20/18 0112  AST 55* 72*  ALT 17 27  ALKPHOS 47 45  BILITOT 1.4* 0.8  PROT 6.2* 6.3*  ALBUMIN 3.5 3.5   No results for input(s): LIPASE, AMYLASE in the last 168 hours. No results for input(s): AMMONIA in the last 168 hours. CBC: Recent Labs  Lab 05/16/18 2238  05/18/18 0702 05/18/18 1513 05/20/18 0112 05/20/18 0624 05/20/18 1920 05/21/18 0252 05/21/18 1055  WBC 8.2   < > 5.6 5.7 8.4 5.7  --  6.0  --   NEUTROABS 5.7  --   --   --   --   --   --  3.2  --   HGB 12.0*   < > 10.8* 11.0* 9.5* 7.9* 7.4* 7.4*  7.6* 7.7*  HCT 39.5   < > 35.3* 36.5* 30.8* 25.9* 24.4* 25.1*  26.1* 25.6*  MCV 100.8*   < > 100.6* 100.6* 100.7* 103.2*  --  104.4*  --   PLT 163   < > 168 177 177 127*  --  143*  --    < > = values in this interval not displayed.   Cardiac Enzymes: No results for input(s): CKTOTAL, CKMB, CKMBINDEX, TROPONINI in the last 168 hours. BNP: BNP (last 3 results) No results for input(s): BNP in the last 8760 hours.  ProBNP (last 3 results) No results for input(s): PROBNP in the last 8760 hours.  CBG: Recent Labs  Lab 05/20/18 2350 05/21/18 0425 05/21/18 0822 05/21/18 1224 05/21/18 1633  GLUCAP 93 84 91 159* 112*       Signed:  Kayleen Memos, MD [facility] 05/21/2018, 6:57 PM

## 2018-05-21 NOTE — Progress Notes (Signed)
Patient has orders for discharge. Patient is on 4L home o2 continuously. Patient's wife did not bring portable oxygen and patient and his wife are both stating that he will be fine for the ride home without oxygen. Spoke to MD [place] Crane and she stated that she recommends patient wearing oxygen. Agricultural consultant and [facility] offered to contact home health company to try and get portable oxygen but patient and wife refused and stated that pt will be okay without it. RN told patient that MD recommended wearing oxygen for the ride home. Patient and wife are upset that pt is being discharged since he has not had a BM since being admitted. RN, case management, and charge RN have explained to patient that he does not meet inpatient criteria due to stable vital signs and no signs of active bleeding.

## 2018-05-21 NOTE — Care Management CC44 (Signed)
Condition Code 44 Documentation Completed  Patient Details  Name: YEHONATAN GRANDISON MRN: [id number] Date of Birth: 08/12/1934   Condition Code 44 given:  Yes Patient signature on Condition Code 44 notice:  Yes Documentation of 2 MD's agreement:  Yes Code 44 added to claim:  Yes    Leeroy Cha, RN 05/21/2018, 3:14 PM

## 2018-05-21 NOTE — Care Management Obs Status (Signed)
[place] NOTIFICATION   Patient Details  Name: SHERMAR FRIEDLAND MRN: [id number] Date of Birth: [birth year]   Medicare Observation Status Notification Given:  Yes    Leeroy Cha, RN 05/21/2018, 3:14 PM

## 2018-05-21 NOTE — Telephone Encounter (Signed)
Please call the patient Monday, 05/24/2018, needs CBC, DX GI bleed as requested by the hospital team. He also need to follow-up for the hospital stay, I can do it Monday or at a later time during the week.

## 2018-05-22 ENCOUNTER — Observation Stay (HOSPITAL_COMMUNITY): Payer: Medicare PPO

## 2018-05-22 DIAGNOSIS — K922 Gastrointestinal hemorrhage, unspecified: Secondary | ICD-10-CM | POA: Diagnosis not present

## 2018-05-22 DIAGNOSIS — J441 Chronic obstructive pulmonary disease with (acute) exacerbation: Secondary | ICD-10-CM | POA: Diagnosis not present

## 2018-05-22 DIAGNOSIS — J181 Lobar pneumonia, unspecified organism: Secondary | ICD-10-CM | POA: Diagnosis not present

## 2018-05-22 LAB — COMPREHENSIVE METABOLIC PANEL
ALT: 26 U/L (ref 0–44)
AST: 40 U/L (ref 15–41)
Albumin: 3.3 g/dL — ABNORMAL LOW (ref 3.5–5.0)
Alkaline Phosphatase: 45 U/L (ref 38–126)
Anion gap: 7 (ref 5–15)
BUN: 11 mg/dL (ref 8–23)
CO2: 28 mmol/L (ref 22–32)
Calcium: 8.4 mg/dL — ABNORMAL LOW (ref 8.9–10.3)
Chloride: 105 mmol/L (ref 98–111)
Creatinine, Ser: 0.86 mg/dL (ref 0.61–1.24)
GFR calc Af Amer: >60 mL/min (ref >60)
GFR calc non Af Amer: >60 mL/min (ref >60)
Glucose, Bld: 87 mg/dL (ref 70–99)
Potassium: 3.9 mmol/L (ref 3.5–5.1)
Sodium: 140 mmol/L (ref 135–145)
Total Bilirubin: 1.2 mg/dL (ref 0.3–1.2)
Total Protein: 5.9 g/dL — ABNORMAL LOW (ref 6.5–8.1)

## 2018-05-22 LAB — MAGNESIUM: Magnesium: 1.6 mg/dL — ABNORMAL LOW (ref 1.7–2.4)

## 2018-05-22 LAB — CBC
HCT: 34.5 % — ABNORMAL LOW (ref 39.0–52.0)
Hemoglobin: 10.6 g/dL — ABNORMAL LOW (ref 13.0–17.0)
MCH: 30.3 pg (ref 26.0–34.0)
MCHC: 30.7 g/dL (ref 30.0–36.0)
MCV: 98.6 fL (ref 80.0–100.0)
Platelets: 155 10*3/uL (ref 150–400)
RBC: 3.5 MIL/uL — ABNORMAL LOW (ref 4.22–5.81)
RDW: 15.8 % — ABNORMAL HIGH (ref 11.5–15.5)
WBC: 6 10*3/uL (ref 4.0–10.5)
nRBC: 0 % (ref 0.0–0.2)

## 2018-05-22 LAB — GLUCOSE, CAPILLARY
Glucose-Capillary: 89 mg/dL (ref 70–99)
Glucose-Capillary: 145 mg/dL — ABNORMAL HIGH (ref 70–99)
Glucose-Capillary: 142 mg/dL — ABNORMAL HIGH (ref 70–99)
Glucose-Capillary: 130 mg/dL — ABNORMAL HIGH (ref 70–99)

## 2018-05-22 MED ORDER — FUROSEMIDE 10 MG/ML IJ SOLN
20.0000 mg | Freq: Two times a day (BID) | INTRAMUSCULAR | Status: DC
  Administered 2018-05-22 – 2018-05-23 (×2): 20 mg via INTRAVENOUS
  Filled 2018-05-22 (×2): qty 2

## 2018-05-22 MED ORDER — LOSARTAN POTASSIUM 50 MG PO TABS
50.0000 mg | ORAL_TABLET | Freq: Every day | ORAL | Status: DC
  Administered 2018-05-25: 50 mg via ORAL
  Filled 2018-05-22: qty 1

## 2018-05-22 MED ORDER — AMLODIPINE BESYLATE 10 MG PO TABS: 10.0000 mg | ORAL_TABLET | Freq: Every day | ORAL | Status: DC

## 2018-05-22 MED ORDER — FUROSEMIDE 10 MG/ML IJ SOLN
20.0000 mg | Freq: Once | INTRAMUSCULAR | Status: AC
  Administered 2018-05-22: 20 mg via INTRAVENOUS
  Filled 2018-05-22: qty 2

## 2018-05-22 MED ORDER — MAGNESIUM SULFATE 4 GM/100ML IV SOLN
4.0000 g | Freq: Once | INTRAVENOUS | Status: AC
  Administered 2018-05-22: 4 g via INTRAVENOUS
  Filled 2018-05-22: qty 100

## 2018-05-22 MED ORDER — IPRATROPIUM-ALBUTEROL 0.5-2.5 (3) MG/3ML IN SOLN: 3.0000 mL | Freq: Four times a day (QID) | RESPIRATORY_TRACT | Status: DC

## 2018-05-22 MED ORDER — IPRATROPIUM-ALBUTEROL 0.5-2.5 (3) MG/3ML IN SOLN: 3.0000 mL | RESPIRATORY_TRACT | Status: DC | PRN

## 2018-05-22 MED ORDER — CARVEDILOL 12.5 MG PO TABS
12.5000 mg | ORAL_TABLET | Freq: Two times a day (BID) | ORAL | Status: DC
  Administered 2018-05-25: 12.5 mg via ORAL
  Filled 2018-05-22: qty 1

## 2018-05-22 NOTE — Consult Note (Signed)
NAME:  Bradley Johns, MRN:  [id number], DOB:  1934-11-24, LOS: 1 ADMISSION DATE:  05/20/2018, CONSULTATION DATE:  05/22/2018 REFERRING MD:  [place] Crane - [facility], CHIEF COMPLAINT:  Dyspnea   HPI/course in hospital  84 year old admitted for LGIB. Received PRBC .   Known COPD previously followed by Dr Elsworth Soho. Now seen by PCP alone. On 4Lpm.  More dyspneic on exertion today prior to discharge.  No longer taking puffers because wasn't helping in the past.  Currently followed at [facility] for pulmonary fibrosis.  History of OSA - Previously on CPAP - now on O2 4lpm only.  Past Medical History   Past Medical History:  Diagnosis Date  . Arthritis   . Benign prostatic hypertrophy   . COPD (chronic obstructive pulmonary disease) ([place])    24/7 O2  . Diabetes mellitus    per Dr Posey Pronto  . Epidermal cyst of face    left lower lateral cheek  . Gout   . Hyperlipidemia   . Hypertension   . Pulmonary fibrosis ([place]) 2014  . Sleep apnea      Past Surgical History:  Procedure Laterality Date  . COLONOSCOPY WITH PROPOFOL N/A 05/18/2018   Procedure: COLONOSCOPY WITH PROPOFOL;  Surgeon: Carol Ada, MD;  Location: [facility] ENDOSCOPY;  Service: Endoscopy;  Laterality: N/A;  . POLYPECTOMY  05/18/2018   Procedure: POLYPECTOMY;  Surgeon: Carol Ada, MD;  Location: [facility] ENDOSCOPY;  Service: Endoscopy;;  . TOE SURGERY       Review of Systems:   ROS  Social History   reports that he quit smoking about 22 years ago. His smoking use included cigarettes. He has a 30.00 pack-year smoking history. He has never used smokeless tobacco. He reports current alcohol use. He reports that he does not use drugs.   Family History   His family history includes Cancer in his brother; Coronary artery disease (age of onset: 76) in his father; Diabetes in his sister; Liver disease in his mother. There is no history of Hypertension, Hyperlipidemia, Heart attack, Prostate cancer, or Colon cancer.   Allergies No Known Allergies   Home  Medications  Prior to Admission medications   Medication Sig Start Date End Date Taking? Authorizing Provider  amLODipine (NORVASC) 10 MG tablet Take 1 tablet (10 mg total) by mouth daily. 04/15/17  Yes Colon Branch, MD  aspirin 81 MG tablet Take 81 mg by mouth daily.     Yes [provider]  atorvastatin (LIPITOR) 80 MG tablet Take 1 tablet (80 mg total) by mouth daily. Patient taking differently: Take 80 mg by mouth every evening.  04/15/17  Yes Colon Branch, MD  carvedilol (COREG) 12.5 MG tablet Take 1 tablet (12.5 mg total) by mouth 2 (two) times daily with a meal. 06/12/17  Yes Paz, Alda Berthold, MD  colchicine 0.6 MG tablet Take 1 tablet (0.6 mg total) by mouth 2 (two) times daily as needed. Patient taking differently: Take 0.6 mg by mouth 2 (two) times daily as needed (gout flair).  12/18/17  Yes Paz, Alda Berthold, MD  doxazosin (CARDURA) 4 MG tablet Take 1 tablet (4 mg total) by mouth at bedtime. 04/27/18  Yes Paz, Alda Berthold, MD  HYDROcodone-acetaminophen (NORCO/VICODIN) 5-325 MG tablet Take 1 tablet by mouth every 8 (eight) hours as needed. Patient taking differently: Take 1 tablet by mouth every 8 (eight) hours as needed for moderate pain.  12/09/17  Yes Paz, Alda Berthold, MD  ketoconazole (NIZORAL) 2 % cream Apply 1 application  topically daily. Patient taking differently: Apply 1 application topically daily as needed (moisture around groin).  03/24/18  Yes Paz, Alda Berthold, MD  losartan (COZAAR) 50 MG tablet Take 1 tablet (50 mg total) by mouth daily. 06/12/17  Yes Paz, Alda Berthold, MD  metFORMIN (GLUCOPHAGE) 500 MG tablet Take 500 mg by mouth 2 (two) times daily with a meal.     Yes [provider]  Multiple Vitamin (MULTIVITAMIN) tablet Take 1 tablet by mouth daily.     Yes [provider]  pioglitazone (ACTOS) 45 MG tablet Take 45 mg by mouth daily.   Yes [provider]  ACCU-CHEK AVIVA PLUS test strip Reported on 06/20/2015 12/13/14   [provider]  ACCU-CHEK SOFTCLIX LANCETS  lancets Reported on 06/20/2015 12/13/14   [provider]  B-D UF III MINI PEN NEEDLES 31G X 5 MM MISC Reported on 06/20/2015 11/07/14   [provider]  Blood Glucose Monitoring Suppl (ACCU-CHEK AVIVA PLUS) W/DEVICE KIT Reported on 06/20/2015 10/27/14   [provider]  OXYGEN Inhale into the lungs. 3L Nocturnal and PRN for Hypoxemia    [provider]     Interim history/subjective:  Breathing better with diuresis.  Objective   Blood pressure (!) 142/51, pulse (!) 57, temperature 97.7 F (36.5 C), temperature source Oral, resp. rate (!) 8, height '5\' 8"'$  (1.727 m), weight 90.6 kg, SpO2 99 %.        Intake/Output Summary (Last 24 hours) at 05/22/2018 1146 Last data filed at 05/22/2018 1100 Gross per 24 hour  Intake 403.5 ml  Output 2000 ml  Net -1596.5 ml   Filed Weights   05/20/18 0025 05/20/18 0443 05/21/18 0442  Weight: 92.5 kg 90.3 kg 90.6 kg    Examination: Physical Exam  Vitals reviewed. Constitutional: He is oriented to person, place, and time. He appears well-developed and well-nourished.  HENT:  Head: Normocephalic and atraumatic.  Eyes: Conjunctivae are normal.  Neck: Neck supple. No JVD present. No tracheal deviation present.  Cardiovascular: Normal rate and regular rhythm.  Respiratory: Effort normal and breath sounds normal. Stridor present. He has no wheezes. He has no rales.  No tracheal tug.  GI: Soft. There is no abdominal tenderness.  Neurological: He is alert and oriented to person, place, and time. GCS eye subscore is 4. GCS verbal subscore is 5. GCS motor subscore is 6.  Skin: Skin is warm and dry.     Ancillary tests (personally reviewed)  CBC: Recent Labs  Lab 05/16/18 2238  05/18/18 1513 05/20/18 0112 05/20/18 0624 05/20/18 1920 05/21/18 0252 05/21/18 1055 05/22/18 0524  WBC 8.2   < > 5.7 8.4 5.7  --  6.0  --  6.0  NEUTROABS 5.7  --   --   --   --   --  3.2  --   --   HGB 12.0*   < > 11.0* 9.5* 7.9* 7.4* 7.4*   7.6* 7.7* 10.6*  HCT 39.5   < > 36.5* 30.8* 25.9* 24.4* 25.1*  26.1* 25.6* 34.5*  MCV 100.8*   < > 100.6* 100.7* 103.2*  --  104.4*  --  98.6  PLT 163   < > 177 177 127*  --  143*  --  155   < > = values in this interval not displayed.    Basic Metabolic Panel: Recent Labs  Lab 05/18/18 0702 05/20/18 0112 05/20/18 0624 05/21/18 0252 05/22/18 0524  NA 138 140 141 140 140  K 4.3 4.3  4.0 4.1 3.9  CL 104 107 111 110 105  CO2 '26 27 27 24 28  '$ GLUCOSE 106* 141* 124* 90 87  BUN '12 20 20 12 11  '$ CREATININE 1.05 1.76* 1.42* 1.04 0.86  CALCIUM 8.2* 8.3* 7.7* 7.7* 8.4*  MG  --   --   --   --  1.6*   GFR: Estimated Creatinine Clearance: 73.7 mL/min (by C-G formula based on SCr of 0.86 mg/dL). Recent Labs  Lab 05/20/18 0112 05/20/18 0624 05/21/18 0252 05/22/18 0524  WBC 8.4 5.7 6.0 6.0    Liver Function Tests: Recent Labs  Lab 05/18/18 0702 05/20/18 0112 05/22/18 0524  AST 55* 72* 40  ALT '17 27 26  '$ ALKPHOS 47 45 45  BILITOT 1.4* 0.8 1.2  PROT 6.2* 6.3* 5.9*  ALBUMIN 3.5 3.5 3.3*   No results for input(s): LIPASE, AMYLASE in the last 168 hours. No results for input(s): AMMONIA in the last 168 hours.  ABG No results found for: PHART, PCO2ART, PO2ART, HCO3, TCO2, ACIDBASEDEF, O2SAT   Coagulation Profile: No results for input(s): INR, PROTIME in the last 168 hours.  Cardiac Enzymes: No results for input(s): CKTOTAL, CKMB, CKMBINDEX, TROPONINI in the last 168 hours.  HbA1C: Hemoglobin A1C  Date/Time Value Ref Range Status  06/24/2017 6.3  Final   Hgb A1c MFr Bld  Date/Time Value Ref Range Status  05/21/2018 02:52 AM 6.9 (H) 4.8 - 5.6 % Final    Comment:    (NOTE) Pre diabetes:          5.7%-6.4% Diabetes:              >6.4% Glycemic control for   <7.0% adults with diabetes   10/06/2006 08:38 AM 15.0 (H) 4.6 - 6.0 % Final    Comment:    See lab report for associated comment(s)    CBG: Recent Labs  Lab 05/21/18 1224 05/21/18 1633 05/21/18 2153  05/22/18 0741 05/22/18 1108  GLUCAP 159* 112* 140* 89 145*   CXR and PFT: consistent with restriction and IPF more so than COPD.  Assessment & Plan:  Increased dyspnea and hypoxia due to volume overload from transfusion. Good response to diuresis  Baseline chronic respiratory failure on home oxygen. Followed by PCP and [facility] Pulmonary  Recommendations:  Progressive ambulation and discharge once dyspnea back to baseline. Follow up with PCP  Do not restart bronchodilators as limited symptomatic improvement in the past - will leave restarting them to discretion of PCP.   Kipp Brood, MD [facility] ICU Physician [place]  Pager: [phone] Mobile: [phone] After hours: [phone].  05/22/2018, 11:46 AM

## 2018-05-22 NOTE — Progress Notes (Addendum)
PROGRESS NOTE  Bradley Johns YYQ:[id number] DOB: 28-Apr-1934 DOA: 05/20/2018 PCP: Bradley Branch, MD  HPI/Recap of past 24 hours: Bradley Johns is a 84 y.o. male with medical history significant of pulmonary fibrosis and COPD on 3 to 4 L of oxygen chronically at home per nasal cannula, hypertension just discharged on February 11 for lower GI bleed had a colonoscopy done by Dr. Benson Johns showing diverticulosis without any evidence of active bleeding had a polyp that was removed.  Patient reports his bleeding seemed to have stopped prior to his discharge she was sent home and his aspirin was resumed.  He was at home 24 hours when he started having bright red blood per rectum again that was darker in nature but not melanotic.  He denies any nausea vomiting or any abdominal pain.  He denies any fevers.  His hemoglobin has dropped from 12 on that admission down to 9 now.  He is starting to get dizzy and weak.  Patient is being referred for admission for lower GI bleed.  Hospital course complicated by hypoxia with mobilization.  Patient desaturated in the 70s on 4 L of oxygen via nasal cannula.  Reviewed home medication and patient not taking COPD targeted medications.  States he follows with Dr. Elsworth Johns which she has not seen in years.    Consulted pulmonology to assist in management of his advanced COPD.  Received 2 unit PRBCs last night.  Gentle diuresis to improve his respiration status.  Will hold antihypertensive medication to allow room for further diuresing.  05/22/2018: Patient seen and examined at his bedside.  Just finished working with physical therapy.  Reports dyspnea with minimal exertion on 4 L of oxygen by nasal cannula.  Discussed with pulmonology who will see the patient.    Assessment/Plan: Principal Problem:   Acute GI bleeding Active Problems:   Essential hypertension   COPD with emphysema, on O2   Pulmonary fibrosis (HCC)   GIB (gastrointestinal bleeding)   Anemia associated with  acute blood loss  Acutepresumed lowerGI bleed/diverticular bleed Presented with hematochezia with drop of hemoglobin. Down to 7.4 with baseline Hg 12. Suspected diverticular bleed which is unpredictable.  GI consulted and followed. Negative nuclear red tagged blood scan  Positive FOBT GI following Post 2 unit PRBC transfusion on 214, Hemoglobin is stable at 10.2 No recurrent GI bleed this morning  Acute on chronic hypoxic respiratory failure in the setting of advanced COPD on 4 L at baseline Desaturates in the 70s on 4 L of oxygen by nasal cannula Chest x-ray done today 05/22/2018 independent reviewed revealed increase in pulmonary vascularity IV Lasix given Hold off antihypertensives to allow room for further diuresing Monitor O2 saturation Currently not taking COPD specific medications Pulmonology consulted to assist in the management of his advanced COPD  Acute blood losssuspect secondary to presumed diverticular bleed No recurrences of GI bleed this morning Post 2 unit PRBC transfusion Hemoglobin is now stable at 10  Mild pulmonary edema from recent blood transfusion and IV fluid hydration Given IV Lasix Diurese as tolerated Monitor urine output Monitor renal function and electrolytes Obtain BMP and magnesium in the morning  AKI, resolved Presented with cr 1.76 Creatinine is back to baseline 0.86 GFR greater than 60 Continue to avoid nephrotoxic agents Monitor urine output  Hypomagnesemia Magnesium 1.6 Repleted with 4 g IV magnesium once  Essential hypertension- Hold off antihypertensive medication to allow room for diuresing Continue vital signs monitoring   Type 2 diabetes with hyperglycemia,  improving Hemoglobin A1c 6.9 on 05/21/2018 Continue insulin sliding scale sensitive  Uncontrolled COPD with emphysema Appears to be noncompliant with his management Follows with pulmonology Dr. Dagmar Johns which she has not seen in years Pulmonology consulted to  further assist in the management of his advanced COPD  Pulmonary fibrosis (HCC)-stable continue home oxygen  OSA Recommend CPAP at night  Risks: High risk for decompensation due to recent significant lower GI bleed, severe acute on chronic hypoxic respiratory failure, multiple comorbidities and advanced age.  Patient will require at least 2 midnights for further evaluation and treatment of present condition.     Code Status:Full  DVT prophylaxis: SCDs Code Status: Full Family Communication:  None at bedside Disposition Plan:  Possible discharge to home in 1 to 2 days when oxygen requirement is close to baseline or when pulmonology and GI sign off. Consults called:  GI Dr. Benson Johns, pulmonology      Objective: Vitals:   05/22/18 0500 05/22/18 0600 05/22/18 0700 05/22/18 0740  BP: (!) 148/57 (!) 128/46 (!) 137/41   Pulse: (!) 58 (!) 53 (!) 59   Resp: 16 15 12    Temp:    98 F (36.7 C)  TempSrc:    Oral  SpO2: 98% 97% 96%   Weight:      Height:        Intake/Output Summary (Last 24 hours) at 05/22/2018 0947 Last data filed at 05/22/2018 0740 Gross per 24 hour  Intake 403.5 ml  Output 1225 ml  Net -821.5 ml   Filed Weights   05/20/18 0025 05/20/18 0443 05/21/18 0442  Weight: 92.5 kg 90.3 kg 90.6 kg    Exam:  . General: 84 y.o. year-old male well-developed well-nourished, appears mildly uncomfortable due to dyspnea with minimal exertion.  Alert and oriented x3. . Cardiovascular: Regular rate and rhythm without rubs or gallops.  No JVD or thyromegaly . Respiratory: Mild rales at bases with no wheezes.  Poor inspiratory effort. . Abdomen: Soft nontender nondistended with normal bowel sounds x4 quadrants. . Musculoskeletal: No lower extremity edema. 2/4 pulses in all 4 extremities. Marland Kitchen Psychiatry: Mood is appropriate for condition and setting   Data Reviewed: CBC: Recent Labs  Lab 05/16/18 2238  05/18/18 1513 05/20/18 0112 05/20/18 0624 05/20/18 1920  05/21/18 0252 05/21/18 1055 05/22/18 0524  WBC 8.2   < > 5.7 8.4 5.7  --  6.0  --  6.0  NEUTROABS 5.7  --   --   --   --   --  3.2  --   --   HGB 12.0*   < > 11.0* 9.5* 7.9* 7.4* 7.4*  7.6* 7.7* 10.6*  HCT 39.5   < > 36.5* 30.8* 25.9* 24.4* 25.1*  26.1* 25.6* 34.5*  MCV 100.8*   < > 100.6* 100.7* 103.2*  --  104.4*  --  98.6  PLT 163   < > 177 177 127*  --  143*  --  155   < > = values in this interval not displayed.   Basic Metabolic Panel: Recent Labs  Lab 05/18/18 0702 05/20/18 0112 05/20/18 0624 05/21/18 0252 05/22/18 0524  NA 138 140 141 140 140  K 4.3 4.3 4.0 4.1 3.9  CL 104 107 111 110 105  CO2 26 27 27 24 28   GLUCOSE 106* 141* 124* 90 87  BUN 12 20 20 12 11   CREATININE 1.05 1.76* 1.42* 1.04 0.86  CALCIUM 8.2* 8.3* 7.7* 7.7* 8.4*  MG  --   --   --   --  1.6*   GFR: Estimated Creatinine Clearance: 73.7 mL/min (by C-G formula based on SCr of 0.86 mg/dL). Liver Function Tests: Recent Labs  Lab 05/18/18 0702 05/20/18 0112 05/22/18 0524  AST 55* 72* 40  ALT 17 27 26   ALKPHOS 47 45 45  BILITOT 1.4* 0.8 1.2  PROT 6.2* 6.3* 5.9*  ALBUMIN 3.5 3.5 3.3*   No results for input(s): LIPASE, AMYLASE in the last 168 hours. No results for input(s): AMMONIA in the last 168 hours. Coagulation Profile: No results for input(s): INR, PROTIME in the last 168 hours. Cardiac Enzymes: No results for input(s): CKTOTAL, CKMB, CKMBINDEX, TROPONINI in the last 168 hours. BNP (last 3 results) No results for input(s): PROBNP in the last 8760 hours. HbA1C: Recent Labs    05/21/18 0252  HGBA1C 6.9*   CBG: Recent Labs  Lab 05/21/18 0822 05/21/18 1224 05/21/18 1633 05/21/18 2153 05/22/18 0741  GLUCAP 91 159* 112* 140* 89   Lipid Profile: No results for input(s): CHOL, HDL, LDLCALC, TRIG, CHOLHDL, LDLDIRECT in the last 72 hours. Thyroid Function Tests: No results for input(s): TSH, T4TOTAL, FREET4, T3FREE, THYROIDAB in the last 72 hours. Anemia Panel: No results for  input(s): VITAMINB12, FOLATE, FERRITIN, TIBC, IRON, RETICCTPCT in the last 72 hours. Urine analysis:    Component Value Date/Time   COLORURINE YELLOW 10/16/2014 [place] 10/16/2014 1203   LABSPEC 1.020 10/16/2014 1203   PHURINE 6.0 10/16/2014 1203   GLUCOSEU NEGATIVE 10/16/2014 1203   HGBUR NEGATIVE 10/16/2014 1203   HGBUR negative 10/06/2006 0756   BILIRUBINUR negative 09/09/2017 1217   KETONESUR NEGATIVE 10/16/2014 1203   PROTEINUR Positive (A) 09/09/2017 1217   UROBILINOGEN 0.2 09/09/2017 1217   UROBILINOGEN 1.0 10/16/2014 1203   NITRITE negative 09/09/2017 1217   NITRITE NEGATIVE 10/16/2014 1203   LEUKOCYTESUR Negative 09/09/2017 1217   Sepsis Labs: @LABRCNTIP (procalcitonin:4,lacticidven:4)  ) Recent Results (from the past 240 hour(s))  MRSA PCR Screening     Status: None   Collection Time: 05/20/18  4:40 AM  Result Value Ref Range Status   MRSA by PCR NEGATIVE NEGATIVE Final    Comment:        The GeneXpert MRSA Assay (FDA approved for NASAL specimens only), is one component of a comprehensive MRSA colonization surveillance program. It is not intended to diagnose MRSA infection nor to guide or monitor treatment for MRSA infections. Performed at [facility], [place] [address] [place], [place] [address]       Studies: No results found.  Scheduled Meds: . sodium chloride   Intravenous Once  . amLODipine  10 mg Oral Daily  . carvedilol  12.5 mg Oral BID  . doxazosin  4 mg Oral QHS  . insulin aspart  0-5 Units Subcutaneous QHS  . insulin aspart  0-9 Units Subcutaneous TID WC  . losartan  50 mg Oral Daily  . mouth rinse  15 mL Mouth Rinse BID    Continuous Infusions: . magnesium sulfate 1 - 4 g bolus IVPB 4 g (05/22/18 0940)     LOS: 1 day     Kayleen Memos, MD [facility] Pager [phone]  If 7PM-7AM, please contact night-coverage [email or web address] Password [facility] 05/22/2018, 9:47 AM

## 2018-05-22 NOTE — Evaluation (Signed)
Physical Therapy Evaluation Patient Details Name: Bradley Johns MRN: [id number] DOB: March 20, 1935 Today's Date: 05/22/2018   History of Present Illness  84 yo male admittred to  for bloody stool, recurrent. patient on home oxygen .  Clinical Impression  The patient ambulated x 100' on 4 liters [place]. Saturation dropping to 78%. MD aware. Pt admitted with above diagnosis. Pt currently with functional limitations due to the deficits listed below (see PT Problem List).  Pt will benefit from skilled PT to increase their independence and safety with mobility to allow discharge to the venue listed below.   Patient's bed and bath on second level.   At rest, saturation 88-92% on 4 liters.    Follow Up Recommendations Home health PT    Equipment Recommendations  None recommended by PT    Recommendations for Other Services       Precautions / Restrictions Precautions Precaution Comments: monitor sats      Mobility  Bed Mobility Overal bed mobility: Needs Assistance Bed Mobility: Sit to Supine;Supine to Sit     Supine to sit: Supervision;HOB elevated Sit to supine: Supervision      Transfers Overall transfer level: Needs assistance   Transfers: Sit to/from Stand Sit to Stand: Min guard         General transfer comment: no external support  Ambulation/Gait Ambulation/Gait assistance: Min guard;Min assist Gait Distance (Feet): 100 Feet Assistive device: 1 person hand held assist Gait Pattern/deviations: Step-through pattern Gait velocity: decr   General Gait Details: gait is steady but requires slight support. Sats dropping to 78% on 4 liters   Stairs            Wheelchair Mobility    Modified Rankin (Stroke Patients Only)       Balance Overall balance assessment: Needs assistance   Sitting balance-Leahy Scale: Normal     Standing balance support: No upper extremity supported Standing balance-Leahy Scale: Fair                                Pertinent Vitals/Pain Pain Assessment: No/denies pain    Home Living Family/patient expects to be discharged to:: Private residence Living Arrangements: Spouse/significant other Available Help at Discharge: Family Type of Home: House Home Access: Stairs to enter   Technical brewer of Steps: 1 Home Layout: Two level;Bed/bath upstairs Home Equipment: Cane - single point      Prior Function Level of Independence: Independent               Hand Dominance        Extremity/Trunk Assessment   Upper Extremity Assessment Upper Extremity Assessment: Generalized weakness    Lower Extremity Assessment Lower Extremity Assessment: Generalized weakness    Cervical / Trunk Assessment Cervical / Trunk Assessment: Normal  Communication   Communication: No difficulties  Cognition Arousal/Alertness: Awake/alert Behavior During Therapy: WFL for tasks assessed/performed Overall Cognitive Status: Within Functional Limits for tasks assessed                                        General Comments      Exercises     Assessment/Plan    PT Assessment Patient needs continued PT services  PT Problem List Decreased activity tolerance;Decreased mobility;Decreased knowledge of precautions;Cardiopulmonary status limiting activity       PT Treatment Interventions DME instruction;Gait training;Stair  training;Functional mobility training;Therapeutic activities;Patient/family education    PT Goals (Current goals can be found in the Care Plan section)  Acute Rehab PT Goals Patient Stated Goal: to go home PT Goal Formulation: With patient Time For Goal Achievement: 05/29/18 Potential to Achieve Goals: Good    Frequency Min 3X/week   Barriers to discharge        Co-evaluation               AM-PAC PT "6 Clicks" Mobility  Outcome Measure Help needed turning from your back to your side while in a flat bed without using bedrails?: A Little Help  needed moving from lying on your back to sitting on the side of a flat bed without using bedrails?: A Little Help needed moving to and from a bed to a chair (including a wheelchair)?: A Little Help needed standing up from a chair using your arms (e.g., wheelchair or bedside chair)?: A Lot Help needed to walk in hospital room?: A Lot Help needed climbing 3-5 steps with a railing? : Total 6 Click Score: 14    End of Session Equipment Utilized During Treatment: Gait belt Activity Tolerance: Treatment limited secondary to medical complications (Comment)(SOB) Patient left: in bed;with call bell/phone within reach;with bed alarm set Nurse Communication: Mobility status(satsn dropping) PT Visit Diagnosis: Difficulty in walking, not elsewhere classified (R26.2)    Time: 9485-4627 PT Time Calculation (min) (ACUTE ONLY): 37 min   Charges:   PT Evaluation $PT Eval Low Complexity: 1 Low PT Treatments $Gait Training: 8-22 mins        [facility] Pager [phone] Office [phone]   Claretha Cooper 05/22/2018, 2:29 PM

## 2018-05-23 DIAGNOSIS — K922 Gastrointestinal hemorrhage, unspecified: Secondary | ICD-10-CM | POA: Diagnosis not present

## 2018-05-23 LAB — GLUCOSE, CAPILLARY
Glucose-Capillary: 95 mg/dL (ref 70–99)
Glucose-Capillary: 150 mg/dL — ABNORMAL HIGH (ref 70–99)
Glucose-Capillary: 92 mg/dL (ref 70–99)
Glucose-Capillary: 184 mg/dL — ABNORMAL HIGH (ref 70–99)
Glucose-Capillary: 139 mg/dL — ABNORMAL HIGH (ref 70–99)

## 2018-05-23 LAB — HEMOGLOBIN
Hemoglobin: 11.4 g/dL — ABNORMAL LOW (ref 13.0–17.0)
Hemoglobin: 11.3 g/dL — ABNORMAL LOW (ref 13.0–17.0)

## 2018-05-23 LAB — BASIC METABOLIC PANEL
Anion gap: 7 (ref 5–15)
BUN: 14 mg/dL (ref 8–23)
CO2: 33 mmol/L — ABNORMAL HIGH (ref 22–32)
Calcium: 8.3 mg/dL — ABNORMAL LOW (ref 8.9–10.3)
Chloride: 101 mmol/L (ref 98–111)
Creatinine, Ser: 1.03 mg/dL (ref 0.61–1.24)
GFR calc Af Amer: >60 mL/min (ref >60)
GFR calc non Af Amer: >60 mL/min (ref >60)
Glucose, Bld: 87 mg/dL (ref 70–99)
Potassium: 3.6 mmol/L (ref 3.5–5.1)
Sodium: 141 mmol/L (ref 135–145)

## 2018-05-23 LAB — CBC
HCT: 34.3 % — ABNORMAL LOW (ref 39.0–52.0)
Hemoglobin: 10.6 g/dL — ABNORMAL LOW (ref 13.0–17.0)
MCH: 30 pg (ref 26.0–34.0)
MCHC: 30.9 g/dL (ref 30.0–36.0)
MCV: 97.2 fL (ref 80.0–100.0)
Platelets: 174 10*3/uL (ref 150–400)
RBC: 3.53 MIL/uL — ABNORMAL LOW (ref 4.22–5.81)
RDW: 15.7 % — ABNORMAL HIGH (ref 11.5–15.5)
WBC: 6.1 10*3/uL (ref 4.0–10.5)
nRBC: 0 % (ref 0.0–0.2)

## 2018-05-23 LAB — MAGNESIUM: Magnesium: 1.7 mg/dL (ref 1.7–2.4)

## 2018-05-23 MED ORDER — BISACODYL 10 MG RE SUPP
10.0000 mg | Freq: Once | RECTAL | Status: AC
  Administered 2018-05-23: 10 mg via RECTAL
  Filled 2018-05-23: qty 1

## 2018-05-23 MED ORDER — SENNOSIDES-DOCUSATE SODIUM 8.6-50 MG PO TABS: 2.0000 | ORAL_TABLET | Freq: Two times a day (BID) | ORAL | 0 refills | Status: DC

## 2018-05-23 MED ORDER — AMLODIPINE BESYLATE 10 MG PO TABS
10.0000 mg | ORAL_TABLET | Freq: Every day | ORAL | Status: DC
  Administered 2018-05-23 – 2018-05-25 (×3): 10 mg via ORAL
  Filled 2018-05-23 (×3): qty 1

## 2018-05-23 MED ORDER — POLYETHYLENE GLYCOL 3350 17 G PO PACK: 17.0000 g | PACK | Freq: Every day | ORAL | 0 refills | Status: DC

## 2018-05-23 MED ORDER — SENNOSIDES-DOCUSATE SODIUM 8.6-50 MG PO TABS
2.0000 | ORAL_TABLET | Freq: Two times a day (BID) | ORAL | Status: DC
  Administered 2018-05-23 – 2018-05-25 (×4): 2 via ORAL
  Filled 2018-05-23 (×4): qty 2

## 2018-05-23 MED ORDER — MAGNESIUM SULFATE 4 GM/100ML IV SOLN
4.0000 g | Freq: Once | INTRAVENOUS | Status: AC
  Administered 2018-05-23: 4 g via INTRAVENOUS
  Filled 2018-05-23: qty 100

## 2018-05-23 MED ORDER — POLYETHYLENE GLYCOL 3350 17 G PO PACK
17.0000 g | PACK | Freq: Every day | ORAL | Status: DC
  Administered 2018-05-23 – 2018-05-25 (×3): 17 g via ORAL
  Filled 2018-05-23 (×2): qty 1

## 2018-05-23 NOTE — Progress Notes (Signed)
Progress Note Weekend coverage for Dr. Benson Norway  Subjective  Chief Complaint: Acute GI bleed  Nursing called and explained the patient had a bowel movement today with some bright red blood when wiping.  Patient had otherwise been ready for discharge.  At time of interview patient tells me he is actually had 2 bowel movements now both with some bright red blood.  They did give him to Senokot x2, Dulcolax suppository and some MiraLAX this morning as he had not had a bowel movement since time of colonoscopy 05/18/2018.  He denies any abdominal discomfort.   Objective   Vital signs in last 24 hours: Temp:  [98.1 F (36.7 C)-99.3 F (37.4 C)] 99.2 F (37.3 C) (02/16 1231) Pulse Rate:  [52-81] 71 (02/16 0800) Resp:  [11-29] 22 (02/16 0800) BP: (121-189)/(45-93) 121/93 (02/16 0800) SpO2:  [83 %-98 %] 96 % (02/16 0800) Last BM Date: 05/20/18 General:    AA male in NAD Heart:  Regular rate and rhythm; no murmurs Lungs: Respirations even and unlabored, lungs CTA bilaterally Abdomen:  Soft, nontender and nondistended. Normal bowel sounds. Extremities:  Without edema. Neurologic:  Alert and oriented,  grossly normal neurologically. Psych:  Cooperative. Normal mood and affect.  Intake/Output from previous day: 02/15 0701 - 02/16 0700 In: 557.1 [P.O.:460; IV Piggyback:97.1] Out: 2400 [Urine:2400]  Lab Results: Recent Labs    05/21/18 0252 05/21/18 1055 05/22/18 0524 05/23/18 0352  WBC 6.0  --  6.0 6.1  HGB 7.4*  7.6* 7.7* 10.6* 10.6*  HCT 25.1*  26.1* 25.6* 34.5* 34.3*  PLT 143*  --  155 174   BMET Recent Labs    05/21/18 0252 05/22/18 0524 05/23/18 0352  NA 140 140 141  K 4.1 3.9 3.6  CL 110 105 101  CO2 24 28 33*  GLUCOSE 90 87 87  BUN 12 11 14   CREATININE 1.04 0.86 1.03  CALCIUM 7.7* 8.4* 8.3*   LFT Recent Labs    05/22/18 0524  PROT 5.9*  ALBUMIN 3.3*  AST 40  ALT 26  ALKPHOS 45  BILITOT 1.2   Studies/Results: Dg Chest Port 1 View  Result Date:  05/22/2018 CLINICAL DATA:  Shortness of breath EXAM: PORTABLE CHEST 1 VIEW COMPARISON:  November 19, 2016 FINDINGS: There is patchy airspace consolidation throughout portions of the left mid lower lung zones. There is also patchy infiltrate in the right base. Heart is mildly enlarged with pulmonary vascularity normal. No adenopathy. There is aortic atherosclerosis. No bone lesions. IMPRESSION: Patchy airspace opacity, most likely pneumonia in the left mid lower lung zones as well as to a lesser extent in the right base. Mild cardiac prominence is stable. Aortic Atherosclerosis (ICD10-I70.0). Electronically Signed   By: Lowella Grip III M.D.   On: 05/22/2018 09:53    Assessment / Plan:   Assessment: 1.  Diverticular bleed: Underwent colonoscopy 05/18/2018, bleeding thought diverticular, it had stopped on its own, this is patient's first bowel movement since time of procedure with the help of multiple laxatives and a suppository, now 2 episodes of bright red blood with solid stool ; likely continue diverticular bleed 2.  Anemia: Hemoglobin 10.6 earlier this morning, repeat today 11 point 2:04 episodes of bloody bowel movements  Plan: 1.  Ordered repeat hemoglobin which is actually gone up since time of last check this morning, recommend continue to check again tonight and tomorrow morning, with transfusion as needed less than 7 2.  Discussed with patient he will need to remain in the  hospital today.  He can likely be transferred to a stepdown unit given that his other problems are resolved. 3.  Patient will be on clear liquid diet today, can resume regular diet if no further bleeding in the morning, will leave to Dr. Benson Norway. 4.  Please await any further recommendations from Dr. Havery Moros later today.   LOS: 1 day   Levin Erp  05/23/2018, 1:30 PM

## 2018-05-23 NOTE — Progress Notes (Signed)
Pt had orders to DC home. Orders held due to report of 2 bloody BMs per RN. RN reported stools formed with dark blood on outside of stool and BRB on tissue present upon wiping rectum. MD wanted GI to come evaluate pt before decisions were made regarding pt. GI evaluated pt and ordered repeat Hgb and stated that pt was stable from their standpoint  for transfer out of SDU. Repeat hgb resulted at 11.4 up from 10.6 this AM. RN reached out to MD about transferring pt to telemetry. MD reported that she would like pt to remain in SDU due to soft blood pressures and instability of pt and likelihood of re-bleed. RN clarified with MD that BPs had been running between 130s-160s over last 24 hrs with lowest BP being 121/93.  MD restated that she would like patient to remain in SDU due to soft BPs and chance of bleeding due to the bleed being diverticular in nature. Pt remains in SDU per MD request. Will continue to monitor.

## 2018-05-23 NOTE — Discharge Summary (Addendum)
Discharge Summary  Bradley Johns VHQ:[id number] DOB: Aug 23, 1934  PCP: Colon Branch, MD  Admit date: 05/20/2018 Discharge date: 05/23/2018  Time spent: 35 minutes  DISCHARGE DELAYED DUE TO RECURRENT LOWER GI BLEED. GI MADE AWARE BY RN VERA.  Recommendations for Outpatient Follow-up:  1. Follow-up with your PCP on Monday, 05/24/2018 and obtain pulmonology referral from PCP 2. Follow-up with pulmonology 3. Take your medications as prescribed 4. Continue physical therapy 5. Fall precautions  Discharge Diagnoses:  Active Hospital Problems   Diagnosis Date Noted  . Acute GI bleeding 05/17/2018  . GIB (gastrointestinal bleeding) 05/20/2018  . Anemia associated with acute blood loss 05/20/2018  . Pulmonary fibrosis ([place]) 12/14/2012  . COPD with emphysema, on O2 04/29/2006  . Essential hypertension 04/29/2006    Resolved Hospital Problems  No resolved problems to display.    Diet recommendation: Resume previous diet heart healthy carb modified diet  Vitals:   05/23/18 0749 05/23/18 0800  BP:  (!) 121/93  Pulse:  71  Resp:  (!) 22  Temp: 99.3 F (37.4 C)   SpO2:  96%    History of present illness:  CABLE FEARN a 84 y.o.malewith past medical history significant forpulmonary fibrosis and COPD on 4 L of oxygen chronically at home per nasal cannula, hypertension just discharged on February 11 for lower GI bleed had a colonoscopy done by Dr. Benson Norway showing diverticulosis without any evidence of active bleeding had a polyp that was removed. Patient reports his bleeding seemed to have stopped prior to his discharge she was sent home and his aspirin was resumed. He was at home 24 hours when he started having bright red blood per rectum again that was darker in nature but not melanotic. He denies any nausea vomiting or any abdominal pain. He denies any fevers. His hemoglobin has dropped from 12 on that admission down to 9 now. He is starting to get dizzy and weak. Patient is  being referred for admission for lower GI bleed.  Hospital course complicated by hypoxia with mobilization.  Patient desaturated in the 70s on 4 L of oxygen via nasal cannula.  Reviewed home medication and patient not taking COPD targeted medications.  States he follows with Dr. Elsworth Soho which she has not seen in years.    Consulted pulmonology to assist in management of his advanced COPD.  Received 2 unit PRBCs last night.  Gentle diuresis to improve his respiration status.  Will hold antihypertensive medication to allow room for further diuresing.  05/22/2018: Reports dyspnea with minimal exertion on 4 L of oxygen by nasal cannula.  Discussed with pulmonology who will see the patient.  05/23/18: This morning seen and examined with no complaints. when close to dc RN noted recurrence of lower GI bleed x 2. Will continue to closely monitor.   Update: Repeat H&H. Hg 11.1 higher than prior. Suspect hemoconcentration from recent diuresis.  High risk for decompensation in the setting of suspected diverticular bleed which is unpredictable, advanced COPD with chronic hypoxia requiring at least 4L to maintain O2 sat above 90%, multiple comorbidities, and advanced age.   Close monitoring of hemodynamics is necessary.    Hospital Course:  Principal Problem:   Acute GI bleeding Active Problems:   Essential hypertension   COPD with emphysema, on O2   Pulmonary fibrosis (HCC)   GIB (gastrointestinal bleeding)   Anemia associated with acute blood loss  Resolved lowerGI bleed/diverticular bleed Presented with hematocheziawith drop of hemoglobin.Down to 7.4with baseline Hg 12. Suspecteddiverticular  bleed  Had a recent colonoscopy done by Dr. Benson Norway which showed diverticulosis without any evidence of active bleeding Negative nuclear red tagged blood scan  Positive FOBT Post 2 unit PRBC transfusion on 05/21/18 Recurrent GI bleed during this admission 05/23/18. Repeat H&H this afternoon: Hg 11.1 from  10.3 this am, suspect number is higher due to hemoconcentratioin from recent diuresis. Net -1.8L since yesterday. DC lasix. Maintain MAP>65.  Acute on chronic hypoxic respiratory failure in the setting of advanced COPD on 4 L at baseline Given Lasix post blood transfusion due to mild pulmonary edema Diuresed well with net I's and O's -1.8 L Currently not taking COPD specific medications Pulmonology consulted to assist in the management of his advanced COPD Follow-up with pulmonology outpatient Obtain referral for pulmonology from your PCP  Acute blood losssuspect secondary to diverticular bleed Recurrence of GI bleed this admission 05/23/18 Post 2 unit PRBC transfusion Continue to closely monitor VS Stop BP meds and diuretic in the setting of acute lower GI bleed  Mild pulmonary edema from recent blood transfusion and admission IV fluid hydration in the setting of lower GI bleed Diuresed well Electrolytes repleted  AKI, resolved Presented withcr1.76 Creatinine is back to baseline GFR greater than 60 Follow-up with your PCP  Hypomagnesemia Resolved  Essential hypertension- Blood pressure is on low side of normal this am Hold antihypertensives due to acute lower GI bleed  Type 2 diabetes with hyperglycemia, improving Hemoglobin A1c 6.9 on 05/21/2018 Resume home antidiabetic medications  Uncontrolled COPD with pulmonary fibrosis/emphysema Obtain referral to pulmonology from your PCP  Pulmonary fibrosis (HCC)-continue home oxygen.  Follow-up with pulmonology  OSA Follow-up with pulmonology outpatient for sleep study Currently only on 4 L nasal cannula Not on CPAP at home  Constipation Started on daily MiraLAX, Senokot 2 tablets twice daily and 1 dose of Dulcolax rectally Follow-up with your PCP and GI    Code Status:Full  Consults called: GI Dr. Benson Norway, pulmonology     Discharge Exam: BP (!) 121/93   Pulse 71   Temp 99.3 F (37.4 C) (Oral)    Resp (!) 22   Ht _0  (1.727 m)   Wt 90.6 kg   SpO2 96%   BMI 30.37 kg/m  . General: 84 y.o. year-old male well developed well nourished in no acute distress.  Alert and oriented x3. . Cardiovascular: Regular rate and rhythm with no rubs or gallops.  No thyromegaly or JVD noted.   Marland Kitchen Respiratory: Clear to auscultation with no wheezes or rales. Good inspiratory effort. . Abdomen: Soft nontender nondistended with normal bowel sounds x4 quadrants. . Musculoskeletal: No lower extremity edema. 2/4 pulses in all 4 extremities. . Skin: No ulcerative lesions noted or rashes, . Psychiatry: Mood is appropriate for condition and setting  Discharge Instructions You were cared for by a hospitalist during your hospital stay. If you have any questions about your discharge medications or the care you received while you were in the hospital after you are discharged, you can call the unit and asked to speak with the hospitalist on call if the hospitalist that took care of you is not available. Once you are discharged, your primary care physician will handle any further medical issues. Please note that NO REFILLS for any discharge medications will be authorized once you are discharged, as it is imperative that you return to your primary care physician (or establish a relationship with a primary care physician if you do not have one) for your aftercare needs so  that they can reassess your need for medications and monitor your lab values.   Allergies as of 05/23/2018   No Known Allergies     Medication List    TAKE these medications   ACCU-CHEK AVIVA PLUS test strip Generic drug:  glucose blood Reported on 06/20/2015   ACCU-CHEK AVIVA PLUS w/Device Kit Reported on 06/20/2015   ACCU-CHEK SOFTCLIX LANCETS lancets Reported on 06/20/2015   amLODipine 10 MG tablet Commonly known as:  NORVASC Take 1 tablet (10 mg total) by mouth daily.   aspirin 81 MG tablet Take 81 mg by mouth daily.   atorvastatin 80 MG  tablet Commonly known as:  LIPITOR Take 1 tablet (80 mg total) by mouth daily. What changed:  when to take this   B-D UF III MINI PEN NEEDLES 31G X 5 MM Misc Generic drug:  Insulin Pen Needle Reported on 06/20/2015   carvedilol 12.5 MG tablet Commonly known as:  COREG Take 1 tablet (12.5 mg total) by mouth 2 (two) times daily with a meal.   colchicine 0.6 MG tablet Take 1 tablet (0.6 mg total) by mouth 2 (two) times daily as needed. What changed:  reasons to take this   doxazosin 4 MG tablet Commonly known as:  CARDURA Take 1 tablet (4 mg total) by mouth at bedtime.   HYDROcodone-acetaminophen 5-325 MG tablet Commonly known as:  NORCO/VICODIN Take 1 tablet by mouth every 8 (eight) hours as needed. What changed:  reasons to take this   ketoconazole 2 % cream Commonly known as:  NIZORAL Apply 1 application topically daily. What changed:    when to take this  reasons to take this   losartan 50 MG tablet Commonly known as:  COZAAR Take 1 tablet (50 mg total) by mouth daily.   metFORMIN 500 MG tablet Commonly known as:  GLUCOPHAGE Take 500 mg by mouth 2 (two) times daily with a meal.   multivitamin tablet Take 1 tablet by mouth daily.   OXYGEN Inhale into the lungs. 3L Nocturnal and PRN for Hypoxemia   pioglitazone 45 MG tablet Commonly known as:  ACTOS Take 45 mg by mouth daily.   polyethylene glycol packet Commonly known as:  MIRALAX / GLYCOLAX Take 17 g by mouth daily. Start taking on:  May 24, 2018   senna-docusate 8.6-50 MG tablet Commonly known as:  Senokot-S Take 2 tablets by mouth 2 (two) times daily.      No Known Allergies Follow-up Information    Colon Branch, MD. Call in 1 day(s).   Specialty:  Internal Medicine Why:  Please call for a post hospital follow-up appointment. Contact information: [address] [place] [address] [phone]        Carol Ada, MD. Call in 1 day(s).   Specialty:   Gastroenterology Why:  Please call for post hospital follow-up appointment. Contact information: [place], [place] [address] [phone]            The results of significant diagnostics from this hospitalization (including imaging, microbiology, ancillary and laboratory) are listed below for reference.    Significant Diagnostic Studies: Nm Gi Blood Loss  Result Date: 05/20/2018 CLINICAL DATA:  Recent GI bleeding, occurring early this morning. Patient had a colonoscopy on 05/18/2018. EXAM: NUCLEAR MEDICINE GASTROINTESTINAL BLEEDING SCAN TECHNIQUE: Sequential abdominal images were obtained following intravenous administration of Tc-20mlabeled red blood cells. RADIOPHARMACEUTICALS:  23.9 mCi Tc-943mertechnetate in-vitro labeled red cells. COMPARISON:  None. FINDINGS: There is no  abnormal radiotracer accumulation to indicate a GI bleeding source. Physiologic uptake is noted. There is some uptake below the perineum consistent with urine leakage. IMPRESSION: 1. No evidence of a GI bleeding source. Electronically Signed   By: Lajean Manes M.D.   On: 05/20/2018 12:36   Dg Chest Port 1 View  Result Date: 05/22/2018 CLINICAL DATA:  Shortness of breath EXAM: PORTABLE CHEST 1 VIEW COMPARISON:  November 19, 2016 FINDINGS: There is patchy airspace consolidation throughout portions of the left mid lower lung zones. There is also patchy infiltrate in the right base. Heart is mildly enlarged with pulmonary vascularity normal. No adenopathy. There is aortic atherosclerosis. No bone lesions. IMPRESSION: Patchy airspace opacity, most likely pneumonia in the left mid lower lung zones as well as to a lesser extent in the right base. Mild cardiac prominence is stable. Aortic Atherosclerosis (ICD10-I70.0). Electronically Signed   By: Lowella Grip III M.D.   On: 05/22/2018 09:53    Microbiology: Recent Results (from the past 240 hour(s))  MRSA PCR Screening     Status: None    Collection Time: 05/20/18  4:40 AM  Result Value Ref Range Status   MRSA by PCR NEGATIVE NEGATIVE Final    Comment:        The GeneXpert MRSA Assay (FDA approved for NASAL specimens only), is one component of a comprehensive MRSA colonization surveillance program. It is not intended to diagnose MRSA infection nor to guide or monitor treatment for MRSA infections. Performed at [facility], [place] [address], [place], [place] [address]      Labs: Basic Metabolic Panel: Recent Labs  Lab 05/20/18 0112 05/20/18 6269 05/21/18 0252 05/22/18 0524 05/23/18 0352  NA 140 141 140 140 141  K 4.3 4.0 4.1 3.9 3.6  CL 107 111 110 105 101  CO2 _0 33*  GLUCOSE 141* 124* 90 87 87  BUN _1 CREATININE 1.76* 1.42* 1.04 0.86 1.03  CALCIUM 8.3* 7.7* 7.7* 8.4* 8.3*  MG  --   --   --  1.6* 1.7   Liver Function Tests: Recent Labs  Lab 05/18/18 0702 05/20/18 0112 05/22/18 0524  AST 55* 72* 40  ALT _2 ALKPHOS 47 45 45  BILITOT 1.4* 0.8 1.2  PROT 6.2* 6.3* 5.9*  ALBUMIN 3.5 3.5 3.3*   No results for input(s): LIPASE, AMYLASE in the last 168 hours. No results for input(s): AMMONIA in the last 168 hours. CBC: Recent Labs  Lab 05/16/18 2238  05/20/18 0112 05/20/18 0624 05/20/18 1920 05/21/18 0252 05/21/18 1055 05/22/18 0524 05/23/18 0352  WBC 8.2   < > 8.4 5.7  --  6.0  --  6.0 6.1  NEUTROABS 5.7  --   --   --   --  3.2  --   --   --   HGB 12.0*   < > 9.5* 7.9* 7.4* 7.4*  7.6* 7.7* 10.6* 10.6*  HCT 39.5   < > 30.8* 25.9* 24.4* 25.1*  26.1* 25.6* 34.5* 34.3*  MCV 100.8*   < > 100.7* 103.2*  --  104.4*  --  98.6 97.2  PLT 163   < > 177 127*  --  143*  --  155 174   < > = values in this interval not displayed.   Cardiac Enzymes: No results for input(s): CKTOTAL, CKMB, CKMBINDEX, TROPONINI in the last 168 hours. BNP: BNP (last 3 results) No results for input(s): BNP in  the last 8760 hours.  ProBNP (last 3 results) No results for  input(s): PROBNP in the last 8760 hours.  CBG: Recent Labs  Lab 05/22/18 0741 05/22/18 1108 05/22/18 1850 05/22/18 2133 05/23/18 0726  GLUCAP 89 145* 142* 130* 95       Signed:  Kayleen Memos, MD [facility] 05/23/2018, 10:15 AM

## 2018-05-23 NOTE — Care Management Note (Addendum)
Case Management Note  Patient Details  Name: Bradley Johns MRN: [id number] Date of Birth: 04-Mar-1935  Subjective/Objective:   Admitted with acute GI bleed, acute on chronic resp failure, COPD, pulm edema               Action/Plan: Spoke to pt and offered choice for HH/Medicare list provided and placed on chart. Pt agreeable to [facility] for HH. Contacted [facility] with new referral. Pt has cane and Oxygen ([employer]) at home. Wife and dtr will assist as needed.   PCP PAZ, JOSE E    Expected Discharge Date:  05/23/18               Expected Discharge Plan:  [place]  In-House Referral:  NA  Discharge planning Services  CM Consult  Post Acute Care Choice:  Home Health Choice offered to:  Patient  DME Arranged:  N/A DME Agency:  NA  HH Arranged:  PT, RN, OT, Nurse's Aide [place] Agency:  [facility]  Status of Service:  Completed, signed off  If discussed at [place] of Stay Meetings, dates discussed:    Additional Comments:  Erenest Rasher, RN 05/23/2018, 1:12 PM

## 2018-05-23 NOTE — Progress Notes (Signed)
Pt ambulated half way around unit from room. Just prior to ambulation, (I.e. when he stood up from chair) O2 sats dropped to 87% on 4 liters [place]. After standing for about a minute to 2, sats recovered from 87% up to 94% on 4 liters. Upon ambulation, sats dropped to the lowest of 76% on 4 liters as pt progressed in ambulation with HR up to 101. O2 rate then increased to 6 liters/min, increasing saturation up to 95%, at which rate pt remained until returning to room. Pt tolerated ambulation with the above changes in O2 saturation. VWilliams,RN.

## 2018-05-23 NOTE — Progress Notes (Signed)
Pt had a bowel movement of blocks of small formed stools with dark red blood on stools. Bright red blood noted on tissue after wip0ing.  Will cont to monitor. VWilliams,RN.

## 2018-05-24 DIAGNOSIS — Z87891 Personal history of nicotine dependence: Secondary | ICD-10-CM | POA: Diagnosis not present

## 2018-05-24 DIAGNOSIS — Z7982 Long term (current) use of aspirin: Secondary | ICD-10-CM | POA: Diagnosis not present

## 2018-05-24 DIAGNOSIS — D62 Acute posthemorrhagic anemia: Secondary | ICD-10-CM

## 2018-05-24 DIAGNOSIS — J439 Emphysema, unspecified: Secondary | ICD-10-CM

## 2018-05-24 DIAGNOSIS — J811 Chronic pulmonary edema: Secondary | ICD-10-CM | POA: Diagnosis present

## 2018-05-24 DIAGNOSIS — I10 Essential (primary) hypertension: Secondary | ICD-10-CM | POA: Diagnosis present

## 2018-05-24 DIAGNOSIS — J8410 Pulmonary fibrosis, unspecified: Secondary | ICD-10-CM

## 2018-05-24 DIAGNOSIS — E1165 Type 2 diabetes mellitus with hyperglycemia: Secondary | ICD-10-CM | POA: Diagnosis present

## 2018-05-24 DIAGNOSIS — G4733 Obstructive sleep apnea (adult) (pediatric): Secondary | ICD-10-CM | POA: Diagnosis present

## 2018-05-24 DIAGNOSIS — Z9981 Dependence on supplemental oxygen: Secondary | ICD-10-CM | POA: Diagnosis not present

## 2018-05-24 DIAGNOSIS — J9621 Acute and chronic respiratory failure with hypoxia: Secondary | ICD-10-CM | POA: Diagnosis present

## 2018-05-24 DIAGNOSIS — Z79899 Other long term (current) drug therapy: Secondary | ICD-10-CM | POA: Diagnosis not present

## 2018-05-24 DIAGNOSIS — M1990 Unspecified osteoarthritis, unspecified site: Secondary | ICD-10-CM | POA: Diagnosis present

## 2018-05-24 DIAGNOSIS — Z7984 Long term (current) use of oral hypoglycemic drugs: Secondary | ICD-10-CM | POA: Diagnosis not present

## 2018-05-24 DIAGNOSIS — Z833 Family history of diabetes mellitus: Secondary | ICD-10-CM | POA: Diagnosis not present

## 2018-05-24 DIAGNOSIS — N179 Acute kidney failure, unspecified: Secondary | ICD-10-CM | POA: Diagnosis present

## 2018-05-24 DIAGNOSIS — N400 Enlarged prostate without lower urinary tract symptoms: Secondary | ICD-10-CM | POA: Diagnosis present

## 2018-05-24 DIAGNOSIS — Z8249 Family history of ischemic heart disease and other diseases of the circulatory system: Secondary | ICD-10-CM | POA: Diagnosis not present

## 2018-05-24 DIAGNOSIS — K922 Gastrointestinal hemorrhage, unspecified: Secondary | ICD-10-CM | POA: Diagnosis not present

## 2018-05-24 DIAGNOSIS — K5900 Constipation, unspecified: Secondary | ICD-10-CM | POA: Diagnosis present

## 2018-05-24 DIAGNOSIS — M109 Gout, unspecified: Secondary | ICD-10-CM | POA: Diagnosis present

## 2018-05-24 DIAGNOSIS — K625 Hemorrhage of anus and rectum: Secondary | ICD-10-CM | POA: Diagnosis present

## 2018-05-24 DIAGNOSIS — E785 Hyperlipidemia, unspecified: Secondary | ICD-10-CM | POA: Diagnosis present

## 2018-05-24 DIAGNOSIS — G4730 Sleep apnea, unspecified: Secondary | ICD-10-CM | POA: Diagnosis present

## 2018-05-24 DIAGNOSIS — K5731 Diverticulosis of large intestine without perforation or abscess with bleeding: Secondary | ICD-10-CM | POA: Diagnosis present

## 2018-05-24 LAB — GLUCOSE, CAPILLARY
Glucose-Capillary: 110 mg/dL — ABNORMAL HIGH (ref 70–99)
Glucose-Capillary: 164 mg/dL — ABNORMAL HIGH (ref 70–99)
Glucose-Capillary: 140 mg/dL — ABNORMAL HIGH (ref 70–99)
Glucose-Capillary: 100 mg/dL — ABNORMAL HIGH (ref 70–99)
Glucose-Capillary: 215 mg/dL — ABNORMAL HIGH (ref 70–99)

## 2018-05-24 LAB — CBC
HCT: 34.1 % — ABNORMAL LOW (ref 39.0–52.0)
Hemoglobin: 10.6 g/dL — ABNORMAL LOW (ref 13.0–17.0)
MCH: 30.2 pg (ref 26.0–34.0)
MCHC: 31.1 g/dL (ref 30.0–36.0)
MCV: 97.2 fL (ref 80.0–100.0)
Platelets: 191 10*3/uL (ref 150–400)
RBC: 3.51 MIL/uL — ABNORMAL LOW (ref 4.22–5.81)
RDW: 15.1 % (ref 11.5–15.5)
WBC: 6.4 10*3/uL (ref 4.0–10.5)
nRBC: 0 % (ref 0.0–0.2)

## 2018-05-24 LAB — BPAM RBC
Blood Product Expiration Date: 202003122359
Blood Product Expiration Date: 202003122359
ISSUE DATE / TIME: 202002142051
ISSUE DATE / TIME: 202002142350
Unit Type and Rh: 5100
Unit Type and Rh: 5100

## 2018-05-24 LAB — TYPE AND SCREEN
ABO/RH(D): O POS
Antibody Screen: NEGATIVE
Unit division: 0
Unit division: 0

## 2018-05-24 MED ORDER — HYDROCODONE-ACETAMINOPHEN 5-325 MG PO TABS
1.0000 | ORAL_TABLET | Freq: Three times a day (TID) | ORAL | Status: DC | PRN
  Administered 2018-05-24: 1 via ORAL
  Filled 2018-05-24: qty 1

## 2018-05-24 NOTE — Telephone Encounter (Signed)
Person answering phone "Dayshaun Whobrey" states that pt is still in [facility]

## 2018-05-24 NOTE — Progress Notes (Signed)
Physical Therapy Treatment Patient Details Name: Bradley Johns MRN: [id number] DOB: 05-25-1934 Today's Date: 05/24/2018    History of Present Illness 84 y.o. male with past medical history significant for pulmonary fibrosis and COPD on 4 L of oxygen chronically at home per nasal cannula, hypertension and admitted for recurrent GI bleed.    PT Comments    Pt ambulated in hallway on 4L O2 and SpO2 dropped to 83%.  Pt encouraged to perform pursed lip breathing.  Pt reports possible d/c home today.   Follow Up Recommendations  Home health PT     Equipment Recommendations  None recommended by PT    Recommendations for Other Services       Precautions / Restrictions Precautions Precautions: Other (comment) Precaution Comments: monitor sats    Mobility  Bed Mobility Overal bed mobility: Needs Assistance Bed Mobility: Sit to Supine;Supine to Sit     Supine to sit: Supervision;HOB elevated Sit to supine: Supervision      Transfers Overall transfer level: Needs assistance Equipment used: None Transfers: Sit to/from Stand Sit to Stand: Min guard         General transfer comment: min/guard for safety due to lines, no external assist required, pt reports L Achilles heel knot/raised spot painful for DF so utilized RW for ambulation (pt reports RN aware of pain, encouraged pt to inform MD today)  Ambulation/Gait Ambulation/Gait assistance: Min guard Gait Distance (Feet): 100 Feet Assistive device: Rolling walker (2 wheeled) Gait Pattern/deviations: Step-through pattern;Decreased stride length     General Gait Details: provided RW for L heel pain however pt pushed IV pole in L hand and RW in R hand, recommended SPC upon d/c for pain control and steadying, cues for pursed lip breathing, SPo2 dropped to 83% on 4L    Stairs             Wheelchair Mobility    Modified Rankin (Stroke Patients Only)       Balance                                             Cognition Arousal/Alertness: Awake/alert Behavior During Therapy: WFL for tasks assessed/performed Overall Cognitive Status: Within Functional Limits for tasks assessed                                        Exercises      General Comments        Pertinent Vitals/Pain Pain Assessment: No/denies pain    Home Living                      Prior Function            PT Goals (current goals can now be found in the care plan section) Progress towards PT goals: Progressing toward goals    Frequency    Min 3X/week      PT Plan Current plan remains appropriate    Co-evaluation              AM-PAC PT "6 Clicks" Mobility   Outcome Measure  Help needed turning from your back to your side while in a flat bed without using bedrails?: A Little Help needed moving from lying on your back to sitting on the side of  a flat bed without using bedrails?: A Little Help needed moving to and from a bed to a chair (including a wheelchair)?: A Little Help needed standing up from a chair using your arms (e.g., wheelchair or bedside chair)?: A Little Help needed to walk in hospital room?: A Little Help needed climbing 3-5 steps with a railing? : A Lot 6 Click Score: 17    End of Session Equipment Utilized During Treatment: Gait belt;Oxygen Activity Tolerance: Patient tolerated treatment well Patient left: with call bell/phone within reach;in chair   PT Visit Diagnosis: Difficulty in walking, not elsewhere classified (R26.2)     Time: 0093-8182 PT Time Calculation (min) (ACUTE ONLY): 19 min  Charges:  $Gait Training: 8-22 mins                    Carmelia Bake, PT, DPT Acute Rehabilitation Services Office: [phone] Pager: [phone]  Bradley Johns 05/24/2018, 1:10 PM

## 2018-05-24 NOTE — Progress Notes (Addendum)
PROGRESS NOTE  Bradley Johns FHL:[id number] DOB: 1935-03-20 DOA: 05/20/2018 PCP: Colon Branch, MD   LOS: 0 days   Brief narrative:  Bradley Johns a 84 y.o.malewith medical history significant ofpulmonary fibrosis and COPD on 3 to 4 L of oxygen chronically at home per nasal cannula, hypertension just discharged on February 11 for lower GI bleed had a colonoscopy done by Dr. Benson Norway showing diverticulosis without any evidence of active bleeding had a polyp that was removed. Patient reported that his bleeding seemed to have stopped prior to his discharge so he was sent home and his aspirin was resumed. He was at home 24 hours when he started having bright red blood per rectum again that was darker in nature but not melanotic. His hemoglobin had dropped from 12 on that admission down to 9.  Patient was then admitted to the hospital.  Hospital course was complicated by hypoxia with mobilization.  Patient desaturated in the 70s on 4 L of oxygen via nasal cannula.  Consulted pulmonology to assist in management of his advanced COPD.  Received 2 unit PRBCs.  Assessment/Plan:  Principal Problem:   Acute GI bleeding Active Problems:   Essential hypertension   COPD with emphysema, on O2   Pulmonary fibrosis (HCC)   Lower GI bleed   Anemia associated with acute blood loss  LowerGI bleed/diverticular bleed: Patient denies any further bowel movements today.  Mild drop in hemoglobin noted.  Will closely monitor.  I spoke with Dr. Benson Norway who recommended follow-up and no immediate intervention.  Post 2 unit PRBC transfusion.  Monitor overnight for bleeding.  Acute on chronichypoxic respiratory failure in the setting of advanced COPD on 4 L at baseline Patient received Lasix status post blood transfusion.  Patient is still requiring higher flow of oxygen especially on exertion.  Seen by physical therapy today.  Spoke with case management regarding this.  Acute blood lossanemia suspect secondary  to diverticular bleed Recurrence of GI bleed this admission 05/23/18,  Post 2 unit PRBC transfusion.  Check CBC in a.m. patient has not had a bowel movement today.  Will closely monitor hemoglobin in a.m.  Mild pulmonary edema from recent blood transfusion and admission IV fluid hydration in the setting of lower GI bleed.  Improving.    AKI, resolved.  Avoid further Lasix  Hypomagnesemia Resolved  Essential hypertension- Latest blood pressure is marginally low.  Consider restarting blood pressure medication when blood pressure is more stable.  Type 2 diabetes with hyperglycemia, improving Hemoglobin A1c 6.9 on 05/21/2018  COPD with pulmonary fibrosis/emphysema To follow-up with pulmonary as outpatient, will need PCP to manage for this.  Patient will likely need 5 to 6 L of oxygen on discharge.  OSA Patient is currently using oxygen by nasal cannula but could be potentially benefited by CPAP.  Patient used to be on CPAP in the past.  Needs to be addressed as outpatient.  Constipation Continue MiraLAX and Senokot.  Takes Dulcolax at home.   VTE Prophylaxis: SCD  Code Status: Full code  Family Communication: No one at bedside  Disposition Plan: Home with home health likely in 1-2 days likely am if he remains stable.  We will check a hemoglobin in a.m.  Continue bowel regimen.   Consultants:  GI  Procedures:  PRBC transfusion  Antibiotics: Anti-infectives (From admission, onward)   None       Subjective: Patient states that he has not had a bowel movement and is concerned about it.  Denies  any chest pain palpitation but is requiring high flow oxygen especially on exertion.  Objective: Vitals:   05/24/18 1300 05/24/18 1400  BP: (!) 120/46 (!) 101/47  Pulse: 65 64  Resp: 13 18  Temp:    SpO2: 95% 94%    Intake/Output Summary (Last 24 hours) at 05/24/2018 1457 Last data filed at 05/24/2018 1300 Gross per 24 hour  Intake 80 ml  Output 675 ml  Net -595  ml   Filed Weights   05/20/18 0025 05/20/18 0443 05/21/18 0442  Weight: 92.5 kg 90.3 kg 90.6 kg   Body mass index is 30.37 kg/m.   Physical Exam: GENERAL: Patient is alert awake and oriented. Not in obvious distress.  On nasal cannula oxygen HENT: Mild pallor noted.  Pupils equally reactive to light. Oral mucosa is moist NECK: is supple, no palpable thyroid enlargement. CHEST: Diminished breath sounds bilaterally.  Mild crackles noted. CVS: S1 and S2 heard, no murmur. Regular rate and rhythm. No pericardial rub. ABDOMEN: Soft, non-tender, bowel sounds are present. No palpable hepato-splenomegaly. EXTREMITIES: No edema. CNS: Cranial nerves are intact. No focal motor or sensory deficits. SKIN: warm and dry without rashes.  Data Review: I have personally reviewed the following laboratory data and studies,  CBC: Recent Labs  Lab 05/20/18 0624  05/21/18 0252 05/21/18 1055 05/22/18 0524 05/23/18 0352 05/23/18 1343 05/23/18 1911 05/24/18 0337  WBC 5.7  --  6.0  --  6.0 6.1  --   --  6.4  NEUTROABS  --   --  3.2  --   --   --   --   --   --   HGB 7.9*   < > 7.4*  7.6* 7.7* 10.6* 10.6* 11.4* 11.3* 10.6*  HCT 25.9*   < > 25.1*  26.1* 25.6* 34.5* 34.3*  --   --  34.1*  MCV 103.2*  --  104.4*  --  98.6 97.2  --   --  97.2  PLT 127*  --  143*  --  155 174  --   --  191   < > = values in this interval not displayed.   Basic Metabolic Panel: Recent Labs  Lab 05/20/18 0112 05/20/18 0624 05/21/18 0252 05/22/18 0524 05/23/18 0352  NA 140 141 140 140 141  K 4.3 4.0 4.1 3.9 3.6  CL 107 111 110 105 101  CO2 27 27 24 28  33*  GLUCOSE 141* 124* 90 87 87  BUN 20 20 12 11 14   CREATININE 1.76* 1.42* 1.04 0.86 1.03  CALCIUM 8.3* 7.7* 7.7* 8.4* 8.3*  MG  --   --   --  1.6* 1.7   Liver Function Tests: Recent Labs  Lab 05/18/18 0702 05/20/18 0112 05/22/18 0524  AST 55* 72* 40  ALT 17 27 26   ALKPHOS 47 45 45  BILITOT 1.4* 0.8 1.2  PROT 6.2* 6.3* 5.9*  ALBUMIN 3.5 3.5 3.3*    No results for input(s): LIPASE, AMYLASE in the last 168 hours. No results for input(s): AMMONIA in the last 168 hours. Cardiac Enzymes: No results for input(s): CKTOTAL, CKMB, CKMBINDEX, TROPONINI in the last 168 hours. BNP (last 3 results) No results for input(s): BNP in the last 8760 hours.  ProBNP (last 3 results) No results for input(s): PROBNP in the last 8760 hours.  CBG: Recent Labs  Lab 05/23/18 1619 05/23/18 2122 05/23/18 2242 05/24/18 0907 05/24/18 1125  GLUCAP 92 184* 139* 164* 140*   Recent Results (from the past 240 hour(s))  MRSA PCR Screening     Status: None   Collection Time: 05/20/18  4:40 AM  Result Value Ref Range Status   MRSA by PCR NEGATIVE NEGATIVE Final    Comment:        The GeneXpert MRSA Assay (FDA approved for NASAL specimens only), is one component of a comprehensive MRSA colonization surveillance program. It is not intended to diagnose MRSA infection nor to guide or monitor treatment for MRSA infections. Performed at [facility], [place] [address], [place], [place] [address]      Studies: No results found.  Scheduled Meds: . sodium chloride   Intravenous Once  . amLODipine  10 mg Oral Daily  . [START ON 05/25/2018] carvedilol  12.5 mg Oral BID  . doxazosin  4 mg Oral QHS  . insulin aspart  0-5 Units Subcutaneous QHS  . insulin aspart  0-9 Units Subcutaneous TID WC  . [START ON 05/25/2018] losartan  50 mg Oral Daily  . mouth rinse  15 mL Mouth Rinse BID  . polyethylene glycol  17 g Oral Daily  . senna-docusate  2 tablet Oral BID    Continuous Infusions:   Flora Lipps, MD  [facility] 05/24/2018

## 2018-05-24 NOTE — Progress Notes (Signed)
Pt. had ambulated to the bathroom and had a bowel movement. Per pt.it was small, hard but dark but no visible blood. NT was at bedside ,but no documentation of the stool.

## 2018-05-25 ENCOUNTER — Encounter: Payer: Self-pay | Admitting: *Deleted

## 2018-05-25 LAB — GLUCOSE, CAPILLARY
Glucose-Capillary: 99 mg/dL (ref 70–99)
Glucose-Capillary: 140 mg/dL — ABNORMAL HIGH (ref 70–99)

## 2018-05-25 LAB — CBC
HCT: 34 % — ABNORMAL LOW (ref 39.0–52.0)
Hemoglobin: 10.4 g/dL — ABNORMAL LOW (ref 13.0–17.0)
MCH: 30.9 pg (ref 26.0–34.0)
MCHC: 30.6 g/dL (ref 30.0–36.0)
MCV: 100.9 fL — ABNORMAL HIGH (ref 80.0–100.0)
Platelets: 171 10*3/uL (ref 150–400)
RBC: 3.37 MIL/uL — ABNORMAL LOW (ref 4.22–5.81)
RDW: 14.8 % (ref 11.5–15.5)
WBC: 6.7 10*3/uL (ref 4.0–10.5)
nRBC: 0 % (ref 0.0–0.2)

## 2018-05-25 LAB — BASIC METABOLIC PANEL
Anion gap: 8 (ref 5–15)
BUN: 21 mg/dL (ref 8–23)
CO2: 30 mmol/L (ref 22–32)
Calcium: 8.4 mg/dL — ABNORMAL LOW (ref 8.9–10.3)
Chloride: 100 mmol/L (ref 98–111)
Creatinine, Ser: 1.27 mg/dL — ABNORMAL HIGH (ref 0.61–1.24)
GFR calc Af Amer: >60 mL/min (ref >60)
GFR calc non Af Amer: 53 mL/min — ABNORMAL LOW (ref >60)
Glucose, Bld: 87 mg/dL (ref 70–99)
Potassium: 3.6 mmol/L (ref 3.5–5.1)
Sodium: 138 mmol/L (ref 135–145)

## 2018-05-25 LAB — MAGNESIUM: Magnesium: 1.8 mg/dL (ref 1.7–2.4)

## 2018-05-25 MED ORDER — COLCHICINE 0.6 MG PO TABS
0.6000 mg | ORAL_TABLET | Freq: Two times a day (BID) | ORAL | Status: DC | PRN
  Administered 2018-05-25: 0.6 mg via ORAL
  Filled 2018-05-25 (×2): qty 1

## 2018-05-25 MED ORDER — SENNOSIDES-DOCUSATE SODIUM 8.6-50 MG PO TABS: 2.0000 | ORAL_TABLET | Freq: Two times a day (BID) | ORAL | 0 refills | Status: DC

## 2018-05-25 MED ORDER — POLYETHYLENE GLYCOL 3350 17 G PO PACK: 17.0000 g | PACK | Freq: Every day | ORAL | 0 refills | Status: DC | PRN

## 2018-05-25 NOTE — Progress Notes (Signed)
Pt given discharge instructions with understanding. Pt has no questions at this time. IV and monitor d/c. Awaiting wife's arrival to take home.

## 2018-05-25 NOTE — Consult Note (Addendum)
   [facility] CM Inpatient Consult   05/25/2018  Bradley Johns Feb 25, 1935 [id number]    Received referral from [place] Management office due to [facility] alert.  Went to bedside to speak with Bradley Johns about [place] Management services. He is agreeable and [facility] Care Management written consent obtained. [facility] folder provided.  Bradley Johns endorses that he lives with his wife. Denies having any concerns with medications or with transportation. [place] mail order pharmacy.  Confirms Primary Care MD is Dr. Larose Kells. [facility] [place] [facility] is listed as doing transition of care call post discharge.  Confirmed best contact number for Bradley Johns is home [phone] and cell [phone].  Discussed [facility] Care Management [facility] RNCM follow up for COPD. Bradley Johns also has medical history of HTN, DM, AKI.  Bradley Johns expresses appreciation of visit. Explained [facility] Care Management will not interfere or replace home health services.   Made inpatient RNCM aware [facility] will follow post discharge.      Marthenia Rolling, MSN-Ed, RN,BSN [facility] Liaison [phone]

## 2018-05-25 NOTE — Discharge Summary (Signed)
Discharge Summary  MARKEVIOUS EHMKE NID:[id number] DOB: [birth year]  PCP: Colon Branch, MD  Admit date: 05/20/2018   Discharge date: 05/25/2018  Time spent: 39 minutes  Recommendations for Outpatient Follow-up:  1. Follow-up with your PCP in one week and obtain pulmonology referral from PCP 2. Follow-up with pulmonology  3. Take your medications as prescribed 4. Continue physical therapy at home 5. Fall precautions  Discharge Diagnoses:  Active Hospital Problems   Diagnosis Date Noted  . Acute GI bleeding 05/17/2018  . Lower GI bleed 05/20/2018  . Anemia associated with acute blood loss 05/20/2018  . GI bleed 05/17/2018  . Pulmonary fibrosis ([place]) 12/14/2012  . COPD with emphysema, on O2 04/29/2006  . Essential hypertension 04/29/2006    Resolved Hospital Problems  No resolved problems to display.    Diet recommendation: Heart healthy carb modified diet  Vitals:   05/25/18 0800 05/25/18 0900  BP: (!) 151/50 (!) 139/42  Pulse: 67 64  Resp: 11 17  Temp:    SpO2: 95% 96%    History of present illness:   Bradley Johns a 84 y.o.malewith medical history significant ofpulmonary fibrosis and COPD on 3 to 4 L of oxygen chronically at home per nasal cannula, hypertension just discharged on February 11 for lower GI bleed had a colonoscopy done by Dr. Benson Norway showing diverticulosis without any evidence of active bleeding had a polyp that was removed. Patient reported that his bleeding seemed to have stopped prior to his discharge so he was sent home and his aspirin was resumed. He was at home 24 hours when he started having bright red blood per rectum again that was darker in nature but not melanotic. His hemoglobin had dropped from 12 on that admission down to 9.  Patient was then admitted to the hospital.  Hospital course was complicated by hypoxia with mobilization. Patient desaturated in the 70s on 4 L of oxygen via nasal cannula. Consulted pulmonology to assist in  management of his advanced COPD. Received 2 unit PRBCs.   Hospital Course:   LowerGI bleed/diverticular bleed: Patient received 2 years of packed RBC during hospitalization.  He did not have further recurrence of GI bleed.  GI recommended no further intervention.  Acute on chronichypoxic respiratory failure in the setting of advanced COPD on 4 L at baseline Patient received Lasix status post blood transfusion.  He was seen by physical therapy.  And will likely need 5 L of oxygen on ambulation.  He has been arranged on discharge.  Acute blood lossanemia suspect secondary to diverticular bleed Recurrence of GI bleedthis admission2/16/20,  Post 2 unit PRBC transfusion.   Hemoglobin has remained stable.  AKI, resolved.    Hypomagnesemia Resolved  Essential hypertension- Restart blood pressure medications on discharge.  Type 2 diabetes mellitus. Hemoglobin A1c 6.9 on 05/21/2018.  Received sliding scale insulin in hospital.  Will resume OHA on discharge.  COPD withpulmonary fibrosis/emphysema To follow-up with pulmonary as outpatient, will need PCP to manage for this.    OSA Patient is currently using oxygen by nasal cannula but could be potentially benefited by CPAP.  Patient used to be on CPAP in the past.  Needs to be addressed as outpatient.  Constipation Continue MiraLAX and Senokot.  Takes Dulcolax at home. Patient was advised to continue taking stool softeners at home to ensure soft bowel movements.   Code Status:Full  Consults  GI Dr. Benson Norway, pulmonology   Discharge Exam: BP (!) 139/42   Pulse 64  Temp 98 F (36.7 C) (Oral)   Resp 17   Ht '5\' 8"'$  (1.727 m)   Wt 90.6 kg   SpO2 96%   BMI 30.37 kg/m    General:  Average built, not in obvious distress, on nasal cannula oxygen HENT: Normocephalic, pupils equally reacting to light and accommodation.  No scleral pallor or icterus noted. Oral mucosa is moist.  Chest: Coarse breath sounds noted CVS: S1  &S2 heard. No murmur.  Regular rate and rhythm. Abdomen: Soft, nontender, nondistended.  Bowel sounds are heard.  Liver is not palpable, no abdominal mass palpated Extremities: No cyanosis, clubbing or edema.  Peripheral pulses are palpable.  Left ankle with mild swelling from gouty flare with mild erythema. Psych: Alert, awake and oriented, normal mood CNS:  No cranial nerve deficits.  Power equal in all extremities.  No sensory deficits noted.  No cerebellar signs.   Skin: Warm and dry.  No rashes noted.   Discharge Instructions You were cared for by a hospitalist during your hospital stay. Once you are discharged, your primary care physician will handle any further medical issues.   Discharge Instructions    Diet - low sodium heart healthy   Complete by:  As directed    Discharge instructions   Complete by:  As directed    Follow up with your primary care physician in one week. Check blood work at that time. Start aspirin 05/27/2018.  Seek medical attention for worsening symptoms or ongoing bleeding.  Please take a stool softeners every day to make your bowels soft.  Continue to use oxygen at home upto 5 L when walking.   Increase activity slowly   Complete by:  As directed      Allergies as of 05/25/2018   No Known Allergies     Medication List    TAKE these medications   ACCU-CHEK AVIVA PLUS test strip Generic drug:  glucose blood Reported on 06/20/2015   ACCU-CHEK AVIVA PLUS w/Device Kit Reported on 06/20/2015   ACCU-CHEK SOFTCLIX LANCETS lancets Reported on 06/20/2015   amLODipine 10 MG tablet Commonly known as:  NORVASC Take 1 tablet (10 mg total) by mouth daily.   aspirin 81 MG tablet Take 1 tablet (81 mg total) by mouth daily. Start taking 05/27/18 Start taking on:  May 27, 2018 What changed:    additional instructions  These instructions start on May 27, 2018. If you are unsure what to do until then, ask your doctor or other care provider.     atorvastatin 80 MG tablet Commonly known as:  LIPITOR Take 1 tablet (80 mg total) by mouth daily. What changed:  when to take this   B-D UF III MINI PEN NEEDLES 31G X 5 MM Misc Generic drug:  Insulin Pen Needle Reported on 06/20/2015   carvedilol 12.5 MG tablet Commonly known as:  COREG Take 1 tablet (12.5 mg total) by mouth 2 (two) times daily with a meal.   colchicine 0.6 MG tablet Take 1 tablet (0.6 mg total) by mouth 2 (two) times daily as needed. What changed:  reasons to take this   doxazosin 4 MG tablet Commonly known as:  CARDURA Take 1 tablet (4 mg total) by mouth at bedtime.   HYDROcodone-acetaminophen 5-325 MG tablet Commonly known as:  NORCO/VICODIN Take 1 tablet by mouth every 8 (eight) hours as needed. What changed:  reasons to take this   ketoconazole 2 % cream Commonly known as:  NIZORAL Apply 1 application topically daily. What  changed:    when to take this  reasons to take this   losartan 50 MG tablet Commonly known as:  COZAAR Take 1 tablet (50 mg total) by mouth daily.   metFORMIN 500 MG tablet Commonly known as:  GLUCOPHAGE Take 500 mg by mouth 2 (two) times daily with a meal.   multivitamin tablet Take 1 tablet by mouth daily.   OXYGEN Inhale into the lungs. 3L Nocturnal and PRN for Hypoxemia   pioglitazone 45 MG tablet Commonly known as:  ACTOS Take 45 mg by mouth daily.   polyethylene glycol packet Commonly known as:  MIRALAX / GLYCOLAX Take 17 g by mouth daily as needed for moderate constipation. If stool softner doesn't help you have a bowel movement   senna-docusate 8.6-50 MG tablet Commonly known as:  Senokot-S Take 2 tablets by mouth 2 (two) times daily. Skip the dose if you have loose watery stools            Durable Medical Equipment  (From admission, onward)         Start     Ordered   05/24/18 1418  For home use only DME oxygen  Once    Question Answer Comment  Mode or (Route) Nasal cannula   Liters per  Minute 5   Oxygen delivery system Gas      05/24/18 1417         No Known Allergies Follow-up Information    Colon Branch, MD. Call in 1 day(s).   Specialty:  Internal Medicine Why:  Please call for a post hospital follow-up appointment. Contact information: [address] [place] [address] [phone]        Carol Ada, MD. Call in 1 day(s).   Specialty:  Gastroenterology Why:  Please call for post hospital follow-up appointment. Contact information: [place], [address] [place] [address] [phone]        [facility] Follow up.   Specialty:  [place] Why:  Home Health RN, Physical Therapy, Occupational Therapy, aide-agency will call to arrange visits Contact information: [address] [place] [address] [phone]          The results of significant diagnostics from this hospitalization (including imaging, microbiology, ancillary and laboratory) are listed below for reference.    Significant Diagnostic Studies: Nm Gi Blood Loss  Result Date: 05/20/2018 CLINICAL DATA:  Recent GI bleeding, occurring early this morning. Patient had a colonoscopy on 05/18/2018. EXAM: NUCLEAR MEDICINE GASTROINTESTINAL BLEEDING SCAN TECHNIQUE: Sequential abdominal images were obtained following intravenous administration of Tc-77mlabeled red blood cells. RADIOPHARMACEUTICALS:  23.9 mCi Tc-929mertechnetate in-vitro labeled red cells. COMPARISON:  None. FINDINGS: There is no abnormal radiotracer accumulation to indicate a GI bleeding source. Physiologic uptake is noted. There is some uptake below the perineum consistent with urine leakage. IMPRESSION: 1. No evidence of a GI bleeding source. Electronically Signed   By: DaLajean Manes.D.   On: 05/20/2018 12:36   Dg Chest Port 1 View  Result Date: 05/22/2018 CLINICAL DATA:  Shortness of breath EXAM: PORTABLE CHEST 1 VIEW COMPARISON:  November 19, 2016 FINDINGS:  There is patchy airspace consolidation throughout portions of the left mid lower lung zones. There is also patchy infiltrate in the right base. Heart is mildly enlarged with pulmonary vascularity normal. No adenopathy. There is aortic atherosclerosis. No bone lesions. IMPRESSION: Patchy airspace opacity, most likely pneumonia in the left mid lower lung zones as well as  to a lesser extent in the right base. Mild cardiac prominence is stable. Aortic Atherosclerosis (ICD10-I70.0). Electronically Signed   By: Lowella Grip III M.D.   On: 05/22/2018 09:53    Microbiology: Recent Results (from the past 240 hour(s))  MRSA PCR Screening     Status: None   Collection Time: 05/20/18  4:40 AM  Result Value Ref Range Status   MRSA by PCR NEGATIVE NEGATIVE Final    Comment:        The GeneXpert MRSA Assay (FDA approved for NASAL specimens only), is one component of a comprehensive MRSA colonization surveillance program. It is not intended to diagnose MRSA infection nor to guide or monitor treatment for MRSA infections. Performed at [facility], [place] [address], [place], [place] [address]      Labs: Basic Metabolic Panel: Recent Labs  Lab 05/20/18 0624 05/21/18 0252 05/22/18 0524 05/23/18 0352 05/25/18 0310  NA 141 140 140 141 138  K 4.0 4.1 3.9 3.6 3.6  CL 111 110 105 101 100  CO2 '27 24 28 '$ 33* 30  GLUCOSE 124* 90 87 87 87  BUN '20 12 11 14 21  '$ CREATININE 1.42* 1.04 0.86 1.03 1.27*  CALCIUM 7.7* 7.7* 8.4* 8.3* 8.4*  MG  --   --  1.6* 1.7 1.8   Liver Function Tests: Recent Labs  Lab 05/20/18 0112 05/22/18 0524  AST 72* 40  ALT 27 26  ALKPHOS 45 45  BILITOT 0.8 1.2  PROT 6.3* 5.9*  ALBUMIN 3.5 3.3*   No results for input(s): LIPASE, AMYLASE in the last 168 hours. No results for input(s): AMMONIA in the last 168 hours. CBC: Recent Labs  Lab 05/21/18 0252 05/21/18 1055 05/22/18 0524 05/23/18 0352 05/23/18 1343 05/23/18 1911 05/24/18 0337  05/25/18 0310  WBC 6.0  --  6.0 6.1  --   --  6.4 6.7  NEUTROABS 3.2  --   --   --   --   --   --   --   HGB 7.4*  7.6* 7.7* 10.6* 10.6* 11.4* 11.3* 10.6* 10.4*  HCT 25.1*  26.1* 25.6* 34.5* 34.3*  --   --  34.1* 34.0*  MCV 104.4*  --  98.6 97.2  --   --  97.2 100.9*  PLT 143*  --  155 174  --   --  191 171   Cardiac Enzymes: No results for input(s): CKTOTAL, CKMB, CKMBINDEX, TROPONINI in the last 168 hours. BNP: BNP (last 3 results) No results for input(s): BNP in the last 8760 hours.  ProBNP (last 3 results) No results for input(s): PROBNP in the last 8760 hours.  CBG: Recent Labs  Lab 05/24/18 0907 05/24/18 1125 05/24/18 1643 05/24/18 2133 05/25/18 0730  GLUCAP 164* 140* 100* 215* 99     Signed:  Flora Lipps, MD [facility] 05/25/2018, 9:45 AM

## 2018-05-26 ENCOUNTER — Other Ambulatory Visit: Payer: Self-pay | Admitting: *Deleted

## 2018-05-26 ENCOUNTER — Telehealth: Payer: Self-pay | Admitting: *Deleted

## 2018-05-26 ENCOUNTER — Encounter: Payer: Self-pay | Admitting: Internal Medicine

## 2018-05-26 ENCOUNTER — Ambulatory Visit: Payer: Self-pay

## 2018-05-26 ENCOUNTER — Ambulatory Visit (HOSPITAL_BASED_OUTPATIENT_CLINIC_OR_DEPARTMENT_OTHER)
Admission: RE | Admit: 2018-05-26 | Discharge: 2018-05-26 | Disposition: A | Discharge status: Home / Self Care | Payer: Medicare PPO | Source: Ambulatory Visit | Attending: Internal Medicine | Admitting: Internal Medicine

## 2018-05-26 ENCOUNTER — Telehealth: Payer: Self-pay | Admitting: Internal Medicine

## 2018-05-26 ENCOUNTER — Ambulatory Visit: Payer: Medicare PPO | Admitting: Internal Medicine

## 2018-05-26 VITALS — BP 120/84 | HR 59 | Temp 98.2°F | Resp 20 | Ht 68.0 in | Wt 203.1 lb

## 2018-05-26 DIAGNOSIS — E119 Type 2 diabetes mellitus without complications: Secondary | ICD-10-CM | POA: Diagnosis not present

## 2018-05-26 DIAGNOSIS — Z7984 Long term (current) use of oral hypoglycemic drugs: Secondary | ICD-10-CM | POA: Diagnosis not present

## 2018-05-26 DIAGNOSIS — J439 Emphysema, unspecified: Secondary | ICD-10-CM

## 2018-05-26 DIAGNOSIS — M7989 Other specified soft tissue disorders: Secondary | ICD-10-CM

## 2018-05-26 DIAGNOSIS — R0902 Hypoxemia: Secondary | ICD-10-CM

## 2018-05-26 DIAGNOSIS — D62 Acute posthemorrhagic anemia: Secondary | ICD-10-CM | POA: Diagnosis not present

## 2018-05-26 DIAGNOSIS — K5791 Diverticulosis of intestine, part unspecified, without perforation or abscess with bleeding: Secondary | ICD-10-CM | POA: Diagnosis not present

## 2018-05-26 DIAGNOSIS — Z9981 Dependence on supplemental oxygen: Secondary | ICD-10-CM

## 2018-05-26 DIAGNOSIS — J8410 Pulmonary fibrosis, unspecified: Secondary | ICD-10-CM | POA: Diagnosis not present

## 2018-05-26 DIAGNOSIS — K5731 Diverticulosis of large intestine without perforation or abscess with bleeding: Secondary | ICD-10-CM | POA: Diagnosis not present

## 2018-05-26 DIAGNOSIS — I10 Essential (primary) hypertension: Secondary | ICD-10-CM | POA: Diagnosis not present

## 2018-05-26 DIAGNOSIS — J9621 Acute and chronic respiratory failure with hypoxia: Secondary | ICD-10-CM | POA: Diagnosis not present

## 2018-05-26 NOTE — Telephone Encounter (Signed)
Spoke w/ Chong Sicilian- informed that we have just seen Pt- informed that per record Pt has not seen pulm since 2018. We are placing an urgent referral. Informed that Pt should be at 5L continuously- okay for O2 to be in 80s per PCP- however if lower than 80s needs to go up further on O2. [name] also informed that Pt had lower BPs 106/70s this morning before meds. Informed her that in office BP was okay. I have placed an urgent referral back to Dr. Elsworth Soho and have tried to call Pt to see if that is who he indeed has been seeing or someone else since 2018- no answer on either number. I have asked PCP to call [facility] pulm to ask for urgent appt.

## 2018-05-26 NOTE — Progress Notes (Signed)
Subjective:    Patient ID: Bradley Johns, male    DOB: 1934-11-10, 84 y.o.   MRN: [id number]  DOS:  05/26/2018 Type of visit - description: TCM 7 Admitted to the hospital twice, eventually  discharge February 18. At the first admission, he had a GI bleed, had a colonoscopy, had a polyp removed. Dx was diverticular bleed and was d/c home but after 24 hours , he started to have again bright red blood per rectum and dark stools . On reexamination, hemoglobin decreased from 12 g to 9 g so the patient was readmitted. He has advanced COPD, pulmonary was consulted. The patient received 2 PRBCs, developed some SOB, was Rx lasix and improved.  GI recommended no further intervention. For hypoxia he needed up to 5 L of oxygen on ambulation.   Review of Systems He went home yesterday. Good medication compliance, holding aspirin. No fever chills No chest pain  Ambulatory BPs checked once and was okay. No lower extremity edema but had a gout episode, left ankle with some swelling.  Taking Colcrys and hydrocodone No nausea or vomiting.  No bowel movement in the last 24 hours.  No blood per rectum.  Mild cough at baseline. Shortness of breath at baseline.  Past Medical History:  Diagnosis Date  . Arthritis   . Benign prostatic hypertrophy   . COPD (chronic obstructive pulmonary disease) ([place])    24/7 O2  . Diabetes mellitus    per Dr Posey Pronto  . Epidermal cyst of face    left lower lateral cheek  . Gout   . Hyperlipidemia   . Hypertension   . Pulmonary fibrosis ([place]) 2014  . Sleep apnea     Past Surgical History:  Procedure Laterality Date  . COLONOSCOPY WITH PROPOFOL N/A 05/18/2018   Procedure: COLONOSCOPY WITH PROPOFOL;  Surgeon: Carol Ada, MD;  Location: [facility]COPY;  Service: Endoscopy;  Laterality: N/A;  . POLYPECTOMY  05/18/2018   Procedure: POLYPECTOMY;  Surgeon: Carol Ada, MD;  Location: [facility] ENDOSCOPY;  Service: Endoscopy;;  . TOE SURGERY      Social History    Socioeconomic History  . Marital status: Married    Spouse name: Not on file  . Number of children: 2  . Years of education: Not on file  . Highest education level: Not on file  Occupational History  . Occupation: retired  Scientific laboratory technician  . Financial resource strain: Not on file  . Food insecurity:    Worry: Not on file    Inability: Not on file  . Transportation needs:    Medical: Not on file    Non-medical: Not on file  Tobacco Use  . Smoking status: Former Smoker    Packs/day: 1.00    Years: 30.00    Pack years: 30.00    Types: Cigarettes    Last attempt to quit: 04/07/1996    Years since quitting: 22.1  . Smokeless tobacco: Never Used  Substance and Sexual Activity  . Alcohol use: Yes    Comment: occassional beer  . Drug use: No  . Sexual activity: Yes  Lifestyle  . Physical activity:    Days per week: Not on file    Minutes per session: Not on file  . Stress: Not on file  Relationships  . Social connections:    Talks on phone: Not on file    Gets together: Not on file    Attends religious service: Not on file    Active member of club  or organization: Not on file    Attends meetings of clubs or organizations: Not on file    Relationship status: Not on file  . Intimate partner violence:    Fear of current or ex partner: Not on file    Emotionally abused: Not on file    Physically abused: Not on file    Forced sexual activity: Not on file  Other Topics Concern  . Not on file  Social History Narrative   Lives w/ wife      Allergies as of 05/26/2018   No Known Allergies     Medication List       Accurate as of May 26, 2018 11:59 PM. Always use your most recent med list.        ACCU-CHEK AVIVA PLUS test strip Generic drug:  glucose blood Reported on 06/20/2015   ACCU-CHEK AVIVA PLUS w/Device Kit Reported on 06/20/2015   ACCU-CHEK SOFTCLIX LANCETS lancets Reported on 06/20/2015   amLODipine 10 MG tablet Commonly known as:  NORVASC Take 1  tablet (10 mg total) by mouth daily.   aspirin 81 MG tablet Take 1 tablet (81 mg total) by mouth daily. Start taking 05/27/18   atorvastatin 80 MG tablet Commonly known as:  LIPITOR Take 1 tablet (80 mg total) by mouth daily.   B-D UF III MINI PEN NEEDLES 31G X 5 MM Misc Generic drug:  Insulin Pen Needle Reported on 06/20/2015   carvedilol 12.5 MG tablet Commonly known as:  COREG Take 1 tablet (12.5 mg total) by mouth 2 (two) times daily with a meal.   colchicine 0.6 MG tablet Take 1 tablet (0.6 mg total) by mouth 2 (two) times daily as needed.   doxazosin 4 MG tablet Commonly known as:  CARDURA Take 1 tablet (4 mg total) by mouth at bedtime.   HYDROcodone-acetaminophen 5-325 MG tablet Commonly known as:  NORCO/VICODIN Take 1 tablet by mouth every 8 (eight) hours as needed.   ketoconazole 2 % cream Commonly known as:  NIZORAL Apply 1 application topically daily.   losartan 50 MG tablet Commonly known as:  COZAAR Take 1 tablet (50 mg total) by mouth daily.   metFORMIN 500 MG tablet Commonly known as:  GLUCOPHAGE Take 500 mg by mouth 2 (two) times daily with a meal.   multivitamin tablet Take 1 tablet by mouth daily.   OXYGEN Inhale into the lungs. 3L Nocturnal and PRN for Hypoxemia   pioglitazone 45 MG tablet Commonly known as:  ACTOS Take 45 mg by mouth daily.   polyethylene glycol packet Commonly known as:  MIRALAX / GLYCOLAX Take 17 g by mouth daily as needed for moderate constipation. If stool softner doesn't help you have a bowel movement   senna-docusate 8.6-50 MG tablet Commonly known as:  Senokot-S Take 2 tablets by mouth 2 (two) times daily. Skip the dose if you have loose watery stools           Objective:   Physical Exam BP 120/84 (BP Location: Left Arm, Patient Position: Sitting, Cuff Size: Normal)   Pulse (!) 59   Temp 98.2 F (36.8 C) (Oral)   Resp 20   Ht '5\' 8"'$  (1.727 m)   Wt 203 lb 2 oz (92.1 kg)   SpO2 92% Comment: on 5L  BMI  30.89 kg/m  General:   Well developed, NAD, BMI noted. HEENT:  Normocephalic . Face symmetric, atraumatic Lungs:  Decreased breath sounds.  No increased work of breathing Normal respiratory effort, no intercostal  retractions, no accessory muscle use. Heart: RRR,  no murmur.  Lower extremities: Calves are soft and nontender but  the left calf is about three quarters of a inch larger in circumference. + Mild swelling and tenderness around the left ankle. Abdomen: Exam performed while the patient was sitting, not tender. Neurologic:  alert & oriented X3.  Speech normal, gait appropriate for age and assisted by a cane. Psych--  Cognition and judgment appear intact.  Cooperative with normal attention span and concentration.  Behavior appropriate. No anxious or depressed appearing.      Assessment      Assessment  DM Dr. Posey Pronto  HTN Hyperlipidemia DJD-- rarely uses hydrocodone Gout -- occ colchicine PULM: --COPD on oxygen  24/7   --OSA - no compliant w/ cpap --Pulmonary fibrosis BPH (Nocturia), has not seen urology, last PSA DRE  2016 wnl, myrbetriq -no help    PLAN  Diverticular bleed, discharged from the hospital yesterday, seems to be doing okay, no further bleeding, no BM in the last 24 hours.  We will get a BMP and CBC in 2 days.  Red flag symptoms such as bleeding, pain, dizziness discussed with the patient, he will need to seek immediate attention if that is ever the case. He is holding aspirin, recommend to restart 05/28/2018. COPD, pulmonary fibrosis:  O2 sat today ambulating and on 3 L : 72%.  At rest, 5 L: 92%.  On hospital notes they noted that the patient was indeed requiring 5 L.   We contact pulmonary and asked for prompt follow-up.  Appreciate their help. Patient was told by his oxygen provider that they might need to change or remove the equipment, if that is the case he needs to contact me, he definitely needs oxygen. HTN: BP earlier today at home okay, BP at  this office visit okay.  Continue same medications. Left lower extremity edema: Likely related to gout which is already better on Colcrys and hydrocodone however they calf is also swollen, in the context of recent prolonged hospitalization concerned about DVT.  Will get ultrasound: no DVT , pt aware. RTC for labs 2 days RTC for office visit 4 to 5 weeks

## 2018-05-26 NOTE — Telephone Encounter (Signed)
No answer. Pt has appt scheduled with PCP today @120 

## 2018-05-26 NOTE — Patient Outreach (Signed)
[facility]) Care Management  05/26/2018  DAARON DIMARCO 01-Sep-1934 [id number]    RN attempted outreach call today and verified pt's identifiers and purpose of today's call however pt report his is "getting ready for a doctor's appointment". RN verified it is his hospital follow up appointments with his primary provider. RN offered to call back and pt agreed to a call back later in the day. Will follow later today as requested for pending [facility] services.   Raina Mina, RN Care Management Coordinator [place] Office [phone]

## 2018-05-26 NOTE — Progress Notes (Signed)
Pre visit review using our clinic review tool, if applicable. No additional management support is needed unless otherwise documented below in the visit note. 

## 2018-05-26 NOTE — Telephone Encounter (Signed)
Copied from [place] [id number]. Topic: Quick Communication - Home Health Verbal Orders >> May 26, 2018  2:08 PM Lennox Solders wrote: Caller/Agency: [facility]  Callback Number: [phone] Requesting Skilled Nursing  2x1 1x2 and home health aid 2x2  Frequency:

## 2018-05-26 NOTE — Telephone Encounter (Signed)
LMOM asking for [name] to return call.

## 2018-05-26 NOTE — Telephone Encounter (Signed)
[name] RN from [facility] called to report pt's O2 sat with walking on 4 LPM O2 was 79%. BP 110/50 sitting and 106/40 standing. Chong Sicilian is not with pt and stated he may be at his appointment. Chong Sicilian was wanting to know how much O2 is he supposed to be on?  Transferred call to office .  Reason for Disposition . [1] Caller requesting NON-URGENT health information AND [2] PCP's office is the best resource  Answer Assessment - Initial Assessment Questions 1. REASON FOR CALL or QUESTION: "What is your reason for calling today?" or "How can I best help you?" or "What question do you have that I can help answer?"      [name] RN from [facility] called to report pt's O2 sat with walking on 4 LPM O2 was 79%. BP 110/50 sitting and 106/40 standing. Chong Sicilian is not with pt and stated he may be at his appointment. Chong Sicilian was wanting to know how much O2 is he supposed to be on?  Transferred call to office .  Protocols used: INFORMATION ONLY CALL-A-AH

## 2018-05-26 NOTE — Telephone Encounter (Signed)
thx

## 2018-05-26 NOTE — Telephone Encounter (Signed)
LMOM w/ verbals for [name].

## 2018-05-26 NOTE — Patient Outreach (Signed)
[name]) Care Management  05/26/2018  Bradley Johns 1934/12/22 [id number]   RN returned the requested call however pt requested a call back tomorrow afternoon would be better. Will follow up once again tomorrow to inquired further on [facility] pending services.   Raina Mina, RN Care Management Coordinator [place] Office [phone]

## 2018-05-26 NOTE — Patient Instructions (Signed)
GO TO THE FRONT DESK Schedule labs to be done on Friday.  Schedule your next appointment to see me in 4 to 5 weeks  Please continue working with the pulmonary doctor regards her oxygen   STOP BY THE FIRST FLOOR:  get the ultrasound of your left leg    Restart aspirin on Friday

## 2018-05-27 ENCOUNTER — Other Ambulatory Visit: Payer: Self-pay | Admitting: *Deleted

## 2018-05-27 ENCOUNTER — Encounter: Payer: Self-pay | Admitting: *Deleted

## 2018-05-27 DIAGNOSIS — Z7984 Long term (current) use of oral hypoglycemic drugs: Secondary | ICD-10-CM | POA: Diagnosis not present

## 2018-05-27 DIAGNOSIS — K5791 Diverticulosis of intestine, part unspecified, without perforation or abscess with bleeding: Secondary | ICD-10-CM | POA: Diagnosis not present

## 2018-05-27 DIAGNOSIS — J9621 Acute and chronic respiratory failure with hypoxia: Secondary | ICD-10-CM | POA: Diagnosis not present

## 2018-05-27 DIAGNOSIS — Z9981 Dependence on supplemental oxygen: Secondary | ICD-10-CM | POA: Diagnosis not present

## 2018-05-27 DIAGNOSIS — D62 Acute posthemorrhagic anemia: Secondary | ICD-10-CM | POA: Diagnosis not present

## 2018-05-27 DIAGNOSIS — I10 Essential (primary) hypertension: Secondary | ICD-10-CM | POA: Diagnosis not present

## 2018-05-27 DIAGNOSIS — J8410 Pulmonary fibrosis, unspecified: Secondary | ICD-10-CM | POA: Diagnosis not present

## 2018-05-27 DIAGNOSIS — J439 Emphysema, unspecified: Secondary | ICD-10-CM | POA: Diagnosis not present

## 2018-05-27 DIAGNOSIS — E119 Type 2 diabetes mellitus without complications: Secondary | ICD-10-CM | POA: Diagnosis not present

## 2018-05-27 NOTE — Telephone Encounter (Signed)
LMOM for Bradley Johns to return call. Okay for [facility] to discuss.

## 2018-05-27 NOTE — Patient Outreach (Signed)
[facility]) Care Management  05/27/2018  Bradley Johns 06-Nov-1934 [id number]   Referral received 2/18 post hospital discharge Provider's office to completed the Transition of care  Telephone Assessment  RN contacted pt today as requested and further discussed [facility] services and again the purpose for today's call. Pt receptive and remembers talking to the hospital liaison but reports several other services are involved with his ongoing care. States PT with the [facility] visited today and someone named Ilona Sorrel will be visiting tomorrow. Pt states he is a bit overwhelmed but trying to keep up with all the involved services. RN offered to assist by offering to send pt a [facility] calendar to help organize with all his appointments and involved services (pt receptive). RN further inquired on any other medical condition pt feels he needs assistance with at this time. Inquired on pt's COPD as pt indicated this is under control as long has he uses his home O2 which is currently on 3 liters. Reported his primary provider manages is COPD medications. Pt also states he has a endocrinologist that follows his diabetes and all his readings are good (controlled). RN offered to further education on preventions measures due to his recent admission for anemia (bleeding). Pt receptive as RN discussed precautionary measures to prevent readmission. Strongly encouraged pt on early interventions to prevent hospitalization. Pt is aware when to call 911 verses his provider's office if he encounters another bleed episode. Discussed a plan of care related to such prevention measures, adherence with all his prescribed medications (discussed and educated on all medications today)and attendance to all his medical appointments (verified all prior to upcoming appointments). Pt has agreed to the goals and interventions over the next few months and receptive to the referral that will be placed to further assist pt with  managing his ongoing needs. No other resources needed at this time as pt aware his primary provider will be notify of his disposition with [facility] services and his participation in the program. RN completed the initial assessment and will update this plan of care in a few weeks based upon pt's progress. The following as discussed as the plan of care:  [facility] CM Care Plan Problem One     Most Recent Value  Care Plan Problem One  Recent hospitalization for anemia  Role Documenting the Problem One  Care Management [place] for Problem One  Active  [facility] Long Term Goal   Pt will not have any hospitalization over the next 90 days related to anemia  [facility] Long Term Goal Start Date  05/27/18  Interventions for Problem One Long Term Goal  Will educate and discuss anemia and possible prevention measures with early interventions. Will stress the importance of reporting any possible side affects related to anemia with dizziness or weakness to her provider. Pt is aware when to visit the ED if acute symptoms should occur.   [facility] CM Short Term Goal #1   Adherence with all medical appointment post discharge over the next 30 days.  [facility] CM Short Term Goal #1 Start Date  05/27/18  Interventions for Short Term Goal #1  Will strongly encouraged attendance to all medical appointments and verify pt has sufficient transportation services. Offered another sources of transportation and office to send SCAT application if future usage if needed.   [facility] CM Short Term Goal #2   Adherence with medication post discharge over the next 30 days.  [facility] CM Short Term Goal #2 Start Date  05/27/18  Interventions for Short Term Goal #2  Will review and discuss all medications and verify pt has a sufficient supple. Will encourage adherence and offer any assistance with medication procurement    Raina Mina, RN Care Management Coordinator [place] Office [phone]

## 2018-05-27 NOTE — Telephone Encounter (Signed)
[name] with [facility] called and stated that she would like a call back from the nurse regarding patient. Please call back [phone]

## 2018-05-27 NOTE — Assessment & Plan Note (Signed)
Diverticular bleed, discharged from the hospital yesterday, seems to be doing okay, no further bleeding, no BM in the last 24 hours.  We will get a BMP and CBC in 2 days.  Red flag symptoms such as bleeding, pain, dizziness discussed with the patient, he will need to seek immediate attention if that is ever the case. He is holding aspirin, recommend to restart 05/28/2018. COPD, pulmonary fibrosis:  O2 sat today ambulating and on 3 L : 72%.  At rest, 5 L: 92%.  On hospital notes they noted that the patient was indeed requiring 5 L.   We contact pulmonary and asked for prompt follow-up.  Appreciate their help. Patient was told by his oxygen provider that they might need to change or remove the equipment, if that is the case he needs to contact me, he definitely needs oxygen. HTN: BP earlier today at home okay, BP at this office visit okay.  Continue same medications. Left lower extremity edema: Likely related to gout which is already better on Colcrys and hydrocodone however they calf is also swollen, in the context of recent prolonged hospitalization concerned about DVT.  Will get ultrasound: no DVT , pt aware. RTC for labs 2 days RTC for office visit 4 to 5 weeks

## 2018-05-27 NOTE — Telephone Encounter (Signed)
I spoke with Dr. Elsworth Soho, they will see him in a timely manner, appreciate his help.

## 2018-05-27 NOTE — Telephone Encounter (Signed)
We discussed several issues including interaction between carvedilol and colchicine, recommend to stop colchicine ASAP. They also recommend a aid for the next 3 weeks, I agree, verbal order provided. As far as the oxygen, he needs to be at least the high 80s, okay to use 5 L of oxygen.  If unable to get her oxygen in the high 80s might need to go to the ER.

## 2018-05-28 ENCOUNTER — Telehealth: Payer: Self-pay | Admitting: Internal Medicine

## 2018-05-28 DIAGNOSIS — Z7984 Long term (current) use of oral hypoglycemic drugs: Secondary | ICD-10-CM | POA: Diagnosis not present

## 2018-05-28 DIAGNOSIS — Z9981 Dependence on supplemental oxygen: Secondary | ICD-10-CM | POA: Diagnosis not present

## 2018-05-28 DIAGNOSIS — J9621 Acute and chronic respiratory failure with hypoxia: Secondary | ICD-10-CM | POA: Diagnosis not present

## 2018-05-28 DIAGNOSIS — J439 Emphysema, unspecified: Secondary | ICD-10-CM | POA: Diagnosis not present

## 2018-05-28 DIAGNOSIS — I10 Essential (primary) hypertension: Secondary | ICD-10-CM | POA: Diagnosis not present

## 2018-05-28 DIAGNOSIS — K5791 Diverticulosis of intestine, part unspecified, without perforation or abscess with bleeding: Secondary | ICD-10-CM | POA: Diagnosis not present

## 2018-05-28 DIAGNOSIS — D62 Acute posthemorrhagic anemia: Secondary | ICD-10-CM | POA: Diagnosis not present

## 2018-05-28 DIAGNOSIS — E119 Type 2 diabetes mellitus without complications: Secondary | ICD-10-CM | POA: Diagnosis not present

## 2018-05-28 DIAGNOSIS — J8410 Pulmonary fibrosis, unspecified: Secondary | ICD-10-CM | POA: Diagnosis not present

## 2018-05-28 NOTE — Telephone Encounter (Addendum)
I spoke with the patient and his wife. He is doing okay. O2 sat in the 90s, he was provided with a bigger concentrator to use at home, still having issues with small portable unit. Had a BM yesterday, no blood in the stools. He supposed to come here for blood work today, he has a number of people calling him and visiting him (PT, RNs, etc.).  Also we had snow yesterday. We agree that if he cannot make it today for blood work he will try early next week. Appointment with pulmonology pending, encouraged to call them.

## 2018-05-31 ENCOUNTER — Telehealth: Payer: Self-pay | Admitting: *Deleted

## 2018-05-31 ENCOUNTER — Telehealth: Payer: Self-pay | Admitting: Pharmacist

## 2018-05-31 ENCOUNTER — Other Ambulatory Visit (INDEPENDENT_AMBULATORY_CARE_PROVIDER_SITE_OTHER): Payer: Medicare PPO

## 2018-05-31 DIAGNOSIS — D62 Acute posthemorrhagic anemia: Secondary | ICD-10-CM

## 2018-05-31 NOTE — Telephone Encounter (Signed)
Received Physician Orders from [facility]; forwarded to provider/SLS 02/24

## 2018-05-31 NOTE — Patient Outreach (Signed)
[facility]) Care Management  [place]   05/31/2018  BILAL MANZER 04/26/1934 [id number]  Reason for referral: Medication Management  Referral source: [facility] RN Current insurance:[employer]  PMHx includes but not limited to:  Anemia, hypertension, osteoarthritis, pulmonary fibrosis, COPD with Emphysema, type 2 diabetes, gout, and  BPH.  Outreach:  Successful telephone call with patient.  HIPAA identifiers verified.   Subjective:  Patient is an 84 year old male with the a medical history as listed above who was recently re-hospitalized for rectal bleeding. He was previously hospitalized for GI bleed a few days before the most recent hospitalization.    Objective: Lab Results  Component Value Date   CREATININE 1.27 (H) 05/25/2018   CREATININE 1.03 05/23/2018   CREATININE 0.86 05/22/2018    Lab Results  Component Value Date   HGBA1C 6.9 (H) 05/21/2018    Lipid Panel     Component Value Date/Time   CHOL 135 03/24/2018 1149   TRIG 119.0 03/24/2018 1149   TRIG 122 03/03/2006 1011   HDL 46.30 03/24/2018 1149   CHOLHDL 3 03/24/2018 1149   VLDL 23.8 03/24/2018 1149   LDLCALC 65 03/24/2018 1149   LDLDIRECT 117.2 09/17/2011 0948    BP Readings from Last 3 Encounters:  05/26/18 120/84  05/25/18 (!) 146/52  05/18/18 140/62    No Known Allergies  Medications Reviewed Today    Reviewed by Tobi Bastos, RN (Registered Nurse) on 05/27/18 at 1634  Med List Status: <None>  Medication Order Taking? Sig Documenting Provider Last Dose Status Informant  ACCU-CHEK AVIVA PLUS test strip [id number] Yes Reported on 06/20/2015 [provider] Taking Active Self           Med Note Alinda Money, Cheri Rous   Fri Dec 21, 2015  2:04 PM)    Ermalene Postin LANCETS lancets [id number] Yes Reported on 06/20/2015 [provider] Taking Active Self           Med Note (CANTER, Cheri Rous   Fri Dec 21, 2015  2:04 PM)    amLODipine (NORVASC) 10 MG tablet  [id number] Yes Take 1 tablet (10 mg total) by mouth daily. Colon Branch, MD Taking Active Self  aspirin 81 MG tablet [id number] Yes Take 1 tablet (81 mg total) by mouth daily. Start taking 05/27/18 Pokhrel, Corrie Mckusick, MD Taking Active   atorvastatin (LIPITOR) 80 MG tablet [id number] Yes Take 1 tablet (80 mg total) by mouth daily.  Patient taking differently:  Take 80 mg by mouth every evening.    Colon Branch, MD Taking Active Self  B-D UF III MINI PEN NEEDLES 31G X 5 MM MISC [id number] Yes Reported on 06/20/2015 [provider] Taking Active Self           Med Note Alinda Money, Cheri Rous   Fri Dec 21, 2015  2:04 PM)    Blood Glucose Monitoring Suppl (ACCU-CHEK AVIVA PLUS) W/DEVICE Drucie Opitz [id number] Yes Reported on 06/20/2015 [provider] Taking Active Self           Med Note (CANTER, Cheri Rous   Fri Dec 21, 2015  2:04 PM)    carvedilol (COREG) 12.5 MG tablet [id number] Yes Take 1 tablet (12.5 mg total) by mouth 2 (two) times daily with a meal. Colon Branch, MD Taking Active Self  colchicine 0.6 MG tablet [id number] Yes Take 1 tablet (0.6 mg total) by mouth 2 (two) times daily as needed.  Patient taking differently:  Take 0.6 mg by mouth  2 (two) times daily as needed (gout flair).    Colon Branch, MD Taking Active Self  doxazosin (CARDURA) 4 MG tablet [id number] Yes Take 1 tablet (4 mg total) by mouth at bedtime. Colon Branch, MD Taking Active Self  HYDROcodone-acetaminophen (NORCO/VICODIN) 5-325 MG tablet [id number] Yes Take 1 tablet by mouth every 8 (eight) hours as needed.  Patient taking differently:  Take 1 tablet by mouth every 8 (eight) hours as needed for moderate pain.    Colon Branch, MD Taking Active Self  ketoconazole (NIZORAL) 2 % cream [id number] Yes Apply 1 application topically daily.  Patient taking differently:  Apply 1 application topically daily as needed (moisture around groin).    Colon Branch, MD Taking Active Self  losartan (COZAAR) 50 MG tablet [id number] Yes Take 1 tablet (50 mg  total) by mouth daily. Colon Branch, MD Taking Active Self  metFORMIN (GLUCOPHAGE) 500 MG tablet [id number] Yes Take 500 mg by mouth 2 (two) times daily with a meal.   [provider] Taking Active Self  Multiple Vitamin (MULTIVITAMIN) tablet [id number] Yes Take 1 tablet by mouth daily.   [provider] Taking Active Self  OXYGEN [id number] Yes Inhale into the lungs. 3L Nocturnal and PRN for Hypoxemia [provider] Taking Active Self  pioglitazone (ACTOS) 45 MG tablet [id number] Yes Take 45 mg by mouth daily. [provider] Taking Active Self  polyethylene glycol (MIRALAX / GLYCOLAX) packet [id number] Yes Take 17 g by mouth daily as needed for moderate constipation. If stool softner doesn't help you have a bowel movement Pokhrel, Laxman, MD Taking Active   senna-docusate (SENOKOT-S) 8.6-50 MG tablet [id number] Yes Take 2 tablets by mouth 2 (two) times daily. Skip the dose if you have loose watery stools Pokhrel, Laxman, MD Taking Active           Assessment: ASSESSMENT: Date Discharged from Hospital: 05/26/2018 Date Medication Reconciliation Performed: 05/31/2018  Medications:  New at Discharge: polyethylene glycol (MIRALAX / GLYCOLAX) senna-docusate (Senokot-S)  Adjustments at Discharge: . ASA '81mg'$  (restarted on 05/28/18)  Patient was recently discharged from hospital and all medications have been reviewed.   Drugs sorted by system:  Cardiovascular: Amlodipine, Aspirin, Atorvastatin, Carvedilol, Doxazosin, Losartan  Pulmonary/Allergy: Oxygen  Gastrointestinal: Senna-Docusate, Polyethylene Glycol   Endocrine: Colchicine, Metformin, Pioglitazone   Topical: Ketoconazole cream  Pain: Hydrocodone/APAP,   Vitamins/Minerals: Multiple Vitamin  Duplications in therapy:   Gaps in therapy:  Medications to avoid in the elderly:  Drug interactions:  Other issues noted:    Medication Review Findings:  . Combination of colchicine and  atorvastatin may increase risk of myopathy. However, patient only takes colchicine PRN.   Medication Assistance Findings:  No medication assistance needs identified  Extra Help:   '[]'$  Already receiving Full Extra Help  '[]'$  Already receiving Partial Extra Help  '[]'$  Eligible based on reported income and assets  '[x]'$  Not Eligible based on reported income and assets   Patient gets all of his chronic medications filled at [facility]. He is on all tier 1-2 medications with the exception of Hydrocodone so he does not have a copay for the majority of his medications.  After reviewing the patient's medications, he confirmed understanding his medication regimen.    Plan: Patient's medications were reviewed post discharge.   Pharmacy case will be closed as patient expressed frustration about the number of calls he has been receiving and because he did not express any current pharmacy needs.  Patient's case  can be reopened in the future for medication related questions or concerns.  Elayne Guerin, PharmD, [place] [facility] Clinical Pharmacist [phone]

## 2018-06-01 ENCOUNTER — Telehealth: Payer: Self-pay | Admitting: Internal Medicine

## 2018-06-01 DIAGNOSIS — D62 Acute posthemorrhagic anemia: Secondary | ICD-10-CM | POA: Diagnosis not present

## 2018-06-01 DIAGNOSIS — I10 Essential (primary) hypertension: Secondary | ICD-10-CM | POA: Diagnosis not present

## 2018-06-01 DIAGNOSIS — Z9981 Dependence on supplemental oxygen: Secondary | ICD-10-CM | POA: Diagnosis not present

## 2018-06-01 DIAGNOSIS — J439 Emphysema, unspecified: Secondary | ICD-10-CM | POA: Diagnosis not present

## 2018-06-01 DIAGNOSIS — Z7984 Long term (current) use of oral hypoglycemic drugs: Secondary | ICD-10-CM | POA: Diagnosis not present

## 2018-06-01 DIAGNOSIS — K5791 Diverticulosis of intestine, part unspecified, without perforation or abscess with bleeding: Secondary | ICD-10-CM | POA: Diagnosis not present

## 2018-06-01 DIAGNOSIS — J8410 Pulmonary fibrosis, unspecified: Secondary | ICD-10-CM | POA: Diagnosis not present

## 2018-06-01 DIAGNOSIS — E119 Type 2 diabetes mellitus without complications: Secondary | ICD-10-CM | POA: Diagnosis not present

## 2018-06-01 DIAGNOSIS — J9621 Acute and chronic respiratory failure with hypoxia: Secondary | ICD-10-CM | POA: Diagnosis not present

## 2018-06-01 LAB — CBC WITH DIFFERENTIAL/PLATELET
Basophils Absolute: 0 10*3/uL (ref 0.0–0.1)
Basophils Relative: 0.7 % (ref 0.0–3.0)
Eosinophils Absolute: 0.2 10*3/uL (ref 0.0–0.7)
Eosinophils Relative: 3.6 % (ref 0.0–5.0)
HCT: 35.9 % — ABNORMAL LOW (ref 39.0–52.0)
Hemoglobin: 11.7 g/dL — ABNORMAL LOW (ref 13.0–17.0)
Lymphocytes Relative: 25.3 % (ref 12.0–46.0)
Lymphs Abs: 1.6 10*3/uL (ref 0.7–4.0)
MCHC: 32.7 g/dL (ref 30.0–36.0)
MCV: 94.4 fl (ref 78.0–100.0)
Monocytes Absolute: 0.7 10*3/uL (ref 0.1–1.0)
Monocytes Relative: 10.4 % (ref 3.0–12.0)
Neutro Abs: 3.8 10*3/uL (ref 1.4–7.7)
Neutrophils Relative %: 60 % (ref 43.0–77.0)
Platelets: 239 10*3/uL (ref 150.0–400.0)
RBC: 3.8 Mil/uL — ABNORMAL LOW (ref 4.22–5.81)
RDW: 15.3 % (ref 11.5–15.5)
WBC: 6.3 10*3/uL (ref 4.0–10.5)

## 2018-06-01 LAB — BASIC METABOLIC PANEL
BUN: 14 mg/dL (ref 6–23)
CO2: 32 mEq/L (ref 19–32)
Calcium: 9.7 mg/dL (ref 8.4–10.5)
Chloride: 101 mEq/L (ref 96–112)
Creatinine, Ser: 1.09 mg/dL (ref 0.40–1.50)
GFR: 78.43 mL/min (ref >60.00)
Glucose, Bld: 131 mg/dL — ABNORMAL HIGH (ref 70–99)
Potassium: 5.2 mEq/L — ABNORMAL HIGH (ref 3.5–5.1)
Sodium: 142 mEq/L (ref 135–145)

## 2018-06-01 NOTE — Telephone Encounter (Signed)
Noted. He has well known hypoxia without oxygen supplements.

## 2018-06-01 NOTE — Telephone Encounter (Signed)
Copied from [place] [id number]. Topic: Quick Communication - Home Health Verbal Orders >> Jun 01, 2018  4:03 PM Percell Belt A wrote: Caller/Agency: [name]  Callback Number: [phone] Pt no longer wants the home health aid.   [id number] O2 dropped to 77% without oxygen

## 2018-06-01 NOTE — Telephone Encounter (Signed)
Appt w/ Dr. Elsworth Soho scheduled on 06/14/2018.

## 2018-06-01 NOTE — Telephone Encounter (Signed)
FYI

## 2018-06-02 ENCOUNTER — Other Ambulatory Visit: Payer: Self-pay

## 2018-06-02 NOTE — Patient Outreach (Signed)
[facility]) Care Management  06/02/2018  Bradley Johns [birth year] [id number]   Successful outreach to [name] regarding social work referral for transportation resources.  BSW and [name] discussed multiple transportation options.  He does not have transportation benefits through [facility] and he is outside of the [place] area.  He is interested in applying for Access ADA but this service will only transport within [facility] and he has an upcoming pulmonology appointment in [place] on 06/14/18.  BSW submitted a referral to [facility] and [employer] which should be able to provide transport from [place] to [place].  BSW arranged transportation via [place] to appointment on 06/14/18 because TAMS application will not be processed by then.  BSW mailed application for Access ADA.  BSW will follow up with [name] within the next two weeks to ensure receipt of application and to get update on eligibility for TAMS.  Ronn Melena, BSW Social Worker [phone]

## 2018-06-03 ENCOUNTER — Ambulatory Visit: Payer: Medicare PPO | Admitting: Pulmonary Disease

## 2018-06-03 ENCOUNTER — Encounter: Payer: Self-pay | Admitting: Pulmonary Disease

## 2018-06-03 VITALS — BP 138/70 | HR 78 | Ht 68.0 in | Wt 203.8 lb

## 2018-06-03 DIAGNOSIS — J9611 Chronic respiratory failure with hypoxia: Secondary | ICD-10-CM | POA: Diagnosis not present

## 2018-06-03 DIAGNOSIS — J84112 Idiopathic pulmonary fibrosis: Secondary | ICD-10-CM

## 2018-06-03 DIAGNOSIS — J849 Interstitial pulmonary disease, unspecified: Secondary | ICD-10-CM | POA: Diagnosis not present

## 2018-06-03 DIAGNOSIS — J432 Centrilobular emphysema: Secondary | ICD-10-CM

## 2018-06-03 MED ORDER — UMECLIDINIUM-VILANTEROL 62.5-25 MCG/INH IN AEPB: 1.0000 | INHALATION_SPRAY | Freq: Every day | RESPIRATORY_TRACT | 0 refills | Status: DC

## 2018-06-03 NOTE — Patient Instructions (Addendum)
Schedule high-resolution CT chest at [facility] Schedule PFTs. Based on these tests, we will decide about starting new medication O FEV  Trial of ANORO once daily- call me for Rx if thisworks We will try to get you portable oxygen

## 2018-06-03 NOTE — Telephone Encounter (Signed)
Multiple orders signed and faxed to [facility] at [phone]. Forms sent for scanning.   Received letter of medical necessity for O2 from [place]. Form signed and faxed to [phone]. Form sent for scanning.

## 2018-06-03 NOTE — Assessment & Plan Note (Addendum)
Feel that IPF has worsened over the last 4 years  Schedule high-resolution CT chest at [facility] Schedule PFTs. Based on these tests, we will decide about starting new medication O FEV

## 2018-06-03 NOTE — Progress Notes (Signed)
Subjective:    Patient ID: Bradley Johns, male    DOB: 21-Jan-1935, 84 y.o.   MRN: [id number]  HPI   84 yo remote smoker with known emphysema and interstitial lung disease presents to re-establish care  He was last seen 04/2014 and on that visit was given a sample of Spiriva.  I also discussed medications for pulmonary fibrosis but he was very suspicious of side effects and was not interested.  ILD changes dating back to 2014 at least.  He was poorly compliant with oxygen He was diagnosed with obstructive sleep apnea but is not compliant with CPAP   On his PCP visit on 2/19 he was found to be very hypoxic to 72% on 3 L and required 5 L to get up to 92%.  His wife states that he is not very compliant with oxygen and will often leave it in the car when he goes out and comes back huffing and puffing. He states that he prefers portable oxygen he was hospitalized 05/2018 for diverticular bleed and for some reason "they took my portable oxygen away" and gave him large oxygen tanks which has really impacted his quality of life  He smoked about a pack per day until he quit in 1998. He is a retired Librarian, academic for [employer]. He was born in [place] lived in [place] for 20 years and moved to [place] in [place].   LFTs nml 05/22/18  Significant tests/ events  Chest x-ray 10/11/12 when compared to 2006 showed hyperinflation and bibasal interstitial markings that were progressive from prior.   PFTs 12/2012- no airway obstruction with FEV1 of 67% and FVC of 65% post bronchodilator with 12 percent bronchodilator response. There was severe intraparenchymal restriction with TLC of 60% and DLCO of 32%.   HRCT- 12/2012 -Severe central lobular emphysema with basilar fibrosis and some honeycombing.  ESR 6, ANA, RA factor, CCP neg   09/2013 Completed rehab - O2 to continuous 3-6 L/M during exercise 6MW improved 25% from 1320 ft to 1650 ft  PFTs 04/2014 >> unchanged, FEV1 60%, FVC 59%, 12%  bronchodilator response, TLC 64%, DLCO slight drop to 27%   Past Medical History:  Diagnosis Date  . Arthritis   . Benign prostatic hypertrophy   . COPD (chronic obstructive pulmonary disease) ([place])    24/7 O2  . Diabetes mellitus    per Dr Posey Pronto  . Epidermal cyst of face    left lower lateral cheek  . Gout   . Hyperlipidemia   . Hypertension   . Pulmonary fibrosis ([place]) 2014  . Sleep apnea      Past Surgical History:  Procedure Laterality Date  . COLONOSCOPY WITH PROPOFOL N/A 05/18/2018   Procedure: COLONOSCOPY WITH PROPOFOL;  Surgeon: Carol Ada, MD;  Location: [facility] ENDOSCOPY;  Service: Endoscopy;  Laterality: N/A;  . POLYPECTOMY  05/18/2018   Procedure: POLYPECTOMY;  Surgeon: Carol Ada, MD;  Location: [facility] ENDOSCOPY;  Service: Endoscopy;;  . TOE SURGERY      No Known Allergies  Social History   Socioeconomic History  . Marital status: Married    Spouse name: Not on file  . Number of children: 2  . Years of education: Not on file  . Highest education level: Not on file  Occupational History  . Occupation: retired  Scientific laboratory technician  . Financial resource strain: Not on file  . Food insecurity:    Worry: Not on file    Inability: Not on file  . Transportation  needs:    Medical: Not on file    Non-medical: Not on file  Tobacco Use  . Smoking status: Former Smoker    Packs/day: 1.00    Years: 30.00    Pack years: 30.00    Types: Cigarettes    Last attempt to quit: 04/07/1996    Years since quitting: 22.1  . Smokeless tobacco: Never Used  Substance and Sexual Activity  . Alcohol use: Yes    Comment: occassional beer  . Drug use: No  . Sexual activity: Yes  Lifestyle  . Physical activity:    Days per week: Not on file    Minutes per session: Not on file  . Stress: Not on file  Relationships  . Social connections:    Talks on phone: Not on file    Gets together: Not on file    Attends religious service: Not on file    Active member of club or  organization: Not on file    Attends meetings of clubs or organizations: Not on file    Relationship status: Not on file  . Intimate partner violence:    Fear of current or ex partner: Not on file    Emotionally abused: Not on file    Physically abused: Not on file    Forced sexual activity: Not on file  Other Topics Concern  . Not on file  Social History Narrative   Lives w/ wife    Family History  Problem Relation Age of Onset  . Liver disease Mother   . Coronary artery disease Father 52  . Diabetes Sister   . Cancer Brother        type?  Marland Kitchen Hypertension Neg Hx   . Hyperlipidemia Neg Hx   . Heart attack Neg Hx   . Prostate cancer Neg Hx   . Colon cancer Neg Hx      Review of Systems Positive for DOE, indigestion, feet swelling   Constitutional: negative for anorexia, fevers and sweats  Eyes: negative for irritation, redness and visual disturbance  Ears, nose, mouth, throat, and face: negative for earaches, epistaxis, nasal congestion and sore throat  Respiratory: negative for cough,  sputum and wheezing  Cardiovascular: negative for chest pain, , orthopnea, palpitations and syncope  Gastrointestinal: negative for abdominal pain, constipation, diarrhea, melena, nausea and vomiting  Genitourinary:negative for dysuria, frequency and hematuria  Hematologic/lymphatic: negative for bleeding, easy bruising and lymphadenopathy  Musculoskeletal:negative for arthralgias, muscle weakness and stiff joints  Neurological: negative for coordination problems, gait problems, headaches and weakness  Endocrine: negative for diabetic symptoms including polydipsia, polyuria and weight loss     Objective:   Physical Exam  Gen. Pleasant, well-nourished, in no distress, normal affect ENT - no pallor,icterus, no post nasal drip Neck: No JVD, no thyromegaly, no carotid bruits Lungs: no use of accessory muscles, no dullness to percussion, bibasal 1/3 rales no rhonchi  Cardiovascular:  Rhythm regular, heart sounds  normal, no murmurs or gallops, no peripheral edema Abdomen: soft and non-tender, no hepatosplenomegaly, BS normal. Musculoskeletal: No deformities, no cyanosis or clubbing Neuro:  alert, non focal       Assessment & Plan:

## 2018-06-03 NOTE — Assessment & Plan Note (Signed)
Trial off Anoro

## 2018-06-03 NOTE — Assessment & Plan Note (Signed)
At rest 3 L continuous oxygen seems to be an output on ambulation his oxygen saturation dropped to 83% on 5 L portable.  Hence portable oxygen is not going to be enough for him I discussed this with him.  He will need 4 to 5 L continuous on ambulation

## 2018-06-04 DIAGNOSIS — J449 Chronic obstructive pulmonary disease, unspecified: Secondary | ICD-10-CM | POA: Diagnosis not present

## 2018-06-05 DIAGNOSIS — J449 Chronic obstructive pulmonary disease, unspecified: Secondary | ICD-10-CM | POA: Diagnosis not present

## 2018-06-09 ENCOUNTER — Ambulatory Visit (HOSPITAL_BASED_OUTPATIENT_CLINIC_OR_DEPARTMENT_OTHER)
Admission: RE | Admit: 2018-06-09 | Discharge: 2018-06-09 | Disposition: A | Discharge status: Home / Self Care | Payer: Medicare PPO | Source: Ambulatory Visit | Attending: Pulmonary Disease | Admitting: Pulmonary Disease

## 2018-06-09 DIAGNOSIS — J849 Interstitial pulmonary disease, unspecified: Secondary | ICD-10-CM | POA: Insufficient documentation

## 2018-06-09 DIAGNOSIS — J432 Centrilobular emphysema: Secondary | ICD-10-CM | POA: Diagnosis not present

## 2018-06-10 DIAGNOSIS — J8410 Pulmonary fibrosis, unspecified: Secondary | ICD-10-CM | POA: Diagnosis not present

## 2018-06-10 DIAGNOSIS — Z9981 Dependence on supplemental oxygen: Secondary | ICD-10-CM | POA: Diagnosis not present

## 2018-06-10 DIAGNOSIS — D62 Acute posthemorrhagic anemia: Secondary | ICD-10-CM | POA: Diagnosis not present

## 2018-06-10 DIAGNOSIS — Z7984 Long term (current) use of oral hypoglycemic drugs: Secondary | ICD-10-CM | POA: Diagnosis not present

## 2018-06-10 DIAGNOSIS — J439 Emphysema, unspecified: Secondary | ICD-10-CM | POA: Diagnosis not present

## 2018-06-10 DIAGNOSIS — J9621 Acute and chronic respiratory failure with hypoxia: Secondary | ICD-10-CM | POA: Diagnosis not present

## 2018-06-10 DIAGNOSIS — I10 Essential (primary) hypertension: Secondary | ICD-10-CM | POA: Diagnosis not present

## 2018-06-10 DIAGNOSIS — K5791 Diverticulosis of intestine, part unspecified, without perforation or abscess with bleeding: Secondary | ICD-10-CM | POA: Diagnosis not present

## 2018-06-10 DIAGNOSIS — E119 Type 2 diabetes mellitus without complications: Secondary | ICD-10-CM | POA: Diagnosis not present

## 2018-06-11 ENCOUNTER — Telehealth: Payer: Self-pay | Admitting: *Deleted

## 2018-06-11 NOTE — Telephone Encounter (Signed)
Received Home Health Certification and Plan of Care; forwarded to provider/SLS 03/06

## 2018-06-14 ENCOUNTER — Telehealth: Payer: Self-pay | Admitting: Pulmonary Disease

## 2018-06-14 ENCOUNTER — Institutional Professional Consult (permissible substitution): Payer: Medicare PPO | Admitting: Pulmonary Disease

## 2018-06-14 NOTE — Telephone Encounter (Signed)
Called [employer] and was placed on hold will call back.

## 2018-06-15 DIAGNOSIS — J9621 Acute and chronic respiratory failure with hypoxia: Secondary | ICD-10-CM | POA: Diagnosis not present

## 2018-06-15 DIAGNOSIS — I10 Essential (primary) hypertension: Secondary | ICD-10-CM | POA: Diagnosis not present

## 2018-06-15 DIAGNOSIS — K5791 Diverticulosis of intestine, part unspecified, without perforation or abscess with bleeding: Secondary | ICD-10-CM | POA: Diagnosis not present

## 2018-06-15 DIAGNOSIS — Z7984 Long term (current) use of oral hypoglycemic drugs: Secondary | ICD-10-CM | POA: Diagnosis not present

## 2018-06-15 DIAGNOSIS — E119 Type 2 diabetes mellitus without complications: Secondary | ICD-10-CM | POA: Diagnosis not present

## 2018-06-15 DIAGNOSIS — J8410 Pulmonary fibrosis, unspecified: Secondary | ICD-10-CM | POA: Diagnosis not present

## 2018-06-15 DIAGNOSIS — J439 Emphysema, unspecified: Secondary | ICD-10-CM | POA: Diagnosis not present

## 2018-06-15 DIAGNOSIS — D62 Acute posthemorrhagic anemia: Secondary | ICD-10-CM | POA: Diagnosis not present

## 2018-06-15 DIAGNOSIS — Z9981 Dependence on supplemental oxygen: Secondary | ICD-10-CM | POA: Diagnosis not present

## 2018-06-15 NOTE — Telephone Encounter (Signed)
Plan of care signed and faxed to [facility] at [phone]. Form sent for scanning.  

## 2018-06-16 ENCOUNTER — Other Ambulatory Visit: Payer: Self-pay

## 2018-06-16 NOTE — Telephone Encounter (Signed)
[place], [phone], spoke with Sharyne Peach.  Ofev PA started over telephone. Reference # [id number]   Medication name and strength: Ofev 100mg  Provider: Dr Elsworth Soho Pharmacy: [facility] Patient insurance ID: [id number] Phone: [phone] Was the PA started on CMM?  no  Timeframe for approval/denial: 24-48 hours via fax

## 2018-06-16 NOTE — Patient Outreach (Signed)
[facility]) Care Management  06/16/2018  [name] 1935/01/22 [id number]  BSW spoke with Callie Fielding at [facility] and [employer] who confirmed that patient has been approved for transportation services.  This service can be used for transport to MD appointments from [facility] to [place].   Successful outreach to patient to provide above update.  Patient confirmed receipt of Access ADA application (for transportation services in [facility] only) however he said that he is no longer in need of services.  BSW did provide him with contact for [facility] in the event that he needs to use this service in the future.   BSW is closing case.  Ronn Melena, BSW Social Worker [phone]

## 2018-06-17 NOTE — Telephone Encounter (Signed)
Received fax that the PA was approved until 04/07/2019.   Will close this encounter.

## 2018-06-18 ENCOUNTER — Other Ambulatory Visit: Payer: Self-pay

## 2018-06-18 ENCOUNTER — Other Ambulatory Visit: Payer: Self-pay | Admitting: *Deleted

## 2018-06-18 NOTE — Patient Outreach (Signed)
[facility]) Care Management  06/18/2018  Bradley Johns Dec 30, 1934 [id number]   RN spoke with pt today an received a update on his ongoing management of care. Pt doing well and denies any dizziness or weakness with no additional issues reported related to his recent anemia. Discussed his plan of care with prevented measures as pt indicates his [place] just finished with no additional follow up visits. Pt states he is feeling well with managing his care independently. No issues with getting to his providers appointments and no issues related to his prescribed medications. Pt however reported a "trial drug" that he may start later however at this time the medication cost is $2000.00 from his oncology office. Pt awaiting a call back for possible intervention on this new medication.   Plan of care discussed with updates and RN will continue to encouraged adherence. Will update the plan of care/interventions and offer any community resources as needed. No other inquires or request at this time.  [facility] CM Care Plan Problem One     Most Recent Value  Care Plan Problem One  Recent hospitalization for anemia  Role Documenting the Problem One  Care Management Coordinator  Care Plan for Problem One  Active  [facility] Long Term Goal   Pt will not have any hospitalization over the next 90 days related to anemia  [facility] Long Term Goal Start Date  05/27/18  Interventions for Problem One Long Term Goal  Will continue to review this information on prevention measures to avoid readmission. Will continue to offer needed resources for ongoing management of care. Will verify [facility] involvement as RN has ended with no additional visits. Will strongly encouraged ongoing adherence with recommendations from the agency. Will follow up in few weeks with plan of care update.   [facility] CM Short Term Goal #1   Adherence with all medical appointment post discharge over the next 30 days.  [facility] CM Short Term Goal #1  Start Date  05/27/18  [facility] CM Short Term Goal #1 Met Date  06/18/18  [facility] CM Short Term Goal #2   Adherence with medication post discharge over the next 30 days.  [facility] CM Short Term Goal #2 Start Date  05/27/18  [facility] CM Short Term Goal #2 Met Date  06/18/18      Raina Mina, RN Care Management Coordinator [place] Office [phone]

## 2018-06-30 ENCOUNTER — Ambulatory Visit: Payer: Medicare PPO | Admitting: Pulmonary Disease

## 2018-07-03 DIAGNOSIS — J449 Chronic obstructive pulmonary disease, unspecified: Secondary | ICD-10-CM | POA: Diagnosis not present

## 2018-07-05 DIAGNOSIS — J449 Chronic obstructive pulmonary disease, unspecified: Secondary | ICD-10-CM | POA: Diagnosis not present

## 2018-07-09 ENCOUNTER — Telehealth: Payer: Self-pay | Admitting: Pulmonary Disease

## 2018-07-09 DIAGNOSIS — Z5181 Encounter for therapeutic drug level monitoring: Secondary | ICD-10-CM

## 2018-07-09 NOTE — Telephone Encounter (Signed)
Reviewed patient's chart. Patient has not had any LFTs recently.   Left message to call back to see if we were to order the lab, would he like to stop by the lab in [place] since he lives in [place].

## 2018-07-11 ENCOUNTER — Other Ambulatory Visit: Payer: Self-pay | Admitting: Pulmonary Disease

## 2018-07-11 DIAGNOSIS — J84112 Idiopathic pulmonary fibrosis: Secondary | ICD-10-CM

## 2018-07-12 NOTE — Telephone Encounter (Signed)
Patient states that he will get lab work done after [place] -19 is over. I will place order.

## 2018-07-21 ENCOUNTER — Telehealth: Payer: Self-pay

## 2018-07-21 NOTE — Telephone Encounter (Signed)
LMOM Pt due for 4-5 week f/u (post hosp f/u)- instructed to call office to set up virtual visit.

## 2018-07-23 ENCOUNTER — Other Ambulatory Visit: Payer: Self-pay | Admitting: *Deleted

## 2018-07-23 NOTE — Patient Outreach (Signed)
[place] [facility]) Care Management  07/23/2018  Bradley Johns December 02, 1934 [id number]    RN spoke with pt who confirm his ongoing management of care. Pt states he has spoken with a nutritionist and follows the dietary plan with consuming the correct foods to assist with his anemia. Pt states he is doing "great" with no reported issues and denies any symptoms related to anemia. No weakness, no dizziness or drowsiness as pt remains stable with no pending follow up lab appointments. Denies any issues with GU/GI with no additional needs.  Plan of care discussed with goals and interventions. RN will continue to encouraged pt on his adherence with the above information discussed today. Will continue to offer any needed resources as pt continue to management his care independently. Will follow up in one month and discuss possible discharge over the next few months if pt continues to do well with no acute issues.  [facility] CM Care Plan Problem One     Most Recent Value  Care Plan Problem One  Recent hospitalization for anemia  Role Documenting the Problem One  Care Management Coordinator  Care Plan for Problem One  Active  [facility] Long Term Goal   Pt will not have any hospitalization over the next 90 days related to anemia  [facility] Long Term Goal Start Date  05/27/18  Interventions for Problem One Long Term Goal  Will verify pt continue to manage his ongoing status with prevention measures. Will verify no signs or symptoms related to anemia with no weakness, fatigue or drowsiness. Will verify pt has continues to utilize the recommended food items to assist with his ongong anemia. Pt has denied any additional symptoms of anemia and continue to do well with this management of care.      Raina Mina, RN Care Management Coordinator [place] Office [phone]

## 2018-07-26 ENCOUNTER — Telehealth: Payer: Self-pay

## 2018-07-26 NOTE — Telephone Encounter (Signed)
Copied from [place] [phone]. Topic: General - Other >> Jul 26, 2018  3:52 PM Bradley Johns, NT wrote: Reason for CRM: Patient called in stating he has an abscess in his mouth, that causes pain and inability to sleep, and would like some advice on some OTC medications he could take to assist with this. Patient states he has done salt rinses and warm compresses. Please advise and call back at [phone]

## 2018-07-27 ENCOUNTER — Other Ambulatory Visit: Payer: Self-pay

## 2018-07-27 ENCOUNTER — Encounter: Payer: Self-pay | Admitting: Internal Medicine

## 2018-07-27 ENCOUNTER — Ambulatory Visit (INDEPENDENT_AMBULATORY_CARE_PROVIDER_SITE_OTHER): Payer: Medicare PPO | Admitting: Internal Medicine

## 2018-07-27 DIAGNOSIS — M1990 Unspecified osteoarthritis, unspecified site: Secondary | ICD-10-CM

## 2018-07-27 DIAGNOSIS — M109 Gout, unspecified: Secondary | ICD-10-CM | POA: Diagnosis not present

## 2018-07-27 DIAGNOSIS — J439 Emphysema, unspecified: Secondary | ICD-10-CM

## 2018-07-27 DIAGNOSIS — M150 Primary generalized (osteo)arthritis: Secondary | ICD-10-CM

## 2018-07-27 DIAGNOSIS — M159 Polyosteoarthritis, unspecified: Secondary | ICD-10-CM

## 2018-07-27 DIAGNOSIS — M8949 Other hypertrophic osteoarthropathy, multiple sites: Secondary | ICD-10-CM

## 2018-07-27 DIAGNOSIS — K05219 Aggressive periodontitis, localized, unspecified severity: Secondary | ICD-10-CM

## 2018-07-27 MED ORDER — AMOXICILLIN-POT CLAVULANATE 875-125 MG PO TABS: 1.0000 | ORAL_TABLET | Freq: Two times a day (BID) | ORAL | 0 refills | Status: DC

## 2018-07-27 MED ORDER — HYDROCODONE-ACETAMINOPHEN 5-325 MG PO TABS: 1.0000 | ORAL_TABLET | Freq: Three times a day (TID) | ORAL | 0 refills | Status: DC | PRN

## 2018-07-27 NOTE — Telephone Encounter (Signed)
Virtual Visit scheduled °

## 2018-07-27 NOTE — Telephone Encounter (Signed)
Please call pt, either need to go to the UC or have a virtual visit ; please arrange

## 2018-07-27 NOTE — Progress Notes (Signed)
Subjective:    Patient ID: Bradley Johns, male    DOB: 1934-12-25, 84 y.o.   MRN: [id number]  DOS:  07/27/2018 Type of visit - description: Attempted  to make this a video visit, due to technical difficulties from the patient side it was not possible  thus we proceeded with a Virtual Visit via Telephone    I connected with@ on 07/28/18 at 10:20 AM EDT by telephone and verified that I am speaking with the correct person using two identifiers.  THIS ENCOUNTER IS A VIRTUAL VISIT DUE TO COVID-19 - PATIENT WAS NOT SEEN IN THE OFFICE. PATIENT HAS CONSENTED TO VIRTUAL VISIT / TELEMEDICINE VISIT   Location of patient: home  Location of provider: office  I discussed the limitations, risks, security and privacy concerns of performing an evaluation and management service by telephone and the availability of in person appointments. I also discussed with the patient that there may be a patient responsible charge related to this service. The patient expressed understanding and agreed to proceed.   History of Present Illness: Acute visit. His main concern is a lump at the left lower gum,  he feels is an abscess, he is soaking the area it warm salted water and the size has actually decreased, size is less than a grape. He has few teeth, no tooth at the area of abscess.  We reviewed his medications. Needs a refill on hydrocodone.   Review of Systems Denies fever chills Denies difficulty swallowing or breathing.  Past Medical History:  Diagnosis Date  . Arthritis   . Benign prostatic hypertrophy   . COPD (chronic obstructive pulmonary disease) ([place])    24/7 O2  . Diabetes mellitus    per Dr Posey Pronto  . Epidermal cyst of face    left lower lateral cheek  . Gout   . Hyperlipidemia   . Hypertension   . Pulmonary fibrosis ([place]) 2014  . Sleep apnea     Past Surgical History:  Procedure Laterality Date  . COLONOSCOPY WITH PROPOFOL N/A 05/18/2018   Procedure: COLONOSCOPY WITH PROPOFOL;   Surgeon: Carol Ada, MD;  Location: [facility];  Service: Endoscopy;  Laterality: N/A;  . POLYPECTOMY  05/18/2018   Procedure: POLYPECTOMY;  Surgeon: Carol Ada, MD;  Location: [facility];  Service: Endoscopy;;  . TOE SURGERY      Social History   Socioeconomic History  . Marital status: Married    Spouse name: Not on file  . Number of children: 2  . Years of education: Not on file  . Highest education level: Not on file  Occupational History  . Occupation: retired  Scientific laboratory technician  . Financial resource strain: Not on file  . Food insecurity:    Worry: Not on file    Inability: Not on file  . Transportation needs:    Medical: Not on file    Non-medical: Not on file  Tobacco Use  . Smoking status: Former Smoker    Packs/day: 1.00    Years: 30.00    Pack years: 30.00    Types: Cigarettes    Last attempt to quit: 04/07/1996    Years since quitting: 22.3  . Smokeless tobacco: Never Used  Substance and Sexual Activity  . Alcohol use: Yes    Comment: occassional beer  . Drug use: No  . Sexual activity: Yes  Lifestyle  . Physical activity:    Days per week: Not on file    Minutes per session: Not on file  .  Stress: Not on file  Relationships  . Social connections:    Talks on phone: Not on file    Gets together: Not on file    Attends religious service: Not on file    Active member of club or organization: Not on file    Attends meetings of clubs or organizations: Not on file    Relationship status: Not on file  . Intimate partner violence:    Fear of current or ex partner: Not on file    Emotionally abused: Not on file    Physically abused: Not on file    Forced sexual activity: Not on file  Other Topics Concern  . Not on file  Social History Narrative   Lives w/ wife      Allergies as of 07/27/2018   No Known Allergies     Medication List       Accurate as of July 27, 2018 11:59 PM. Always use your most recent med list.        Accu-Chek Aviva  Plus test strip Generic drug:  glucose blood Reported on 06/20/2015   Accu-Chek Aviva Plus w/Device Kit Reported on 06/20/2015   Accu-Chek Softclix Lancets lancets Reported on 06/20/2015   amLODipine 10 MG tablet Commonly known as:  NORVASC Take 1 tablet (10 mg total) by mouth daily.   amoxicillin-clavulanate 875-125 MG tablet Commonly known as:  Augmentin Take 1 tablet by mouth 2 (two) times daily.   aspirin 81 MG tablet Take 1 tablet (81 mg total) by mouth daily. Start taking 05/27/18   atorvastatin 80 MG tablet Commonly known as:  LIPITOR Take 1 tablet (80 mg total) by mouth daily.   B-D UF III MINI PEN NEEDLES 31G X 5 MM Misc Generic drug:  Insulin Pen Needle Reported on 06/20/2015   carvedilol 12.5 MG tablet Commonly known as:  COREG Take 1 tablet (12.5 mg total) by mouth 2 (two) times daily with a meal.   colchicine 0.6 MG tablet Take 1 tablet (0.6 mg total) by mouth 2 (two) times daily as needed.   doxazosin 4 MG tablet Commonly known as:  CARDURA Take 1 tablet (4 mg total) by mouth at bedtime.   HYDROcodone-acetaminophen 5-325 MG tablet Commonly known as:  NORCO/VICODIN Take 1 tablet by mouth every 8 (eight) hours as needed.   ketoconazole 2 % cream Commonly known as:  NIZORAL Apply 1 application topically daily.   losartan 50 MG tablet Commonly known as:  COZAAR Take 1 tablet (50 mg total) by mouth daily.   metFORMIN 500 MG tablet Commonly known as:  GLUCOPHAGE Take 500 mg by mouth 2 (two) times daily with a meal.   multivitamin tablet Take 1 tablet by mouth daily.   Ofev 150 MG Caps Generic drug:  Nintedanib TAKE 1 CAPSULE BY MOUTH TWICE A DAY 12 HOURS APART WITH FOOD   OXYGEN Inhale into the lungs. 3L Nocturnal and PRN for Hypoxemia   pioglitazone 45 MG tablet Commonly known as:  ACTOS Take 45 mg by mouth daily.           Objective:   Physical Exam There were no vitals taken for this visit. This is a telephone visit, alert oriented  x3, voice is normal, no distress    Assessment    Assessment  DM Dr. Posey Pronto  HTN Hyperlipidemia DJD-- rarely uses hydrocodone Gout -- occ colchicine PULM: --COPD on oxygen  24/7   --OSA - no compliant w/ cpap --Pulmonary fibrosis BPH (Nocturia), has not  seen urology, last PSA DRE  2016 wnl, myrbetriq -no help    PLAN This is a phone virtual visit.  BP today checked by the patient 129/56, blood sugar 101, weight 195 pounds. Gum abscess: The patient is aware of the limitations due to this vein virtual visit.  By his description it sounds indeed like he has an abscess. Plan: Augmentin for 1 week, continue soaking in warm salty water, reach out for a dentist ASAP.  Warned that about the potential complications such as severe infection ([place]'s angina) .  Will go to the ER if swelling increases, neck get swollen or hard to touch, difficulty swallowing or breathing. DJD: Refill hydrocodone COPD, pulmonary fibrosis: Currently on no inhalers, waiting for approval of Ofev Gout: Symptoms are controlled with as needed colchicine History of diverticular bleed: No further symptoms COVID-19: Taking great precautions, he is high risk and he knows it. RTC: Has an appointment 10-2018     I discussed the assessment and treatment plan with the patient. The patient was provided an opportunity to ask questions and all were answered. The patient agreed with the plan and demonstrated an understanding of the instructions.   The patient was advised to call back or seek an in-person evaluation if the symptoms worsen or if the condition fails to improve as anticipated.  I provided 15  minutes of non-face-to-face time during this encounter.  Kathlene November, MD

## 2018-07-28 NOTE — Assessment & Plan Note (Signed)
This is a phone virtual visit.  BP today checked by the patient 129/56, blood sugar 101, weight 195 pounds. Gum abscess: The patient is aware of the limitations due to this vein virtual visit.  By his description it sounds indeed like he has an abscess. Plan: Augmentin for 1 week, continue soaking in warm salty water, reach out for a dentist ASAP.  Warned that about the potential complications such as severe infection ([place]'s angina) .  Will go to the ER if swelling increases, neck get swollen or hard to touch, difficulty swallowing or breathing. DJD: Refill hydrocodone COPD, pulmonary fibrosis: Currently on no inhalers, waiting for approval of Ofev Gout: Symptoms are controlled with as needed colchicine History of diverticular bleed: No further symptoms COVID-19: Taking great precautions, he is high risk and he knows it. RTC: Has an appointment 10-2018

## 2018-08-03 DIAGNOSIS — J449 Chronic obstructive pulmonary disease, unspecified: Secondary | ICD-10-CM | POA: Diagnosis not present

## 2018-08-05 DIAGNOSIS — J449 Chronic obstructive pulmonary disease, unspecified: Secondary | ICD-10-CM | POA: Diagnosis not present

## 2018-08-23 ENCOUNTER — Other Ambulatory Visit: Payer: Self-pay | Admitting: *Deleted

## 2018-08-23 NOTE — Patient Outreach (Signed)
[facility]) Care Management  08/23/2018  DAMAURI MINION 1934/07/09 [id number]   Telephone Assessment   RN spoke with pt today and received an update on pt's ongoing management of care. Pt states his levels are stable however unable to get his labs drawn until June but no signs or symptoms related to anemia. Pt continues to use his home O2 on 3 liters but may increased to 4 liters with MD awareness based upon his increased activities. No acute issues have occurred as RN discussed pt's current plan of care with long term goals and interventions adjusted accordingly. Based upon pt's progress RN begin discussing possible graduating from the [facility] program. Pt receptive and has agreed to an additional month RN will follow up with potential labs availability.   Plan of care review and discussed with intervention again adjusted. Will follow up next month on pt's progress prior to possible graduating from the [facility] program and services. RN will update pt's provider on pt's disposition with [facility] services.   THN CM Care Plan Problem One     Most Recent Value  Care Plan Problem One  Recent hospitalization for anemia  Role Documenting the Problem One  Care Management Coordinator  Care Plan for Problem One  Active  THN Long Term Goal   Pt will not have any hospitalization over the next 90 days related to anemia  THN Long Term Goal Start Date  05/27/18  Interventions for Problem One Long Term Goal  Will reiterate ongoing prevention measures and verify pt continue to monitor his overall symptoms related to H/H.        Raina Mina, RN Care Management Coordinator [place] Office [phone]

## 2018-09-02 DIAGNOSIS — J449 Chronic obstructive pulmonary disease, unspecified: Secondary | ICD-10-CM | POA: Diagnosis not present

## 2018-09-04 DIAGNOSIS — J449 Chronic obstructive pulmonary disease, unspecified: Secondary | ICD-10-CM | POA: Diagnosis not present

## 2018-09-15 ENCOUNTER — Other Ambulatory Visit: Payer: Self-pay | Admitting: Internal Medicine

## 2018-09-29 ENCOUNTER — Other Ambulatory Visit: Payer: Self-pay | Admitting: *Deleted

## 2018-09-29 NOTE — Patient Outreach (Signed)
[facility]) Care Management  09/29/2018  RHODERICK FARREL Jun 02, 1934 [id number]    Telephone Assessment-Case Closure Artist Pais)  RN spoke with pt today and received an update on pt's ongoing management of care. Plan of care reviewed with all goals met and pt continues to be symptoms free from any anemia changes. Pt continue to states his is awaiting the laboratory to open for an appointment when needed for his levels again but no symptoms or signs of low levels. All goals again have been met as RN inquired on any other needs at this time. Pt indicated no needs and receptive to a discharge today from [facility] services. Pt is aware his provider will be notified and if at any time he has concerns, inquires or question to contact this RN case manager or reach directly to the [facility] office for resources (pt with understanding). No other services needed at this time. Case will be closed and provider notified.  Raina Mina, RN Care Management Coordinator [place] Office [phone]

## 2018-10-03 DIAGNOSIS — J449 Chronic obstructive pulmonary disease, unspecified: Secondary | ICD-10-CM | POA: Diagnosis not present

## 2018-11-02 DIAGNOSIS — J449 Chronic obstructive pulmonary disease, unspecified: Secondary | ICD-10-CM | POA: Diagnosis not present

## 2018-11-03 ENCOUNTER — Other Ambulatory Visit: Payer: Self-pay

## 2018-11-03 ENCOUNTER — Encounter: Payer: Self-pay | Admitting: Internal Medicine

## 2018-11-03 ENCOUNTER — Ambulatory Visit (INDEPENDENT_AMBULATORY_CARE_PROVIDER_SITE_OTHER): Payer: Medicare HMO | Admitting: Internal Medicine

## 2018-11-03 VITALS — BP 142/61 | HR 91 | Wt 195.0 lb

## 2018-11-03 DIAGNOSIS — J439 Emphysema, unspecified: Secondary | ICD-10-CM

## 2018-11-03 DIAGNOSIS — I10 Essential (primary) hypertension: Secondary | ICD-10-CM | POA: Diagnosis not present

## 2018-11-03 DIAGNOSIS — J8410 Pulmonary fibrosis, unspecified: Secondary | ICD-10-CM

## 2018-11-03 NOTE — Progress Notes (Signed)
Subjective:    Patient ID: Bradley Johns, male    DOB: 1934-10-10, 84 y.o.   MRN: [id number]  DOS:  11/03/2018 Type of visit - description: Attempted  to make this a video visit, due to technical difficulties from the patient side it was not possible  thus we proceeded with a Virtual Visit via Telephone    I connected with@   by telephone and verified that I am speaking with the correct person using two identifiers.  THIS ENCOUNTER IS A VIRTUAL VISIT DUE TO COVID-19 - PATIENT WAS NOT SEEN IN THE OFFICE. PATIENT HAS CONSENTED TO VIRTUAL VISIT / TELEMEDICINE VISIT   Location of patient: home  Location of provider: office  I discussed the limitations, risks, security and privacy concerns of performing an evaluation and management service by telephone and the availability of in person appointments. I also discussed with the patient that there may be a patient responsible charge related to this service. The patient expressed understanding and agreed to proceed.   History of Present Illness: Routine visit Since the last office visit, he is doing well. Good compliance with medication except OFEV, is on hold because he has not been able to get LFTs done (d/t covid risks declines to go to any office  for labs) .   Review of Systems Denies fever chills No nausea, vomiting, diarrhea. Difficulty breathing at baseline  Past Medical History:  Diagnosis Date  . Arthritis   . Benign prostatic hypertrophy   . COPD (chronic obstructive pulmonary disease) ([place])    24/7 O2  . Diabetes mellitus    per Dr Posey Pronto  . Epidermal cyst of face    left lower lateral cheek  . Gout   . Hyperlipidemia   . Hypertension   . Pulmonary fibrosis ([place]) 2014  . Sleep apnea     Past Surgical History:  Procedure Laterality Date  . COLONOSCOPY WITH PROPOFOL N/A 05/18/2018   Procedure: COLONOSCOPY WITH PROPOFOL;  Surgeon: Carol Ada, MD;  [facility];  Service: Endoscopy;  Laterality: N/A;  .  POLYPECTOMY  05/18/2018   Procedure: POLYPECTOMY;  Surgeon: Carol Ada, MD;  [facility];  Service: Endoscopy;;  . TOE SURGERY      Social History   Socioeconomic History  . Marital status: Married    Spouse name: Not on file  . Number of children: 2  . Years of education: Not on file  . Highest education level: Not on file  Occupational History  . Occupation: retired  Scientific laboratory technician  . Financial resource strain: Not on file  . Food insecurity    Worry: Not on file    Inability: Not on file  . Transportation needs    Medical: Not on file    Non-medical: Not on file  Tobacco Use  . Smoking status: Former Smoker    Packs/day: 1.00    Years: 30.00    Pack years: 30.00    Types: Cigarettes    Quit date: 04/07/1996    Years since quitting: 22.5  . Smokeless tobacco: Never Used  Substance and Sexual Activity  . Alcohol use: Yes    Comment: occassional beer  . Drug use: No  . Sexual activity: Yes  Lifestyle  . Physical activity    Days per week: Not on file    Minutes per session: Not on file  . Stress: Not on file  Relationships  . Social connections    Talks on phone: Not on file  Gets together: Not on file    Attends religious service: Not on file    Active member of club or organization: Not on file    Attends meetings of clubs or organizations: Not on file    Relationship status: Not on file  . Intimate partner violence    Fear of current or ex partner: Not on file    Emotionally abused: Not on file    Physically abused: Not on file    Forced sexual activity: Not on file  Other Topics Concern  . Not on file  Social History Narrative   Lives w/ wife      Allergies as of 11/03/2018   No Known Allergies     Medication List       Accurate as of November 03, 2018 11:59 PM. If you have any questions, ask your nurse or doctor.        STOP taking these medications   amoxicillin-clavulanate 875-125 MG tablet Commonly known as: Augmentin Stopped  by: Kathlene November, MD     TAKE these medications   Accu-Chek Aviva Plus test strip Generic drug: glucose blood Reported on 06/20/2015   Accu-Chek Aviva Plus w/Device Kit Reported on 06/20/2015   Accu-Chek Softclix Lancets lancets Reported on 06/20/2015   amLODipine 10 MG tablet Commonly known as: NORVASC Take 1 tablet (10 mg total) by mouth daily.   aspirin 81 MG tablet Take 1 tablet (81 mg total) by mouth daily. Start taking 05/27/18   atorvastatin 80 MG tablet Commonly known as: LIPITOR Take 1 tablet (80 mg total) by mouth daily. What changed: when to take this   B-D UF III MINI PEN NEEDLES 31G X 5 MM Misc Generic drug: Insulin Pen Needle Reported on 06/20/2015   carvedilol 12.5 MG tablet Commonly known as: COREG Take 1 tablet (12.5 mg total) by mouth 2 (two) times daily with a meal.   colchicine 0.6 MG tablet Take 1 tablet (0.6 mg total) by mouth 2 (two) times daily as needed.   doxazosin 4 MG tablet Commonly known as: CARDURA Take 1 tablet (4 mg total) by mouth at bedtime.   HYDROcodone-acetaminophen 5-325 MG tablet Commonly known as: NORCO/VICODIN Take 1 tablet by mouth every 8 (eight) hours as needed.   ketoconazole 2 % cream Commonly known as: NIZORAL Apply 1 application topically daily. What changed:   when to take this  reasons to take this   losartan 50 MG tablet Commonly known as: COZAAR Take 1 tablet (50 mg total) by mouth daily.   metFORMIN 500 MG tablet Commonly known as: GLUCOPHAGE Take 500 mg by mouth 2 (two) times daily with a meal.   multivitamin tablet Take 1 tablet by mouth daily.   Ofev 150 MG Caps Generic drug: Nintedanib TAKE 1 CAPSULE BY MOUTH TWICE A DAY 12 HOURS APART WITH FOOD   OXYGEN Inhale into the lungs. 3L Nocturnal and PRN for Hypoxemia   pioglitazone 45 MG tablet Commonly known as: ACTOS Take 45 mg by mouth daily.           Objective:   Physical Exam BP (!) 142/61   Pulse 91   Wt 195 lb (88.5 kg)   SpO2 91%    BMI 29.65 kg/m  This is a virtual phone visit.  Alert oriented x3, he is speaking complete sentences.  Vital signs reviewed.      Assessment     Assessment  DM Dr. Posey Pronto  HTN Hyperlipidemia DJD-- rarely uses hydrocodone Gout -- occ  colchicine PULM: --COPD on oxygen  24/7   --OSA - no compliant w/ cpap --Pulmonary fibrosis BPH (Nocturia), has not seen urology, last PSA DRE  2016 wnl, myrbetriq -no help    PLAN DM: Per endocrinology HTN: On amlodipine, carvedilol, Cardura, losartan.  BP today 142/61, reports he checks regularly and is never higher than the low 140s. Gum abscess: See last visit, took antibiotics, area is back to normal.  Has not been able to see dentist COPD, pulmonary fibrosis: Follow-up by pulmonary, patient reports he is holding [place]  because he is declining to get blood work. COVID-19: He is taking very good precautions, declining to come to the office as he knows he is high risk for complications. Preventive care: Needs a flu shot as soon as it is available, depending on how things going with coronavirus, will get it at the pharmacy or here. If he decides to get it at this office for flu shot, will do blood work the same day he comes. RTC 3 months virtually.       I discussed the assessment and treatment plan with the patient. The patient was provided an opportunity to ask questions and all were answered. The patient agreed with the plan and demonstrated an understanding of the instructions.   The patient was advised to call back or seek an in-person evaluation if the symptoms worsen or if the condition fails to improve as anticipated.  I provided 15  minutes of non-face-to-face time during this encounter.  Kathlene November, MD

## 2018-11-04 NOTE — Assessment & Plan Note (Signed)
DM: Per endocrinology HTN: On amlodipine, carvedilol, Cardura, losartan.  BP today 142/61, reports he checks regularly and is never higher than the low 140s. Gum abscess: See last visit, took antibiotics, area is back to normal.  Has not been able to see dentist COPD, pulmonary fibrosis: Follow-up by pulmonary, patient reports he is holding [place]  because he is declining to get blood work. COVID-19: He is taking very good precautions, declining to come to the office as he knows he is high risk for complications. Preventive care: Needs a flu shot as soon as it is available, depending on how things going with coronavirus, will get it at the pharmacy or here. If he decides to get it at this office for flu shot, will do blood work the same day he comes. RTC 3 months virtually.

## 2018-11-08 ENCOUNTER — Telehealth: Payer: Self-pay | Admitting: Pulmonary Disease

## 2018-11-08 NOTE — Telephone Encounter (Signed)
Received CMN from [employer]  For 5L cont O2 Last seen 05/2018 He needs OV with APP for LFTs & ambulatory satn check to ensure that 5 L cont is enough for him, then I can sign CMN

## 2018-11-08 NOTE — Telephone Encounter (Signed)
Called patient and advised per RA direction: Explained patient is due for 02 recert and that this can't be done over the phone. Pt does not want to come in due to [place]. Assured we are taking all precautions in effort to keep everyone safe. Pt will call his insurance [facility]) and find out if he has to come in. Pt will call office back.

## 2018-11-10 NOTE — Telephone Encounter (Signed)
ATC pt, no answer and I could not leave a message. 

## 2018-11-10 NOTE — Telephone Encounter (Signed)
Patient would like to know who requested paperwork to be filled out for him to continue oxygen.  Patient phone number is [phone].

## 2018-11-17 DIAGNOSIS — I10 Essential (primary) hypertension: Secondary | ICD-10-CM | POA: Diagnosis not present

## 2018-11-17 DIAGNOSIS — E7800 Pure hypercholesterolemia, unspecified: Secondary | ICD-10-CM | POA: Diagnosis not present

## 2018-11-17 DIAGNOSIS — E119 Type 2 diabetes mellitus without complications: Secondary | ICD-10-CM | POA: Diagnosis not present

## 2018-11-19 DIAGNOSIS — E1165 Type 2 diabetes mellitus with hyperglycemia: Secondary | ICD-10-CM | POA: Diagnosis not present

## 2018-12-03 DIAGNOSIS — J449 Chronic obstructive pulmonary disease, unspecified: Secondary | ICD-10-CM | POA: Diagnosis not present

## 2018-12-27 ENCOUNTER — Telehealth: Payer: Self-pay | Admitting: Pulmonary Disease

## 2018-12-27 NOTE — Telephone Encounter (Signed)
Spoke with Bradley Johns  She is calling as FYI that the Education officer, museum there has tried reaching out to this pt multiple times to check on him and make sure he is doing ok on his medication, but he never picks up the phone   If he is having any problems, he should contact them at [phone]  I called and spoke with the pt and made him aware of this  He verbalized understanding  Has not started on OFEV yet bc he does not want to come in to the office and do labs due to fear of Covid 19  He states will call when he is ready to begin the process

## 2018-12-31 ENCOUNTER — Ambulatory Visit (INDEPENDENT_AMBULATORY_CARE_PROVIDER_SITE_OTHER): Payer: Medicare HMO

## 2018-12-31 ENCOUNTER — Other Ambulatory Visit: Payer: Self-pay

## 2018-12-31 DIAGNOSIS — Z23 Encounter for immunization: Secondary | ICD-10-CM

## 2018-12-31 NOTE — Progress Notes (Signed)
Here for flu shot

## 2019-01-03 DIAGNOSIS — J449 Chronic obstructive pulmonary disease, unspecified: Secondary | ICD-10-CM | POA: Diagnosis not present

## 2019-02-02 DIAGNOSIS — J449 Chronic obstructive pulmonary disease, unspecified: Secondary | ICD-10-CM | POA: Diagnosis not present

## 2019-03-01 ENCOUNTER — Other Ambulatory Visit: Payer: Self-pay | Admitting: Internal Medicine

## 2019-03-02 ENCOUNTER — Telehealth: Payer: Self-pay

## 2019-03-02 NOTE — Telephone Encounter (Signed)
Form received from [employer] from [employer]- form completed and faxed back to [phone]. Form sent for scanning.

## 2019-03-05 DIAGNOSIS — J449 Chronic obstructive pulmonary disease, unspecified: Secondary | ICD-10-CM | POA: Diagnosis not present

## 2019-03-05 IMAGING — NM NM GI BLOOD LOSS
2 series · 12 of 12 positions shown · non-contrast
Comparison: None.

CLINICAL DATA: Recent GI bleeding, occurring early this morning.
Patient had a colonoscopy on 05/18/2018.

EXAM:
NUCLEAR MEDICINE GASTROINTESTINAL BLEEDING SCAN
TECHNIQUE: Sequential abdominal images were obtained following intravenous
administration of Bc-99m labeled red blood cells.
RADIOPHARMACEUTICALS:  23.9 mCi Bc-99m pertechnetate in-vitro
labeled red cells.

[Series 1: gi bleed · 4.14mm/px · 6 of 60 frames shown (1 of 2)]
[frame 6/60]
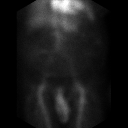
[frame 16/60]
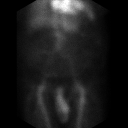
[frame 26/60]
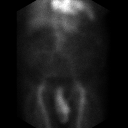
[frame 36/60]
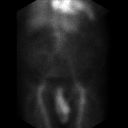
[frame 46/60]
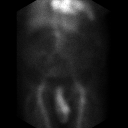
[frame 56/60]
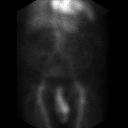

[Series 2: gi bleed · 4.14mm/px · 6 of 60 frames shown (2 of 2)]
[frame 6/60]
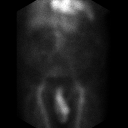
[frame 16/60]
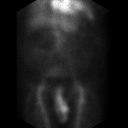
[frame 26/60]
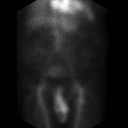
[frame 36/60]
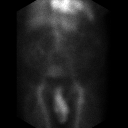
[frame 46/60]
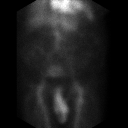
[frame 56/60]
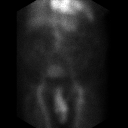

[12 of 12 positions shown; findings below may reference images not displayed]

FINDINGS: There is no abnormal radiotracer accumulation to indicate a GI
bleeding source. Physiologic uptake is noted. There is some uptake
below the perineum consistent with urine leakage.
IMPRESSION: 1. No evidence of a GI bleeding source.

## 2019-04-04 DIAGNOSIS — J449 Chronic obstructive pulmonary disease, unspecified: Secondary | ICD-10-CM | POA: Diagnosis not present

## 2019-04-12 ENCOUNTER — Telehealth: Payer: Self-pay | Admitting: Internal Medicine

## 2019-04-12 NOTE — Telephone Encounter (Signed)
Called patient to schedule AWV, but now answer. Will try to call patient back at a later time. SF

## 2019-05-05 DIAGNOSIS — J449 Chronic obstructive pulmonary disease, unspecified: Secondary | ICD-10-CM | POA: Diagnosis not present

## 2019-05-17 ENCOUNTER — Other Ambulatory Visit: Payer: Self-pay | Admitting: Internal Medicine

## 2019-05-17 DIAGNOSIS — D62 Acute posthemorrhagic anemia: Secondary | ICD-10-CM

## 2019-06-05 DIAGNOSIS — J449 Chronic obstructive pulmonary disease, unspecified: Secondary | ICD-10-CM | POA: Diagnosis not present

## 2019-06-22 ENCOUNTER — Telehealth: Payer: Self-pay | Admitting: Internal Medicine

## 2019-06-22 NOTE — Telephone Encounter (Signed)
Please advise 

## 2019-06-22 NOTE — Telephone Encounter (Signed)
Spoke w/ Pt- informed of PCP recommendations- offered him the [facility] number for scheduling- declined he will call [facility] first.

## 2019-06-22 NOTE — Telephone Encounter (Signed)
Please advise whether patient should get covid vaccine or not due to health condition   Call back # [phone]

## 2019-06-22 NOTE — Telephone Encounter (Signed)
Yes, strongly recommend to take the Covid vaccination

## 2019-06-22 NOTE — Telephone Encounter (Signed)
Also- Pt overdue for visit- offered to schedule appt- he declined at this time.

## 2019-07-03 DIAGNOSIS — J449 Chronic obstructive pulmonary disease, unspecified: Secondary | ICD-10-CM | POA: Diagnosis not present

## 2019-08-03 DIAGNOSIS — J449 Chronic obstructive pulmonary disease, unspecified: Secondary | ICD-10-CM | POA: Diagnosis not present

## 2019-09-02 DIAGNOSIS — J449 Chronic obstructive pulmonary disease, unspecified: Secondary | ICD-10-CM | POA: Diagnosis not present

## 2019-10-03 DIAGNOSIS — J449 Chronic obstructive pulmonary disease, unspecified: Secondary | ICD-10-CM | POA: Diagnosis not present

## 2019-11-02 DIAGNOSIS — J449 Chronic obstructive pulmonary disease, unspecified: Secondary | ICD-10-CM | POA: Diagnosis not present

## 2019-12-03 DIAGNOSIS — J449 Chronic obstructive pulmonary disease, unspecified: Secondary | ICD-10-CM | POA: Diagnosis not present

## 2020-01-03 DIAGNOSIS — J449 Chronic obstructive pulmonary disease, unspecified: Secondary | ICD-10-CM | POA: Diagnosis not present

## 2020-01-18 DIAGNOSIS — J449 Chronic obstructive pulmonary disease, unspecified: Secondary | ICD-10-CM | POA: Diagnosis not present

## 2020-01-18 DIAGNOSIS — E119 Type 2 diabetes mellitus without complications: Secondary | ICD-10-CM | POA: Diagnosis not present

## 2020-01-18 DIAGNOSIS — I10 Essential (primary) hypertension: Secondary | ICD-10-CM | POA: Diagnosis not present

## 2020-01-18 DIAGNOSIS — E7800 Pure hypercholesterolemia, unspecified: Secondary | ICD-10-CM | POA: Diagnosis not present

## 2020-01-18 DIAGNOSIS — J8410 Pulmonary fibrosis, unspecified: Secondary | ICD-10-CM | POA: Diagnosis not present

## 2020-01-18 LAB — HM DIABETES FOOT EXAM

## 2020-02-02 DIAGNOSIS — J449 Chronic obstructive pulmonary disease, unspecified: Secondary | ICD-10-CM | POA: Diagnosis not present

## 2020-03-04 DIAGNOSIS — J449 Chronic obstructive pulmonary disease, unspecified: Secondary | ICD-10-CM | POA: Diagnosis not present

## 2020-03-31 ENCOUNTER — Emergency Department (HOSPITAL_COMMUNITY): Admission: EM | Admit: 2020-03-31 | Payer: Medicare HMO | Source: Home / Self Care

## 2020-03-31 ENCOUNTER — Emergency Department (HOSPITAL_COMMUNITY): Payer: Medicare HMO

## 2020-03-31 ENCOUNTER — Encounter (HOSPITAL_COMMUNITY): Payer: Self-pay | Admitting: Internal Medicine

## 2020-03-31 ENCOUNTER — Inpatient Hospital Stay (HOSPITAL_COMMUNITY)
Admission: EM | Admit: 2020-03-31 | Discharge: 2020-04-14 | DRG: 811 | Disposition: A | Discharge status: Skilled Nursing Facility | Payer: Medicare HMO | Attending: Internal Medicine | Admitting: Internal Medicine

## 2020-03-31 DIAGNOSIS — I088 Other rheumatic multiple valve diseases: Secondary | ICD-10-CM | POA: Diagnosis present

## 2020-03-31 DIAGNOSIS — R092 Respiratory arrest: Secondary | ICD-10-CM | POA: Diagnosis not present

## 2020-03-31 DIAGNOSIS — S022XXA Fracture of nasal bones, initial encounter for closed fracture: Secondary | ICD-10-CM | POA: Diagnosis present

## 2020-03-31 DIAGNOSIS — I469 Cardiac arrest, cause unspecified: Secondary | ICD-10-CM | POA: Diagnosis not present

## 2020-03-31 DIAGNOSIS — J9611 Chronic respiratory failure with hypoxia: Secondary | ICD-10-CM | POA: Diagnosis not present

## 2020-03-31 DIAGNOSIS — R0600 Dyspnea, unspecified: Secondary | ICD-10-CM | POA: Diagnosis present

## 2020-03-31 DIAGNOSIS — N1832 Chronic kidney disease, stage 3b: Secondary | ICD-10-CM | POA: Diagnosis present

## 2020-03-31 DIAGNOSIS — I5033 Acute on chronic diastolic (congestive) heart failure: Secondary | ICD-10-CM | POA: Diagnosis present

## 2020-03-31 DIAGNOSIS — Z66 Do not resuscitate: Secondary | ICD-10-CM | POA: Diagnosis present

## 2020-03-31 DIAGNOSIS — D508 Other iron deficiency anemias: Secondary | ICD-10-CM | POA: Diagnosis not present

## 2020-03-31 DIAGNOSIS — R404 Transient alteration of awareness: Secondary | ICD-10-CM | POA: Diagnosis not present

## 2020-03-31 DIAGNOSIS — E785 Hyperlipidemia, unspecified: Secondary | ICD-10-CM | POA: Diagnosis present

## 2020-03-31 DIAGNOSIS — I350 Nonrheumatic aortic (valve) stenosis: Secondary | ICD-10-CM | POA: Diagnosis not present

## 2020-03-31 DIAGNOSIS — I130 Hypertensive heart and chronic kidney disease with heart failure and stage 1 through stage 4 chronic kidney disease, or unspecified chronic kidney disease: Secondary | ICD-10-CM | POA: Diagnosis not present

## 2020-03-31 DIAGNOSIS — J810 Acute pulmonary edema: Secondary | ICD-10-CM | POA: Diagnosis not present

## 2020-03-31 DIAGNOSIS — Z9181 History of falling: Secondary | ICD-10-CM

## 2020-03-31 DIAGNOSIS — R32 Unspecified urinary incontinence: Secondary | ICD-10-CM | POA: Diagnosis present

## 2020-03-31 DIAGNOSIS — D649 Anemia, unspecified: Secondary | ICD-10-CM

## 2020-03-31 DIAGNOSIS — I517 Cardiomegaly: Secondary | ICD-10-CM | POA: Diagnosis not present

## 2020-03-31 DIAGNOSIS — Z7984 Long term (current) use of oral hypoglycemic drugs: Secondary | ICD-10-CM

## 2020-03-31 DIAGNOSIS — G8929 Other chronic pain: Secondary | ICD-10-CM | POA: Diagnosis present

## 2020-03-31 DIAGNOSIS — N400 Enlarged prostate without lower urinary tract symptoms: Secondary | ICD-10-CM | POA: Diagnosis present

## 2020-03-31 DIAGNOSIS — J439 Emphysema, unspecified: Secondary | ICD-10-CM | POA: Diagnosis present

## 2020-03-31 DIAGNOSIS — R0902 Hypoxemia: Secondary | ICD-10-CM

## 2020-03-31 DIAGNOSIS — Z043 Encounter for examination and observation following other accident: Secondary | ICD-10-CM | POA: Diagnosis not present

## 2020-03-31 DIAGNOSIS — J90 Pleural effusion, not elsewhere classified: Secondary | ICD-10-CM | POA: Diagnosis not present

## 2020-03-31 DIAGNOSIS — I340 Nonrheumatic mitral (valve) insufficiency: Secondary | ICD-10-CM | POA: Diagnosis not present

## 2020-03-31 DIAGNOSIS — E1159 Type 2 diabetes mellitus with other circulatory complications: Secondary | ICD-10-CM | POA: Diagnosis present

## 2020-03-31 DIAGNOSIS — E1122 Type 2 diabetes mellitus with diabetic chronic kidney disease: Secondary | ICD-10-CM | POA: Diagnosis present

## 2020-03-31 DIAGNOSIS — J449 Chronic obstructive pulmonary disease, unspecified: Secondary | ICD-10-CM | POA: Diagnosis not present

## 2020-03-31 DIAGNOSIS — R040 Epistaxis: Secondary | ICD-10-CM | POA: Diagnosis not present

## 2020-03-31 DIAGNOSIS — R5381 Other malaise: Secondary | ICD-10-CM | POA: Diagnosis present

## 2020-03-31 DIAGNOSIS — N289 Disorder of kidney and ureter, unspecified: Secondary | ICD-10-CM

## 2020-03-31 DIAGNOSIS — M109 Gout, unspecified: Secondary | ICD-10-CM | POA: Diagnosis present

## 2020-03-31 DIAGNOSIS — I959 Hypotension, unspecified: Secondary | ICD-10-CM | POA: Diagnosis not present

## 2020-03-31 DIAGNOSIS — J9622 Acute and chronic respiratory failure with hypercapnia: Secondary | ICD-10-CM | POA: Diagnosis present

## 2020-03-31 DIAGNOSIS — I272 Pulmonary hypertension, unspecified: Secondary | ICD-10-CM | POA: Diagnosis present

## 2020-03-31 DIAGNOSIS — I509 Heart failure, unspecified: Secondary | ICD-10-CM | POA: Diagnosis not present

## 2020-03-31 DIAGNOSIS — Z20822 Contact with and (suspected) exposure to covid-19: Secondary | ICD-10-CM | POA: Diagnosis present

## 2020-03-31 DIAGNOSIS — N179 Acute kidney failure, unspecified: Secondary | ICD-10-CM | POA: Diagnosis not present

## 2020-03-31 DIAGNOSIS — J984 Other disorders of lung: Secondary | ICD-10-CM | POA: Diagnosis not present

## 2020-03-31 DIAGNOSIS — R0602 Shortness of breath: Secondary | ICD-10-CM | POA: Diagnosis not present

## 2020-03-31 DIAGNOSIS — D500 Iron deficiency anemia secondary to blood loss (chronic): Secondary | ICD-10-CM | POA: Diagnosis not present

## 2020-03-31 DIAGNOSIS — I10 Essential (primary) hypertension: Secondary | ICD-10-CM | POA: Diagnosis not present

## 2020-03-31 DIAGNOSIS — Z9981 Dependence on supplemental oxygen: Secondary | ICD-10-CM | POA: Diagnosis not present

## 2020-03-31 DIAGNOSIS — R310 Gross hematuria: Secondary | ICD-10-CM | POA: Diagnosis present

## 2020-03-31 DIAGNOSIS — R0609 Other forms of dyspnea: Secondary | ICD-10-CM | POA: Diagnosis present

## 2020-03-31 DIAGNOSIS — W19XXXA Unspecified fall, initial encounter: Secondary | ICD-10-CM | POA: Diagnosis present

## 2020-03-31 DIAGNOSIS — Z87891 Personal history of nicotine dependence: Secondary | ICD-10-CM | POA: Diagnosis not present

## 2020-03-31 DIAGNOSIS — Z79899 Other long term (current) drug therapy: Secondary | ICD-10-CM

## 2020-03-31 DIAGNOSIS — Y92239 Unspecified place in hospital as the place of occurrence of the external cause: Secondary | ICD-10-CM | POA: Diagnosis not present

## 2020-03-31 DIAGNOSIS — J9621 Acute and chronic respiratory failure with hypoxia: Secondary | ICD-10-CM | POA: Diagnosis present

## 2020-03-31 DIAGNOSIS — I951 Orthostatic hypotension: Secondary | ICD-10-CM | POA: Diagnosis present

## 2020-03-31 DIAGNOSIS — I361 Nonrheumatic tricuspid (valve) insufficiency: Secondary | ICD-10-CM | POA: Diagnosis not present

## 2020-03-31 DIAGNOSIS — Z833 Family history of diabetes mellitus: Secondary | ICD-10-CM

## 2020-03-31 DIAGNOSIS — Z7982 Long term (current) use of aspirin: Secondary | ICD-10-CM | POA: Diagnosis not present

## 2020-03-31 DIAGNOSIS — T502X5A Adverse effect of carbonic-anhydrase inhibitors, benzothiadiazides and other diuretics, initial encounter: Secondary | ICD-10-CM | POA: Diagnosis not present

## 2020-03-31 DIAGNOSIS — R911 Solitary pulmonary nodule: Secondary | ICD-10-CM | POA: Diagnosis present

## 2020-03-31 DIAGNOSIS — G4733 Obstructive sleep apnea (adult) (pediatric): Secondary | ICD-10-CM | POA: Diagnosis present

## 2020-03-31 DIAGNOSIS — E1169 Type 2 diabetes mellitus with other specified complication: Secondary | ICD-10-CM | POA: Diagnosis present

## 2020-03-31 DIAGNOSIS — D509 Iron deficiency anemia, unspecified: Principal | ICD-10-CM | POA: Diagnosis present

## 2020-03-31 DIAGNOSIS — D631 Anemia in chronic kidney disease: Secondary | ICD-10-CM | POA: Diagnosis present

## 2020-03-31 DIAGNOSIS — J8410 Pulmonary fibrosis, unspecified: Secondary | ICD-10-CM | POA: Diagnosis present

## 2020-03-31 DIAGNOSIS — E876 Hypokalemia: Secondary | ICD-10-CM | POA: Diagnosis not present

## 2020-03-31 DIAGNOSIS — E119 Type 2 diabetes mellitus without complications: Secondary | ICD-10-CM

## 2020-03-31 DIAGNOSIS — R0603 Acute respiratory distress: Secondary | ICD-10-CM | POA: Diagnosis present

## 2020-03-31 DIAGNOSIS — R55 Syncope and collapse: Secondary | ICD-10-CM | POA: Diagnosis not present

## 2020-03-31 DIAGNOSIS — M2550 Pain in unspecified joint: Secondary | ICD-10-CM | POA: Diagnosis not present

## 2020-03-31 DIAGNOSIS — Z6828 Body mass index (BMI) 28.0-28.9, adult: Secondary | ICD-10-CM

## 2020-03-31 DIAGNOSIS — Z7401 Bed confinement status: Secondary | ICD-10-CM | POA: Diagnosis not present

## 2020-03-31 DIAGNOSIS — J84112 Idiopathic pulmonary fibrosis: Secondary | ICD-10-CM | POA: Diagnosis present

## 2020-03-31 LAB — I-STAT ARTERIAL BLOOD GAS, ED
Acid-base deficit: 1 mmol/L (ref 0.0–2.0)
Bicarbonate: 24.5 mmol/L (ref 20.0–28.0)
Calcium, Ion: 1.22 mmol/L (ref 1.15–1.40)
HCT: 17 % — ABNORMAL LOW (ref 39.0–52.0)
Hemoglobin: 5.8 g/dL — CL (ref 13.0–17.0)
O2 Saturation: 96 %
Potassium: 4.3 mmol/L (ref 3.5–5.1)
Sodium: 139 mmol/L (ref 135–145)
TCO2: 26 mmol/L (ref 22–32)
pCO2 arterial: 43.7 mmHg (ref 32.0–48.0)
pH, Arterial: 7.356 (ref 7.350–7.450)
pO2, Arterial: 89 mmHg (ref 83.0–108.0)

## 2020-03-31 LAB — COMPREHENSIVE METABOLIC PANEL
ALT: 18 U/L (ref 0–44)
AST: 30 U/L (ref 15–41)
Albumin: 2.7 g/dL — ABNORMAL LOW (ref 3.5–5.0)
Alkaline Phosphatase: 47 U/L (ref 38–126)
Anion gap: 16 — ABNORMAL HIGH (ref 5–15)
BUN: 26 mg/dL — ABNORMAL HIGH (ref 8–23)
CO2: 17 mmol/L — ABNORMAL LOW (ref 22–32)
Calcium: 7.7 mg/dL — ABNORMAL LOW (ref 8.9–10.3)
Chloride: 106 mmol/L (ref 98–111)
Creatinine, Ser: 1.88 mg/dL — ABNORMAL HIGH (ref 0.61–1.24)
GFR, Estimated: 35 mL/min — ABNORMAL LOW (ref >60)
Glucose, Bld: 228 mg/dL — ABNORMAL HIGH (ref 70–99)
Potassium: 4.5 mmol/L (ref 3.5–5.1)
Sodium: 139 mmol/L (ref 135–145)
Total Bilirubin: 0.8 mg/dL (ref 0.3–1.2)
Total Protein: 5.6 g/dL — ABNORMAL LOW (ref 6.5–8.1)

## 2020-03-31 LAB — CBC WITH DIFFERENTIAL/PLATELET
Abs Immature Granulocytes: 0.04 10*3/uL (ref 0.00–0.07)
Basophils Absolute: 0 10*3/uL (ref 0.0–0.1)
Basophils Relative: 0 %
Eosinophils Absolute: 0.1 10*3/uL (ref 0.0–0.5)
Eosinophils Relative: 1 %
HCT: 19.8 % — ABNORMAL LOW (ref 39.0–52.0)
Hemoglobin: 5 g/dL — CL (ref 13.0–17.0)
Immature Granulocytes: 1 %
Lymphocytes Relative: 14 %
Lymphs Abs: 0.9 10*3/uL (ref 0.7–4.0)
MCH: 22.2 pg — ABNORMAL LOW (ref 26.0–34.0)
MCHC: 25.3 g/dL — ABNORMAL LOW (ref 30.0–36.0)
MCV: 88 fL (ref 80.0–100.0)
Monocytes Absolute: 0.3 10*3/uL (ref 0.1–1.0)
Monocytes Relative: 5 %
Neutro Abs: 4.8 10*3/uL (ref 1.7–7.7)
Neutrophils Relative %: 79 %
Platelets: 213 10*3/uL (ref 150–400)
RBC: 2.25 MIL/uL — ABNORMAL LOW (ref 4.22–5.81)
RDW: 18 % — ABNORMAL HIGH (ref 11.5–15.5)
WBC: 6.1 10*3/uL (ref 4.0–10.5)
nRBC: 0.8 % — ABNORMAL HIGH (ref 0.0–0.2)

## 2020-03-31 LAB — RETICULOCYTES
Immature Retic Fract: 30.3 % — ABNORMAL HIGH (ref 2.3–15.9)
Immature Retic Fract: 34.7 % — ABNORMAL HIGH (ref 2.3–15.9)
RBC.: 2.24 MIL/uL — ABNORMAL LOW (ref 4.22–5.81)
RBC.: 2.46 MIL/uL — ABNORMAL LOW (ref 4.22–5.81)
Retic Count, Absolute: 50.6 10*3/uL (ref 19.0–186.0)
Retic Count, Absolute: 44 10*3/uL (ref 19.0–186.0)
Retic Ct Pct: 2.3 % (ref 0.4–3.1)
Retic Ct Pct: 1.8 % (ref 0.4–3.1)

## 2020-03-31 LAB — CBC
HCT: 24.2 % — ABNORMAL LOW (ref 39.0–52.0)
Hemoglobin: 6.9 g/dL — CL (ref 13.0–17.0)
MCH: 23.9 pg — ABNORMAL LOW (ref 26.0–34.0)
MCHC: 28.5 g/dL — ABNORMAL LOW (ref 30.0–36.0)
MCV: 83.7 fL (ref 80.0–100.0)
Platelets: 197 10*3/uL (ref 150–400)
RBC: 2.89 MIL/uL — ABNORMAL LOW (ref 4.22–5.81)
RDW: 17.2 % — ABNORMAL HIGH (ref 11.5–15.5)
WBC: 7 10*3/uL (ref 4.0–10.5)
nRBC: 0.6 % — ABNORMAL HIGH (ref 0.0–0.2)

## 2020-03-31 LAB — HEMOGLOBIN A1C
Hgb A1c MFr Bld: 5.7 % — ABNORMAL HIGH (ref 4.8–5.6)
Mean Plasma Glucose: 116.89 mg/dL

## 2020-03-31 LAB — IRON AND TIBC
Iron: 20 ug/dL — ABNORMAL LOW (ref 45–182)
Iron: 20 ug/dL — ABNORMAL LOW (ref 45–182)
Saturation Ratios: 5 % — ABNORMAL LOW (ref 17.9–39.5)
Saturation Ratios: 5 % — ABNORMAL LOW (ref 17.9–39.5)
TIBC: 410 ug/dL (ref 250–450)
TIBC: 405 ug/dL (ref 250–450)
UIBC: 390 ug/dL
UIBC: 385 ug/dL

## 2020-03-31 LAB — TSH: TSH: 2.999 u[IU]/mL (ref 0.350–4.500)

## 2020-03-31 LAB — RESP PANEL BY RT-PCR (FLU A&B, COVID) ARPGX2
Influenza A by PCR: NEGATIVE
Influenza B by PCR: NEGATIVE
SARS Coronavirus 2 by RT PCR: NEGATIVE

## 2020-03-31 LAB — TROPONIN I (HIGH SENSITIVITY)
Troponin I (High Sensitivity): 19 ng/L — ABNORMAL HIGH (ref <18)
Troponin I (High Sensitivity): 21 ng/L — ABNORMAL HIGH (ref <18)

## 2020-03-31 LAB — FERRITIN
Ferritin: 11 ng/mL — ABNORMAL LOW (ref 24–336)
Ferritin: 11 ng/mL — ABNORMAL LOW (ref 24–336)

## 2020-03-31 LAB — VITAMIN B12
Vitamin B-12: 276 pg/mL (ref 180–914)
Vitamin B-12: 257 pg/mL (ref 180–914)

## 2020-03-31 LAB — POC OCCULT BLOOD, ED: Fecal Occult Bld: NEGATIVE

## 2020-03-31 LAB — PREPARE RBC (CROSSMATCH)

## 2020-03-31 LAB — FOLATE
Folate: 26.6 ng/mL (ref >5.9)
Folate: 19.3 ng/mL (ref >5.9)

## 2020-03-31 LAB — GLUCOSE, CAPILLARY
Glucose-Capillary: 108 mg/dL — ABNORMAL HIGH (ref 70–99)
Glucose-Capillary: 111 mg/dL — ABNORMAL HIGH (ref 70–99)
Glucose-Capillary: 109 mg/dL — ABNORMAL HIGH (ref 70–99)

## 2020-03-31 MED ORDER — DOXAZOSIN MESYLATE 2 MG PO TABS
4.0000 mg | ORAL_TABLET | Freq: Every day | ORAL | Status: DC
  Administered 2020-03-31 – 2020-04-13 (×14): 4 mg via ORAL
  Filled 2020-03-31 (×6): qty 2
  Filled 2020-03-31 (×2): qty 1
  Filled 2020-03-31: qty 2
  Filled 2020-03-31: qty 1
  Filled 2020-03-31 (×2): qty 2
  Filled 2020-03-31 (×2): qty 1
  Filled 2020-03-31: qty 2

## 2020-03-31 MED ORDER — LACTATED RINGERS IV SOLN: INTRAVENOUS | Status: DC

## 2020-03-31 MED ORDER — ACETAMINOPHEN 650 MG RE SUPP: 650.0000 mg | Freq: Four times a day (QID) | RECTAL | Status: DC | PRN

## 2020-03-31 MED ORDER — AMLODIPINE BESYLATE 10 MG PO TABS
10.0000 mg | ORAL_TABLET | Freq: Every day | ORAL | Status: DC
  Administered 2020-04-01 – 2020-04-04 (×3): 10 mg via ORAL
  Filled 2020-03-31 (×3): qty 1

## 2020-03-31 MED ORDER — ENOXAPARIN SODIUM 40 MG/0.4ML ~~LOC~~ SOLN
40.0000 mg | SUBCUTANEOUS | Status: DC
  Administered 2020-03-31 – 2020-04-07 (×8): 40 mg via SUBCUTANEOUS
  Filled 2020-03-31 (×8): qty 0.4

## 2020-03-31 MED ORDER — HYDROCODONE-ACETAMINOPHEN 5-325 MG PO TABS
1.0000 | ORAL_TABLET | Freq: Three times a day (TID) | ORAL | Status: DC | PRN
Start: 2020-03-31 — End: 2020-04-03

## 2020-03-31 MED ORDER — SODIUM CHLORIDE 0.9% FLUSH
3.0000 mL | Freq: Two times a day (BID) | INTRAVENOUS | Status: DC
Start: 2020-03-31 — End: 2020-04-14
  Administered 2020-03-31 – 2020-04-13 (×27): 3 mL via INTRAVENOUS

## 2020-03-31 MED ORDER — ATORVASTATIN CALCIUM 80 MG PO TABS
80.0000 mg | ORAL_TABLET | Freq: Every evening | ORAL | Status: DC
  Administered 2020-03-31 – 2020-04-13 (×13): 80 mg via ORAL
  Filled 2020-03-31 (×13): qty 1

## 2020-03-31 MED ORDER — ACETAMINOPHEN 325 MG PO TABS
650.0000 mg | ORAL_TABLET | Freq: Four times a day (QID) | ORAL | Status: DC | PRN
  Administered 2020-04-08 – 2020-04-11 (×2): 650 mg via ORAL
  Filled 2020-03-31 (×2): qty 2

## 2020-03-31 MED ORDER — ENOXAPARIN SODIUM 40 MG/0.4ML ~~LOC~~ SOLN: 40.0000 mg | SUBCUTANEOUS | Status: DC

## 2020-03-31 MED ORDER — ONDANSETRON HCL 4 MG/2ML IJ SOLN
4.0000 mg | Freq: Four times a day (QID) | INTRAMUSCULAR | Status: DC | PRN
  Filled 2020-03-31: qty 2

## 2020-03-31 MED ORDER — ONDANSETRON HCL 4 MG PO TABS: 4.0000 mg | ORAL_TABLET | Freq: Four times a day (QID) | ORAL | Status: DC | PRN

## 2020-03-31 MED ORDER — CARVEDILOL 12.5 MG PO TABS
12.5000 mg | ORAL_TABLET | Freq: Two times a day (BID) | ORAL | Status: DC
  Administered 2020-03-31 – 2020-04-02 (×5): 12.5 mg via ORAL
  Filled 2020-03-31: qty 1
  Filled 2020-03-31: qty 2
  Filled 2020-03-31 (×3): qty 1

## 2020-03-31 MED ORDER — INSULIN ASPART 100 UNIT/ML ~~LOC~~ SOLN: 0.0000 [IU] | Freq: Every day | SUBCUTANEOUS | Status: DC

## 2020-03-31 MED ORDER — ASPIRIN EC 81 MG PO TBEC
81.0000 mg | DELAYED_RELEASE_TABLET | Freq: Every day | ORAL | Status: DC
  Administered 2020-04-01 – 2020-04-13 (×12): 81 mg via ORAL
  Filled 2020-03-31 (×12): qty 1

## 2020-03-31 MED ORDER — INSULIN ASPART 100 UNIT/ML ~~LOC~~ SOLN
0.0000 [IU] | Freq: Three times a day (TID) | SUBCUTANEOUS | Status: DC
  Administered 2020-04-01 (×2): 2 [IU] via SUBCUTANEOUS
  Administered 2020-04-02 (×2): 3 [IU] via SUBCUTANEOUS
  Administered 2020-04-03: 2 [IU] via SUBCUTANEOUS
  Administered 2020-04-03: 3 [IU] via SUBCUTANEOUS

## 2020-03-31 MED ORDER — MORPHINE SULFATE (PF) 2 MG/ML IV SOLN: 2.0000 mg | INTRAVENOUS | Status: DC | PRN

## 2020-03-31 MED ORDER — SODIUM CHLORIDE 0.9 % IV SOLN
10.0000 mL/h | Freq: Once | INTRAVENOUS | Status: AC
  Administered 2020-04-05: 10 mL/h via INTRAVENOUS

## 2020-03-31 NOTE — ED Notes (Signed)
Pt taken off venti mask and is on 6L [place] at this time, will continue to monitor

## 2020-03-31 NOTE — ED Notes (Signed)
Pt placed back on venti mask for sats of 80% on 6L [place]

## 2020-03-31 NOTE — ED Notes (Signed)
EMT noted SpO2 to be 83% on monitor. RN to room SpO2 86%, dropped as low as 80% on same. 6L O2 in place in nares via [place]. RT contacted and Dr. Roxanne Mins made aware of same. RT to come assess.

## 2020-03-31 NOTE — ED Notes (Signed)
Dr. Vernell Leep and primary RN Zoe made aware of HGB of 5

## 2020-03-31 NOTE — ED Provider Notes (Signed)
Patient turned over to me by Dr. Roxanne Mins.  Patient comfortable on the Venturi mask.  Blood gas without any significant abnormalities.  On the Venturi mask patient's oxygen levels is in actually in the upper 90s.  He is mentating fine.  No respiratory distress.  Significant lowered hemoglobin 5.8.  Hemoccult negative from below.  This may have been gradual blood loss.  Dr. Roxanne Mins ordered blood transfusion.  Chest x-ray raises some concerns about pneumonia but clinically does not seem to have pneumonia initial troponin was elevated at 19.  Repeat ordered.  EKG without acute changes other than may be some minimal ST segment depression inferiorly.  Discussed with Dr. Lorin Mercy from hospitalist service she will see and admit.  Consult out to trauma ENT due to the fall with probable nasal fractures.  Without any significant deformity.  They have not called back yet.   Fredia Sorrow, MD 03/31/20 [phone]

## 2020-03-31 NOTE — ED Notes (Addendum)
Patient to CT.

## 2020-03-31 NOTE — H&P (Addendum)
History and Physical    Bradley Johns RSW:[id number] DOB: [birth year] DOA: 03/31/2020  PCP: Colon Branch, MD Consultants:  Posey Pronto - endocrinology; Elsworth Soho- pulmonology Patient coming from:  Home - lives with wife; Bradley Johns: Wife, [phone]; [phone]  Chief Complaint: Syncope  HPI: Bradley Johns is a 85 y.o. 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  He was sleeping for most of the evening but came downstairs for a snack about 0330.  He was heading back upstairs after a couple of hours and was pushing his oxygen tank when suddenly he was in the floor.  "They say that my oxygen level was terrible."   He does not remember feeling bad prior to the fall.  He pushes his oxygen tank when he goes up/down stairs.  He does remember feeling light-headed, dizzy, SOB prior to fall.  When he is doing strenuous exercise he gets SOB; this has been an issue for a while.  He is on 3-5L home O2, has been getting more SOB progressively.  He has intermittent BRB in his urine with clotting periodically, present for a while.  He doesn't leave his property at all for the last 2 years other than to doctor appointments.  When he gets up to walk within the house, he is very tremulous.  Sometimes he misjudges distances and almost falls - that may have happened today.  He does report some memory issues.    ED Course:  Syncope while walking with O2 tank.  Hgb 5, has had exertional SOB.  Heme negative.  Usually on 3.5L home O2, has needed as much as 6, currently on Venturi mask.  CXR with volume overload vs. PNA.  Troponin 19, no chest pain.  EKG unremarkable.  Has nasal fractures, ENT consult pending.  Review of Systems: As per HPI; otherwise review of systems reviewed and negative.   Ambulatory Status:  Ambulates without assistance or with a cane  COVID Vaccine Status:   Complete plus booster  Past Medical History:  Diagnosis Date  .  Arthritis   . Benign prostatic hypertrophy   . COPD (chronic obstructive pulmonary disease) ([place])    24/7 O2  . Diabetes mellitus    per Dr Posey Pronto  . Epidermal cyst of face    left lower lateral cheek  . Gout   . Hyperlipidemia   . Hypertension   . Pulmonary fibrosis ([place]) 2014  . Sleep apnea     Past Surgical History:  Procedure Laterality Date  . COLONOSCOPY WITH PROPOFOL N/A 05/18/2018   Procedure: COLONOSCOPY WITH PROPOFOL;  Surgeon: Carol Ada, MD;  Location: [facility];  Service: Endoscopy;  Laterality: N/A;  . POLYPECTOMY  05/18/2018   Procedure: POLYPECTOMY;  Surgeon: Carol Ada, MD;  Location: [facility];  Service: Endoscopy;;  . TOE SURGERY      Social History   Socioeconomic History  . Marital status: Married    Spouse name: Not on file  . Number of children: 2  . Years of education: Not on file  . Highest education level: Not on file  Occupational History  . Occupation: retired  Tobacco Use  . Smoking status: Former Smoker    Packs/day: 1.50    Years: 40.00    Pack years: 60.00    Types: Cigarettes    Quit date: 04/07/1996    Years since quitting: 23.9  . Smokeless tobacco: Never Used  Substance and Sexual Activity  .  Alcohol use: Yes    Comment: occassional beer  . Drug use: No  . Sexual activity: Yes  Other Topics Concern  . Not on file  Social History Narrative   Lives w/ wife   Social Determinants of Health   Financial Resource Strain: Not on file  Food Insecurity: Not on file  Transportation Needs: Not on file  Physical Activity: Not on file  Stress: Not on file  Social Connections: Not on file  Intimate Partner Violence: Not on file    No Known Allergies  Family History  Problem Relation Age of Onset  . Liver disease Mother   . Coronary artery disease Father 75  . Diabetes Sister   . Cancer Brother        type?  Marland Kitchen Hypertension Neg Hx   . Hyperlipidemia Neg Hx   . Heart attack Neg Hx   . Prostate cancer Neg Hx   .  Colon cancer Neg Hx     Prior to Admission medications   Medication Sig Start Date End Date Taking? Authorizing Provider  ACCU-CHEK AVIVA PLUS test strip Reported on 06/20/2015 12/13/14  Yes [provider]  ACCU-CHEK SOFTCLIX LANCETS lancets Reported on 06/20/2015 12/13/14  Yes [provider]  amLODipine (NORVASC) 10 MG tablet Take 1 tablet (10 mg total) by mouth daily. 04/15/17  Yes Colon Branch, MD  aspirin 81 MG tablet Take 1 tablet (81 mg total) by mouth daily. Start taking 05/27/18 05/27/18  Yes Pokhrel, Laxman, MD  atorvastatin (LIPITOR) 80 MG tablet Take 1 tablet (80 mg total) by mouth daily. Patient taking differently: Take 80 mg by mouth every evening. 04/15/17  Yes Colon Branch, MD  B-D UF III MINI PEN NEEDLES 31G X 5 MM MISC Reported on 06/20/2015 11/07/14  Yes [provider]  Blood Glucose Monitoring Suppl (ACCU-CHEK AVIVA PLUS) W/DEVICE KIT Reported on 06/20/2015 10/27/14  Yes [provider]  carvedilol (COREG) 12.5 MG tablet Take 1 tablet (12.5 mg total) by mouth 2 (two) times daily with a meal. 06/12/17  Yes Paz, Alda Berthold, MD  colchicine 0.6 MG tablet Take 1 tablet (0.6 mg total) by mouth 2 (two) times daily as needed. 12/18/17  Yes Paz, Alda Berthold, MD  doxazosin (CARDURA) 4 MG tablet Take 1 tablet (4 mg total) by mouth at bedtime. 03/01/19  Yes Paz, Alda Berthold, MD  ketoconazole (NIZORAL) 2 % cream Apply 1 application topically daily. Patient taking differently: Apply 1 application topically daily as needed (moisture around groin). 03/24/18  Yes Paz, Alda Berthold, MD  losartan (COZAAR) 50 MG tablet Take 1 tablet (50 mg total) by mouth daily. 06/12/17  Yes Paz, Alda Berthold, MD  metFORMIN (GLUCOPHAGE) 500 MG tablet Take 500 mg by mouth 2 (two) times daily with a meal.   Yes [provider]  Multiple Vitamin (MULTIVITAMIN) tablet Take 1 tablet by mouth daily.   Yes [provider]  OXYGEN Inhale into the lungs. 3L Nocturnal and PRN for Hypoxemia   Yes [provider]  pioglitazone (ACTOS) 45 MG tablet Take 45 mg by mouth daily.   Yes [provider]  HYDROcodone-acetaminophen (NORCO/VICODIN) 5-325 MG tablet Take 1 tablet by mouth every 8 (eight) hours as needed. 07/27/18   Colon Branch, MD    Physical Exam: Vitals:   03/31/20 0645 03/31/20 0715 03/31/20 0812 03/31/20 0940  BP: (!) 124/49 (!) 128/50  (!) 158/66  Pulse: 79 79 72 78  Resp: (!) $RemoveB'23 16 20 'wpPzdYRu$ 20  Temp:    97.7 F (36.5 C)  TempSrc:    Oral  SpO2: 92% 90% 97%   Weight:      Height:         . General:  Appears calm and comfortable and is in NAD, on Venti mask and conversant without difficulty . Eyes:  PERRL, EOMI, normal lids, iris . ENT:  grossly normal hearing, lips & tongue, mmm; poor/mostly absent dentition . Neck:  no LAD, masses or thyromegaly . Cardiovascular:  RRR, no m/r/g. No LE edema.  Marland Kitchen Respiratory:   Scattered rhonchi.  Normal respiratory effort. . Abdomen:  soft, NT, ND . Skin:  no rash or induration seen on limited exam . Musculoskeletal:  grossly normal tone BUE/BLE, good ROM, no bony abnormality . Psychiatric:  grossly normal mood and affect, speech fluent and appropriate, AOx3 . Neurologic:  CN 2-12 grossly intact, moves all extremities in coordinated fashion    Radiological Exams on Admission: Independently reviewed - see discussion in A/P where applicable  CT Head Wo Contrast  Result Date: 03/31/2020 CLINICAL DATA:  Unwitnessed fall. EXAM: CT HEAD WITHOUT CONTRAST CT MAXILLOFACIAL WITHOUT CONTRAST CT CERVICAL SPINE WITHOUT CONTRAST TECHNIQUE: Multidetector CT imaging of the head, cervical spine, and maxillofacial structures were performed using the standard protocol without intravenous contrast. Multiplanar CT image reconstructions of the cervical spine and maxillofacial structures were also generated. COMPARISON:  None. FINDINGS: CT HEAD FINDINGS Brain: There is no evidence for acute hemorrhage, hydrocephalus, mass lesion, or abnormal  extra-axial fluid collection. No definite CT evidence for acute infarction. Diffuse loss of parenchymal volume is consistent with atrophy. Patchy low attenuation in the deep hemispheric and periventricular white matter is nonspecific, but likely reflects chronic microvascular ischemic demyelination. Vascular: No hyperdense vessel or unexpected calcification. Skull: No evidence for fracture. No worrisome lytic or sclerotic lesion. Other: None. CT MAXILLOFACIAL FINDINGS Osseous: Age indeterminate bilateral nasal bone fractures evident. No other acute facial bone fracture. Mandible is intact. Temporomandibular joints are located with some mild degenerative changes in the left TMJ. Orbits: Negative. No traumatic or inflammatory finding. Sinuses: Air-fluid level noted right maxillary sinus. Remaining paranasal sinuses and mastoid air cells are clear. Soft tissues: Negative. CT CERVICAL SPINE FINDINGS Alignment: Reversal of normal cervical lordosis without subluxation. Skull base and vertebrae: No acute fracture. No primary bone lesion or focal pathologic process. Soft tissues and spinal canal: No prevertebral fluid or swelling. No visible canal hematoma. Disc levels: Loss of disc height noted diffusely in the cervical spine from C4-5 down to C6-7 with multilevel associated endplate spurring. Upper chest: Aberrant origin right subclavian artery noted. Emphysema noted in the lung apices with 10 mm irregular right apical nodule visible on image 92 of series 4. Other: None. IMPRESSION: 1. No acute intracranial abnormality. Atrophy with chronic small vessel white matter ischemic disease. 2. Age indeterminate bilateral nasal bone fractures. No other acute facial bone fracture. 3. Air-fluid level in the right maxillary sinus. This could be related to hemorrhage in the setting of acute trauma or acute sinusitis. 4. Degenerative changes in the cervical spine without fracture. 5. 10 mm irregular right apical pulmonary nodule.  Follow-up CT chest without contrast in 3 months recommended to ensure stability as this may be related to scarring. PET-CT could also be used to further evaluate as clinically warranted. 6. Aberrant origin right subclavian artery. 7. Reversal of normal cervical lordosis. This can be related to patient positioning, muscle spasm or soft tissue injury. 8. Emphysema (ICD10-J43.9). Electronically Signed  By: Misty Stanley M.D.   On: 03/31/2020 08:05   CT Cervical Spine Wo Contrast  Result Date: 03/31/2020 CLINICAL DATA:  Unwitnessed fall. EXAM: CT HEAD WITHOUT CONTRAST CT MAXILLOFACIAL WITHOUT CONTRAST CT CERVICAL SPINE WITHOUT CONTRAST TECHNIQUE: Multidetector CT imaging of the head, cervical spine, and maxillofacial structures were performed using the standard protocol without intravenous contrast. Multiplanar CT image reconstructions of the cervical spine and maxillofacial structures were also generated. COMPARISON:  None. FINDINGS: CT HEAD FINDINGS Brain: There is no evidence for acute hemorrhage, hydrocephalus, mass lesion, or abnormal extra-axial fluid collection. No definite CT evidence for acute infarction. Diffuse loss of parenchymal volume is consistent with atrophy. Patchy low attenuation in the deep hemispheric and periventricular white matter is nonspecific, but likely reflects chronic microvascular ischemic demyelination. Vascular: No hyperdense vessel or unexpected calcification. Skull: No evidence for fracture. No worrisome lytic or sclerotic lesion. Other: None. CT MAXILLOFACIAL FINDINGS Osseous: Age indeterminate bilateral nasal bone fractures evident. No other acute facial bone fracture. Mandible is intact. Temporomandibular joints are located with some mild degenerative changes in the left TMJ. Orbits: Negative. No traumatic or inflammatory finding. Sinuses: Air-fluid level noted right maxillary sinus. Remaining paranasal sinuses and mastoid air cells are clear. Soft tissues: Negative. CT  CERVICAL SPINE FINDINGS Alignment: Reversal of normal cervical lordosis without subluxation. Skull base and vertebrae: No acute fracture. No primary bone lesion or focal pathologic process. Soft tissues and spinal canal: No prevertebral fluid or swelling. No visible canal hematoma. Disc levels: Loss of disc height noted diffusely in the cervical spine from C4-5 down to C6-7 with multilevel associated endplate spurring. Upper chest: Aberrant origin right subclavian artery noted. Emphysema noted in the lung apices with 10 mm irregular right apical nodule visible on image 92 of series 4. Other: None. IMPRESSION: 1. No acute intracranial abnormality. Atrophy with chronic small vessel white matter ischemic disease. 2. Age indeterminate bilateral nasal bone fractures. No other acute facial bone fracture. 3. Air-fluid level in the right maxillary sinus. This could be related to hemorrhage in the setting of acute trauma or acute sinusitis. 4. Degenerative changes in the cervical spine without fracture. 5. 10 mm irregular right apical pulmonary nodule. Follow-up CT chest without contrast in 3 months recommended to ensure stability as this may be related to scarring. PET-CT could also be used to further evaluate as clinically warranted. 6. Aberrant origin right subclavian artery. 7. Reversal of normal cervical lordosis. This can be related to patient positioning, muscle spasm or soft tissue injury. 8. Emphysema (ICD10-J43.9). Electronically Signed   By: Misty Stanley M.D.   On: 03/31/2020 08:05   DG Chest Port 1 View  Result Date: 03/31/2020 CLINICAL DATA:  Syncope EXAM: PORTABLE CHEST 1 VIEW COMPARISON:  05/22/2018 FINDINGS: 0733 hours. The cardio pericardial silhouette is enlarged. There is pulmonary vascular congestion without overt pulmonary edema. Patchy bibasilar airspace disease noted. No pleural effusion. The visualized bony structures of the thorax show no acute abnormality. Telemetry leads overlie the chest.  IMPRESSION: 1. Enlargement of the cardio pericardial silhouette with pulmonary vascular congestion. 2. Diffuse interstitial lung disease with patchy bibasilar airspace opacity which may be related to atelectasis or pneumonia. Electronically Signed   By: Misty Stanley M.D.   On: 03/31/2020 07:36   CT Maxillofacial Wo Contrast  Result Date: 03/31/2020 CLINICAL DATA:  Unwitnessed fall. EXAM: CT HEAD WITHOUT CONTRAST CT MAXILLOFACIAL WITHOUT CONTRAST CT CERVICAL SPINE WITHOUT CONTRAST TECHNIQUE: Multidetector CT imaging of the head, cervical spine, and maxillofacial structures were performed  using the standard protocol without intravenous contrast. Multiplanar CT image reconstructions of the cervical spine and maxillofacial structures were also generated. COMPARISON:  None. FINDINGS: CT HEAD FINDINGS Brain: There is no evidence for acute hemorrhage, hydrocephalus, mass lesion, or abnormal extra-axial fluid collection. No definite CT evidence for acute infarction. Diffuse loss of parenchymal volume is consistent with atrophy. Patchy low attenuation in the deep hemispheric and periventricular white matter is nonspecific, but likely reflects chronic microvascular ischemic demyelination. Vascular: No hyperdense vessel or unexpected calcification. Skull: No evidence for fracture. No worrisome lytic or sclerotic lesion. Other: None. CT MAXILLOFACIAL FINDINGS Osseous: Age indeterminate bilateral nasal bone fractures evident. No other acute facial bone fracture. Mandible is intact. Temporomandibular joints are located with some mild degenerative changes in the left TMJ. Orbits: Negative. No traumatic or inflammatory finding. Sinuses: Air-fluid level noted right maxillary sinus. Remaining paranasal sinuses and mastoid air cells are clear. Soft tissues: Negative. CT CERVICAL SPINE FINDINGS Alignment: Reversal of normal cervical lordosis without subluxation. Skull base and vertebrae: No acute fracture. No primary bone lesion  or focal pathologic process. Soft tissues and spinal canal: No prevertebral fluid or swelling. No visible canal hematoma. Disc levels: Loss of disc height noted diffusely in the cervical spine from C4-5 down to C6-7 with multilevel associated endplate spurring. Upper chest: Aberrant origin right subclavian artery noted. Emphysema noted in the lung apices with 10 mm irregular right apical nodule visible on image 92 of series 4. Other: None. IMPRESSION: 1. No acute intracranial abnormality. Atrophy with chronic small vessel white matter ischemic disease. 2. Age indeterminate bilateral nasal bone fractures. No other acute facial bone fracture. 3. Air-fluid level in the right maxillary sinus. This could be related to hemorrhage in the setting of acute trauma or acute sinusitis. 4. Degenerative changes in the cervical spine without fracture. 5. 10 mm irregular right apical pulmonary nodule. Follow-up CT chest without contrast in 3 months recommended to ensure stability as this may be related to scarring. PET-CT could also be used to further evaluate as clinically warranted. 6. Aberrant origin right subclavian artery. 7. Reversal of normal cervical lordosis. This can be related to patient positioning, muscle spasm or soft tissue injury. 8. Emphysema (ICD10-J43.9). Electronically Signed   By: Kennith Center M.D.   On: 03/31/2020 08:05    EKG: Independently reviewed.  NSR with rate 76; nonspecific ST changes with no evidence of acute ischemia   Labs on Admission: I have personally reviewed the available labs and imaging studies at the time of the admission.  Pertinent labs:   CO2 17 Glucose 228 BUN 26/Creatinine 1.88/GFR 35; 14/1.09/78 in 05/2018 Anion gap 16 HS troponin 19 WBC 6.1 Hgb 5.0; 11.7 in 05/2018 ABG: 7.356/43.7/89 Heme negative   Assessment/Plan Principal Problem:   Symptomatic anemia Active Problems:   Diabetes mellitus type 2 in nonobese (HCC)   Hyperlipidemia   Essential hypertension    COPD with emphysema, on O2   BPH (benign prostatic hyperplasia)   Pulmonary fibrosis (HCC)   Chronic respiratory failure with hypoxia (HCC)   Syncope and collapse   AKI (acute kidney injury) (HCC)    Symptomatic anemia -Patient with known chronic respiratory failure, has been experiencing increasing dyspnea for some time -He has also been eating excessive amounts of ice -This AM, he had a syncopal event -He had a fall resulting in nasal fractures associated with the syncope -In the ER, he was found to be quite anemic -Hgb 5.0, last prior was almost 2 years ago; it  is possible that some of his anemia is related to his epistaxis from the fall but his escalating SOB, lightheadedness prior to event, and ice consumption indicate anemia which was likely a cause of his fall -MCV 88, RDW 18 -MCV is normal, indicative of normocytic anemia; this generally occurs in chronic disease and acute blood loss anemia and he is heme negative -Most likely source of the anemia appears to be chronic disease, likely associated with his chronic respiratory failure -Will add TSH testing -Will admit to med surg bed. -Transfuse 2 units PRBC to start and recheck Hgb afterwards.    Syncope -Etiology is most likely associated with his profound and apparently symptomatic anemia -He also has AKI and so orthostatic syncope is also a consideration -Severity of the injury sustained during syncope does not correlate with the etiology of syncope, but rather is a manifestation of activity around the time of syncope. -Orthostatic vital signs now and in AM -Trending troponin -Neuro checks  -Continue ASA -PT/OT eval and treat  Acute on Chronic respiratory failure with hypoxia due to COPD, pulmonary fibrosis -Patient is on chronic [place] O2, generally 3-4.5L  -He has advanced COPD and pulmonary fibrosis -He does not provide history suggestive of PNA and does not have other evidence of volume overload currently -Increased O2  requirement is thought to be related to syncope and symptomatic anemia  AKI -Last creatinine was normal almost 2 years ago -Currently with apparent AKI -Will give IVF in addition to blood products -Recheck BMP in AM -Possibly also a component of ATN associated with Cozaar and Glucophage use; will hold  DM -Will check A1c; last was in 6.9 in 05/2018 -hold Glucophage, Actos -Cover with moderate-scale SSI  HTN -Continue Norvasc, Coreg -Hold Cozaar in the setting of AKI  HLD -Continue Lipitor  BPH -Continue Cardura -He has chronic urinary incontinence and wears Depends  Chronic pain -I have reviewed this patient in the [place] Controlled Substances Reporting System.  He is not taking chronic controlled substances.    Note: This patient has been tested and is negative for the novel coronavirus COVID-19. The patient has been fully vaccinated against COVID-19.    DVT prophylaxis: Lovenox  Code Status:  DNR - confirmed with patient Family Communication: None present Disposition Plan:  The patient is from: home  Anticipated d/c is to: home without [facility] services  Anticipated d/c date will depend on clinical response to treatment, likely 2-3 days Patient is currently: acutely ill Consults called: PT/OT  Admission status:  Admit - It is my clinical opinion that admission to [place] [facility] is reasonable and necessary because of the expectation that this patient will require hospital care that crosses at least 2 midnights to treat this condition based on the medical complexity of the problems presented.  Given the aforementioned information, the predictability of an adverse outcome is felt to be significant.    Karmen Bongo MD [facility]   How to contact the [facility] Attending or Consulting provider [place] or covering provider during after hours [place], for this patient?  1. Check the care team in [facility] and look for a) attending/consulting [facility] provider listed and b) the [facility] team  listed 2. Log into [email or web address] and use [place]'s universal password to access. If you do not have the password, please contact the hospital operator. 3. Locate the [facility] provider you are looking for under [facility] and page to a number that you can be directly reached. 4. If you still have  difficulty reaching the provider, please page the [facility] (Director on Call) for the Hospitalists listed on amion for assistance.   03/31/2020, 9:48 AM

## 2020-03-31 NOTE — Progress Notes (Signed)
CRITICAL VALUE ALERT  Critical Value:  Hbg 6.9  Date & Time Notied:  8563  Provider Notified: [id number]  Orders Received/Actions taken: waiting for one

## 2020-03-31 NOTE — ED Provider Notes (Addendum)
[facility] EMERGENCY DEPARTMENT Provider Note   CSN: [id number] Arrival date & time: 03/31/20  0602   History Chief complaint: Syncope  Bradley Johns is a 85 y.o. male.  The history is provided by the patient and the EMS personnel.  He has history of hypertension, diabetes, hyperlipidemia, COPD and comes in following a syncopal episode at home.  He states that he was pushing his oxygen tank when he got lightheaded and passed out.  He apparently fell on his face and did have a nosebleed.  He is oxygen dependent and has noted for the last several months that he has to increase his oxygen if he does any kind of minimal exertion.  He denies any chest pain, heaviness, tightness, pressure.  He denies any palpitations.  He has had a slight cough which is nonproductive.  He denies fever but has had chills and sweats.  He has been vaccinated against COVID-19.  EMS noted oxygen saturation in the 70s and put him on additional oxygen, eventually needing to put him on a nonrebreather mask.  Past Medical History:  Diagnosis Date  . Arthritis   . Benign prostatic hypertrophy   . COPD (chronic obstructive pulmonary disease) ([place])    24/7 O2  . Diabetes mellitus    per Dr Posey Pronto  . Epidermal cyst of face    left lower lateral cheek  . Gout   . Hyperlipidemia   . Hypertension   . Pulmonary fibrosis ([place]) 2014  . Sleep apnea     Patient Active Problem List   Diagnosis Date Noted  . Chronic respiratory failure with hypoxia ([place]) 06/03/2018  . Anemia associated with acute blood loss 05/20/2018  . Acute GI bleeding 05/17/2018  . PCP NOTES >>>>>>>>>>>>>>>. 12/30/2014  . Pulmonary fibrosis ([place]) 12/14/2012  . Cubital tunnel syndrome on left 11/14/2012  . Trigger finger of both hands 09/26/2011  . Annual physical exam 03/18/2011  . BPH (benign prostatic hyperplasia) 07/11/2008  . Osteoarthritis 04/18/2008  . Hyperlipidemia 10/06/2006  . DIABETES MELLITUS, TYPE II 04/29/2006   . GOUT 04/29/2006  . Essential hypertension 04/29/2006  . COPD with emphysema, on O2 04/29/2006  . Sleep apnea 04/29/2006    Past Surgical History:  Procedure Laterality Date  . COLONOSCOPY WITH PROPOFOL N/A 05/18/2018   Procedure: COLONOSCOPY WITH PROPOFOL;  Surgeon: Carol Ada, MD;  Location: [facility] ENDOSCOPY;  Service: Endoscopy;  Laterality: N/A;  . POLYPECTOMY  05/18/2018   Procedure: POLYPECTOMY;  Surgeon: Carol Ada, MD;  Location: [facility] ENDOSCOPY;  Service: Endoscopy;;  . TOE SURGERY         Family History  Problem Relation Age of Onset  . Liver disease Mother   . Coronary artery disease Father 1  . Diabetes Sister   . Cancer Brother        type?  Marland Kitchen Hypertension Neg Hx   . Hyperlipidemia Neg Hx   . Heart attack Neg Hx   . Prostate cancer Neg Hx   . Colon cancer Neg Hx     Social History   Tobacco Use  . Smoking status: Former Smoker    Packs/day: 1.00    Years: 30.00    Pack years: 30.00    Types: Cigarettes    Quit date: 04/07/1996    Years since quitting: 23.9  . Smokeless tobacco: Never Used  Substance Use Topics  . Alcohol use: Yes    Comment: occassional beer  . Drug use: No    Home Medications Prior to  Admission medications   Medication Sig Start Date End Date Taking? Authorizing Provider  ACCU-CHEK AVIVA PLUS test strip Reported on 06/20/2015 12/13/14  Yes [provider]  ACCU-CHEK SOFTCLIX LANCETS lancets Reported on 06/20/2015 12/13/14  Yes [provider]  amLODipine (NORVASC) 10 MG tablet Take 1 tablet (10 mg total) by mouth daily. 04/15/17  Yes Colon Branch, MD  aspirin 81 MG tablet Take 1 tablet (81 mg total) by mouth daily. Start taking 05/27/18 05/27/18  Yes Pokhrel, Laxman, MD  atorvastatin (LIPITOR) 80 MG tablet Take 1 tablet (80 mg total) by mouth daily. Patient taking differently: Take 80 mg by mouth every evening. 04/15/17  Yes Colon Branch, MD  B-D UF III MINI PEN NEEDLES 31G X 5 MM MISC Reported on 06/20/2015 11/07/14  Yes  [provider]  Blood Glucose Monitoring Suppl (ACCU-CHEK AVIVA PLUS) W/DEVICE KIT Reported on 06/20/2015 10/27/14  Yes [provider]  carvedilol (COREG) 12.5 MG tablet Take 1 tablet (12.5 mg total) by mouth 2 (two) times daily with a meal. 06/12/17  Yes Paz, Alda Berthold, MD  colchicine 0.6 MG tablet Take 1 tablet (0.6 mg total) by mouth 2 (two) times daily as needed. 12/18/17  Yes Paz, Alda Berthold, MD  doxazosin (CARDURA) 4 MG tablet Take 1 tablet (4 mg total) by mouth at bedtime. 03/01/19  Yes Paz, Alda Berthold, MD  ketoconazole (NIZORAL) 2 % cream Apply 1 application topically daily. Patient taking differently: Apply 1 application topically daily as needed (moisture around groin). 03/24/18  Yes Paz, Alda Berthold, MD  losartan (COZAAR) 50 MG tablet Take 1 tablet (50 mg total) by mouth daily. 06/12/17  Yes Paz, Alda Berthold, MD  metFORMIN (GLUCOPHAGE) 500 MG tablet Take 500 mg by mouth 2 (two) times daily with a meal.   Yes [provider]  Multiple Vitamin (MULTIVITAMIN) tablet Take 1 tablet by mouth daily.   Yes [provider]  OXYGEN Inhale into the lungs. 3L Nocturnal and PRN for Hypoxemia   Yes [provider]  pioglitazone (ACTOS) 45 MG tablet Take 45 mg by mouth daily.   Yes [provider]  HYDROcodone-acetaminophen (NORCO/VICODIN) 5-325 MG tablet Take 1 tablet by mouth every 8 (eight) hours as needed. 07/27/18   Colon Branch, MD    Allergies    Patient has no known allergies.  Review of Systems   Review of Systems  All other systems reviewed and are negative.   Physical Exam Updated Vital Signs BP (!) 143/57 (BP Location: Left Arm)   Pulse 69   Temp (!) 97.3 F (36.3 C) (Oral)   Resp 20   Ht _0  (1.727 m)   Wt 86.2 kg   SpO2 87%   BMI 28.89 kg/m   Physical Exam Vitals and nursing note reviewed.   85 year old male, resting comfortably and in no acute distress. Vital signs are significant for borderline elevated blood pressure. Oxygen saturation  is 87%, which is hypoxic. Head is normocephalic. PERRLA, EOMI. Oropharynx is clear.  Dried blood is seen around the nose without active bleeding.  No significant nasal swelling. Neck is nontender and supple without adenopathy or JVD.  There are no carotid bruits. Back is nontender and there is no CVA tenderness. Lungs are clear without rales, wheezes, or rhonchi. Chest is nontender. Heart has regular rate and rhythm without murmur. Abdomen is soft, flat, nontender without masses or hepatosplenomegaly and peristalsis is normoactive. Extremities have no cyanosis or edema, full range of  motion is present. Skin is warm and dry without rash. Neurologic: Mental status is normal, cranial nerves are intact, moves all extremities equally.  ED Results / Procedures / Treatments   Labs (all labs ordered are listed, but only abnormal results are displayed) Labs Reviewed  COMPREHENSIVE METABOLIC PANEL - Abnormal; Notable for the following components:      Result Value   CO2 17 (*)    Glucose, Bld 228 (*)    BUN 26 (*)    Creatinine, Ser 1.88 (*)    Calcium 7.7 (*)    Total Protein 5.6 (*)    Albumin 2.7 (*)    GFR, Estimated 35 (*)    Anion gap 16 (*)    All other components within normal limits  CBC WITH DIFFERENTIAL/PLATELET - Abnormal; Notable for the following components:   RBC 2.25 (*)    Hemoglobin 5.0 (*)    HCT 19.8 (*)    MCH 22.2 (*)    MCHC 25.3 (*)    RDW 18.0 (*)    nRBC 0.8 (*)    All other components within normal limits  TROPONIN I (HIGH SENSITIVITY) - Abnormal; Notable for the following components:   Troponin I (High Sensitivity) 19 (*)    All other components within normal limits  RESP PANEL BY RT-PCR (FLU A&B, COVID) ARPGX2  VITAMIN B12  FOLATE  IRON AND TIBC  FERRITIN  RETICULOCYTES  I-STAT ARTERIAL BLOOD GAS, ED  POC OCCULT BLOOD, ED  TYPE AND SCREEN  PREPARE RBC (CROSSMATCH)    EKG None  Radiology DG Chest Port 1 View  Result Date:  03/31/2020 CLINICAL DATA:  Syncope EXAM: PORTABLE CHEST 1 VIEW COMPARISON:  05/22/2018 FINDINGS: 0733 hours. The cardio pericardial silhouette is enlarged. There is pulmonary vascular congestion without overt pulmonary edema. Patchy bibasilar airspace disease noted. No pleural effusion. The visualized bony structures of the thorax show no acute abnormality. Telemetry leads overlie the chest. IMPRESSION: 1. Enlargement of the cardio pericardial silhouette with pulmonary vascular congestion. 2. Diffuse interstitial lung disease with patchy bibasilar airspace opacity which may be related to atelectasis or pneumonia. Electronically Signed   By: Misty Stanley M.D.   On: 03/31/2020 07:36    Procedures Procedures  CRITICAL CARE Performed by: Delora Fuel Total critical care time: 50 minutes Critical care time was exclusive of separately billable procedures and treating other patients. Critical care was necessary to treat or prevent imminent or life-threatening deterioration. Critical care was time spent personally by me on the following activities: development of treatment plan with patient and/or surrogate as well as nursing, discussions with consultants, evaluation of patient's response to treatment, examination of patient, obtaining history from patient or surrogate, ordering and performing treatments and interventions, ordering and review of laboratory studies, ordering and review of radiographic studies, pulse oximetry and re-evaluation of patient's condition.  Medications Ordered in ED Medications  0.9 %  sodium chloride infusion (has no administration in time range)    ED Course  I have reviewed the triage vital signs and the nursing notes.  Pertinent labs & imaging results that were available during my care of the patient were reviewed by me and considered in my medical decision making (see chart for details).  MDM Rules/Calculators/A&P Syncope of uncertain cause.  However, with documented  hypoxia at home, that is a likely reason for his syncope.  COPD with hypoxia in spite of supplemental oxygen.  He will need to be evaluated with a chest x-ray and will also check  Covid screen.  Old records are reviewed, and he has no relevant past visits.  Case is signed out to Dr. Rogene Houston.  Chest x-ray has come back suggestive of heart failure with also findings of interstitial lung disease.  Labs show elevated creatinine which is new, severe anemia which is also new.  Rectal exam was done showing light brown stool which is Hemoccult negative.  Anemia panel has been ordered and blood transfusion has been ordered.  Final Clinical Impression(s) / ED Diagnoses Final diagnoses:  Syncope, unspecified syncope type  Hypoxia    Rx / DC Orders ED Discharge Orders    None       Delora Fuel, MD 70/62/37 [id number]    Delora Fuel, MD 15/17/61 [phone]

## 2020-03-31 NOTE — ED Notes (Signed)
Face cleansed for patient. During same, patient was taken off nonrebreather and placed on 6L O2 via [place]. He tolerated same well. SpO2 92-93%. No active bleeding noted from nares. Patient noted to also have laceration inside of top middle lip near gum line. Patient denies neck pain and tenderness.   Upon discussion of home O2, male visitor now at bedside reports that patient is supposed to be on 5L, not 3L, continuous O2 at home.

## 2020-03-31 NOTE — ED Notes (Addendum)
There was an issue with the chart, this RN and registration spoke to IT. They report they are not able to merge the charts at this time, it will have to be done the next busy day. IT requested I copy my notes into this chart. Entire triage was completed on first admission that was canceled, re-entering on this chart.

## 2020-03-31 NOTE — ED Notes (Signed)
Attempted to call report

## 2020-03-31 NOTE — ED Notes (Signed)
SpO2 remained low. Nasal cannula placed in mouth r/t mouth breathing. SpO2 98-100% on same.

## 2020-03-31 NOTE — ED Triage Notes (Addendum)
Patient presents from home. Reports he was about to walk back upstairs after going to the kitchen to get food when, felt dizzy then woke up on the floor. Denies falling while on the stairs. +LOC. Reports he was feeling shortness of breath prior same, but reports that he typically does. He reports he did not feel worse than normal. Hx of COPD. On 3 - 3.5L continuous O2 at home. Dried blood noted from Rt nare with no other gross deformities noted. Per EMS SpO2 70s in RA and on 3L [place] so patient was placed on nonrebreather. Per EMS, they attempted to take patient off nonrebreather prior to arrival, and patient did not tolerate. Patient able to speak and answer questions with minimal difficulty during triage. Respirations are even and mildly labored with accessory muscle use. RR of 20.

## 2020-03-31 NOTE — ED Triage Notes (Signed)
Patient presents from home. Reports he was eating, felt dizzy then woke up on the floor. +LOC. Reports he was feeling shortness of breath prior same, but reports that he typically does. He reports he did not feel worse than normal. Hx of COPD. On 3 - 3.5L continuous O2 at home. Dried blood noted from Rt nare with no other gross deformities noted. Per EMS SpO2 70s in RA and on 3L [place] so patient was placed on nonrebreather. Per EMS, they attempted to take patient off nonrebreather prior to arrival, and patient did not tolerate. Patient able to speak and answer questions with minimal difficulty during triage. Respirations are even and mildly labored with accessory muscle use. RR of 20.

## 2020-04-01 ENCOUNTER — Inpatient Hospital Stay (HOSPITAL_COMMUNITY): Payer: Medicare HMO

## 2020-04-01 ENCOUNTER — Other Ambulatory Visit: Payer: Self-pay

## 2020-04-01 DIAGNOSIS — D649 Anemia, unspecified: Secondary | ICD-10-CM | POA: Diagnosis not present

## 2020-04-01 DIAGNOSIS — I361 Nonrheumatic tricuspid (valve) insufficiency: Secondary | ICD-10-CM

## 2020-04-01 DIAGNOSIS — I350 Nonrheumatic aortic (valve) stenosis: Secondary | ICD-10-CM

## 2020-04-01 DIAGNOSIS — R0602 Shortness of breath: Secondary | ICD-10-CM

## 2020-04-01 DIAGNOSIS — I340 Nonrheumatic mitral (valve) insufficiency: Secondary | ICD-10-CM

## 2020-04-01 DIAGNOSIS — R55 Syncope and collapse: Secondary | ICD-10-CM

## 2020-04-01 LAB — CBC
HCT: 26.1 % — ABNORMAL LOW (ref 39.0–52.0)
Hemoglobin: 8 g/dL — ABNORMAL LOW (ref 13.0–17.0)
MCH: 25.2 pg — ABNORMAL LOW (ref 26.0–34.0)
MCHC: 30.7 g/dL (ref 30.0–36.0)
MCV: 82.1 fL (ref 80.0–100.0)
Platelets: 218 10*3/uL (ref 150–400)
RBC: 3.18 MIL/uL — ABNORMAL LOW (ref 4.22–5.81)
RDW: 17.5 % — ABNORMAL HIGH (ref 11.5–15.5)
WBC: 8.3 10*3/uL (ref 4.0–10.5)
nRBC: 0.4 % — ABNORMAL HIGH (ref 0.0–0.2)

## 2020-04-01 LAB — CBC WITH DIFFERENTIAL/PLATELET
Abs Immature Granulocytes: 0.02 10*3/uL (ref 0.00–0.07)
Basophils Absolute: 0 10*3/uL (ref 0.0–0.1)
Basophils Relative: 0 %
Eosinophils Absolute: 0.1 10*3/uL (ref 0.0–0.5)
Eosinophils Relative: 2 %
HCT: 26 % — ABNORMAL LOW (ref 39.0–52.0)
Hemoglobin: 7.6 g/dL — ABNORMAL LOW (ref 13.0–17.0)
Immature Granulocytes: 0 %
Lymphocytes Relative: 9 %
Lymphs Abs: 0.6 10*3/uL — ABNORMAL LOW (ref 0.7–4.0)
MCH: 24.4 pg — ABNORMAL LOW (ref 26.0–34.0)
MCHC: 29.2 g/dL — ABNORMAL LOW (ref 30.0–36.0)
MCV: 83.3 fL (ref 80.0–100.0)
Monocytes Absolute: 0.9 10*3/uL (ref 0.1–1.0)
Monocytes Relative: 12 %
Neutro Abs: 5.9 10*3/uL (ref 1.7–7.7)
Neutrophils Relative %: 77 %
Platelets: 195 10*3/uL (ref 150–400)
RBC: 3.12 MIL/uL — ABNORMAL LOW (ref 4.22–5.81)
RDW: 17.6 % — ABNORMAL HIGH (ref 11.5–15.5)
WBC: 7.5 10*3/uL (ref 4.0–10.5)
nRBC: 0.4 % — ABNORMAL HIGH (ref 0.0–0.2)

## 2020-04-01 LAB — ECHOCARDIOGRAM COMPLETE
AR max vel: 1.15 cm2
AV Area VTI: 1.2 cm2
AV Area mean vel: 1.2 cm2
AV Mean grad: 9 mmHg
AV Peak grad: 17.5 mmHg
Ao pk vel: 2.09 m/s
Area-P 1/2: 5.42 cm2
Height: 68 in
S' Lateral: 3.1 cm
Single Plane A4C EF: 59.7 %
Weight: 2899.49 oz

## 2020-04-01 LAB — BPAM RBC
Blood Product Expiration Date: 202201232359
Blood Product Expiration Date: 202201242359
ISSUE DATE / TIME: 202112250933
ISSUE DATE / TIME: 202112251435
Unit Type and Rh: 5100
Unit Type and Rh: 5100

## 2020-04-01 LAB — TYPE AND SCREEN
ABO/RH(D): O POS
Antibody Screen: NEGATIVE
Unit division: 0
Unit division: 0

## 2020-04-01 LAB — URINALYSIS, ROUTINE W REFLEX MICROSCOPIC
Bacteria, UA: NONE SEEN
Bilirubin Urine: NEGATIVE
Glucose, UA: NEGATIVE mg/dL
Ketones, ur: NEGATIVE mg/dL
Leukocytes,Ua: NEGATIVE
Nitrite: NEGATIVE
Protein, ur: NEGATIVE mg/dL
Specific Gravity, Urine: 1.008 (ref 1.005–1.030)
pH: 5 (ref 5.0–8.0)

## 2020-04-01 LAB — BASIC METABOLIC PANEL
Anion gap: 7 (ref 5–15)
BUN: 31 mg/dL — ABNORMAL HIGH (ref 8–23)
CO2: 26 mmol/L (ref 22–32)
Calcium: 8.7 mg/dL — ABNORMAL LOW (ref 8.9–10.3)
Chloride: 106 mmol/L (ref 98–111)
Creatinine, Ser: 1.66 mg/dL — ABNORMAL HIGH (ref 0.61–1.24)
GFR, Estimated: 41 mL/min — ABNORMAL LOW (ref >60)
Glucose, Bld: 96 mg/dL (ref 70–99)
Potassium: 4.2 mmol/L (ref 3.5–5.1)
Sodium: 139 mmol/L (ref 135–145)

## 2020-04-01 LAB — GLUCOSE, CAPILLARY
Glucose-Capillary: 95 mg/dL (ref 70–99)
Glucose-Capillary: 137 mg/dL — ABNORMAL HIGH (ref 70–99)
Glucose-Capillary: 148 mg/dL — ABNORMAL HIGH (ref 70–99)
Glucose-Capillary: 108 mg/dL — ABNORMAL HIGH (ref 70–99)

## 2020-04-01 MED ORDER — ORAL CARE MOUTH RINSE
15.0000 mL | Freq: Two times a day (BID) | OROMUCOSAL | Status: DC
  Administered 2020-04-02 – 2020-04-13 (×23): 15 mL via OROMUCOSAL

## 2020-04-01 MED ORDER — TECHNETIUM TO 99M ALBUMIN AGGREGATED
4.1000 | Freq: Once | INTRAVENOUS | Status: AC | PRN
  Administered 2020-04-01: 4.1 via INTRAVENOUS

## 2020-04-01 MED ORDER — HYDRALAZINE HCL 20 MG/ML IJ SOLN
10.0000 mg | Freq: Four times a day (QID) | INTRAMUSCULAR | Status: DC | PRN
  Administered 2020-04-03: 10 mg via INTRAVENOUS
  Filled 2020-04-01 (×2): qty 1

## 2020-04-01 MED ORDER — SALINE SPRAY 0.65 % NA SOLN
1.0000 | NASAL | Status: DC | PRN
  Administered 2020-04-01 – 2020-04-02 (×2): 1 via NASAL
  Filled 2020-04-01: qty 44

## 2020-04-01 NOTE — Progress Notes (Signed)
Patient desats frequently with any movement, oxygen saturations as low as the 75% on HFNC 12L.  Returns to 88-92% with slow breaths through nose.  Denies SOB. Encouraged patient to take rest breaks with all activity including eating.

## 2020-04-01 NOTE — Progress Notes (Signed)
  Echocardiogram 2D Echocardiogram has been performed.  Bradley Johns F 04/01/2020, 2:23 PM

## 2020-04-01 NOTE — Progress Notes (Signed)
Lower extremity venous bilateral study completed.   Please see CV Proc for preliminary results.   Avontae Burkhead, RDMS  

## 2020-04-01 NOTE — Progress Notes (Signed)
PT Cancellation Note  Patient Details Name: JERNARD REIBER MRN: [id number] DOB: 22-Jun-1934   Cancelled Treatment:    Reason Eval/Treat Not Completed: Patient at procedure or test/unavailable   Will follow,   Roney Marion, PT  Acute Rehabilitation Services Pager [phone] Office [phone]    [place] 04/01/2020, 4:46 PM

## 2020-04-01 NOTE — Progress Notes (Signed)
PROGRESS NOTE    Bradley Johns  [id number] DOB: [birth year] DOA: 03/31/2020 PCP: Colon Branch, MD   Brief Narrative:  HPI: Bradley Johns is a 85 y.o. 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  He was sleeping for most of the evening but came downstairs for a snack about 0330.  He was heading back upstairs after a couple of hours and was pushing his oxygen tank when suddenly he was in the floor.  "They say that my oxygen level was terrible."   He does not remember feeling bad prior to the fall.  He pushes his oxygen tank when he goes up/down stairs.  He does remember feeling light-headed, dizzy, SOB prior to fall.  When he is doing strenuous exercise he gets SOB; this has been an issue for a while.  He is on 3-5L home O2, has been getting more SOB progressively.  He has intermittent BRB in his urine with clotting periodically, present for a while.  He doesn't leave his property at all for the last 2 years other than to doctor appointments.  When he gets up to walk within the house, he is very tremulous.  Sometimes he misjudges distances and almost falls - that may have happened today.  He does report some memory issues.    ED Course:  Syncope while walking with O2 tank.  Hgb 5, has had exertional SOB.  Heme negative.  Usually on 3.5L home O2, has needed as much as 6, currently on Venturi mask.  CXR with volume overload vs. PNA.  Troponin 19, no chest pain.  EKG unremarkable.  Has nasal fractures, ENT consult pending.  Assessment & Plan:   Principal Problem:   Symptomatic anemia Active Problems:   Diabetes mellitus type 2 in nonobese (HCC)   Hyperlipidemia   Essential hypertension   COPD with emphysema, on O2   BPH (benign prostatic hyperplasia)   Pulmonary fibrosis (HCC)   Chronic respiratory failure with hypoxia (HCC)   Syncope and collapse   AKI (acute kidney injury) ([place])  Symptomatic  anemia: Upon chart review, it appears that patient was hospitalized from 05/20/2018 through 05/25/2018 and he was diagnosed to have diverticular.  Which is stopped by itself and did not require any colonoscopy.  His hemoglobin was 5 upon admission this time.  Patient denies seeing any dark stools or bright red blood in stools.  He does endorse having some gross hematuria however currently, his urine is completely clear.  FOBT x1 is negative as well.  Iron studies also indicate iron deficiency anemia which is consistent with possible slow and chronic bleed.  I suspect that he is probably having intermittent diverticular bleed.  Will check FOBT once again.  He already received 2 units of PRBC transfusion.  His hemoglobin is over 8.  We will monitor every 12 hours and transfuse if he drops less than 7.  If clear evidence of diverticular bleed, will consult GI.    Syncope: This could very well be due to orthostatic hypotension as he also has AKI and it could be also due to symptomatic anemia.  Continue to monitor on telemetry.  So far no events.  Echo is pending.  PT OT consulted.  Acute on Chronic respiratory failure with hypoxia due to COPD, pulmonary fibrosis: -Patient is on chronic [place] O2, generally 3-4.5L  -He has advanced COPD and pulmonary fibrosis Chest x-ray shows pulmonary vascular congestion and  possible patchy bibasilar airspace opacity which may be atelectasis or pneumonia.  Patient does not have any symptoms suggestive of pneumonia with normal leukocytosis, no fever.  Based on his presentation which is combination of acute on chronic hypoxic respiratory failure with syncope and immobility as he has not gotten out of the house much since last 2 years , this raises suspicion for possible PE.  Unfortunately due to elevated creatinine, he is not a candidate for CTA.  And due to pulmonary fibrosis, VQ scan might not be ideal study as well however we do not have any other choice so we will proceed with VQ  scan as well as Doppler lower extremity DVT.  I hope he does not have any thromboembolism as that would put [place] in a difficult position since he is not going to be a candidate for anticoagulation due to active bleeding.  AKI -Last creatinine was normal almost 2 years ago Presented with creatinine of 1.8 which is improved to 1.6.  We will continue gentle hydration as he is receiving already  DM type II: Hemoglobin A1c is 5.7 only.  Continue to hold Glucophage and Actos.  Continue SSI.  HTN: Only slightly elevated -Continue Norvasc, Coreg and Cardura.  Add as needed hydralazine. -Continue to hold Cozaar in the setting of AKI  HLD -Continue Lipitor  BPH -Continue Cardura -He has chronic urinary incontinence and wears Depends  Chronic pain: Continue home medication.  DVT prophylaxis: enoxaparin (LOVENOX) injection 40 mg Start: 03/31/20 1200   Code Status: Full Code  Family Communication: None present at bedside.  Plan of care discussed with patient in length and he verbalized understanding and agreed with it.  Status is: Inpatient  Remains inpatient appropriate because:Inpatient level of care appropriate due to severity of illness   Dispo: The patient is from: Home              Anticipated d/c is to: Home              Anticipated d/c date is: 2 days              Patient currently is not medically stable to d/c.        Estimated body mass index is 27.55 kg/m as calculated from the following:   Height as of this encounter: 5\' 8"  (1.727 m).   Weight as of this encounter: 82.2 kg.      Nutritional status:               Consultants:   None  Procedures:   None  Antimicrobials:  Anti-infectives (From admission, onward)   None         Subjective: Patient seen and examined.  He complains of very mild shortness of breath and no other specific complaint.  He was admitted as DNR but now he wants to change to full code and discussed with his wife  further.  Objective: Vitals:   03/31/20 2125 04/01/20 0350 04/01/20 0445 04/01/20 0937  BP: (!) 169/66 (!) 168/75  (!) 156/62  Pulse: 78 68  74  Resp: (!) 24 16  (!) 21  Temp:  98.9 F (37.2 C)  98.1 F (36.7 C)  TempSrc:  Oral  Oral  SpO2: 98% 98%  95%  Weight:   82.2 kg   Height:        Intake/Output Summary (Last 24 hours) at 04/01/2020 1058 Last data filed at 04/01/2020 0900 Gross per 24 hour  Intake 2620.84 ml  Output --  Net 2620.84 ml   Filed Weights   03/31/20 0623 04/01/20 0445  Weight: 86.2 kg 82.2 kg    Examination:  General exam: Appears calm and comfortable but chronically sick Respiratory system: Rales bilaterally with slightly diminished breath sounds at the bases. Respiratory effort normal. Cardiovascular system: S1 & S2 heard, RRR. No JVD, murmurs, rubs, gallops or clicks. No pedal edema. Gastrointestinal system: Abdomen is nondistended, soft and nontender. No organomegaly or masses felt. Normal bowel sounds heard. Central nervous system: Alert and oriented. No focal neurological deficits. Extremities: Symmetric 5 x 5 power. Skin: No rashes, lesions or ulcers Psychiatry: Judgement and insight appear normal. Mood & affect appropriate.    Data Reviewed: I have personally reviewed following labs and imaging studies  CBC: Recent Labs  Lab 03/31/20 0708 03/31/20 0759 03/31/20 1750 04/01/20 0509  WBC 6.1  --  7.0 8.3  NEUTROABS 4.8  --   --   --   HGB 5.0* 5.8* 6.9* 8.0*  HCT 19.8* 17.0* 24.2* 26.1*  MCV 88.0  --  83.7 82.1  PLT 213  --  197 [id number]   Basic Metabolic Panel: Recent Labs  Lab 03/31/20 0708 03/31/20 0759 04/01/20 0509  NA 139 139 139  K 4.5 4.3 4.2  CL 106  --  106  CO2 17*  --  26  GLUCOSE 228*  --  96  BUN 26*  --  31*  CREATININE 1.88*  --  1.66*  CALCIUM 7.7*  --  8.7*   GFR: Estimated Creatinine Clearance: 35.2 mL/min (A) (by C-G formula based on SCr of 1.66 mg/dL (H)). Liver Function Tests: Recent Labs  Lab  03/31/20 0708  AST 30  ALT 18  ALKPHOS 47  BILITOT 0.8  PROT 5.6*  ALBUMIN 2.7*   No results for input(s): LIPASE, AMYLASE in the last 168 hours. No results for input(s): AMMONIA in the last 168 hours. Coagulation Profile: No results for input(s): INR, PROTIME in the last 168 hours. Cardiac Enzymes: No results for input(s): CKTOTAL, CKMB, CKMBINDEX, TROPONINI in the last 168 hours. BNP (last 3 results) No results for input(s): PROBNP in the last 8760 hours. HbA1C: Recent Labs    03/31/20 1112  HGBA1C 5.7*   CBG: Recent Labs  Lab 03/31/20 1231 03/31/20 1642 03/31/20 2028 04/01/20 0744  GLUCAP 108* 111* 109* 95   Lipid Profile: No results for input(s): CHOL, HDL, LDLCALC, TRIG, CHOLHDL, LDLDIRECT in the last 72 hours. Thyroid Function Tests: Recent Labs    03/31/20 1112  TSH 2.999   Anemia Panel: Recent Labs    03/31/20 0757 03/31/20 1112  VITAMINB12 276 257  FOLATE 26.6 19.3  FERRITIN 11* 11*  TIBC 410 405  IRON 20* 20*  RETICCTPCT 2.3 1.8   Sepsis Labs: No results for input(s): PROCALCITON, LATICACIDVEN in the last 168 hours.  Recent Results (from the past 240 hour(s))  Resp Panel by RT-PCR (Flu A&B, Covid) Nasopharyngeal Swab     Status: None   Collection Time: 03/31/20  7:05 AM   Specimen: Nasopharyngeal Swab; Nasopharyngeal(NP) swabs in vial transport medium  Result Value Ref Range Status   SARS Coronavirus 2 by RT PCR NEGATIVE NEGATIVE Final    Comment: (NOTE) SARS-CoV-2 target nucleic acids are NOT DETECTED.  The SARS-CoV-2 RNA is generally detectable in upper respiratory specimens during the acute phase of infection. The lowest concentration of SARS-CoV-2 viral copies this assay can detect is 138 copies/mL. A negative result does not preclude SARS-Cov-2 infection and should not be  used as the sole basis for treatment or other patient management decisions. A negative result may occur with  improper specimen collection/handling, submission of  specimen other than nasopharyngeal swab, presence of viral mutation(s) within the areas targeted by this assay, and inadequate number of viral copies(<138 copies/mL). A negative result must be combined with clinical observations, patient history, and epidemiological information. The expected result is Negative.  Fact Sheet for Patients:  EntrepreneurPulse.com.au  Fact Sheet for Healthcare Providers:  IncredibleEmployment.be  This test is no t yet approved or cleared by the [place] FDA and  has been authorized for detection and/or diagnosis of SARS-CoV-2 by FDA under an Emergency Use Authorization (EUA). This EUA will remain  in effect (meaning this test can be used) for the duration of the COVID-19 declaration under Section 564(b)(1) of the Act, 21 U.S.C.section 360bbb-3(b)(1), unless the authorization is terminated  or revoked sooner.       Influenza A by PCR NEGATIVE NEGATIVE Final   Influenza B by PCR NEGATIVE NEGATIVE Final    Comment: (NOTE) The Xpert Xpress SARS-CoV-2/FLU/RSV plus assay is intended as an aid in the diagnosis of influenza from Nasopharyngeal swab specimens and should not be used as a sole basis for treatment. Nasal washings and aspirates are unacceptable for Xpert Xpress SARS-CoV-2/FLU/RSV testing.  Fact Sheet for Patients: EntrepreneurPulse.com.au  Fact Sheet for Healthcare Providers: IncredibleEmployment.be  This test is not yet approved or cleared by the [place] FDA and has been authorized for detection and/or diagnosis of SARS-CoV-2 by FDA under an Emergency Use Authorization (EUA). This EUA will remain in effect (meaning this test can be used) for the duration of the COVID-19 declaration under Section 564(b)(1) of the Act, 21 U.S.C. section 360bbb-3(b)(1), unless the authorization is terminated or revoked.  Performed at [facility], [place] [address]  [place], [place] [address]       Radiology Studies: CT Head Wo Contrast  Result Date: 03/31/2020 CLINICAL DATA:  Unwitnessed fall. EXAM: CT HEAD WITHOUT CONTRAST CT MAXILLOFACIAL WITHOUT CONTRAST CT CERVICAL SPINE WITHOUT CONTRAST TECHNIQUE: Multidetector CT imaging of the head, cervical spine, and maxillofacial structures were performed using the standard protocol without intravenous contrast. Multiplanar CT image reconstructions of the cervical spine and maxillofacial structures were also generated. COMPARISON:  None. FINDINGS: CT HEAD FINDINGS Brain: There is no evidence for acute hemorrhage, hydrocephalus, mass lesion, or abnormal extra-axial fluid collection. No definite CT evidence for acute infarction. Diffuse loss of parenchymal volume is consistent with atrophy. Patchy low attenuation in the deep hemispheric and periventricular white matter is nonspecific, but likely reflects chronic microvascular ischemic demyelination. Vascular: No hyperdense vessel or unexpected calcification. Skull: No evidence for fracture. No worrisome lytic or sclerotic lesion. Other: None. CT MAXILLOFACIAL FINDINGS Osseous: Age indeterminate bilateral nasal bone fractures evident. No other acute facial bone fracture. Mandible is intact. Temporomandibular joints are located with some mild degenerative changes in the left TMJ. Orbits: Negative. No traumatic or inflammatory finding. Sinuses: Air-fluid level noted right maxillary sinus. Remaining paranasal sinuses and mastoid air cells are clear. Soft tissues: Negative. CT CERVICAL SPINE FINDINGS Alignment: Reversal of normal cervical lordosis without subluxation. Skull base and vertebrae: No acute fracture. No primary bone lesion or focal pathologic process. Soft tissues and spinal canal: No prevertebral fluid or swelling. No visible canal hematoma. Disc levels: Loss of disc height noted diffusely in the cervical spine from C4-5 down to C6-7 with multilevel associated endplate  spurring. Upper chest: Aberrant origin right subclavian artery noted. Emphysema  noted in the lung apices with 10 mm irregular right apical nodule visible on image 92 of series 4. Other: None. IMPRESSION: 1. No acute intracranial abnormality. Atrophy with chronic small vessel white matter ischemic disease. 2. Age indeterminate bilateral nasal bone fractures. No other acute facial bone fracture. 3. Air-fluid level in the right maxillary sinus. This could be related to hemorrhage in the setting of acute trauma or acute sinusitis. 4. Degenerative changes in the cervical spine without fracture. 5. 10 mm irregular right apical pulmonary nodule. Follow-up CT chest without contrast in 3 months recommended to ensure stability as this may be related to scarring. PET-CT could also be used to further evaluate as clinically warranted. 6. Aberrant origin right subclavian artery. 7. Reversal of normal cervical lordosis. This can be related to patient positioning, muscle spasm or soft tissue injury. 8. Emphysema (ICD10-J43.9). Electronically Signed   By: Misty Stanley M.D.   On: 03/31/2020 08:05   CT Cervical Spine Wo Contrast  Result Date: 03/31/2020 CLINICAL DATA:  Unwitnessed fall. EXAM: CT HEAD WITHOUT CONTRAST CT MAXILLOFACIAL WITHOUT CONTRAST CT CERVICAL SPINE WITHOUT CONTRAST TECHNIQUE: Multidetector CT imaging of the head, cervical spine, and maxillofacial structures were performed using the standard protocol without intravenous contrast. Multiplanar CT image reconstructions of the cervical spine and maxillofacial structures were also generated. COMPARISON:  None. FINDINGS: CT HEAD FINDINGS Brain: There is no evidence for acute hemorrhage, hydrocephalus, mass lesion, or abnormal extra-axial fluid collection. No definite CT evidence for acute infarction. Diffuse loss of parenchymal volume is consistent with atrophy. Patchy low attenuation in the deep hemispheric and periventricular white matter is nonspecific, but  likely reflects chronic microvascular ischemic demyelination. Vascular: No hyperdense vessel or unexpected calcification. Skull: No evidence for fracture. No worrisome lytic or sclerotic lesion. Other: None. CT MAXILLOFACIAL FINDINGS Osseous: Age indeterminate bilateral nasal bone fractures evident. No other acute facial bone fracture. Mandible is intact. Temporomandibular joints are located with some mild degenerative changes in the left TMJ. Orbits: Negative. No traumatic or inflammatory finding. Sinuses: Air-fluid level noted right maxillary sinus. Remaining paranasal sinuses and mastoid air cells are clear. Soft tissues: Negative. CT CERVICAL SPINE FINDINGS Alignment: Reversal of normal cervical lordosis without subluxation. Skull base and vertebrae: No acute fracture. No primary bone lesion or focal pathologic process. Soft tissues and spinal canal: No prevertebral fluid or swelling. No visible canal hematoma. Disc levels: Loss of disc height noted diffusely in the cervical spine from C4-5 down to C6-7 with multilevel associated endplate spurring. Upper chest: Aberrant origin right subclavian artery noted. Emphysema noted in the lung apices with 10 mm irregular right apical nodule visible on image 92 of series 4. Other: None. IMPRESSION: 1. No acute intracranial abnormality. Atrophy with chronic small vessel white matter ischemic disease. 2. Age indeterminate bilateral nasal bone fractures. No other acute facial bone fracture. 3. Air-fluid level in the right maxillary sinus. This could be related to hemorrhage in the setting of acute trauma or acute sinusitis. 4. Degenerative changes in the cervical spine without fracture. 5. 10 mm irregular right apical pulmonary nodule. Follow-up CT chest without contrast in 3 months recommended to ensure stability as this may be related to scarring. PET-CT could also be used to further evaluate as clinically warranted. 6. Aberrant origin right subclavian artery. 7. Reversal  of normal cervical lordosis. This can be related to patient positioning, muscle spasm or soft tissue injury. 8. Emphysema (ICD10-J43.9). Electronically Signed   By: Misty Stanley M.D.   On: 03/31/2020 08:05  DG Chest Port 1 View  Result Date: 03/31/2020 CLINICAL DATA:  Syncope EXAM: PORTABLE CHEST 1 VIEW COMPARISON:  05/22/2018 FINDINGS: 0733 hours. The cardio pericardial silhouette is enlarged. There is pulmonary vascular congestion without overt pulmonary edema. Patchy bibasilar airspace disease noted. No pleural effusion. The visualized bony structures of the thorax show no acute abnormality. Telemetry leads overlie the chest. IMPRESSION: 1. Enlargement of the cardio pericardial silhouette with pulmonary vascular congestion. 2. Diffuse interstitial lung disease with patchy bibasilar airspace opacity which may be related to atelectasis or pneumonia. Electronically Signed   By: Misty Stanley M.D.   On: 03/31/2020 07:36   CT Maxillofacial Wo Contrast  Result Date: 03/31/2020 CLINICAL DATA:  Unwitnessed fall. EXAM: CT HEAD WITHOUT CONTRAST CT MAXILLOFACIAL WITHOUT CONTRAST CT CERVICAL SPINE WITHOUT CONTRAST TECHNIQUE: Multidetector CT imaging of the head, cervical spine, and maxillofacial structures were performed using the standard protocol without intravenous contrast. Multiplanar CT image reconstructions of the cervical spine and maxillofacial structures were also generated. COMPARISON:  None. FINDINGS: CT HEAD FINDINGS Brain: There is no evidence for acute hemorrhage, hydrocephalus, mass lesion, or abnormal extra-axial fluid collection. No definite CT evidence for acute infarction. Diffuse loss of parenchymal volume is consistent with atrophy. Patchy low attenuation in the deep hemispheric and periventricular white matter is nonspecific, but likely reflects chronic microvascular ischemic demyelination. Vascular: No hyperdense vessel or unexpected calcification. Skull: No evidence for fracture. No  worrisome lytic or sclerotic lesion. Other: None. CT MAXILLOFACIAL FINDINGS Osseous: Age indeterminate bilateral nasal bone fractures evident. No other acute facial bone fracture. Mandible is intact. Temporomandibular joints are located with some mild degenerative changes in the left TMJ. Orbits: Negative. No traumatic or inflammatory finding. Sinuses: Air-fluid level noted right maxillary sinus. Remaining paranasal sinuses and mastoid air cells are clear. Soft tissues: Negative. CT CERVICAL SPINE FINDINGS Alignment: Reversal of normal cervical lordosis without subluxation. Skull base and vertebrae: No acute fracture. No primary bone lesion or focal pathologic process. Soft tissues and spinal canal: No prevertebral fluid or swelling. No visible canal hematoma. Disc levels: Loss of disc height noted diffusely in the cervical spine from C4-5 down to C6-7 with multilevel associated endplate spurring. Upper chest: Aberrant origin right subclavian artery noted. Emphysema noted in the lung apices with 10 mm irregular right apical nodule visible on image 92 of series 4. Other: None. IMPRESSION: 1. No acute intracranial abnormality. Atrophy with chronic small vessel white matter ischemic disease. 2. Age indeterminate bilateral nasal bone fractures. No other acute facial bone fracture. 3. Air-fluid level in the right maxillary sinus. This could be related to hemorrhage in the setting of acute trauma or acute sinusitis. 4. Degenerative changes in the cervical spine without fracture. 5. 10 mm irregular right apical pulmonary nodule. Follow-up CT chest without contrast in 3 months recommended to ensure stability as this may be related to scarring. PET-CT could also be used to further evaluate as clinically warranted. 6. Aberrant origin right subclavian artery. 7. Reversal of normal cervical lordosis. This can be related to patient positioning, muscle spasm or soft tissue injury. 8. Emphysema (ICD10-J43.9). Electronically Signed    By: Misty Stanley M.D.   On: 03/31/2020 08:05    Scheduled Meds: . amLODipine  10 mg Oral Daily  . aspirin EC  81 mg Oral Daily  . atorvastatin  80 mg Oral QPM  . carvedilol  12.5 mg Oral BID WC  . doxazosin  4 mg Oral QHS  . enoxaparin (LOVENOX) injection  40 mg Subcutaneous Q24H  .  insulin aspart  0-15 Units Subcutaneous TID WC  . insulin aspart  0-5 Units Subcutaneous QHS  . sodium chloride flush  3 mL Intravenous Q12H   Continuous Infusions: . sodium chloride    . lactated ringers 75 mL/hr at 04/01/20 1057     LOS: 1 day   Time spent: 37 minutes   Darliss Cheney, MD [facility]  04/01/2020, 10:58 AM   To contact the attending provider between 7A-7P or the covering provider during after hours 7P-7A, please log into the web site [email or web address].

## 2020-04-02 ENCOUNTER — Inpatient Hospital Stay (HOSPITAL_COMMUNITY): Payer: Medicare HMO

## 2020-04-02 DIAGNOSIS — D649 Anemia, unspecified: Secondary | ICD-10-CM | POA: Diagnosis not present

## 2020-04-02 LAB — CBC WITH DIFFERENTIAL/PLATELET
Abs Immature Granulocytes: 0.03 10*3/uL (ref 0.00–0.07)
Abs Immature Granulocytes: 0.03 10*3/uL (ref 0.00–0.07)
Basophils Absolute: 0 10*3/uL (ref 0.0–0.1)
Basophils Absolute: 0 10*3/uL (ref 0.0–0.1)
Basophils Relative: 0 %
Basophils Relative: 0 %
Eosinophils Absolute: 0.2 10*3/uL (ref 0.0–0.5)
Eosinophils Absolute: 0 10*3/uL (ref 0.0–0.5)
Eosinophils Relative: 2 %
Eosinophils Relative: 1 %
HCT: 24.2 % — ABNORMAL LOW (ref 39.0–52.0)
HCT: 24.4 % — ABNORMAL LOW (ref 39.0–52.0)
Hemoglobin: 7.1 g/dL — ABNORMAL LOW (ref 13.0–17.0)
Hemoglobin: 7.4 g/dL — ABNORMAL LOW (ref 13.0–17.0)
Immature Granulocytes: 0 %
Immature Granulocytes: 1 %
Lymphocytes Relative: 11 %
Lymphocytes Relative: 6 %
Lymphs Abs: 0.9 10*3/uL (ref 0.7–4.0)
Lymphs Abs: 0.4 10*3/uL — ABNORMAL LOW (ref 0.7–4.0)
MCH: 24.4 pg — ABNORMAL LOW (ref 26.0–34.0)
MCH: 25 pg — ABNORMAL LOW (ref 26.0–34.0)
MCHC: 29.3 g/dL — ABNORMAL LOW (ref 30.0–36.0)
MCHC: 30.3 g/dL (ref 30.0–36.0)
MCV: 83.2 fL (ref 80.0–100.0)
MCV: 82.4 fL (ref 80.0–100.0)
Monocytes Absolute: 1.1 10*3/uL — ABNORMAL HIGH (ref 0.1–1.0)
Monocytes Absolute: 0.3 10*3/uL (ref 0.1–1.0)
Monocytes Relative: 13 %
Monocytes Relative: 5 %
Neutro Abs: 5.9 10*3/uL (ref 1.7–7.7)
Neutro Abs: 5.5 10*3/uL (ref 1.7–7.7)
Neutrophils Relative %: 74 %
Neutrophils Relative %: 87 %
Platelets: 186 10*3/uL (ref 150–400)
Platelets: 200 10*3/uL (ref 150–400)
RBC: 2.91 MIL/uL — ABNORMAL LOW (ref 4.22–5.81)
RBC: 2.96 MIL/uL — ABNORMAL LOW (ref 4.22–5.81)
RDW: 17.3 % — ABNORMAL HIGH (ref 11.5–15.5)
RDW: 17.3 % — ABNORMAL HIGH (ref 11.5–15.5)
WBC: 8.1 10*3/uL (ref 4.0–10.5)
WBC: 6.3 10*3/uL (ref 4.0–10.5)
nRBC: 0.5 % — ABNORMAL HIGH (ref 0.0–0.2)
nRBC: 0.5 % — ABNORMAL HIGH (ref 0.0–0.2)

## 2020-04-02 LAB — COMPREHENSIVE METABOLIC PANEL
ALT: 21 U/L (ref 0–44)
AST: 27 U/L (ref 15–41)
Albumin: 2.8 g/dL — ABNORMAL LOW (ref 3.5–5.0)
Alkaline Phosphatase: 64 U/L (ref 38–126)
Anion gap: 8 (ref 5–15)
BUN: 25 mg/dL — ABNORMAL HIGH (ref 8–23)
CO2: 26 mmol/L (ref 22–32)
Calcium: 8.6 mg/dL — ABNORMAL LOW (ref 8.9–10.3)
Chloride: 104 mmol/L (ref 98–111)
Creatinine, Ser: 1.78 mg/dL — ABNORMAL HIGH (ref 0.61–1.24)
GFR, Estimated: 37 mL/min — ABNORMAL LOW (ref >60)
Glucose, Bld: 122 mg/dL — ABNORMAL HIGH (ref 70–99)
Potassium: 4.3 mmol/L (ref 3.5–5.1)
Sodium: 138 mmol/L (ref 135–145)
Total Bilirubin: 0.9 mg/dL (ref 0.3–1.2)
Total Protein: 6.2 g/dL — ABNORMAL LOW (ref 6.5–8.1)

## 2020-04-02 LAB — GLUCOSE, CAPILLARY
Glucose-Capillary: 110 mg/dL — ABNORMAL HIGH (ref 70–99)
Glucose-Capillary: 198 mg/dL — ABNORMAL HIGH (ref 70–99)
Glucose-Capillary: 175 mg/dL — ABNORMAL HIGH (ref 70–99)
Glucose-Capillary: 168 mg/dL — ABNORMAL HIGH (ref 70–99)

## 2020-04-02 LAB — PROCALCITONIN: Procalcitonin: <0.1 ng/mL

## 2020-04-02 LAB — OCCULT BLOOD X 1 CARD TO LAB, STOOL: Fecal Occult Bld: NEGATIVE

## 2020-04-02 LAB — MAGNESIUM: Magnesium: 1.6 mg/dL — ABNORMAL LOW (ref 1.7–2.4)

## 2020-04-02 MED ORDER — MAGNESIUM SULFATE 2 GM/50ML IV SOLN
2.0000 g | Freq: Once | INTRAVENOUS | Status: AC
  Administered 2020-04-02: 2 g via INTRAVENOUS
  Filled 2020-04-02: qty 50

## 2020-04-02 MED ORDER — FUROSEMIDE 10 MG/ML IJ SOLN
60.0000 mg | Freq: Once | INTRAMUSCULAR | Status: AC
  Administered 2020-04-02: 60 mg via INTRAVENOUS
  Filled 2020-04-02: qty 6

## 2020-04-02 MED ORDER — METHYLPREDNISOLONE SODIUM SUCC 125 MG IJ SOLR
60.0000 mg | Freq: Four times a day (QID) | INTRAMUSCULAR | Status: AC
  Administered 2020-04-02 (×2): 60 mg via INTRAVENOUS
  Filled 2020-04-02 (×2): qty 2

## 2020-04-02 MED ORDER — MENTHOL 3 MG MT LOZG: 1.0000 | LOZENGE | OROMUCOSAL | Status: DC | PRN

## 2020-04-02 MED ORDER — MENTHOL 3 MG MT LOZG
1.0000 | LOZENGE | OROMUCOSAL | Status: DC | PRN
  Administered 2020-04-02 (×2): 3 mg via ORAL
  Filled 2020-04-02: qty 9

## 2020-04-02 MED ORDER — FUROSEMIDE 10 MG/ML IJ SOLN
20.0000 mg | Freq: Once | INTRAMUSCULAR | Status: AC
  Administered 2020-04-02: 20 mg via INTRAVENOUS
  Filled 2020-04-02: qty 2

## 2020-04-02 NOTE — Progress Notes (Signed)
Patient oxygen saturations variable throughout day.  At rest sating as high as 95%, but continues to desat with any movement.  Tried to wean oxygen, but unable to keep sats above 88%.  Patient remains on 12L HFNC.

## 2020-04-02 NOTE — Progress Notes (Signed)
PROGRESS NOTE    Bradley Johns  [id number] DOB: 12/28/1934 DOA: 03/31/2020 PCP: Colon Branch, MD   Brief Narrative:  HPI: Bradley Johns is a 85 y.o. 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  He was sleeping for most of the evening but came downstairs for a snack about 0330.  He was heading back upstairs after a couple of hours and was pushing his oxygen tank when suddenly he was in the floor.  "They say that my oxygen level was terrible."   He does not remember feeling bad prior to the fall.  He pushes his oxygen tank when he goes up/down stairs.  He does remember feeling light-headed, dizzy, SOB prior to fall.  When he is doing strenuous exercise he gets SOB; this has been an issue for a while.  He is on 3-5L home O2, has been getting more SOB progressively.  He has intermittent BRB in his urine with clotting periodically, present for a while.  He doesn't leave his property at all for the last 2 years other than to doctor appointments.  When he gets up to walk within the house, he is very tremulous.  Sometimes he misjudges distances and almost falls - that may have happened today.  He does report some memory issues.    ED Course:  Syncope while walking with O2 tank.  Hgb 5, has had exertional SOB.  Heme negative.  Usually on 3.5L home O2, has needed as much as 6, currently on Venturi mask.  CXR with volume overload vs. PNA.  Troponin 19, no chest pain.  EKG unremarkable.  Has nasal fractures, ENT consult pending.  Assessment & Plan:   Principal Problem:   Symptomatic anemia Active Problems:   Diabetes mellitus type 2 in nonobese (HCC)   Hyperlipidemia   Essential hypertension   COPD with emphysema, on O2   BPH (benign prostatic hyperplasia)   Pulmonary fibrosis (HCC)   Chronic respiratory failure with hypoxia (HCC)   Syncope and collapse   AKI (acute kidney injury) ([place])  Symptomatic  anemia: Upon chart review, it appears that patient was hospitalized from 05/20/2018 through 05/25/2018 and he was diagnosed to have diverticular.  Which is stopped by itself and did not require any colonoscopy.  His hemoglobin was 5 upon admission this time.  Patient denies seeing any dark stools or bright red blood in stools.  He does endorse having some gross hematuria however since he has been hospitalized, there has been no evidence of hematuria FOBT x1 is negative as well.  Iron studies also indicate iron deficiency anemia which is consistent with possible slow and chronic bleed.  I suspect that he is probably having intermittent diverticular bleed.  FOBT has been ordered again since yesterday but it has not been sent yet.  He already received 2 units of PRBC transfusion.  His hemoglobin improved over 8 but now dropped to 7.1.  Will recheck once again and due to acute respiratory failure, will plan to keep his hemoglobin over 8 and transfuse if it is under 8.  Syncope: This could very well be due to orthostatic hypotension as he also has AKI and it could be also due to symptomatic anemia.  Continue to monitor on telemetry.  So far no events.  Echo does not show any significant valvular heart disease PT OT consulted.  Acute on Chronic respiratory failure with hypoxia due to COPD, pulmonary fibrosis  and acute on chronic diastolic congestive heart failure: -Patient is on chronic [place] O2, generally 3-4.5L  -He has advanced COPD and pulmonary fibrosis Chest x-ray shows pulmonary vascular congestion and possible patchy bibasilar airspace opacity which may be atelectasis or pneumonia.  Patient does not have any symptoms suggestive of pneumonia with normal leukocytosis, no fever and normal procalcitonin.  PE also ruled out.  He is requiring much more oxygen compared to yesterday.  He has crackles and rhonchi bilaterally.  Chest x-ray shows worsening interstitial infiltrates and vascular congestion.  At this point  in time, it appears that he is having combination of worsening pulmonary fibrosis as well as acute on chronic diastolic congestive heart failure.  We will start him on IV diuresis with 60 mg IV Lasix this morning and then starting tonight, he will be on 40 IV twice daily.  We will start him on Solu-Medrol.  AKI -Last creatinine was normal almost 2 years ago Presented with creatinine of 1.8 and has been stable.  Monitor daily  DM type II: Hemoglobin A1c is 5.7 only.  Continue to hold Glucophage and Actos.  Continue SSI.  HTN: Only slightly elevated -Continue Norvasc, Coreg and Cardura.  Add as needed hydralazine. -Continue to hold Cozaar in the setting of AKI  HLD -Continue Lipitor  BPH -Continue Cardura -He has chronic urinary incontinence.  Hypomagnesemia: 1.6.  Replace.  Recheck in the morning.  Chronic pain: Continue home medication.  DVT prophylaxis: enoxaparin (LOVENOX) injection 40 mg Start: 03/31/20 1200   Code Status: Full Code  Family Communication: None present at bedside.  Plan of care discussed with patient in length and he verbalized understanding and agreed with it.  Status is: Inpatient  Remains inpatient appropriate because:Inpatient level of care appropriate due to severity of illness   Dispo: The patient is from: Home              Anticipated d/c is to: Home              Anticipated d/c date is: 2 days              Patient currently is not medically stable to d/c.        Estimated body mass index is 27.23 kg/m as calculated from the following:   Height as of this encounter: 5\' 8"  (1.727 m).   Weight as of this encounter: 81.2 kg.      Nutritional status:               Consultants:   None  Procedures:   None  Antimicrobials:  Anti-infectives (From admission, onward)   None         Subjective: Patient seen and examined.  He is now requiring 12 to 30 L of oxygen high flow.  Despite of this, he is able to speak in  full sentences.  Complains of some shortness of breath but he still looks stable.  Objective: Vitals:   04/02/20 0447 04/02/20 0730 04/02/20 0959 04/02/20 1106  BP: (!) 159/65 (!) 171/67 (!) 153/57 (!) 126/50  Pulse: 72 68 81 69  Resp: (!) 24 20 (!) 22 20  Temp: 98 F (36.7 C) 98 F (36.7 C)  97.8 F (36.6 C)  TempSrc: Oral Oral  Oral  SpO2: 95% 94% 95% 99%  Weight: 81.2 kg     Height:        Intake/Output Summary (Last 24 hours) at 04/02/2020 1312 Last data filed at 04/02/2020 1144 Gross per 24  hour  Intake 2554.29 ml  Output 2900 ml  Net -345.71 ml   Filed Weights   03/31/20 0623 04/01/20 0445 04/02/20 0447  Weight: 86.2 kg 82.2 kg 81.2 kg    Examination:  General exam: Appears calm and comfortable but chronically sick Respiratory system: Rhonchi and crackles bilaterally. Respiratory effort normal. Cardiovascular system: S1 & S2 heard, RRR. No JVD, murmurs, rubs, gallops or clicks. No pedal edema. Gastrointestinal system: Abdomen is nondistended, soft and nontender. No organomegaly or masses felt. Normal bowel sounds heard. Central nervous system: Alert and oriented. No focal neurological deficits. Extremities: Symmetric 5 x 5 power. Skin: No rashes, lesions or ulcers.  Psychiatry: Judgement and insight appear normal. Mood & affect appropriate.   Data Reviewed: I have personally reviewed following labs and imaging studies  CBC: Recent Labs  Lab 03/31/20 0708 03/31/20 0759 03/31/20 1750 04/01/20 0509 04/01/20 1649 04/02/20 0207  WBC 6.1  --  7.0 8.3 7.5 8.1  NEUTROABS 4.8  --   --   --  5.9 5.9  HGB 5.0* 5.8* 6.9* 8.0* 7.6* 7.1*  HCT 19.8* 17.0* 24.2* 26.1* 26.0* 24.2*  MCV 88.0  --  83.7 82.1 83.3 83.2  PLT 213  --  197 218 195 [id number]   Basic Metabolic Panel: Recent Labs  Lab 03/31/20 0708 03/31/20 0759 04/01/20 0509 04/02/20 0207  NA 139 139 139 138  K 4.5 4.3 4.2 4.3  CL 106  --  106 104  CO2 17*  --  26 26  GLUCOSE 228*  --  96 122*  BUN 26*   --  31* 25*  CREATININE 1.88*  --  1.66* 1.78*  CALCIUM 7.7*  --  8.7* 8.6*  MG  --   --   --  1.6*   GFR: Estimated Creatinine Clearance: 30.4 mL/min (A) (by C-G formula based on SCr of 1.78 mg/dL (H)). Liver Function Tests: Recent Labs  Lab 03/31/20 0708 04/02/20 0207  AST 30 27  ALT 18 21  ALKPHOS 47 64  BILITOT 0.8 0.9  PROT 5.6* 6.2*  ALBUMIN 2.7* 2.8*   No results for input(s): LIPASE, AMYLASE in the last 168 hours. No results for input(s): AMMONIA in the last 168 hours. Coagulation Profile: No results for input(s): INR, PROTIME in the last 168 hours. Cardiac Enzymes: No results for input(s): CKTOTAL, CKMB, CKMBINDEX, TROPONINI in the last 168 hours. BNP (last 3 results) No results for input(s): PROBNP in the last 8760 hours. HbA1C: Recent Labs    03/31/20 1112  HGBA1C 5.7*   CBG: Recent Labs  Lab 04/01/20 1237 04/01/20 1719 04/01/20 2110 04/02/20 0849 04/02/20 1105  GLUCAP 137* 148* 108* 110* 198*   Lipid Profile: No results for input(s): CHOL, HDL, LDLCALC, TRIG, CHOLHDL, LDLDIRECT in the last 72 hours. Thyroid Function Tests: Recent Labs    03/31/20 1112  TSH 2.999   Anemia Panel: Recent Labs    03/31/20 0757 03/31/20 1112  VITAMINB12 276 257  FOLATE 26.6 19.3  FERRITIN 11* 11*  TIBC 410 405  IRON 20* 20*  RETICCTPCT 2.3 1.8   Sepsis Labs: Recent Labs  Lab 04/02/20 0207  PROCALCITON <0.10    Recent Results (from the past 240 hour(s))  Resp Panel by RT-PCR (Flu A&B, Covid) Nasopharyngeal Swab     Status: None   Collection Time: 03/31/20  7:05 AM   Specimen: Nasopharyngeal Swab; Nasopharyngeal(NP) swabs in vial transport medium  Result Value Ref Range Status   SARS Coronavirus 2 by RT PCR NEGATIVE  NEGATIVE Final    Comment: (NOTE) SARS-CoV-2 target nucleic acids are NOT DETECTED.  The SARS-CoV-2 RNA is generally detectable in upper respiratory specimens during the acute phase of infection. The lowest concentration of SARS-CoV-2  viral copies this assay can detect is 138 copies/mL. A negative result does not preclude SARS-Cov-2 infection and should not be used as the sole basis for treatment or other patient management decisions. A negative result may occur with  improper specimen collection/handling, submission of specimen other than nasopharyngeal swab, presence of viral mutation(s) within the areas targeted by this assay, and inadequate number of viral copies(<138 copies/mL). A negative result must be combined with clinical observations, patient history, and epidemiological information. The expected result is Negative.  Fact Sheet for Patients:  EntrepreneurPulse.com.au  Fact Sheet for Healthcare Providers:  IncredibleEmployment.be  This test is no t yet approved or cleared by the [place] FDA and  has been authorized for detection and/or diagnosis of SARS-CoV-2 by FDA under an Emergency Use Authorization (EUA). This EUA will remain  in effect (meaning this test can be used) for the duration of the COVID-19 declaration under Section 564(b)(1) of the Act, 21 U.S.C.section 360bbb-3(b)(1), unless the authorization is terminated  or revoked sooner.       Influenza A by PCR NEGATIVE NEGATIVE Final   Influenza B by PCR NEGATIVE NEGATIVE Final    Comment: (NOTE) The Xpert Xpress SARS-CoV-2/FLU/RSV plus assay is intended as an aid in the diagnosis of influenza from Nasopharyngeal swab specimens and should not be used as a sole basis for treatment. Nasal washings and aspirates are unacceptable for Xpert Xpress SARS-CoV-2/FLU/RSV testing.  Fact Sheet for Patients: EntrepreneurPulse.com.au  Fact Sheet for Healthcare Providers: IncredibleEmployment.be  This test is not yet approved or cleared by the [place] FDA and has been authorized for detection and/or diagnosis of SARS-CoV-2 by FDA under an Emergency Use Authorization  (EUA). This EUA will remain in effect (meaning this test can be used) for the duration of the COVID-19 declaration under Section 564(b)(1) of the Act, 21 U.S.C. section 360bbb-3(b)(1), unless the authorization is terminated or revoked.  Performed at [facility], [place] [address], [place], [place] [address]       Radiology Studies: NM Pulmonary Perfusion  Result Date: 04/01/2020 CLINICAL DATA:  Pulmonary fibrosis.  COPD.  Diabetes EXAM: NUCLEAR MEDICINE PERFUSION LUNG SCAN TECHNIQUE: Perfusion images were obtained in multiple projections after intravenous injection of radiopharmaceutical. RADIOPHARMACEUTICALS:  4.1 mCi Tc-35m MAA COMPARISON:  Chest CT 06/09/2018, CT 06/09/2018, radiograph 06/02/2019 FINDINGS: There multiple small peripheral perfusion defects within the bilateral upper and lower lobes. Small defects are favored related to COPD and interstitial fibrosis seen on comparison CT. There is patchy peripheral airspace disease also seen on radiograph 1 day prior. IMPRESSION: 1. No convincing evidence acute pulmonary embolism. 2. Multiple small peripheral perfusion defects likely a combination of COPD, interstitial fibrosis, and multifocal peripheral airspace opacities seen on comparison radiograph 1 day prior. Electronically Signed   By: Suzy Bouchard M.D.   On: 04/01/2020 16:53   DG CHEST PORT 1 VIEW  Result Date: 04/02/2020 CLINICAL DATA:  Dyspnea. EXAM: PORTABLE CHEST 1 VIEW COMPARISON:  One-view chest x-ray 03/31/2020 FINDINGS: Heart is enlarged. Atherosclerotic calcifications are present at the arch. Diffuse interstitial and airspace opacities have increased significantly since the prior exam, predominantly in the lower lobes. Small effusions are suspected. Axial skeleton is unremarkable. IMPRESSION: 1. Cardiomegaly with increasing interstitial and airspace disease, compatible with congestive heart failure. Infection is  not excluded. 2. Probable small bilateral pleural  effusions. Electronically Signed   By: San Morelle M.D.   On: 04/02/2020 01:36   ECHOCARDIOGRAM COMPLETE  Result Date: 04/01/2020    ECHOCARDIOGRAM REPORT   Patient Name:   Bradley Johns Date of Exam: 04/01/2020 Medical Rec #:  [id number]       Height:       68.0 in Accession #:    [id number]      Weight:       181.2 lb Date of Birth:  12-03-1934        BSA:          1.960 m Patient Age:    69 years        BP:           148/56 mmHg Patient Gender: M               HR:           73 bpm. Exam Location:  Inpatient Procedure: 2D Echo, Cardiac Doppler and Color Doppler Indications:    R55 Syncope  History:        Patient has prior history of Echocardiogram examinations, most                 recent 12/30/2012. COPD.  Sonographer:    Merrie Roof RDCS Referring Phys: [place]  1. Left ventricular ejection fraction, by estimation, is 60 to 65%. The left ventricle has normal function. The left ventricle has no regional wall motion abnormalities. There is mild concentric left ventricular hypertrophy. Left ventricular diastolic parameters were normal.  2. Right ventricular systolic function is normal. The right ventricular size is normal. There is moderately elevated pulmonary artery systolic pressure. The estimated right ventricular systolic pressure is [id number] mmHg.  3. Left atrial size was mildly dilated.  4. The mitral valve is normal in structure. Mild mitral valve regurgitation. No evidence of mitral stenosis.  5. Tricuspid valve regurgitation is mild to moderate.  6. The aortic valve is normal in structure. There is moderate calcification of the aortic valve. There is moderate thickening of the aortic valve. Aortic valve regurgitation is not visualized. Mild aortic valve stenosis. Aortic valve mean gradient measures 9.0 mmHg.  7. The inferior vena cava is dilated in size with >50% respiratory variability, suggesting right atrial pressure of 8 mmHg. FINDINGS  Left Ventricle: Left  ventricular ejection fraction, by estimation, is 60 to 65%. The left ventricle has normal function. The left ventricle has no regional wall motion abnormalities. The left ventricular internal cavity size was normal in size. There is  mild concentric left ventricular hypertrophy. Left ventricular diastolic parameters were normal. Right Ventricle: The right ventricular size is normal. No increase in right ventricular wall thickness. Right ventricular systolic function is normal. There is moderately elevated pulmonary artery systolic pressure. The tricuspid regurgitant velocity is 3.55 m/s, and with an assumed right atrial pressure of 8 mmHg, the estimated right ventricular systolic pressure is [id number] mmHg. Left Atrium: Left atrial size was mildly dilated. Right Atrium: Right atrial size was normal in size. Pericardium: There is no evidence of pericardial effusion. Mitral Valve: The mitral valve is normal in structure. Mild mitral valve regurgitation. No evidence of mitral valve stenosis. Tricuspid Valve: The tricuspid valve is normal in structure. Tricuspid valve regurgitation is mild to moderate. No evidence of tricuspid stenosis. Aortic Valve: The aortic valve is normal in structure. There is moderate calcification of the aortic valve. There is  moderate thickening of the aortic valve. Aortic valve regurgitation is not visualized. Mild aortic stenosis is present. Aortic valve mean  gradient measures 9.0 mmHg. Aortic valve peak gradient measures 17.5 mmHg. Aortic valve area, by VTI measures 1.20 cm. Pulmonic Valve: The pulmonic valve was normal in structure. Pulmonic valve regurgitation is mild. No evidence of pulmonic stenosis. Aorta: The aortic root is normal in size and structure. Venous: The inferior vena cava is dilated in size with greater than 50% respiratory variability, suggesting right atrial pressure of 8 mmHg. IAS/Shunts: No atrial level shunt detected by color flow Doppler.  LEFT VENTRICLE PLAX 2D LVIDd:          4.40 cm     Diastology LVIDs:         3.10 cm     LV e' medial:    6.85 cm/s LV PW:         1.10 cm     LV E/e' medial:  18.0 LV IVS:        1.10 cm     LV e' lateral:   11.20 cm/s LVOT diam:     1.70 cm     LV E/e' lateral: 11.0 LV SV:         53 LV SV Index:   27 LVOT Area:     2.27 cm  LV Volumes (MOD) LV vol d, MOD A4C: 90.0 ml LV vol s, MOD A4C: 36.3 ml LV SV MOD A4C:     90.0 ml RIGHT VENTRICLE          IVC RV Basal diam:  4.10 cm  IVC diam: 2.10 cm RV Mid diam:    3.60 cm LEFT ATRIUM             Index       RIGHT ATRIUM           Index LA diam:        4.10 cm 2.09 cm/m  RA Area:     20.00 cm LA Vol (A2C):   89.8 ml 45.82 ml/m RA Volume:   59.20 ml  30.21 ml/m LA Vol (A4C):   33.2 ml 16.94 ml/m LA Biplane Vol: 57.4 ml 29.29 ml/m  AORTIC VALVE AV Area (Vmax):    1.15 cm AV Area (Vmean):   1.20 cm AV Area (VTI):     1.20 cm AV Vmax:           209.00 cm/s AV Vmean:          133.333 cm/s AV VTI:            0.443 m AV Peak Grad:      17.5 mmHg AV Mean Grad:      9.0 mmHg LVOT Vmax:         106.00 cm/s LVOT Vmean:        70.400 cm/s LVOT VTI:          0.235 m LVOT/AV VTI ratio: 0.53  AORTA Ao Root diam: 2.80 cm Ao Asc diam:  2.80 cm MITRAL VALVE                TRICUSPID VALVE MV Area (PHT): 5.42 cm     TR Peak grad:   50.4 mmHg MV Decel Time: 140 msec     TR Vmax:        355.00 cm/s MV E velocity: 123.00 cm/s MV A velocity: 104.00 cm/s  SHUNTS MV E/A ratio:  1.18  Systemic VTI:  0.24 m                             Systemic Diam: 1.70 cm Ena Dawley MD Electronically signed by Ena Dawley MD Signature Date/Time: 04/01/2020/3:03:40 PM    Final    VAS [place] LOWER EXTREMITY VENOUS (DVT)  Result Date: 04/01/2020  Lower Venous DVT Study Indications: SOB.  Risk Factors: Immobility. Anticoagulation: Lovenox. Comparison Study: 05-26-2018 LT lower extremity venous study available. Performing Technologist: Darlin Coco, RDMS  Examination Guidelines: A complete evaluation includes B-mode  imaging, spectral Doppler, color Doppler, and power Doppler as needed of all accessible portions of each vessel. Bilateral testing is considered an integral part of a complete examination. Limited examinations for reoccurring indications may be performed as noted. The reflux portion of the exam is performed with the patient in reverse Trendelenburg.  +---------+---------------+---------+-----------+----------+--------------+ RIGHT    CompressibilityPhasicitySpontaneityPropertiesThrombus Aging +---------+---------------+---------+-----------+----------+--------------+ CFV      Full           Yes      Yes                                 +---------+---------------+---------+-----------+----------+--------------+ SFJ      Full                                                        +---------+---------------+---------+-----------+----------+--------------+ FV Prox  Full                                                        +---------+---------------+---------+-----------+----------+--------------+ FV Mid   Full                                                        +---------+---------------+---------+-----------+----------+--------------+ FV DistalFull                                                        +---------+---------------+---------+-----------+----------+--------------+ PFV      Full                                                        +---------+---------------+---------+-----------+----------+--------------+ POP      Full           Yes      Yes                                 +---------+---------------+---------+-----------+----------+--------------+ PTV      Full                                                        +---------+---------------+---------+-----------+----------+--------------+  PERO     Full                                                        +---------+---------------+---------+-----------+----------+--------------+    +---------+---------------+---------+-----------+----------+--------------+ LEFT     CompressibilityPhasicitySpontaneityPropertiesThrombus Aging +---------+---------------+---------+-----------+----------+--------------+ CFV      Full           Yes      Yes                                 +---------+---------------+---------+-----------+----------+--------------+ SFJ      Full                                                        +---------+---------------+---------+-----------+----------+--------------+ FV Prox  Full                                                        +---------+---------------+---------+-----------+----------+--------------+ FV Mid   Full                                                        +---------+---------------+---------+-----------+----------+--------------+ FV DistalFull                                                        +---------+---------------+---------+-----------+----------+--------------+ PFV      Full                                                        +---------+---------------+---------+-----------+----------+--------------+ POP      Full           Yes      Yes                                 +---------+---------------+---------+-----------+----------+--------------+ PTV      Full                                                        +---------+---------------+---------+-----------+----------+--------------+ PERO     Full                                                        +---------+---------------+---------+-----------+----------+--------------+  Summary: RIGHT: - There is no evidence of deep vein thrombosis in the lower extremity.  - No cystic structure found in the popliteal fossa.  LEFT: - There is no evidence of deep vein thrombosis in the lower extremity.  - No cystic structure found in the popliteal fossa.  *See table(s) above for measurements and observations.    Preliminary      Scheduled Meds: . amLODipine  10 mg Oral Daily  . aspirin EC  81 mg Oral Daily  . atorvastatin  80 mg Oral QPM  . carvedilol  12.5 mg Oral BID WC  . doxazosin  4 mg Oral QHS  . enoxaparin (LOVENOX) injection  40 mg Subcutaneous Q24H  . insulin aspart  0-15 Units Subcutaneous TID WC  . insulin aspart  0-5 Units Subcutaneous QHS  . mouth rinse  15 mL Mouth Rinse BID  . methylPREDNISolone (SOLU-MEDROL) injection  60 mg Intravenous Q6H  . sodium chloride flush  3 mL Intravenous Q12H   Continuous Infusions: . sodium chloride       LOS: 2 days   Time spent: 33 minutes   Darliss Cheney, MD [facility]  04/02/2020, 1:12 PM   To contact the attending provider between 7A-7P or the covering provider during after hours 7P-7A, please log into the web site [email or web address].

## 2020-04-02 NOTE — Progress Notes (Addendum)
OT Cancellation Note  Patient Details Name: Bradley Johns MRN: [id number] DOB: 10/09/1934   Cancelled Treatment:    Reason Eval/Treat Not Completed: Patient not medically ready (SpO2 dropping to 70s with rolling in bed per RN. MD state hold till after lasix. Will return as schedule allows.)  [name] M [name] [name] MSOT, OTR/L Acute Rehab Pager: [phone] Office: [phone] 04/02/2020, 8:53 AM

## 2020-04-02 NOTE — Progress Notes (Signed)
Some urine from urinal was spilt in the bed and required bed to be changed.  Patient able to roll side to side for care and changing of sheets with that activity patient oxygen saturation into the 60's on 13L high flow nasal cannula. Patient has taken about 25 minutes to recover oxygen saturation.  While recovering patient wanted to brush teeth and set-up in bed was provided.  Oxygen saturation currently 92-93% on 13L high flow nasal cannula.  [facility] paged.

## 2020-04-02 NOTE — Progress Notes (Signed)
PT Cancellation Note  Patient Details Name: Bradley Johns MRN: [id number] DOB: Jul 04, 1934   Cancelled Treatment:    Reason Eval/Treat Not Completed: Medical issues which prohibited therapy. Per nursing pt continues to have very poor activity tolerance today. Will try again tomorrow.    Angelina Ok [facility] 04/02/2020, 3:49 PM Skip Mayer PT Acute Rehabilitation Services Pager [phone] Office [phone]

## 2020-04-03 ENCOUNTER — Inpatient Hospital Stay (HOSPITAL_COMMUNITY): Payer: Medicare HMO

## 2020-04-03 DIAGNOSIS — R0902 Hypoxemia: Secondary | ICD-10-CM

## 2020-04-03 DIAGNOSIS — I469 Cardiac arrest, cause unspecified: Secondary | ICD-10-CM | POA: Diagnosis present

## 2020-04-03 DIAGNOSIS — R092 Respiratory arrest: Secondary | ICD-10-CM

## 2020-04-03 DIAGNOSIS — R0603 Acute respiratory distress: Secondary | ICD-10-CM | POA: Diagnosis present

## 2020-04-03 LAB — COMPREHENSIVE METABOLIC PANEL
ALT: 19 U/L (ref 0–44)
AST: 22 U/L (ref 15–41)
Albumin: 2.7 g/dL — ABNORMAL LOW (ref 3.5–5.0)
Alkaline Phosphatase: 59 U/L (ref 38–126)
Anion gap: 10 (ref 5–15)
BUN: 28 mg/dL — ABNORMAL HIGH (ref 8–23)
CO2: 27 mmol/L (ref 22–32)
Calcium: 8.6 mg/dL — ABNORMAL LOW (ref 8.9–10.3)
Chloride: 101 mmol/L (ref 98–111)
Creatinine, Ser: 1.55 mg/dL — ABNORMAL HIGH (ref 0.61–1.24)
GFR, Estimated: 44 mL/min — ABNORMAL LOW (ref >60)
Glucose, Bld: 146 mg/dL — ABNORMAL HIGH (ref 70–99)
Potassium: 4.6 mmol/L (ref 3.5–5.1)
Sodium: 138 mmol/L (ref 135–145)
Total Bilirubin: 0.9 mg/dL (ref 0.3–1.2)
Total Protein: 6.2 g/dL — ABNORMAL LOW (ref 6.5–8.1)

## 2020-04-03 LAB — BLOOD GAS, ARTERIAL
Acid-Base Excess: 0.8 mmol/L (ref 0.0–2.0)
Bicarbonate: 27.1 mmol/L (ref 20.0–28.0)
Drawn by: 511147
FIO2: 80
O2 Saturation: 98.7 %
Patient temperature: 36.4
pCO2 arterial: 60.5 mmHg — ABNORMAL HIGH (ref 32.0–48.0)
pH, Arterial: 7.27 — ABNORMAL LOW (ref 7.350–7.450)
pO2, Arterial: 136 mmHg — ABNORMAL HIGH (ref 83.0–108.0)

## 2020-04-03 LAB — GLUCOSE, CAPILLARY
Glucose-Capillary: 184 mg/dL — ABNORMAL HIGH (ref 70–99)
Glucose-Capillary: 165 mg/dL — ABNORMAL HIGH (ref 70–99)
Glucose-Capillary: 170 mg/dL — ABNORMAL HIGH (ref 70–99)
Glucose-Capillary: 143 mg/dL — ABNORMAL HIGH (ref 70–99)
Glucose-Capillary: 120 mg/dL — ABNORMAL HIGH (ref 70–99)
Glucose-Capillary: 107 mg/dL — ABNORMAL HIGH (ref 70–99)

## 2020-04-03 LAB — CBC WITH DIFFERENTIAL/PLATELET
Abs Immature Granulocytes: 0.04 10*3/uL (ref 0.00–0.07)
Basophils Absolute: 0 10*3/uL (ref 0.0–0.1)
Basophils Relative: 0 %
Eosinophils Absolute: 0 10*3/uL (ref 0.0–0.5)
Eosinophils Relative: 0 %
HCT: 26.7 % — ABNORMAL LOW (ref 39.0–52.0)
Hemoglobin: 7.6 g/dL — ABNORMAL LOW (ref 13.0–17.0)
Immature Granulocytes: 1 %
Lymphocytes Relative: 13 %
Lymphs Abs: 0.7 10*3/uL (ref 0.7–4.0)
MCH: 24 pg — ABNORMAL LOW (ref 26.0–34.0)
MCHC: 28.5 g/dL — ABNORMAL LOW (ref 30.0–36.0)
MCV: 84.2 fL (ref 80.0–100.0)
Monocytes Absolute: 0.1 10*3/uL (ref 0.1–1.0)
Monocytes Relative: 2 %
Neutro Abs: 4.9 10*3/uL (ref 1.7–7.7)
Neutrophils Relative %: 84 %
Platelets: 191 10*3/uL (ref 150–400)
RBC: 3.17 MIL/uL — ABNORMAL LOW (ref 4.22–5.81)
RDW: 17.2 % — ABNORMAL HIGH (ref 11.5–15.5)
WBC: 5.8 10*3/uL (ref 4.0–10.5)
nRBC: 0.7 % — ABNORMAL HIGH (ref 0.0–0.2)

## 2020-04-03 LAB — TROPONIN I (HIGH SENSITIVITY)
Troponin I (High Sensitivity): 40 ng/L — ABNORMAL HIGH (ref <18)
Troponin I (High Sensitivity): 65 ng/L — ABNORMAL HIGH (ref <18)

## 2020-04-03 LAB — BRAIN NATRIURETIC PEPTIDE: B Natriuretic Peptide: 1120.5 pg/mL — ABNORMAL HIGH (ref 0.0–100.0)

## 2020-04-03 LAB — MAGNESIUM: Magnesium: 2.2 mg/dL (ref 1.7–2.4)

## 2020-04-03 LAB — MRSA PCR SCREENING: MRSA by PCR: NEGATIVE

## 2020-04-03 MED ORDER — METHYLPREDNISOLONE SODIUM SUCC 40 MG IJ SOLR
40.0000 mg | Freq: Two times a day (BID) | INTRAMUSCULAR | Status: DC
  Administered 2020-04-03: 40 mg via INTRAVENOUS
  Filled 2020-04-03: qty 1

## 2020-04-03 MED ORDER — SODIUM CHLORIDE 0.9 % IV SOLN
510.0000 mg | Freq: Once | INTRAVENOUS | Status: AC
  Administered 2020-04-06: 510 mg via INTRAVENOUS
  Filled 2020-04-03 (×2): qty 17

## 2020-04-03 MED ORDER — FUROSEMIDE 10 MG/ML IJ SOLN
80.0000 mg | Freq: Two times a day (BID) | INTRAMUSCULAR | Status: DC
  Administered 2020-04-03 (×2): 80 mg via INTRAVENOUS
  Filled 2020-04-03 (×2): qty 8

## 2020-04-03 MED ORDER — FUROSEMIDE 10 MG/ML IJ SOLN
40.0000 mg | Freq: Once | INTRAMUSCULAR | Status: AC
  Administered 2020-04-03: 40 mg via INTRAVENOUS
  Filled 2020-04-03: qty 4

## 2020-04-03 MED ORDER — SODIUM CHLORIDE 0.9 % IV SOLN
510.0000 mg | Freq: Once | INTRAVENOUS | Status: AC
  Administered 2020-04-03: 510 mg via INTRAVENOUS
  Filled 2020-04-03: qty 17

## 2020-04-03 MED ORDER — IPRATROPIUM-ALBUTEROL 0.5-2.5 (3) MG/3ML IN SOLN
3.0000 mL | Freq: Four times a day (QID) | RESPIRATORY_TRACT | Status: DC
  Administered 2020-04-03 – 2020-04-04 (×6): 3 mL via RESPIRATORY_TRACT
  Filled 2020-04-03 (×7): qty 3

## 2020-04-03 MED ORDER — CHLORHEXIDINE GLUCONATE CLOTH 2 % EX PADS
6.0000 | MEDICATED_PAD | Freq: Every day | CUTANEOUS | Status: DC
  Administered 2020-04-04 – 2020-04-13 (×8): 6 via TOPICAL

## 2020-04-03 NOTE — Progress Notes (Signed)
Chaplain responded to a Code Blue for this patient when he was on 6E.  Chaplain offered support for the staff and MD connected with patient's wife.  She will arrive later this morning.  Patient moved to 2H.  No other needs at this time.  Chaplain will have day Chaplain follow up to check on family.  Chaplain available as needed. Chaplain Agustin Cree, Mdiv.     04/03/20 0600  Clinical Encounter Type  Visited With Health care provider  Visit Type Code  Referral From Nurse  Consult/Referral To Chaplain

## 2020-04-03 NOTE — Progress Notes (Signed)
OT Cancellation Note  Patient Details Name: MAXIMO SPRATLING MRN: [id number] DOB: Feb 06, 1935   Cancelled Treatment:    Reason Eval/Treat Not Completed: Medical issues which prohibited therapy; pt with PEA arrest this morning, transferred to ICU and now on bipap. Will follow up as able for OT eval.  Marcy Siren, OT Acute Rehabilitation Services Pager [phone] Office [phone]   Orlando Penner 04/03/2020, 1:57 PM

## 2020-04-03 NOTE — Code Documentation (Signed)
  Patient Name: Bradley Johns   MRN: [id number]   Date of Birth/ Sex: 09-Feb-1935 , male      Admission Date: 03/31/2020  Attending Provider: Hughie Closs, MD  Primary Diagnosis: Symptomatic anemia   Indication: Pt was in his usual state of health until this AM, when he was noted to be PEA arrest. Code blue was subsequently called. At the time of arrival on scene, ACLS protocol was underway.   Technical Description:  - CPR performance duration:  4 minutes  - Was defibrillation or cardioversion used? No   - Was external pacer placed? Yes  - Was patient intubated pre/post CPR? No   Medications Administered: Y = Yes; Blank = No Amiodarone    Atropine    Calcium    Epinephrine  Y  Lidocaine    Magnesium    Norepinephrine    Phenylephrine    Sodium bicarbonate    Vasopressin     Post CPR evaluation:  - Final Status - Was patient successfully resuscitated ? Yes - What is current rhythm? NSR - What is current hemodynamic status? stable  Miscellaneous Information:  - Labs sent, including: CMP, CBC, Mag, ABG, CBG  - Primary team notified?  Yes  - Family Notified? Yes  - Additional notes/ transfer status: 2H     Doran Stabler, DO  04/03/2020, 5:36 AM

## 2020-04-03 NOTE — Progress Notes (Signed)
Telemetry called and requested RN go check on patient due to patient's heart rate in the 40's.  RN entered room.  Patient was not wearing high flow nasal cannula and was agonal breathing.  Heart rate dropped into the 20's RN could not feel a pulse.  CPR initiated and code blue called.

## 2020-04-03 NOTE — Progress Notes (Signed)
PT Cancellation Note  Patient Details Name: Bradley Johns MRN: [id number] DOB: March 08, 1935   Cancelled Treatment:    Reason Eval/Treat Not Completed: (P) Medical issues which prohibited therapy Pt dropped to SaO2 in 60%s O2 when taken off BiPAP. PT will follow back tomorrow for Evaluation if pt is medically appropriate.   Chelsia Serres B. Beverely Risen PT, DPT Acute Rehabilitation Services Pager [phone] Office [phone]    Bradley Johns [name] 04/03/2020, 1:57 PM

## 2020-04-03 NOTE — Progress Notes (Signed)
Pts sats have been good on BIPAP.  Bradley Johns is wanting a break from it to have some drink etc.  I took pt off BIPAP and placed on 13 liter high flow salter and within 3 minutes pts oxygen began to decline down in the 70's.  Explained to patient the importance and why we are placing him back on continuous BIPAP he understands.

## 2020-04-03 NOTE — Progress Notes (Signed)
Chart reviewed.  It appears that patient was noted to be in PEA arrest.  CODE BLUE was called.  ACLS was a started and ROSC was obtained after 4 minutes.  Patient was placed on BiPAP and subsequently transferred to ICU.  Was evaluated by PCCM.  He remains on BiPAP per chart review.  Discussed with PCCM Dr. Michae Kava who will be assuming care of this patient as primary attending.  Hospitalist service will sign off.  We will be happy to assume care back once he is a stable to be transferred to floor.

## 2020-04-03 NOTE — Progress Notes (Signed)
PT Cancellation Note  Patient Details Name: Bradley Johns MRN: [id number] DOB: 23-Jul-1934   Cancelled Treatment:    Reason Eval/Treat Not Completed: (P) Medical issues which prohibited therapy Pt with PEA arrest and transfer to 2H this morning. Pt currently on BiPAP. PT will follow back this afternoon for Evaluation if pt appropriate.  Lanee Chain B. Beverely Risen PT, DPT Acute Rehabilitation Services Pager [phone] Office [phone]    Elon Alas [name] 04/03/2020, 9:09 AM

## 2020-04-03 NOTE — Progress Notes (Addendum)
Critical care attending progress note:  85 year old man who suffered PEA cardiac arrest secondary to hypoxemia on background of known interstitial lung disease COPD and morbid obesity.  Patient is awake and alert following commands.  He is in no distress.  Chest is clear.  JVP was difficult to examine.  Attempted to separate from BiPAP today but patient rapidly became hypoxic. BiPAP restarted. We will diurese further. Obtain BNP and follow electrolytes.  BNP markedly elevated.  We will stop corticosteroids as no evidence of COPD flare.  Iron deficiency anemia with markedly low ferritin.  Will need a GI evaluation.  In the interim we will correct with IV iron.   CRITICAL CARE Performed by: Lynnell Catalan   Total critical care time: 30 additional minutes  Critical care time was exclusive of separately billable procedures and treating other patients.  Critical care was necessary to treat or prevent imminent or life-threatening deterioration.  Critical care was time spent personally by me on the following activities: development of treatment plan with patient and/or surrogate as well as nursing, discussions with consultants, evaluation of patient's response to treatment, examination of patient, obtaining history from patient or surrogate, ordering and performing treatments and interventions, ordering and review of laboratory studies, ordering and review of radiographic studies, pulse oximetry, re-evaluation of patient's condition and participation in multidisciplinary rounds.  Lynnell Catalan, MD [facility] ICU Physician [facility] [place] Critical [facility]  Pager: [phone] Mobile: [phone] After hours: [phone].

## 2020-04-03 NOTE — Consult Note (Signed)
NAME:  Bradley Johns, MRN:  [id number], DOB:  April 12, 1934, LOS: 3 ADMISSION DATE:  03/31/2020, CONSULTATION DATE:  04/03/2020 REFERRING MD:  Dr Doristine Bosworth, CHIEF COMPLAINT:  PEA arrest   Brief History:  85 year old male admitted 12/25 after syncopal episode and treated for anemia, then went on to have worsening hypoxia and escalating O2 up to 13L high flow. PEA arrest in the early AM hours of 12/28.   History of Present Illness:  85 year old male with past medical history as below, which is significant for pulmonary fibrosis and COPD on 3 to 5 L of home oxygen followed by Dr. Elsworth Soho, OSA, hypertension, diabetes mellitus, and BPH.  He suffered syncopal episode at home on 12/25 causing him to present to [facility] emergency department.  Upon arrival to the ED he was dyspneic and hemoglobin found to be 5.  Hemoccult negative.  Some vague complaints of intermittent hematuria.  He was transfused 2 units of PRBC and admitted to the hospital service.  His course then became complicated for worsening hypoxia.  VQ scan was done and PE was not felt to be likely.  On 12/27 oxygen requirement significantly increased and there was concern for pulmonary edema.  He was started on IV Lasix and Solu-Medrol.  In the early a.m. hours of 12/28 central telemetry notified his nurse of rhythm change at which time he was found to be bradycardic.  Shortly after he lost pulses to PEA arrest.  Very brief duration of CPR (less than 1 round) prior to ROSC.  Postarrest he was awake and alert.  Transferred to ICU for closer monitoring.  Past Medical History:   has a past medical history of Arthritis, Benign prostatic hypertrophy, COPD (chronic obstructive pulmonary disease) ([place]), Diabetes mellitus, Epidermal cyst of face, Gout, Hyperlipidemia, Hypertension, Pulmonary fibrosis ([place]) (2014), and Sleep apnea.   Significant Hospital Events:  12/25 admit, anemic, 2 units PRBC.  12/27 worsening O2 requirements  12/28 PEA arrest tx to  ICU. Started on BiPAP  Consults:    Procedures:    Significant Diagnostic Tests:  CT head 12/25 > No acute intracranial abnormality. Atrophy with chronic small vessel white matter ischemic disease. Age indeterminate bilateral nasal bone fractures. No other acute facial bone fracture. Air-fluid level in the right maxillary sinus. 10 mm irregular right apical pulmonary nodule. Aberrant origin right subclavian artery. V/Q scan 12/26 > no evidence for PE Echo 12/26 > LVEF 60-65%. No regional motion abnormalities.   Micro Data:    Antimicrobials:     Interim History / Subjective:    Objective   Blood pressure (!) 158/68, pulse 63, temperature (!) 97.5 F (36.4 C), temperature source Oral, resp. rate 18, height $RemoveBe'5\' 8"'UayZDoFou$  (1.727 m), weight 81.2 kg, SpO2 98 %.        Intake/Output Summary (Last 24 hours) at 04/03/2020 0611 Last data filed at 04/03/2020 0000 Gross per 24 hour  Intake 910 ml  Output 2295 ml  Net -1385 ml   Filed Weights   03/31/20 0623 04/01/20 0445 04/02/20 0447  Weight: 86.2 kg 82.2 kg 81.2 kg    Examination: General: elderly male in NAD HENT: [place]/AT, PERRL, no JVD Lungs: bibasilar crackles, BiPAP.  Cardiovascular: RRR, no MRG Abdomen: Soft, non-tender, non-distended Extremities: No acute deformity. No peripheral edema.  Neuro: Alert, oriented, non-focal.   Resolved Hospital Problem list     Assessment & Plan:   Cardiac Arrest: brief PEA arrest < 3 minutes. Likely etiology is hypoxia as patient was requiring  13L [place] and was found without nasal cannula in.  - ICU transfer - Awake and following commands so no need for TTM, EEG, CT head.  - AM labs pending - Close monitoring  Acute on chronic hypoxemic, hypercarbic respiratory failure: likely due to pulmonary edema superimposed on known IPF. Echo with Was prescribed OFEV, but was nervous to be seen in office due to COVID-19 pandemic and was never started.  - BiPAP PRN - SpO2 goal 88-95% - Lasix x 2  given 12/17, will await AM labs.  - Follow CXR, ABG  COPD: without acute exacerbation - DC steroids - Continue   Anemia: symptomatic anemia with a hemoglobin of 5. 2 units transfused. Workup so far negative - Follow CBC - Transfuse for Hgb < 7.   AKI - AM labs pending - At risk for electrolyte abnormalities considering diuresis  DM 2: - AC HS SSI  HTN:  - continue home Norvasc, Cardura - Hold home coreg considering bradycardia peri-arrest tonight.  - PRN hydralazine.   Pulmonary nodule - outpatient follow-up  Chronic pain: - DC hydrocodone, morphine post-arrest.   Day team will re-evaluate once AM labs have resulted.   Best practice (evaluated daily)  Diet: Heart heathy Pain/Anxiety/Delirium protocol (if indicated): NA VAP protocol (if indicated): NA DVT prophylaxis: Lovenox GI prophylaxis: PPI Glucose control: SSI Mobility: BR Disposition:ICU  Goals of Care:  Last date of multidisciplinary goals of care discussion: Family and staff present:  Summary of discussion:  Follow up goals of care discussion due:  Code Status: FULL  Labs   CBC: Recent Labs  Lab 03/31/20 0708 03/31/20 0759 04/01/20 0509 04/01/20 1649 04/02/20 0207 04/02/20 1658 04/03/20 0455  WBC 6.1   < > 8.3 7.5 8.1 6.3 5.8  NEUTROABS 4.8  --   --  5.9 5.9 5.5 4.9  HGB 5.0*   < > 8.0* 7.6* 7.1* 7.4* 7.6*  HCT 19.8*   < > 26.1* 26.0* 24.2* 24.4* 26.7*  MCV 88.0   < > 82.1 83.3 83.2 82.4 84.2  PLT 213   < > 218 195 186 200 191   < > = values in this interval not displayed.    Basic Metabolic Panel: Recent Labs  Lab 03/31/20 0708 03/31/20 0759 04/01/20 0509 04/02/20 0207  NA 139 139 139 138  K 4.5 4.3 4.2 4.3  CL 106  --  106 104  CO2 17*  --  26 26  GLUCOSE 228*  --  96 122*  BUN 26*  --  31* 25*  CREATININE 1.88*  --  1.66* 1.78*  CALCIUM 7.7*  --  8.7* 8.6*  MG  --   --   --  1.6*   GFR: Estimated Creatinine Clearance: 30.4 mL/min (A) (by C-G formula based on SCr of 1.78  mg/dL (H)). Recent Labs  Lab 04/01/20 1649 04/02/20 0207 04/02/20 1658 04/03/20 0455  PROCALCITON  --  <0.10  --   --   WBC 7.5 8.1 6.3 5.8    Liver Function Tests: Recent Labs  Lab 03/31/20 0708 04/02/20 0207  AST 30 27  ALT 18 21  ALKPHOS 47 64  BILITOT 0.8 0.9  PROT 5.6* 6.2*  ALBUMIN 2.7* 2.8*   No results for input(s): LIPASE, AMYLASE in the last 168 hours. No results for input(s): AMMONIA in the last 168 hours.  ABG    Component Value Date/Time   PHART 7.270 (L) 04/03/2020 0540   PCO2ART 60.5 (H) 04/03/2020 0540   PO2ART 136 (H)  04/03/2020 0540   HCO3 27.1 04/03/2020 0540   TCO2 26 03/31/2020 0759   ACIDBASEDEF 1.0 03/31/2020 0759   O2SAT 98.7 04/03/2020 0540     Coagulation Profile: No results for input(s): INR, PROTIME in the last 168 hours.  Cardiac Enzymes: No results for input(s): CKTOTAL, CKMB, CKMBINDEX, TROPONINI in the last 168 hours.  HbA1C: Hemoglobin A1C  Date/Time Value Ref Range Status  06/24/2017 12:00 AM 6.3  Final   Hgb A1c MFr Bld  Date/Time Value Ref Range Status  03/31/2020 11:12 AM 5.7 (H) 4.8 - 5.6 % Final    Comment:    (NOTE) Pre diabetes:          5.7%-6.4%  Diabetes:              >6.4%  Glycemic control for   <7.0% adults with diabetes   05/21/2018 02:52 AM 6.9 (H) 4.8 - 5.6 % Final    Comment:    (NOTE) Pre diabetes:          5.7%-6.4% Diabetes:              >6.4% Glycemic control for   <7.0% adults with diabetes     CBG: Recent Labs  Lab 04/02/20 0849 04/02/20 1105 04/02/20 1653 04/02/20 2122 04/03/20 0526  GLUCAP 110* 198* 175* 168* 184*    Review of Systems:   unable  Past Medical History:  He,  has a past medical history of Arthritis, Benign prostatic hypertrophy, COPD (chronic obstructive pulmonary disease) ([place]), Diabetes mellitus, Epidermal cyst of face, Gout, Hyperlipidemia, Hypertension, Pulmonary fibrosis ([place]) (2014), and Sleep apnea.   Surgical History:   Past Surgical History:   Procedure Laterality Date  . COLONOSCOPY WITH PROPOFOL N/A 05/18/2018   Procedure: COLONOSCOPY WITH PROPOFOL;  Surgeon: Carol Ada, MD;  Location: [facility] ENDOSCOPY;  Service: Endoscopy;  Laterality: N/A;  . POLYPECTOMY  05/18/2018   Procedure: POLYPECTOMY;  Surgeon: Carol Ada, MD;  Location: [facility] ENDOSCOPY;  Service: Endoscopy;;  . TOE SURGERY       Social History:   reports that he quit smoking about 24 years ago. His smoking use included cigarettes. He has a 60.00 pack-year smoking history. He has never used smokeless tobacco. He reports current alcohol use. He reports that he does not use drugs.   Family History:  His family history includes Cancer in his brother; Coronary artery disease (age of onset: 53) in his father; Diabetes in his sister; Liver disease in his mother. There is no history of Hypertension, Hyperlipidemia, Heart attack, Prostate cancer, or Colon cancer.   Allergies No Known Allergies   Home Medications  Prior to Admission medications   Medication Sig Start Date End Date Taking? Authorizing Provider  ACCU-CHEK AVIVA PLUS test strip Reported on 06/20/2015 12/13/14  Yes [provider]  ACCU-CHEK SOFTCLIX LANCETS lancets Reported on 06/20/2015 12/13/14  Yes [provider]  amLODipine (NORVASC) 10 MG tablet Take 1 tablet (10 mg total) by mouth daily. 04/15/17  Yes Colon Branch, MD  aspirin 81 MG tablet Take 1 tablet (81 mg total) by mouth daily. Start taking 05/27/18 05/27/18  Yes Pokhrel, Laxman, MD  atorvastatin (LIPITOR) 80 MG tablet Take 1 tablet (80 mg total) by mouth daily. Patient taking differently: Take 80 mg by mouth every evening. 04/15/17  Yes Colon Branch, MD  B-D UF III MINI PEN NEEDLES 31G X 5 MM MISC Reported on 06/20/2015 11/07/14  Yes [provider]  Blood Glucose Monitoring Suppl (ACCU-CHEK AVIVA PLUS) W/DEVICE  KIT Reported on 06/20/2015 10/27/14  Yes [provider]  carvedilol (COREG) 12.5 MG tablet Take 1 tablet (12.5 mg total)  by mouth 2 (two) times daily with a meal. 06/12/17  Yes Paz, Alda Berthold, MD  colchicine 0.6 MG tablet Take 1 tablet (0.6 mg total) by mouth 2 (two) times daily as needed. 12/18/17  Yes Paz, Alda Berthold, MD  doxazosin (CARDURA) 4 MG tablet Take 1 tablet (4 mg total) by mouth at bedtime. 03/01/19  Yes Paz, Alda Berthold, MD  ketoconazole (NIZORAL) 2 % cream Apply 1 application topically daily. Patient taking differently: Apply 1 application topically daily as needed (moisture around groin). 03/24/18  Yes Paz, Alda Berthold, MD  losartan (COZAAR) 50 MG tablet Take 1 tablet (50 mg total) by mouth daily. 06/12/17  Yes Paz, Alda Berthold, MD  metFORMIN (GLUCOPHAGE) 500 MG tablet Take 500 mg by mouth 2 (two) times daily with a meal.   Yes [provider]  Multiple Vitamin (MULTIVITAMIN) tablet Take 1 tablet by mouth daily.   Yes [provider]  OXYGEN Inhale into the lungs. 3L Nocturnal and PRN for Hypoxemia   Yes [provider]  pioglitazone (ACTOS) 45 MG tablet Take 45 mg by mouth daily.   Yes [provider]  HYDROcodone-acetaminophen (NORCO/VICODIN) 5-325 MG tablet Take 1 tablet by mouth every 8 (eight) hours as needed. 07/27/18   Colon Branch, MD     Critical care time: 45 minutes      Georgann Housekeeper, AGACNP-BC [place] for personal pager PCCM on call pager [phone]  04/03/2020 6:51 AM

## 2020-04-04 DIAGNOSIS — R092 Respiratory arrest: Secondary | ICD-10-CM | POA: Diagnosis not present

## 2020-04-04 LAB — BASIC METABOLIC PANEL
Anion gap: 11 (ref 5–15)
BUN: 27 mg/dL — ABNORMAL HIGH (ref 8–23)
CO2: 31 mmol/L (ref 22–32)
Calcium: 8.8 mg/dL — ABNORMAL LOW (ref 8.9–10.3)
Chloride: 99 mmol/L (ref 98–111)
Creatinine, Ser: 1.43 mg/dL — ABNORMAL HIGH (ref 0.61–1.24)
GFR, Estimated: 49 mL/min — ABNORMAL LOW (ref >60)
Glucose, Bld: 110 mg/dL — ABNORMAL HIGH (ref 70–99)
Potassium: 3.5 mmol/L (ref 3.5–5.1)
Sodium: 141 mmol/L (ref 135–145)

## 2020-04-04 LAB — GLUCOSE, CAPILLARY
Glucose-Capillary: 82 mg/dL (ref 70–99)
Glucose-Capillary: 187 mg/dL — ABNORMAL HIGH (ref 70–99)
Glucose-Capillary: 132 mg/dL — ABNORMAL HIGH (ref 70–99)
Glucose-Capillary: 134 mg/dL — ABNORMAL HIGH (ref 70–99)

## 2020-04-04 LAB — BRAIN NATRIURETIC PEPTIDE: B Natriuretic Peptide: 756.9 pg/mL — ABNORMAL HIGH (ref 0.0–100.0)

## 2020-04-04 MED ORDER — INSULIN ASPART 100 UNIT/ML ~~LOC~~ SOLN
0.0000 [IU] | Freq: Three times a day (TID) | SUBCUTANEOUS | Status: DC
  Administered 2020-04-04: 2 [IU] via SUBCUTANEOUS
  Administered 2020-04-04 – 2020-04-05 (×2): 1 [IU] via SUBCUTANEOUS
  Administered 2020-04-05: 2 [IU] via SUBCUTANEOUS
  Administered 2020-04-06: 3 [IU] via SUBCUTANEOUS
  Administered 2020-04-06: 2 [IU] via SUBCUTANEOUS
  Administered 2020-04-07 – 2020-04-09 (×7): 1 [IU] via SUBCUTANEOUS
  Administered 2020-04-10: 2 [IU] via SUBCUTANEOUS
  Administered 2020-04-11: 1 [IU] via SUBCUTANEOUS
  Administered 2020-04-12: 2 [IU] via SUBCUTANEOUS
  Administered 2020-04-12: 1 [IU] via SUBCUTANEOUS
  Administered 2020-04-13: 2 [IU] via SUBCUTANEOUS

## 2020-04-04 MED ORDER — POTASSIUM CHLORIDE CRYS ER 20 MEQ PO TBCR
40.0000 meq | EXTENDED_RELEASE_TABLET | Freq: Once | ORAL | Status: AC
  Administered 2020-04-04: 40 meq via ORAL
  Filled 2020-04-04: qty 2

## 2020-04-04 MED ORDER — INSULIN ASPART 100 UNIT/ML ~~LOC~~ SOLN
0.0000 [IU] | Freq: Every day | SUBCUTANEOUS | Status: DC
  Administered 2020-04-12 – 2020-04-13 (×2): 2 [IU] via SUBCUTANEOUS

## 2020-04-04 MED ORDER — GUAIFENESIN ER 600 MG PO TB12
600.0000 mg | ORAL_TABLET | Freq: Two times a day (BID) | ORAL | Status: DC
  Administered 2020-04-04 – 2020-04-13 (×19): 600 mg via ORAL
  Filled 2020-04-04 (×20): qty 1

## 2020-04-04 MED ORDER — FUROSEMIDE 10 MG/ML IJ SOLN
80.0000 mg | Freq: Two times a day (BID) | INTRAMUSCULAR | Status: AC
  Administered 2020-04-04 – 2020-04-05 (×4): 80 mg via INTRAVENOUS
  Filled 2020-04-04 (×4): qty 8

## 2020-04-04 MED ORDER — CARVEDILOL 12.5 MG PO TABS
12.5000 mg | ORAL_TABLET | Freq: Two times a day (BID) | ORAL | Status: DC
  Administered 2020-04-04 – 2020-04-06 (×4): 12.5 mg via ORAL
  Filled 2020-04-04 (×4): qty 1

## 2020-04-04 MED ORDER — ALBUTEROL SULFATE (2.5 MG/3ML) 0.083% IN NEBU: 2.5000 mg | INHALATION_SOLUTION | RESPIRATORY_TRACT | Status: DC | PRN

## 2020-04-04 NOTE — Progress Notes (Addendum)
NAME:  Bradley Johns, MRN:  [id number], DOB:  Apr 01, 1935, LOS: 4 ADMISSION DATE:  03/31/2020, CONSULTATION DATE:  04/03/2020 REFERRING MD:  Dr Doristine Bosworth, CHIEF COMPLAINT:  PEA arrest   Brief History:  85 year old male admitted 12/25 after syncopal episode and treated for anemia, then went on to have worsening hypoxia and escalating O2 up to 13L high flow. Brief PEA arrest in the early AM hours of 12/28.  Transferred to ICU, placed on BiPAP and diuresed.   History of Present Illness:  85 year old male with past medical history as below, which is significant for pulmonary fibrosis and COPD on 3 to 5 L of home oxygen followed by Dr. Elsworth Soho, OSA, hypertension, diabetes mellitus, and BPH.  He suffered syncopal episode at home on 12/25 causing him to present to [facility] emergency department.  Upon arrival to the ED he was dyspneic and hemoglobin found to be 5.  Hemoccult negative.  Some vague complaints of intermittent hematuria.  He was transfused 2 units of PRBC and admitted to the hospital service.  His course then became complicated for worsening hypoxia.  VQ scan was done and PE was not felt to be likely.  On 12/27 oxygen requirement significantly increased and there was concern for pulmonary edema.  He was started on IV Lasix and Solu-Medrol.  In the early a.m. hours of 12/28 central telemetry notified his nurse of rhythm change at which time he was found to be bradycardic.  Shortly after he lost pulses to PEA arrest.  Very brief duration of CPR (less than 1 round) prior to ROSC.  Postarrest he was awake and alert.  Transferred to ICU for closer monitoring.  Past Medical History:   has a past medical history of Arthritis, Benign prostatic hypertrophy, COPD (chronic obstructive pulmonary disease) ([place]), Diabetes mellitus, Epidermal cyst of face, Gout, Hyperlipidemia, Hypertension, Pulmonary fibrosis ([place]) (2014), and Sleep apnea.   Significant Hospital Events:  12/25 admit, anemic, 2 units PRBC.   12/27 worsening O2 requirements  12/28 PEA arrest tx to ICU. Started on BiPAP  Consults:    Procedures:    Significant Diagnostic Tests:  CT head 12/25 > No acute intracranial abnormality. Atrophy with chronic small vessel white matter ischemic disease. Age indeterminate bilateral nasal bone fractures. No other acute facial bone fracture. Air-fluid level in the right maxillary sinus. 10 mm irregular right apical pulmonary nodule. Aberrant origin right subclavian artery. V/Q scan 12/26 > no evidence for PE Echo 12/26 > LVEF 60-65%. No regional motion abnormalities.   Micro Data:  12/25 SARS/ flu  >> neg 12/28 MRSA > neg  Antimicrobials:   n/a  Interim History / Subjective:  Off BiPAP at 0600, tolerating salter HFNC well thus far, at 12L.  No complaints of SOB at rest or pain, other than nasal bridge soreness from BiPAP.  Still has exertional dyspnea.  Diuresed 3.2L BNP 1120-> 756  Objective   Blood pressure (!) 147/56, pulse 74, temperature 98.3 F (36.8 C), resp. rate 14, height 5\' 8"  (1.727 m), weight 80.6 kg, SpO2 95 %.    Vent Mode: BIPAP FiO2 (%):  [60 %] 60 % Set Rate:  [10 bmp] 10 bmp PEEP:  [5 cmH20] 5 cmH20   Intake/Output Summary (Last 24 hours) at 04/04/2020 0927 Last data filed at 04/04/2020 0800 Gross per 24 hour  Intake 50 ml  Output 3425 ml  Net -3375 ml   Filed Weights   04/02/20 0447 04/03/20 0600 04/04/20 0400  Weight: 81.2 kg  80.6 kg 80.6 kg    Examination: General:  Chronically ill / frail elderly male sitting upright in bed in NAD HEENT: MM pink/moist, pupils 4/reactive Neuro: Alert, oriented x 3, MAE CV: rr, no murmur PULM:  Non labored, speaking in full sentences, anterior clear, bibasilar rales, scattered wheeze GI: soft, bs active  Extremities: warm/dry, no LE edema  Skin: no rashes  No CXR today Labs reviewed: K 3.5, sCr 1.55-> 1.43, BNP 1120-> 756, Hgb 7.4-> 7.6 Afebrile  Neg PCT 12/27 UOP 3.2L / 24 hrs -3.2L/ 24hrs / overall  net -1.9L    Resolved Hospital Problem list     Assessment & Plan:   Cardiac Arrest: brief PEA arrest < 3 minutes. Likely etiology is hypoxia as patient was requiring 13L [place] and was found without nasal cannula in and component of heart failure with elevated BNP. TTE 12/26 shows normal LVEF, normal wall function and normal diastolic parameters; elevated PAP 58.4, normal RV.  NM perfusion test no evidence of acute PE; several multiple peripheral perfusion defects due to combination of COPD/ IPF - has remained neurologically intact, hemodynamically stable and weaning O2 requirements.  He is stable to transfer back to PCU and TRH as of 12/30. - continue diuresis as below, BNP remains elevated, although clinically euvolemic - trend BNP   HTN:  - resume home Norvasc, Cardura, add back coreg today  - PRN hydralazine - lasix as below   Acute on chronic hypoxemic, hypercarbic respiratory failure: likely due to pulmonary edema superimposed on known IPF. Echo with Was prescribed OFEV, but was nervous to be seen in office due to COVID-19 pandemic and was never started.  - continue BiPAP PRN; continue HHFNC weaning O2 for sat goal 88-94% - diurese as below; day 2x/3 - ongoing aggressive pulmonary hygiene/ IS/ PT - intermittent CXR - steroids 12/27 > 12/28  COPD: without acute exacerbation - Continue duonebs q 6hr and aggressive pulmonary hygiene - add mucinex   - no role for steroids at this time  Iron deficiency Anemia: symptomatic anemia with a hemoglobin of 5. 2 units transfused. Markedly low ferritin 11 - Follow CBC; Hgb trend stable  - Transfuse for Hgb < 7 - feraheme started 12/28; next dose 12/31 - will need further outpt GI f/u   AKI Hypokalemia 2/2 diuresis  - s/p diureses 12/28 with -3.2L output; remains net negative 2L - sCr stable  - continue lasix 80 mg q 12hr x 4 doses - s/p KCL replete this am; repeat additional KCL this afternoon; follow electrolytes closesly - continue to  trend renal indices/ strict I/Os  DM 2: - AC HS SSI- decrease to sensitive scale   Pulmonary nodule - outpatient follow-up in our pulmonary clinic  Chronic pain: - minimize sedating meds as able given hypercarbic resp failure.  Currently no complaints of pain.  Best practice (evaluated daily)  Diet: Heart heathy Pain/Anxiety/Delirium protocol (if indicated): NA VAP protocol (if indicated): NA DVT prophylaxis: Lovenox GI prophylaxis: PPI Glucose control: SSI Mobility: BR Disposition:ICU-> PCU   Patient remains hemodynamically stable and ready for transfer out of ICU today.  He will be transferred to PCU and back to [facility] service as of 12/30.   PCCM will sign off.  Please do not hesitate to call us back if we can be of any further assistance.   Goals of Care:  Last date of multidisciplinary goals of care discussion: Family and staff present:  Summary of discussion:  Follow up goals of care discussion due:  no family at bedside 12/29; patient updated on plan of care.  Code Status: FULL  Labs   CBC: Recent Labs  Lab 03/31/20 0708 03/31/20 0759 04/01/20 0509 04/01/20 1649 04/02/20 0207 04/02/20 1658 04/03/20 0455  WBC 6.1   < > 8.3 7.5 8.1 6.3 5.8  NEUTROABS 4.8  --   --  5.9 5.9 5.5 4.9  HGB 5.0*   < > 8.0* 7.6* 7.1* 7.4* 7.6*  HCT 19.8*   < > 26.1* 26.0* 24.2* 24.4* 26.7*  MCV 88.0   < > 82.1 83.3 83.2 82.4 84.2  PLT 213   < > 218 195 186 200 191   < > = values in this interval not displayed.    Basic Metabolic Panel: Recent Labs  Lab 03/31/20 0708 03/31/20 0759 04/01/20 0509 04/02/20 0207 04/03/20 0455 04/04/20 0047  NA 139 139 139 138 138 141  K 4.5 4.3 4.2 4.3 4.6 3.5  CL 106  --  106 104 101 99  CO2 17*  --  26 26 27 31   GLUCOSE 228*  --  96 122* 146* 110*  BUN 26*  --  31* 25* 28* 27*  CREATININE 1.88*  --  1.66* 1.78* 1.55* 1.43*  CALCIUM 7.7*  --  8.7* 8.6* 8.6* 8.8*  MG  --   --   --  1.6* 2.2  --    GFR: Estimated Creatinine Clearance: 37.9  mL/min (A) (by C-G formula based on SCr of 1.43 mg/dL (H)). Recent Labs  Lab 04/01/20 1649 04/02/20 0207 04/02/20 1658 04/03/20 0455  PROCALCITON  --  <0.10  --   --   WBC 7.5 8.1 6.3 5.8    Liver Function Tests: Recent Labs  Lab 03/31/20 0708 04/02/20 0207 04/03/20 0455  AST 30 27 22   ALT 18 21 19   ALKPHOS 47 64 59  BILITOT 0.8 0.9 0.9  PROT 5.6* 6.2* 6.2*  ALBUMIN 2.7* 2.8* 2.7*   No results for input(s): LIPASE, AMYLASE in the last 168 hours. No results for input(s): AMMONIA in the last 168 hours.  ABG    Component Value Date/Time   PHART 7.270 (L) 04/03/2020 0540   PCO2ART 60.5 (H) 04/03/2020 0540   PO2ART 136 (H) 04/03/2020 0540   HCO3 27.1 04/03/2020 0540   TCO2 26 03/31/2020 0759   ACIDBASEDEF 1.0 03/31/2020 0759   O2SAT 98.7 04/03/2020 0540     Coagulation Profile: No results for input(s): INR, PROTIME in the last 168 hours.  Cardiac Enzymes: No results for input(s): CKTOTAL, CKMB, CKMBINDEX, TROPONINI in the last 168 hours.  HbA1C: Hemoglobin A1C  Date/Time Value Ref Range Status  06/24/2017 12:00 AM 6.3  Final   Hgb A1c MFr Bld  Date/Time Value Ref Range Status  03/31/2020 11:12 AM 5.7 (H) 4.8 - 5.6 % Final    Comment:    (NOTE) Pre diabetes:          5.7%-6.4%  Diabetes:              >6.4%  Glycemic control for   <7.0% adults with diabetes   05/21/2018 02:52 AM 6.9 (H) 4.8 - 5.6 % Final    Comment:    (NOTE) Pre diabetes:          5.7%-6.4% Diabetes:              >6.4% Glycemic control for   <7.0% adults with diabetes     CBG: Recent Labs  Lab 04/03/20 0755 04/03/20 1118 04/03/20 1539 04/03/20 2217  04/04/20 Banks, ACNP [facility] 04/04/2020, 9:27 AM

## 2020-04-04 NOTE — Progress Notes (Signed)
K 3.5, Creatinine 1.43, GFR 49 Electrolytes replaced per protocol

## 2020-04-04 NOTE — Progress Notes (Signed)
Pt refused BIPAP/CPAP

## 2020-04-04 NOTE — Progress Notes (Signed)
PT Evaluation  Pt admitted with  Diagnosis below. Pt was able to pivot to chair with min guard assist with desaturation to 82% on 12L and incr to 15 L for pt to get to chair. Took 5 minutes for oxygen to recover to 94% on 15L and then turned O2 back to 12L.  Pt should progress well once medically stable. Pt currently with functional limitations due to the deficits listed below (see PT Problem List). Pt will benefit from skilled PT to increase their independence and safety with mobility to allow discharge to the venue listed below.      04/04/20 1000  PT Visit Information  Last PT Received On 04/04/20  Assistance Needed +1  PT/OT/SLP Co-Evaluation/Treatment Yes  Reason for Co-Treatment Complexity of the patient's impairments (multi-system involvement);For patient/therapist safety  PT goals addressed during session Mobility/safety with mobility  History of Present Illness Bradley Johns is a 85 y.o. 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  He was sleeping for most of the evening but came downstairs for a snack about 0330.  He was heading back upstairs after a couple of hours and was pushing his oxygen tank when suddenly he was in the floor.  "They say that my oxygen level was terrible."   He does not remember feeling bad prior to the fall.  He pushes his oxygen tank when he goes up/down stairs.  He does remember feeling light-headed, dizzy, SOB prior to fall.  When he is doing strenuous exercise he gets SOB; this has been an issue for a while.  He is on 3-5L home O2, has been getting more SOB progressively.   Imaging showing age-indeterminate nasal bone fxs. Hospital course with worsening hypoxemia on 12/27. He was started on lasix and steroids. On 12/28,  he became bradycardic and followed by PEA arrest, achieving ROSC in <3 minutes. Patient transferred to ICU for post-arrest. ABG demonstrated acute hypercarbic  respiratory failure. Patient started on BiPAP with improvement in mental status.  Precautions  Precautions Fall  Restrictions  Weight Bearing Restrictions No  Home Living  Family/patient expects to be discharged to: Private residence  Living Arrangements Spouse/significant other  Available Help at Discharge Family;Available PRN/intermittently (wife elderly as well)  Type of Home House  Home Access Stairs to enter  Entrance Stairs-Number of Steps 4  Entrance Stairs-Rails Right  Home Layout Two level;Bed/bath upstairs  Alternate Level Stairs-Number of Steps 13  Alternate Level Stairs-Rails Left  Bathroom Shower/Tub Walk-in Audiological scientist - single point (pulse oximeter, 3.5-4 L oxygen)  Prior Function  Level of Independence Independent with assistive device(s);Needs assistance  Gait / Transfers Assistance Needed used cane PTA, states he stumbles around  Communication  Communication No difficulties  Pain Assessment  Pain Assessment No/denies pain  Cognition  Arousal/Alertness Awake/alert  Behavior During Therapy WFL for tasks assessed/performed  Overall Cognitive Status Within Functional Limits for tasks assessed  Upper Extremity Assessment  Upper Extremity Assessment Defer to OT evaluation  Lower Extremity Assessment  Lower Extremity Assessment Generalized weakness  Cervical / Trunk Assessment  Cervical / Trunk Assessment Normal  Bed Mobility  Overal bed mobility Independent  Transfers  Overall transfer level Needs assistance  Equipment used Rolling walker (2 wheeled)  Transfers Sit to/from Stand;Stand Pivot Transfers  Sit to Stand Min guard  Stand pivot transfers Min guard  General transfer comment No physical assist just guard  for stand and pivot to chair.  Steady with RW.  Desats with transfers to 82% needing O2 from 12L to 15 L.  Ambulation/Gait  General Gait Details Not ready for ambulation yet.  Balance  Overall balance  assessment Needs assistance  Sitting-balance support No upper extremity supported;Feet supported  Sitting balance-Leahy Scale Fair  Standing balance support Bilateral upper extremity supported;During functional activity  Standing balance-Leahy Scale Poor  Standing balance comment Pt was able to stand with RW with min guard assist.  General Comments  General comments (skin integrity, edema, etc.) 65-91 bpm, 89-90% on 12LO2 on arrival.  161/52. Desat to 82% with transfer and nurse incr pt to 15L.  Took 5 min but pt recovered O2 to 94% and was turned back to 12L on departure.  PT - End of Session  Equipment Utilized During Treatment Gait belt;Oxygen  Patient left in chair;with call bell/phone within reach;with chair alarm set  Nurse Communication Mobility status  PT Assessment  PT Visit Diagnosis Unsteadiness on feet (R26.81);Muscle weakness (generalized) (M62.81)  AM-PAC PT "6 Clicks" Mobility Outcome Measure (Version 2)  Help needed turning from your back to your side while in a flat bed without using bedrails? 4  Help needed moving from lying on your back to sitting on the side of a flat bed without using bedrails? 4  Help needed moving to and from a bed to a chair (including a wheelchair)? 3  Help needed standing up from a chair using your arms (e.g., wheelchair or bedside chair)? 3  Help needed to walk in hospital room? 2  Help needed climbing 3-5 steps with a railing?  2  6 Click Score 18  Consider Recommendation of Discharge To: Home with [facility]  PT Recommendation  Follow Up Recommendations Home health PT;Supervision/Assistance - 24 hour  PT equipment Other (comment);3in1 (PT) (rollator)  Acute Rehab PT Goals  Patient Stated Goal to go home  PT Goal Formulation With patient  Time For Goal Achievement 04/18/20  Potential to Achieve Goals Good  PT Time Calculation  PT Start Time (ACUTE ONLY) 1014  PT Stop Time (ACUTE ONLY) 1034  PT Time Calculation (min) (ACUTE ONLY) 20 min  PT  General Charges  $$ ACUTE PT VISIT 1 Visit  PT Evaluation  $PT Eval Moderate Complexity 1 Mod  Written Expression  Dominant Hand Right   Lise Pincus W,PT Acute Rehabilitation Services Pager:  [phone]  Office:  [phone]

## 2020-04-04 NOTE — TOC Initial Note (Signed)
Transition of Care [facility]) - Initial/Assessment Note    Patient Details  Name: Bradley Johns MRN: [id number] Date of Birth: Sep 09, 1934  Transition of Care [facility]) CM/SW Contact:    Joanne Chars, LCSW Phone Number: 04/04/2020, 4:02 PM  Clinical Narrative:  CSW met with pt and daughter to discuss discharge plan.  Permission given to speak with wife and daughter.  Pt lives with wife, some concern about her providing adequate support in the home.  Discussed what services HH provides, discussed additional services from [facility] aides.  Pt and daughter stating they need to think about this.  Choice document given for [facility].  Pt is vaccinated and boosted for covid.  Current equipment in home: shower bench and cane.  O2 supplier in place: [employer].  PCP in place as well.    55: CSW spoke with pt wife Joycelyn Schmid who reports that she believes it is best for pt to be admitted to a facility as things are not working so well at home.  She reports they live in a two story home, pt has issues with the stairs and pt is also just requiring more help with daily activities than she can provide.  CSW discussed that PT/OT are not recommending SNF at this time.  Also discussed difference between rehabilitation and long term care and what medicare will and will not pay for.  Wife is interested in options for looking at long term care.                Expected Discharge Plan: [place] Barriers to Discharge: Continued Medical Work up   Patient Goals and CMS Choice Patient states their goals for this hospitalization and ongoing recovery are:: "breath easier" CMS Medicare.gov Compare Post Acute Care list provided to:: Patient Choice offered to / list presented to : Patient  Expected Discharge Plan and Services Expected Discharge Plan: [place] Choice: [place] arrangements for the past 2 months: Single Family Home                                       Prior Living Arrangements/Services Living arrangements for the past 2 months: Single Family Home Lives with:: Spouse Patient language and need for interpreter reviewed:: Yes Do you feel safe going back to the place where you live?: Yes      Need for Family Participation in Patient Care: No (Comment) Care giver support system in place?: Yes (comment) Current home services: DME ([employer] O2) Criminal Activity/Legal Involvement Pertinent to Current Situation/Hospitalization: No - Comment as needed  Activities of Daily Living Home Assistive Devices/Equipment: Cane (specify quad or straight) ADL Screening (condition at time of admission) Patient's cognitive ability adequate to safely complete daily activities?: Yes Is the patient deaf or have difficulty hearing?: No Does the patient have difficulty seeing, even when wearing glasses/contacts?: No Does the patient have difficulty concentrating, remembering, or making decisions?: No Patient able to express need for assistance with ADLs?: Yes Does the patient have difficulty dressing or bathing?: No Independently performs ADLs?: Yes (appropriate for developmental age) Does the patient have difficulty walking or climbing stairs?: Yes Weakness of Legs: None Weakness of Arms/Hands: None  Permission Sought/Granted Permission sought to share information with : Family Chief Financial Officer Permission granted to share information with : Yes, Verbal Permission Granted  Share Information with  NAME: wife Joycelyn Schmid, daughter [name]  Permission granted to share info w AGENCY: HH        Emotional Assessment Appearance:: Appears stated age Attitude/Demeanor/Rapport: Engaged Affect (typically observed): Appropriate,Pleasant Orientation: : Oriented to Self,Oriented to Place,Oriented to  Time,Oriented to Situation Alcohol / Substance Use: Not Applicable Psych Involvement: No (comment)  Admission diagnosis:  Normocytic anemia  [D64.9] Renal insufficiency [N28.9] Hypoxia [R09.02] Symptomatic anemia [D64.9] Syncope, unspecified syncope type [R55] Patient Active Problem List   Diagnosis Date Noted  . Hypoxia   . Acute respiratory distress   . Cardiac arrest ([place])   . Symptomatic anemia 03/31/2020  . Syncope and collapse 03/31/2020  . AKI (acute kidney injury) ([place]) 03/31/2020  . Chronic respiratory failure with hypoxia ([place]) 06/03/2018  . Anemia associated with acute blood loss 05/20/2018  . Acute GI bleeding 05/17/2018  . PCP NOTES >>>>>>>>>>>>>>>. 12/30/2014  . Pulmonary fibrosis ([place]) 12/14/2012  . Cubital tunnel syndrome on left 11/14/2012  . Dyspnea on exertion 10/11/2012  . Trigger finger of both hands 09/26/2011  . Annual physical exam 03/18/2011  . BPH (benign prostatic hyperplasia) 07/11/2008  . Osteoarthritis 04/18/2008  . Hyperlipidemia 10/06/2006  . Diabetes mellitus type 2 in nonobese ([place]) 04/29/2006  . GOUT 04/29/2006  . Essential hypertension 04/29/2006  . COPD with emphysema, on O2 04/29/2006  . Sleep apnea 04/29/2006   PCP:  Colon Branch, MD Pharmacy:   [place] [place] [address] Phone: [phone] Fax: [facility] [place], [place] [address] Phone: [phone] Fax: [phone]  [place], [place] [address] [place] [address] Phone: [phone] Fax: [phone]     Social Determinants of Health (SDOH) Interventions    Readmission Risk Interventions No flowsheet data found.

## 2020-04-04 NOTE — Plan of Care (Signed)

## 2020-04-04 NOTE — Evaluation (Deleted)
Physical Therapy Evaluation Patient Details Name: Bradley Johns MRN: [id number] DOB: May 05, 1934 Today's Date: 04/04/2020   History of Present Illness  Bradley Johns is a 85 y.o. 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  He was sleeping for most of the evening but came downstairs for a snack about 0330.  He was heading back upstairs after a couple of hours and was pushing his oxygen tank when suddenly he was in the floor.  "They say that my oxygen level was terrible."   He does not remember feeling bad prior to the fall.  He pushes his oxygen tank when he goes up/down stairs.  He does remember feeling light-headed, dizzy, SOB prior to fall.  When he is doing strenuous exercise he gets SOB; this has been an issue for a while.  He is on 3-5L home O2, has been getting more SOB progressively.   Imaging showing age-indeterminate nasal bone fxs. Hospital course with worsening hypoxemia on 12/27. He was started on lasix and steroids. On 12/28,  he became bradycardic and followed by PEA arrest, achieving ROSC in <3 minutes. Patient transferred to ICU for post-arrest. ABG demonstrated acute hypercarbic respiratory failure. Patient started on BiPAP with improvement in mental status.  Clinical Impression  Pt admitted with above diagnosis. Pt was able to pivot to chair with min guard assist with desaturation to 82% on 12L and incr to 15 L for pt to get to chair. Took 5 minutes for oxygen to recover to 94% on 15L and then turned O2 back to 12L.  Pt should progress well once medically stable. Pt currently with functional limitations due to the deficits listed below (see PT Problem List). Pt will benefit from skilled PT to increase their independence and safety with mobility to allow discharge to the venue listed below.    Follow Up Recommendations Home health PT;Supervision/Assistance - 24 hour    Equipment Recommendations   Other (comment);3in1 (PT) (rollator)    Recommendations for Other Services       Precautions / Restrictions Precautions Precautions: Fall Restrictions Weight Bearing Restrictions: No      Mobility  Bed Mobility Overal bed mobility: Independent                  Transfers Overall transfer level: Needs assistance Equipment used: Rolling walker (2 wheeled) Transfers: Sit to/from [facility] Sit to Stand: Min guard Stand pivot transfers: Min guard       General transfer comment: No physical assist just guard for stand and pivot to chair.  Steady with RW.  Desats with transfers to 82% needing O2 from 12L to 15 L.  Ambulation/Gait             General Gait Details: Not ready for ambulation yet.  Stairs            Wheelchair Mobility    Modified Rankin (Stroke Patients Only)       Balance Overall balance assessment: Needs assistance Sitting-balance support: No upper extremity supported;Feet supported Sitting balance-Leahy Scale: Fair     Standing balance support: Bilateral upper extremity supported;During functional activity Standing balance-Leahy Scale: Poor Standing balance comment: Pt was able to stand with RW with min guard assist.                             Pertinent Vitals/Pain Pain Assessment: No/denies pain  Home Living Family/patient expects to be discharged to:: Private residence Living Arrangements: Spouse/significant other Available Help at Discharge: Family;Available PRN/intermittently (wife elderly as well) Type of Home: House Home Access: Stairs to enter Entrance Stairs-Rails: Right Entrance Stairs-Number of Steps: 4 Home Layout: Two level;Bed/bath upstairs Home Equipment: Cane - single point (pulse oximeter, 3.5-4 L oxygen)      Prior Function Level of Independence: Independent with assistive device(s);Needs assistance   Gait / Transfers Assistance Needed: used cane PTA, states he stumbles  around           Hand Dominance   Dominant Hand: Right    Extremity/Trunk Assessment   Upper Extremity Assessment Upper Extremity Assessment: Defer to OT evaluation    Lower Extremity Assessment Lower Extremity Assessment: Generalized weakness    Cervical / Trunk Assessment Cervical / Trunk Assessment: Normal  Communication   Communication: No difficulties  Cognition Arousal/Alertness: Awake/alert Behavior During Therapy: WFL for tasks assessed/performed Overall Cognitive Status: Within Functional Limits for tasks assessed                                        General Comments General comments (skin integrity, edema, etc.): 65-91 bpm, 89-90% on 12LO2 on arrival.  161/52. Desat to 82% with transfer and nurse incr pt to 15L.  Took 5 min but pt recovered O2 to 94% and was turned back to 12L on departure.    Exercises     Assessment/Plan    PT Assessment    PT Problem List         PT Treatment Interventions      PT Goals (Current goals can be found in the Care Plan section)  Acute Rehab PT Goals Patient Stated Goal: to go home PT Goal Formulation: With patient Time For Goal Achievement: 04/18/20 Potential to Achieve Goals: Good    Frequency     Barriers to discharge        Co-evaluation               AM-PAC PT "6 Clicks" Mobility  Outcome Measure Help needed turning from your back to your side while in a flat bed without using bedrails?: None Help needed moving from lying on your back to sitting on the side of a flat bed without using bedrails?: None Help needed moving to and from a bed to a chair (including a wheelchair)?: A Little Help needed standing up from a chair using your arms (e.g., wheelchair or bedside chair)?: A Little Help needed to walk in hospital room?: A Lot Help needed climbing 3-5 steps with a railing? : A Lot 6 Click Score: 18    End of Session Equipment Utilized During Treatment: Gait belt;Oxygen    Patient left: in chair;with call bell/phone within reach;with chair alarm set Nurse Communication: Mobility status PT Visit Diagnosis: Unsteadiness on feet (R26.81);Muscle weakness (generalized) (M62.81)    Time: 9833-8250 PT Time Calculation (min) (ACUTE ONLY): 20 min   Charges:   PT Evaluation $PT Eval Moderate Complexity: 1 Mod          Lashae Wollenberg W,PT Acute Rehabilitation Services Pager:  [phone]  Office:  [phone]    Berline Lopes 04/04/2020, 10:55 AM

## 2020-04-04 NOTE — Progress Notes (Signed)
Occupational Therapy Evaluation Patient Details Name: Bradley Johns MRN: [id number] DOB: [birth year] Today's Date: 04/04/2020    History of Present Illness Bradley Johns is a 85 y.o. 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  He was sleeping for most of the evening but came downstairs for a snack about 0330.  He was heading back upstairs after a couple of hours and was pushing his oxygen tank when suddenly he was in the floor.  "They say that my oxygen level was terrible."   He does not remember feeling bad prior to the fall.  He pushes his oxygen tank when he goes up/down stairs.  He does remember feeling light-headed, dizzy, SOB prior to fall.  When he is doing strenuous exercise he gets SOB; this has been an issue for a while.  He is on 3-5L home O2, has been getting more SOB progressively.   Imaging showing age-indeterminate nasal bone fxs. Hospital course with worsening hypoxemia on 12/27. He was started on lasix and steroids. On 12/28,  he became bradycardic and followed by PEA arrest, achieving ROSC in <3 minutes. Patient transferred to ICU for post-arrest. ABG demonstrated acute hypercarbic respiratory failure. Patient started on BiPAP with improvement in mental status.   Clinical Impression   Pt was functioning modified independently in self care and walking with a cane while reaching for furniture prior to admission. He keeps 02 concentrators on each floor of his home. Pt presents with generalized weakness, impaired standing balance and decreased activity tolerance. He requires min guard assist with RW to transfer and up to min assist for ADL. He was noted to desat to the low 80s on 12L 02. Rebounded to mid 90s on 15L once seated in chair and remained with 02 turned back to 12L. Will follow acutely. Anticipate pt will be able to return home with his wife when medically ready.     Follow Up  Recommendations  Home health OT    Equipment Recommendations  3 in 1 bedside commode    Recommendations for Other Services       Precautions / Restrictions Precautions Precautions: Fall Restrictions Weight Bearing Restrictions: No      Mobility Bed Mobility Overal bed mobility: Modified Independent             General bed mobility comments: HOB    Transfers Overall transfer level: Needs assistance Equipment used: Rolling walker (2 wheeled) Transfers: Sit to/from [facility] Sit to Stand: Min guard Stand pivot transfers: Min guard       General transfer comment: for safety, lines    Balance Overall balance assessment: Needs assistance Sitting-balance support: No upper extremity supported;Feet supported Sitting balance-Leahy Scale: Fair     Standing balance support: Bilateral upper extremity supported;During functional activity Standing balance-Leahy Scale: Poor Standing balance comment: requires support of RW                           ADL either performed or assessed with clinical judgement   ADL Overall ADL's : Needs assistance/impaired Eating/Feeding: Independent;Sitting   Grooming: Set up;Sitting   Upper Body Bathing: Minimal assistance;Sitting   Lower Body Bathing: Minimal assistance;Sit to/from stand   Upper Body Dressing : Minimal assistance;Sitting   Lower Body Dressing: Minimal assistance;Sit to/from stand   Toilet Transfer: Minimal assistance;Stand-pivot;RW   Toileting- Clothing Manipulation and Hygiene: Minimal assistance;Sit to/from stand  General ADL Comments: pt on 12-15 L 02 throughout session     Vision Baseline Vision/History: Wears glasses Patient Visual Report: No change from baseline       Perception     Praxis      Pertinent Vitals/Pain Pain Assessment: No/denies pain     Hand Dominance Right   Extremity/Trunk Assessment Upper Extremity Assessment Upper Extremity  Assessment: Overall WFL for tasks assessed   Lower Extremity Assessment Lower Extremity Assessment: Defer to PT evaluation   Cervical / Trunk Assessment Cervical / Trunk Assessment: Normal   Communication Communication Communication: No difficulties   Cognition Arousal/Alertness: Awake/alert Behavior During Therapy: WFL for tasks assessed/performed Overall Cognitive Status: Within Functional Limits for tasks assessed                                     General Comments  65-91 bpm, 89-90% on 12LO2 on arrival.  161/52. Desat to 82% with transfer and nurse incr pt to 15L.  Took 5 min but pt recovered O2 to 94% and was turned back to 12L on departure.    Exercises     Shoulder Instructions      Home Living Family/patient expects to be discharged to:: Private residence Living Arrangements: Spouse/significant other Available Help at Discharge: Family;Available PRN/intermittently Type of Home: House Home Access: Stairs to enter [employer] of Steps: 4 Entrance Stairs-Rails: Right Home Layout: Two level;Bed/bath upstairs Alternate Level Stairs-Number of Steps: 13 Alternate Level Stairs-Rails: Left Bathroom Shower/Tub: Occupational psychologist: Standard     Home Equipment: Cane - single point;Other (comment) (02)          Prior Functioning/Environment Level of Independence: Independent with assistive device(s);Needs assistance  Gait / Transfers Assistance Needed: used cane PTA, states he stumbles around ADL's / Homemaking Assistance Needed: sits to shower, but is independent in self care            OT Problem List: Decreased activity tolerance;Impaired balance (sitting and/or standing);Decreased knowledge of use of DME or AE      OT Treatment/Interventions: Self-care/ADL training;DME and/or AE instruction;Patient/family education;Balance training;Therapeutic activities;Energy conservation    OT Goals(Current goals can be found in  the care plan section) Acute Rehab OT Goals Patient Stated Goal: to go home OT Goal Formulation: With patient Time For Goal Achievement: 04/18/20 Potential to Achieve Goals: Good ADL Goals Pt Will Perform Grooming: with supervision;standing (2 activities) Pt Will Perform Upper Body Bathing: with set-up;sitting Pt Will Perform Lower Body Bathing: with supervision;sit to/from stand Pt Will Perform Upper Body Dressing: with set-up;sitting Pt Will Perform Lower Body Dressing: with supervision;sit to/from stand Pt Will Transfer to Toilet: with supervision;ambulating;bedside commode Pt Will Perform Toileting - Clothing Manipulation and hygiene: with supervision;sit to/from stand Additional ADL Goal #1: Pt will generalize energy conservation strategies during ADL and mobility.  OT Frequency: Min 2X/week   Barriers to D/C:            Co-evaluation              AM-PAC OT "6 Clicks" Daily Activity     Outcome Measure Help from another person eating meals?: None Help from another person taking care of personal grooming?: A Little Help from another person toileting, which includes using toliet, bedpan, or urinal?: A Little Help from another person bathing (including washing, rinsing, drying)?: A Little Help from another person to put on and taking off regular  upper body clothing?: A Little Help from another person to put on and taking off regular lower body clothing?: A Little 6 Click Score: 19   End of Session Equipment Utilized During Treatment: Rolling walker;Oxygen (12-15L) Nurse Communication: Mobility status  Activity Tolerance: Treatment limited secondary to medical complications (Comment) (desats with minimal exertion) Patient left: in chair;with call bell/phone within reach;with chair alarm set  OT Visit Diagnosis: Unsteadiness on feet (R26.81);Other abnormalities of gait and mobility (R26.89);Muscle weakness (generalized) (M62.81)                Time: 1324-4010 OT Time  Calculation (min): 20 min Charges:  OT General Charges $OT Visit: 1 Visit OT Evaluation $OT Eval Moderate Complexity: 1 Mod  Martie Round, OTR/L Acute Rehabilitation Services Pager: [phone] Office: [phone]  Evern Bio 04/04/2020, 1:43 PM

## 2020-04-05 DIAGNOSIS — D649 Anemia, unspecified: Secondary | ICD-10-CM | POA: Diagnosis not present

## 2020-04-05 LAB — BASIC METABOLIC PANEL
Anion gap: 11 (ref 5–15)
BUN: 33 mg/dL — ABNORMAL HIGH (ref 8–23)
CO2: 34 mmol/L — ABNORMAL HIGH (ref 22–32)
Calcium: 9 mg/dL (ref 8.9–10.3)
Chloride: 94 mmol/L — ABNORMAL LOW (ref 98–111)
Creatinine, Ser: 1.64 mg/dL — ABNORMAL HIGH (ref 0.61–1.24)
GFR, Estimated: 41 mL/min — ABNORMAL LOW (ref >60)
Glucose, Bld: 102 mg/dL — ABNORMAL HIGH (ref 70–99)
Potassium: 4.1 mmol/L (ref 3.5–5.1)
Sodium: 139 mmol/L (ref 135–145)

## 2020-04-05 LAB — GLUCOSE, CAPILLARY
Glucose-Capillary: 170 mg/dL — ABNORMAL HIGH (ref 70–99)
Glucose-Capillary: 113 mg/dL — ABNORMAL HIGH (ref 70–99)
Glucose-Capillary: 121 mg/dL — ABNORMAL HIGH (ref 70–99)
Glucose-Capillary: 129 mg/dL — ABNORMAL HIGH (ref 70–99)

## 2020-04-05 LAB — MAGNESIUM: Magnesium: 1.6 mg/dL — ABNORMAL LOW (ref 1.7–2.4)

## 2020-04-05 LAB — BRAIN NATRIURETIC PEPTIDE: B Natriuretic Peptide: 224.9 pg/mL — ABNORMAL HIGH (ref 0.0–100.0)

## 2020-04-05 LAB — PHOSPHORUS: Phosphorus: 2.7 mg/dL (ref 2.5–4.6)

## 2020-04-05 MED ORDER — FUROSEMIDE 10 MG/ML IJ SOLN
INTRAMUSCULAR | Status: AC
  Filled 2020-04-05: qty 4

## 2020-04-05 MED ORDER — MAGNESIUM SULFATE 4 GM/100ML IV SOLN
4.0000 g | Freq: Once | INTRAVENOUS | Status: AC
  Administered 2020-04-05: 4 g via INTRAVENOUS
  Filled 2020-04-05: qty 100

## 2020-04-05 MED ORDER — AMLODIPINE BESYLATE 10 MG PO TABS
10.0000 mg | ORAL_TABLET | Freq: Every day | ORAL | Status: DC
  Administered 2020-04-06: 10 mg via ORAL
  Filled 2020-04-05: qty 1

## 2020-04-05 MED FILL — Medication: Qty: 1 | Status: AC

## 2020-04-05 NOTE — Progress Notes (Signed)
Pt resting comfortably bipap not needed at this time.

## 2020-04-05 NOTE — Plan of Care (Signed)

## 2020-04-05 NOTE — TOC Progression Note (Addendum)
Transition of Care [facility]) - Progression Note    Patient Details  Name: Bradley Johns MRN: [id number] Date of Birth: 1934-08-28  Transition of [facility] CM/SW Contact  Lorri Frederick, LCSW Phone Number: 04/05/2020, 3:14 PM  Clinical Narrative:   CSW spoke with pt and wife (by phone).  Provided ALF list.  Discussed recommendations for [facility] and they are agreeable to this, no agency preference.  O2 confirmed with Lawson Fiscal from [place], but they are not able to provide recommended rollator or 3n1.  Rollator and 3n1 ordered through [employer].  [facility] set up for [facility].    Expected Discharge Plan: Home w Home Health Services Barriers to Discharge: Continued Medical Work up  Expected Discharge Plan and Services Expected Discharge Plan: Home w Home Health Services     Post Acute Care Choice: Home Health Living arrangements for the past 2 months: Single Family Home                                       Social Determinants of Health (SDOH) Interventions    Readmission Risk Interventions No flowsheet data found.

## 2020-04-05 NOTE — Plan of Care (Signed)

## 2020-04-05 NOTE — Progress Notes (Signed)
PROGRESS NOTE  Bradley HALLENBECK ZOX:[id number] DOB: 01-Jul-1934 DOA: 03/31/2020 PCP: Wanda Plump, MD  HPI/Recap of past 24 hours: Brief History:  85 year old male admitted 12/25 after syncopal episode and treated for anemia, then went on to have worsening hypoxia and escalating O2 up to 13L high flow. Brief PEA arrest in the early AM hours of 12/28.  Transferred to ICU, placed on BiPAP and diuresed.   History of Present Illness:  85 year old male with past medical history as below, which is significant for pulmonary fibrosis and COPD on 3 to 5 L of home oxygen followed by Dr. Vassie Loll, OSA, hypertension, diabetes mellitus, and BPH.  He suffered syncopal episode at home on 12/25 causing him to present to [facility] emergency department.  Upon arrival to the ED he was dyspneic and hemoglobin found to be 5.  Hemoccult negative.  Some vague complaints of intermittent hematuria.  He was transfused 2 units of PRBC and admitted to the hospital service.  His course then became complicated for worsening hypoxia.  VQ scan was done and PE was not felt to be likely.  On 12/27 oxygen requirement significantly increased and there was concern for pulmonary edema.  He was started on IV Lasix and Solu-Medrol.  In the early a.m. hours of 12/28 central telemetry notified his nurse of rhythm change at which time he was found to be bradycardic.  Shortly after he lost pulses to PEA arrest.  Very brief duration of CPR (less than 1 round) prior to ROSC.  Postarrest he was awake and alert.  Transferred to ICU for closer monitoring.  Past Medical History:   has a past medical history of Arthritis, Benign prostatic hypertrophy, COPD (chronic obstructive pulmonary disease) (HCC), Diabetes mellitus, Epidermal cyst of face, Gout, Hyperlipidemia, Hypertension, Pulmonary fibrosis (HCC) (2014), and Sleep apnea.  Significant Hospital Events:  12/25 admit, anemic, 2 units PRBC.  12/27 worsening O2 requirements  12/28 PEA arrest tx to  ICU. Started on BiPAP  Transferred to [facility] service on 04/05/2020.   04/05/20: Patient was seen and examined at his bedside.  Mild conversational dyspnea noted on exam.  He denies any chest pain at the time of this exam.  Currently on high flow nasal cannula 12 L.  Assessment/Plan: Principal Problem:   Symptomatic anemia Active Problems:   Diabetes mellitus type 2 in nonobese (HCC)   Hyperlipidemia   Essential hypertension   COPD with emphysema, on O2   BPH (benign prostatic hyperplasia)   Dyspnea on exertion   Pulmonary fibrosis (HCC)   Chronic respiratory failure with hypoxia (HCC)   Syncope and collapse   AKI (acute kidney injury) (HCC)   Hypoxia   Acute respiratory distress   Cardiac arrest (HCC)  Cardiac Arrest:  brief PEA arrest < 3 minutes. Likely etiology is hypoxia as patient was requiring 13L [place] and was found without nasal cannula in and component of heart failure with elevated BNP. TTE 12/26 shows normal LVEF, normal wall function and normal diastolic parameters; elevated PAP 58.4, normal RV.  NM perfusion test no evidence of acute PE; several multiple peripheral perfusion defects due to combination of COPD/ IPF - has remained neurologically intact -Continue to wean off oxygen supplementation as tolerated Currently on high flow nasal cannula 12 L, at baseline he is on 3 to 4 L nasal cannula.  Euvolemic on exam, ongoing diuresing.  Will reevaluate.  Acute on chronic hypoxemic, hypercarbic respiratory failure, likely multifactorial, secondary to pulmonary edema superimposed on known  IPF.  Was prescribed OFEV, but was nervous to be seen in office due to COVID-19 pandemic and was never started.  - continue BiPAP PRN; continue HHFNC weaning O2 for sat goal 88-94% - diurese as below; day 3x/3 - ongoing aggressive pulmonary hygiene/ IS/ PT - intermittent CXR - steroids 12/27 > 12/28   HTN:  BPs are currently soft, holding off Norvasc while on high dose of IV diuretics He is  currently on Coreg 12.5 mg twice daily and IV Lasix 80 mg twice daily.  Along with Cardura 4 mg nightly. Maintain MAP greater than 65 Continue to closely monitor vital signs  COPD: without acute exacerbation - Continue duonebs q 6hr and aggressive pulmonary hygiene - add mucinex   - no role for steroids at this time  Iron deficiency Anemia: symptomatic anemia with a hemoglobin of 5. 2 units transfused. Markedly low ferritin 11 - Follow CBC; Hgb trend stable  - Transfuse for Hgb < 7 - feraheme started 12/28; next dose 12/31 - will need further outpt GI f/u   Prior history of hematochezia post colonoscopy in 2020 Will need to follow-up with GI outpatient posthospitalization. Continue to monitor H&H, transfuse hemoglobin less than 7.0. During this admission FOBT was negative.  AKI Hypokalemia 2/2 diuresis  Hypokalemia has resolved Continue to avoid nephrotoxins Continue to trend renal indices/ strict I/Os  DM 2, well controlled Hemoglobin A1c 5.7 on 03/31/2020 - AC HS SSI- decrease to sensitive scale  Avoid hypoglycemia  Pulmonary nodule - outpatient follow-up in our pulmonary clinic  Chronic pain: - minimize sedating meds as able given hypercarbic resp failure.  Currently no complaints of pain.  Best practice (evaluated daily)  Diet: Heart heathy Pain/Anxiety/Delirium protocol (if indicated): NA VAP protocol (if indicated): NA DVT prophylaxis: Lovenox GI prophylaxis: PPI Glucose control: SSI Mobility: BR Disposition:ICU-> PCU      Code Status: Full code  Family Communication: None at bedside.  Disposition Plan: Likely will DC to home with home health services.   Consultants:  None.  Procedures:  None.  Antimicrobials:  None.  DVT prophylaxis: Subcu Lovenox daily  Status is: Inpatient    Dispo:  Patient From: Home  Planned Disposition: Home with Health Care Svc  Expected discharge date: 04/07/2020  Medically stable for discharge: No,  ongoing management of acute on chronic hypoxic hypercarbic respiratory failure.         Objective: Vitals:   04/05/20 0058 04/05/20 0428 04/05/20 0804 04/05/20 1213  BP: (!) 152/60 (!) 138/49 (!) 121/47 (!) 132/57  Pulse: (!) 51 65 70 (!) 55  Resp: 20 (!) 21 15 15   Temp: 98.4 F (36.9 C) 98.3 F (36.8 C) 98.4 F (36.9 C) 98.1 F (36.7 C)  TempSrc: Oral Oral    SpO2: 95% 92% 94% 96%  Weight:      Height:        Intake/Output Summary (Last 24 hours) at 04/05/2020 1514 Last data filed at 04/05/2020 0818 Gross per 24 hour  Intake 240 ml  Output 2800 ml  Net -2560 ml   Filed Weights   04/02/20 0447 04/03/20 0600 04/04/20 0400  Weight: 81.2 kg 80.6 kg 80.6 kg    Exam:  . General: 85 y.o. year-old male well developed well nourished in no acute distress.  Alert and oriented x3. . Cardiovascular: Regular rate and rhythm with no rubs or gallops.  No thyromegaly or JVD noted.   Marland Kitchen Respiratory: Mild rales at bases with no wheezing noted.  Poor inspiratory effort.   Marland Kitchen  Abdomen: Soft nontender nondistended with normal bowel sounds x4 quadrants. . Musculoskeletal: No lower extremity edema. 2/4 pulses in all 4 extremities. Marland Kitchen Psychiatry: Mood is appropriate for condition and setting   Data Reviewed: CBC: Recent Labs  Lab 03/31/20 0708 03/31/20 0759 04/01/20 0509 04/01/20 1649 04/02/20 0207 04/02/20 1658 04/03/20 0455  WBC 6.1   < > 8.3 7.5 8.1 6.3 5.8  NEUTROABS 4.8  --   --  5.9 5.9 5.5 4.9  HGB 5.0*   < > 8.0* 7.6* 7.1* 7.4* 7.6*  HCT 19.8*   < > 26.1* 26.0* 24.2* 24.4* 26.7*  MCV 88.0   < > 82.1 83.3 83.2 82.4 84.2  PLT 213   < > 218 195 186 200 191   < > = values in this interval not displayed.   Basic Metabolic Panel: Recent Labs  Lab 04/01/20 0509 04/02/20 0207 04/03/20 0455 04/04/20 0047 04/05/20 0238  NA 139 138 138 141 139  K 4.2 4.3 4.6 3.5 4.1  CL 106 104 101 99 94*  CO2 26 26 27 31  34*  GLUCOSE 96 122* 146* 110* 102*  BUN 31* 25* 28* 27* 33*   CREATININE 1.66* 1.78* 1.55* 1.43* 1.64*  CALCIUM 8.7* 8.6* 8.6* 8.8* 9.0  MG  --  1.6* 2.2  --  1.6*  PHOS  --   --   --   --  2.7   GFR: Estimated Creatinine Clearance: 33 mL/min (A) (by C-G formula based on SCr of 1.64 mg/dL (H)). Liver Function Tests: Recent Labs  Lab 03/31/20 0708 04/02/20 0207 04/03/20 0455  AST 30 27 22   ALT 18 21 19   ALKPHOS 47 64 59  BILITOT 0.8 0.9 0.9  PROT 5.6* 6.2* 6.2*  ALBUMIN 2.7* 2.8* 2.7*   No results for input(s): LIPASE, AMYLASE in the last 168 hours. No results for input(s): AMMONIA in the last 168 hours. Coagulation Profile: No results for input(s): INR, PROTIME in the last 168 hours. Cardiac Enzymes: No results for input(s): CKTOTAL, CKMB, CKMBINDEX, TROPONINI in the last 168 hours. BNP (last 3 results) No results for input(s): PROBNP in the last 8760 hours. HbA1C: No results for input(s): HGBA1C in the last 72 hours. CBG: Recent Labs  Lab 04/04/20 1110 04/04/20 1617 04/04/20 2226 04/05/20 0750 04/05/20 1209  GLUCAP 187* 132* 134* 170* 113*   Lipid Profile: No results for input(s): CHOL, HDL, LDLCALC, TRIG, CHOLHDL, LDLDIRECT in the last 72 hours. Thyroid Function Tests: No results for input(s): TSH, T4TOTAL, FREET4, T3FREE, THYROIDAB in the last 72 hours. Anemia Panel: No results for input(s): VITAMINB12, FOLATE, FERRITIN, TIBC, IRON, RETICCTPCT in the last 72 hours. Urine analysis:    Component Value Date/Time   COLORURINE STRAW (A) 04/01/2020 0541   APPEARANCEUR CLEAR 04/01/2020 0541   LABSPEC 1.008 04/01/2020 0541   PHURINE 5.0 04/01/2020 0541   GLUCOSEU NEGATIVE 04/01/2020 0541   GLUCOSEU NEGATIVE 10/16/2014 1203   HGBUR SMALL (A) 04/01/2020 0541   HGBUR negative 10/06/2006 0756   BILIRUBINUR NEGATIVE 04/01/2020 0541   BILIRUBINUR negative 09/09/2017 1217   KETONESUR NEGATIVE 04/01/2020 0541   PROTEINUR NEGATIVE 04/01/2020 0541   UROBILINOGEN 0.2 09/09/2017 1217   UROBILINOGEN 1.0 10/16/2014 1203   NITRITE  NEGATIVE 04/01/2020 0541   LEUKOCYTESUR NEGATIVE 04/01/2020 0541   Sepsis Labs: @LABRCNTIP (procalcitonin:4,lacticidven:4)  ) Recent Results (from the past 240 hour(s))  Resp Panel by RT-PCR (Flu A&B, Covid) Nasopharyngeal Swab     Status: None   Collection Time: 03/31/20  7:05 AM  Specimen: Nasopharyngeal Swab; Nasopharyngeal(NP) swabs in vial transport medium  Result Value Ref Range Status   SARS Coronavirus 2 by RT PCR NEGATIVE NEGATIVE Final    Comment: (NOTE) SARS-CoV-2 target nucleic acids are NOT DETECTED.  The SARS-CoV-2 RNA is generally detectable in upper respiratory specimens during the acute phase of infection. The lowest concentration of SARS-CoV-2 viral copies this assay can detect is 138 copies/mL. A negative result does not preclude SARS-Cov-2 infection and should not be used as the sole basis for treatment or other patient management decisions. A negative result may occur with  improper specimen collection/handling, submission of specimen other than nasopharyngeal swab, presence of viral mutation(s) within the areas targeted by this assay, and inadequate number of viral copies(<138 copies/mL). A negative result must be combined with clinical observations, patient history, and epidemiological information. The expected result is Negative.  Fact Sheet for Patients:  EntrepreneurPulse.com.au  Fact Sheet for Healthcare Providers:  IncredibleEmployment.be  This test is no t yet approved or cleared by the [place] FDA and  has been authorized for detection and/or diagnosis of SARS-CoV-2 by FDA under an Emergency Use Authorization (EUA). This EUA will remain  in effect (meaning this test can be used) for the duration of the COVID-19 declaration under Section 564(b)(1) of the Act, 21 U.S.C.section 360bbb-3(b)(1), unless the authorization is terminated  or revoked sooner.       Influenza A by PCR NEGATIVE NEGATIVE Final    Influenza B by PCR NEGATIVE NEGATIVE Final    Comment: (NOTE) The Xpert Xpress SARS-CoV-2/FLU/RSV plus assay is intended as an aid in the diagnosis of influenza from Nasopharyngeal swab specimens and should not be used as a sole basis for treatment. Nasal washings and aspirates are unacceptable for Xpert Xpress SARS-CoV-2/FLU/RSV testing.  Fact Sheet for Patients: EntrepreneurPulse.com.au  Fact Sheet for Healthcare Providers: IncredibleEmployment.be  This test is not yet approved or cleared by the [place] FDA and has been authorized for detection and/or diagnosis of SARS-CoV-2 by FDA under an Emergency Use Authorization (EUA). This EUA will remain in effect (meaning this test can be used) for the duration of the COVID-19 declaration under Section 564(b)(1) of the Act, 21 U.S.C. section 360bbb-3(b)(1), unless the authorization is terminated or revoked.  Performed at [facility], [place] [address] [place], [place] [address]   MRSA PCR Screening     Status: None   Collection Time: 04/03/20  7:07 AM   Specimen: Nasopharyngeal  Result Value Ref Range Status   MRSA by PCR NEGATIVE NEGATIVE Final    Comment:        The GeneXpert MRSA Assay (FDA approved for NASAL specimens only), is one component of a comprehensive MRSA colonization surveillance program. It is not intended to diagnose MRSA infection nor to guide or monitor treatment for MRSA infections. Performed at [facility], [place] [address], [place], [place] [address]       Studies: No results found.  Scheduled Meds: . [START ON 04/06/2020] amLODipine  10 mg Oral Daily  . aspirin EC  81 mg Oral Daily  . atorvastatin  80 mg Oral QPM  . carvedilol  12.5 mg Oral BID WC  . Chlorhexidine Gluconate Cloth  6 each Topical Daily  . doxazosin  4 mg Oral QHS  . enoxaparin (LOVENOX) injection  40 mg Subcutaneous Q24H  . furosemide  80 mg Intravenous Q12H  . guaiFENesin  600  mg Oral BID  . insulin aspart  0-5 Units Subcutaneous QHS  .  insulin aspart  0-9 Units Subcutaneous TID WC  . mouth rinse  15 mL Mouth Rinse BID  . sodium chloride flush  3 mL Intravenous Q12H    Continuous Infusions: . sodium chloride    . [START ON 04/06/2020] ferumoxytol       LOS: 5 days     Kayleen Memos, MD [facility] Pager [phone]  If 7PM-7AM, please contact night-coverage [email or web address] Password [email or web address] 04/05/2020, 3:14 PM

## 2020-04-05 NOTE — Care Management Important Message (Signed)
Important Message  Patient Details  Name: Bradley Johns MRN: [id number] Date of Birth: 1934/08/01   Medicare Important Message Given:  Yes     [name] Stefan Church 04/05/2020, 3:57 PM

## 2020-04-06 DIAGNOSIS — D649 Anemia, unspecified: Secondary | ICD-10-CM | POA: Diagnosis not present

## 2020-04-06 LAB — BASIC METABOLIC PANEL
Anion gap: 14 (ref 5–15)
BUN: 33 mg/dL — ABNORMAL HIGH (ref 8–23)
CO2: 33 mmol/L — ABNORMAL HIGH (ref 22–32)
Calcium: 8.8 mg/dL — ABNORMAL LOW (ref 8.9–10.3)
Chloride: 91 mmol/L — ABNORMAL LOW (ref 98–111)
Creatinine, Ser: 1.72 mg/dL — ABNORMAL HIGH (ref 0.61–1.24)
GFR, Estimated: 39 mL/min — ABNORMAL LOW (ref >60)
Glucose, Bld: 135 mg/dL — ABNORMAL HIGH (ref 70–99)
Potassium: 3.9 mmol/L (ref 3.5–5.1)
Sodium: 138 mmol/L (ref 135–145)

## 2020-04-06 LAB — GLUCOSE, CAPILLARY
Glucose-Capillary: 86 mg/dL (ref 70–99)
Glucose-Capillary: 250 mg/dL — ABNORMAL HIGH (ref 70–99)
Glucose-Capillary: 159 mg/dL — ABNORMAL HIGH (ref 70–99)
Glucose-Capillary: 125 mg/dL — ABNORMAL HIGH (ref 70–99)

## 2020-04-06 LAB — BRAIN NATRIURETIC PEPTIDE: B Natriuretic Peptide: 86.4 pg/mL (ref 0.0–100.0)

## 2020-04-06 MED ORDER — SENNA 8.6 MG PO TABS
2.0000 | ORAL_TABLET | Freq: Every day | ORAL | Status: DC
  Administered 2020-04-06 – 2020-04-13 (×7): 17.2 mg via ORAL
  Filled 2020-04-06 (×8): qty 2

## 2020-04-06 MED ORDER — CARVEDILOL 3.125 MG PO TABS
3.1250 mg | ORAL_TABLET | Freq: Two times a day (BID) | ORAL | Status: DC
  Administered 2020-04-06 – 2020-04-13 (×8): 3.125 mg via ORAL
  Filled 2020-04-06 (×15): qty 1

## 2020-04-06 NOTE — Progress Notes (Signed)
Physical Therapy Treatment Patient Details Name: Bradley Johns MRN: [id number] DOB: 01-06-1935 Today's Date: 04/06/2020    History of Present Illness Bradley Johns is a 85 y.o. admitted with syncope. Pt with age indeterminate nasal fx. Worsening hypoxemia 12/27. 12/28 with PEA arrest and transfer to ICU on bipap. PMHx:OSA; HTN; HLD; DM; pulmonary fibrosis/COPD on home O2; and BPH    PT Comments    Pt very pleasant and wanting to get to the bathroom to urinate. Pt able to walk to bathroom and back but required 15L to do so with sats 87%. Pt educated for current oxygen demand limiting activity and safety. Pt educated for HEP and encouraged to continue OOB for meals and assisted mobility acutely. Pt at rest on 7L with SpO2 92%. Pt required 1 min seated rest between ambulation and HEP to recover to 95% with gradual titration of O2 back to 7L.     Follow Up Recommendations  Home health PT;Supervision/Assistance - 24 hour     Equipment Recommendations  3in1 (PT);Other (comment) (rollator)    Recommendations for Other Services       Precautions / Restrictions Precautions Precautions: Fall Precaution Comments: watch sats (required 15L for limited gait this session)    Mobility  Bed Mobility Overal bed mobility: Modified Independent             General bed mobility comments: HOB flat  Transfers Overall transfer level: Modified independent                  Ambulation/Gait Ambulation/Gait assistance: Min guard Gait Distance (Feet): 24 Feet Assistive device: Rolling walker (2 wheeled) Gait Pattern/deviations: Step-through pattern;Decreased stride length   Gait velocity interpretation: <1.8 ft/sec, indicate of risk for recurrent falls General Gait Details: pt able to walk 12' to bathroom and back with rW with standing rest. Attempted initial bout on 8L with drop to 80% SpO2 and return on 15L with SpO2 87% with cues for breathing technique, safety and RW  use   Stairs             Wheelchair Mobility    Modified Rankin (Stroke Patients Only)       Balance Overall balance assessment: Needs assistance   Sitting balance-Leahy Scale: Good     Standing balance support: Bilateral upper extremity supported;During functional activity Standing balance-Leahy Scale: Poor Standing balance comment: RW for standing and gait                            Cognition Arousal/Alertness: Awake/alert Behavior During Therapy: WFL for tasks assessed/performed Overall Cognitive Status: Impaired/Different from baseline Area of Impairment: Orientation                 Orientation Level: Time                    Exercises General Exercises - Lower Extremity Long Arc Quad: AROM;Both;Seated;20 reps Hip Flexion/Marching: AROM;Both;Seated;10 reps    General Comments        Pertinent Vitals/Pain Pain Assessment: No/denies pain    Home Living                      Prior Function            PT Goals (current goals can now be found in the care plan section) Progress towards PT goals: Progressing toward goals    Frequency    Min 3X/week  PT Plan Current plan remains appropriate    Co-evaluation              AM-PAC PT "6 Clicks" Mobility   Outcome Measure  Help needed turning from your back to your side while in a flat bed without using bedrails?: None Help needed moving from lying on your back to sitting on the side of a flat bed without using bedrails?: None Help needed moving to and from a bed to a chair (including a wheelchair)?: None Help needed standing up from a chair using your arms (e.g., wheelchair or bedside chair)?: None Help needed to walk in hospital room?: A Little Help needed climbing 3-5 steps with a railing? : A Lot 6 Click Score: 21    End of Session Equipment Utilized During Treatment: Oxygen Activity Tolerance: Patient tolerated treatment well Patient left: in  chair;with call bell/phone within reach Nurse Communication: Mobility status PT Visit Diagnosis: Unsteadiness on feet (R26.81);Muscle weakness (generalized) (M62.81)     Time: [id number] PT Time Calculation (min) (ACUTE ONLY): 25 min  Charges:  $Gait Training: 8-22 mins $Therapeutic Exercise: 8-22 mins                     [place], PT Acute Rehabilitation Services Pager: [phone] Office: [place] 04/06/2020, 9:41 AM

## 2020-04-06 NOTE — Progress Notes (Signed)
PROGRESS NOTE  Bradley Johns [id number] DOB: 01-31-1935 DOA: 03/31/2020 PCP: Colon Branch, MD  HPI/Recap of past 24 hours: Brief History:  85 year old male admitted 12/25 after syncopal episode and treated for anemia, then went on to have worsening hypoxia and escalating O2 up to 13L high flow. Brief PEA arrest in the early AM hours of 12/28.  Transferred to ICU, placed on BiPAP and diuresed.   History of Present Illness:  85 year old male with past medical history as below, which is significant for pulmonary fibrosis and COPD on 3 to 5 L of home oxygen followed by Dr. Elsworth Soho, OSA, hypertension, diabetes mellitus, and BPH.  He suffered syncopal episode at home on 12/25 causing him to present to [facility] emergency department.  Upon arrival to the ED he was dyspneic and hemoglobin found to be 5.  Hemoccult negative.  Some vague complaints of intermittent hematuria.  He was transfused 2 units of PRBC and admitted to the hospital service.  His course then became complicated for worsening hypoxia.  VQ scan was done and PE was not felt to be likely.  On 12/27 oxygen requirement significantly increased and there was concern for pulmonary edema.  He was started on IV Lasix and Solu-Medrol.  In the early a.m. hours of 12/28 central telemetry notified his nurse of rhythm change at which time he was found to be bradycardic.  Shortly after he lost pulses to PEA arrest.  Very brief duration of CPR (less than 1 round) prior to ROSC.  Postarrest he was awake and alert.  Transferred to ICU for closer monitoring.  Past Medical History:   has a past medical history of Arthritis, Benign prostatic hypertrophy, COPD (chronic obstructive pulmonary disease) ([place]), Diabetes mellitus, Epidermal cyst of face, Gout, Hyperlipidemia, Hypertension, Pulmonary fibrosis ([place]) (2014), and Sleep apnea.  Significant Hospital Events:  12/25 admit, anemic, 2 units PRBC.  12/27 worsening O2 requirements  12/28 PEA arrest tx to  ICU. Started on BiPAP  Transferred to [facility] service on 04/05/2020.   04/06/20: Patient was seen and examined at his bedside.  Dyspnea with ambulation.  O2 saturation dropped with ambulation requiring 15 L while ambulating with O2 saturation of 87%.  At rest he is requiring 7 L with O2 saturation of 92%.  Very deconditioned.  Will need to continue PT OT with assistance and fall precautions.  Out of bed to chair with every shift.  Assessment/Plan: Principal Problem:   Symptomatic anemia Active Problems:   Diabetes mellitus type 2 in nonobese (HCC)   Hyperlipidemia   Essential hypertension   COPD with emphysema, on O2   BPH (benign prostatic hyperplasia)   Dyspnea on exertion   Pulmonary fibrosis (HCC)   Chronic respiratory failure with hypoxia (HCC)   Syncope and collapse   AKI (acute kidney injury) ([place])   Hypoxia   Acute respiratory distress   Cardiac arrest (HCC)  Cardiac Arrest:  brief PEA arrest < 3 minutes. Likely etiology is hypoxia as patient was requiring 13L [place] and was found without nasal cannula in and component of heart failure with elevated BNP. TTE 12/26 shows normal LVEF, normal wall function and normal diastolic parameters; elevated PAP 58.4, normal RV.  NM perfusion test no evidence of acute PE; several multiple peripheral perfusion defects due to combination of COPD/ IPF - has remained neurologically intact Continue to wean off oxygen supplementation as tolerated.  Continue PT OT Currently on high flow nasal cannula 7L, at baseline he is on  3 to 4 L nasal cannula.   He is euvolemic on exam, IV Lasix discontinued on 04/06/2020. No chest pain reported.  Persistent acute on chronic hypoxemic, hypercarbic respiratory failure, likely multifactorial, secondary to pulmonary edema superimposed on known IPF.   Was prescribed OFEV, but was nervous to be seen in office due to COVID-19 pandemic and was never started.  - continue BiPAP PRN; continue HHFNC weaning O2 for sat goal  88-94% - diurese as below; day 3x/3 - ongoing aggressive pulmonary hygiene/ IS/ PT - intermittent CXR - steroids 12/27 > 12/28   HTN, currently hypotensive with BP 102/40 with a MAP of 58:  Hold off Norvasc for now and restart if indicated. Continue beta-blocker at lower dose to avoid beta-blocker withdrawal. Continue to closely monitor vital signs  COPD: without acute exacerbation - Continue duonebs q 6hr and aggressive pulmonary hygiene - add mucinex   - no role for steroids at this time per PCCM.  Iron deficiency Anemia: symptomatic anemia with a hemoglobin of 5. 2 units transfused. Markedly low ferritin 11 - Follow CBC; Hgb trend stable  - Transfuse for Hgb < 7 - feraheme started 12/28; next dose 12/31, completed. - will need further outpt GI f/u  Obtain CBC in the morning.  Prior history of hematochezia post colonoscopy in 2020 Will need to follow-up with GI outpatient posthospitalization. Continue to monitor H&H, transfuse hemoglobin less than 7.0. During this admission FOBT was negative.  Worsening AKI on CKD 3B likely prerenal in the setting of high doses of diuretics Baseline creatinine appears to be 1.4 with GFR of 49 Creatinine uptrending 1.7 with GFR 39. Continue to avoid nephrotoxins Diuretics have been discontinued. Obtain renal panel in the morning.  Pulmonary edema/volume overload Resolved Currently euvolemic Stop IV diuretics on 04/06/2020  Resolved hypokalemia post repletion. Monitor, replete and recheck as needed.  DM 2, well controlled Hemoglobin A1c 5.7 on 03/31/2020 - AC HS SSI- decrease to sensitive scale  Avoid hypoglycemia  Pulmonary nodule - outpatient follow-up in our pulmonary clinic  Chronic pain: - minimize sedating meds as able given hypercarbic resp failure.  Currently no complaints of pain.  Best practice (evaluated daily)  Diet: Heart heathy Pain/Anxiety/Delirium protocol (if indicated): NA VAP protocol (if indicated):  NA DVT prophylaxis: Lovenox GI prophylaxis: PPI Glucose control: SSI Mobility: BR Disposition:ICU-> PCU      Code Status: Full code  Family Communication: None at bedside.  Disposition Plan: Likely will DC to home with home health services once oxygen requirement with ambulation is less than 6 L..   Consultants:  None.  Procedures:  None.  Antimicrobials:  None.  DVT prophylaxis: Subcu Lovenox daily  Status is: Inpatient    Dispo:  Patient From: Home  Planned Disposition: Home with Health Care Svc  Expected discharge date: 04/09/2020  Medically stable for discharge: No, ongoing management of acute on chronic hypoxic hypercarbic respiratory failure.  He is currently requiring 15 L to maintain his O2 saturation greater than 87% with ambulation.         Objective: Vitals:   04/06/20 0446 04/06/20 0731 04/06/20 0938 04/06/20 1201  BP: (!) 150/48 (!) 167/49  (!) 102/40  Pulse: 63 (!) 59  64  Resp: 16 18  18   Temp: 97.6 F (36.4 C) 98.5 F (36.9 C)  97.9 F (36.6 C)  TempSrc: Oral     SpO2: 96% 98% (!) 87% 96%  Weight:      Height:        Intake/Output  Summary (Last 24 hours) at 04/06/2020 1350 Last data filed at 04/06/2020 0448 Gross per 24 hour  Intake 245 ml  Output 1850 ml  Net -1605 ml   Filed Weights   04/03/20 0600 04/04/20 0400 04/06/20 0347  Weight: 80.6 kg 80.6 kg 76.3 kg    Exam:  . General: 85 y.o. year-old male well-developed well-nourished no acute stress.  Alert and oriented x3.   . Cardiovascular: Regular rate and rhythm no rubs or gallops. Marland Kitchen Respiratory: Mild rales at bases no wheezing noted.  Poor inspiratory effort.   . Abdomen: Soft with normal bowel sounds present.   . Musculoskeletal: No lower extremity edema bilaterally.   Marland Kitchen Psychiatry: Mood is appropriate for condition and setting.   Data Reviewed: CBC: Recent Labs  Lab 03/31/20 0708 03/31/20 0759 04/01/20 0509 04/01/20 1649 04/02/20 0207 04/02/20 1658  04/03/20 0455  WBC 6.1   < > 8.3 7.5 8.1 6.3 5.8  NEUTROABS 4.8  --   --  5.9 5.9 5.5 4.9  HGB 5.0*   < > 8.0* 7.6* 7.1* 7.4* 7.6*  HCT 19.8*   < > 26.1* 26.0* 24.2* 24.4* 26.7*  MCV 88.0   < > 82.1 83.3 83.2 82.4 84.2  PLT 213   < > 218 195 186 200 191   < > = values in this interval not displayed.   Basic Metabolic Panel: Recent Labs  Lab 04/02/20 0207 04/03/20 0455 04/04/20 0047 04/05/20 0238 04/06/20 0146  NA 138 138 141 139 138  K 4.3 4.6 3.5 4.1 3.9  CL 104 101 99 94* 91*  CO2 26 27 31  34* 33*  GLUCOSE 122* 146* 110* 102* 135*  BUN 25* 28* 27* 33* 33*  CREATININE 1.78* 1.55* 1.43* 1.64* 1.72*  CALCIUM 8.6* 8.6* 8.8* 9.0 8.8*  MG 1.6* 2.2  --  1.6*  --   PHOS  --   --   --  2.7  --    GFR: Estimated Creatinine Clearance: 31.5 mL/min (A) (by C-G formula based on SCr of 1.72 mg/dL (H)). Liver Function Tests: Recent Labs  Lab 03/31/20 0708 04/02/20 0207 04/03/20 0455  AST 30 27 22   ALT 18 21 19   ALKPHOS 47 64 59  BILITOT 0.8 0.9 0.9  PROT 5.6* 6.2* 6.2*  ALBUMIN 2.7* 2.8* 2.7*   No results for input(s): LIPASE, AMYLASE in the last 168 hours. No results for input(s): AMMONIA in the last 168 hours. Coagulation Profile: No results for input(s): INR, PROTIME in the last 168 hours. Cardiac Enzymes: No results for input(s): CKTOTAL, CKMB, CKMBINDEX, TROPONINI in the last 168 hours. BNP (last 3 results) No results for input(s): PROBNP in the last 8760 hours. HbA1C: No results for input(s): HGBA1C in the last 72 hours. CBG: Recent Labs  Lab 04/05/20 1209 04/05/20 1808 04/05/20 2041 04/06/20 0731 04/06/20 1200  GLUCAP 113* 121* 129* 86 250*   Lipid Profile: No results for input(s): CHOL, HDL, LDLCALC, TRIG, CHOLHDL, LDLDIRECT in the last 72 hours. Thyroid Function Tests: No results for input(s): TSH, T4TOTAL, FREET4, T3FREE, THYROIDAB in the last 72 hours. Anemia Panel: No results for input(s): VITAMINB12, FOLATE, FERRITIN, TIBC, IRON, RETICCTPCT in the  last 72 hours. Urine analysis:    Component Value Date/Time   COLORURINE STRAW (A) 04/01/2020 0541   APPEARANCEUR CLEAR 04/01/2020 0541   LABSPEC 1.008 04/01/2020 0541   PHURINE 5.0 04/01/2020 0541   GLUCOSEU NEGATIVE 04/01/2020 0541   GLUCOSEU NEGATIVE 10/16/2014 1203   HGBUR SMALL (A) 04/01/2020 0541  HGBUR negative 10/06/2006 0756   BILIRUBINUR NEGATIVE 04/01/2020 0541   BILIRUBINUR negative 09/09/2017 1217   KETONESUR NEGATIVE 04/01/2020 0541   PROTEINUR NEGATIVE 04/01/2020 0541   UROBILINOGEN 0.2 09/09/2017 1217   UROBILINOGEN 1.0 10/16/2014 1203   NITRITE NEGATIVE 04/01/2020 0541   LEUKOCYTESUR NEGATIVE 04/01/2020 0541   Sepsis Labs: @LABRCNTIP (procalcitonin:4,lacticidven:4)  ) Recent Results (from the past 240 hour(s))  Resp Panel by RT-PCR (Flu A&B, Covid) Nasopharyngeal Swab     Status: None   Collection Time: 03/31/20  7:05 AM   Specimen: Nasopharyngeal Swab; Nasopharyngeal(NP) swabs in vial transport medium  Result Value Ref Range Status   SARS Coronavirus 2 by RT PCR NEGATIVE NEGATIVE Final    Comment: (NOTE) SARS-CoV-2 target nucleic acids are NOT DETECTED.  The SARS-CoV-2 RNA is generally detectable in upper respiratory specimens during the acute phase of infection. The lowest concentration of SARS-CoV-2 viral copies this assay can detect is 138 copies/mL. A negative result does not preclude SARS-Cov-2 infection and should not be used as the sole basis for treatment or other patient management decisions. A negative result may occur with  improper specimen collection/handling, submission of specimen other than nasopharyngeal swab, presence of viral mutation(s) within the areas targeted by this assay, and inadequate number of viral copies(<138 copies/mL). A negative result must be combined with clinical observations, patient history, and epidemiological information. The expected result is Negative.  Fact Sheet for Patients:   EntrepreneurPulse.com.au  Fact Sheet for Healthcare Providers:  IncredibleEmployment.be  This test is no t yet approved or cleared by the [place] FDA and  has been authorized for detection and/or diagnosis of SARS-CoV-2 by FDA under an Emergency Use Authorization (EUA). This EUA will remain  in effect (meaning this test can be used) for the duration of the COVID-19 declaration under Section 564(b)(1) of the Act, 21 U.S.C.section 360bbb-3(b)(1), unless the authorization is terminated  or revoked sooner.       Influenza A by PCR NEGATIVE NEGATIVE Final   Influenza B by PCR NEGATIVE NEGATIVE Final    Comment: (NOTE) The Xpert Xpress SARS-CoV-2/FLU/RSV plus assay is intended as an aid in the diagnosis of influenza from Nasopharyngeal swab specimens and should not be used as a sole basis for treatment. Nasal washings and aspirates are unacceptable for Xpert Xpress SARS-CoV-2/FLU/RSV testing.  Fact Sheet for Patients: EntrepreneurPulse.com.au  Fact Sheet for Healthcare Providers: IncredibleEmployment.be  This test is not yet approved or cleared by the [place] FDA and has been authorized for detection and/or diagnosis of SARS-CoV-2 by FDA under an Emergency Use Authorization (EUA). This EUA will remain in effect (meaning this test can be used) for the duration of the COVID-19 declaration under Section 564(b)(1) of the Act, 21 U.S.C. section 360bbb-3(b)(1), unless the authorization is terminated or revoked.  Performed at [facility], [place] [address], [place], [place] [address]   MRSA PCR Screening     Status: None   Collection Time: 04/03/20  7:07 AM   Specimen: Nasopharyngeal  Result Value Ref Range Status   MRSA by PCR NEGATIVE NEGATIVE Final    Comment:        The GeneXpert MRSA Assay (FDA approved for NASAL specimens only), is one component of a comprehensive MRSA  colonization surveillance program. It is not intended to diagnose MRSA infection nor to guide or monitor treatment for MRSA infections. Performed at [facility], [place] [address], [place], [place] [address]       Studies: No results found.  Scheduled Meds: . amLODipine  10 mg Oral Daily  . aspirin EC  81 mg Oral Daily  . atorvastatin  80 mg Oral QPM  . carvedilol  12.5 mg Oral BID WC  . Chlorhexidine Gluconate Cloth  6 each Topical Daily  . doxazosin  4 mg Oral QHS  . enoxaparin (LOVENOX) injection  40 mg Subcutaneous Q24H  . guaiFENesin  600 mg Oral BID  . insulin aspart  0-5 Units Subcutaneous QHS  . insulin aspart  0-9 Units Subcutaneous TID WC  . mouth rinse  15 mL Mouth Rinse BID  . senna  2 tablet Oral Daily  . sodium chloride flush  3 mL Intravenous Q12H    Continuous Infusions:    LOS: 6 days     Kayleen Memos, MD [facility] Pager [phone]  If 7PM-7AM, please contact night-coverage [email or web address] Password [email or web address] 04/06/2020, 1:50 PM

## 2020-04-07 DIAGNOSIS — D649 Anemia, unspecified: Secondary | ICD-10-CM | POA: Diagnosis not present

## 2020-04-07 LAB — CBC WITH DIFFERENTIAL/PLATELET
Abs Immature Granulocytes: 0.15 10*3/uL — ABNORMAL HIGH (ref 0.00–0.07)
Basophils Absolute: 0 10*3/uL (ref 0.0–0.1)
Basophils Relative: 0 %
Eosinophils Absolute: 0.4 10*3/uL (ref 0.0–0.5)
Eosinophils Relative: 4 %
HCT: 27.8 % — ABNORMAL LOW (ref 39.0–52.0)
Hemoglobin: 8.3 g/dL — ABNORMAL LOW (ref 13.0–17.0)
Immature Granulocytes: 2 %
Lymphocytes Relative: 18 %
Lymphs Abs: 1.7 10*3/uL (ref 0.7–4.0)
MCH: 25.3 pg — ABNORMAL LOW (ref 26.0–34.0)
MCHC: 29.9 g/dL — ABNORMAL LOW (ref 30.0–36.0)
MCV: 84.8 fL (ref 80.0–100.0)
Monocytes Absolute: 1 10*3/uL (ref 0.1–1.0)
Monocytes Relative: 11 %
Neutro Abs: 6 10*3/uL (ref 1.7–7.7)
Neutrophils Relative %: 65 %
Platelets: 233 10*3/uL (ref 150–400)
RBC: 3.28 MIL/uL — ABNORMAL LOW (ref 4.22–5.81)
RDW: 18.9 % — ABNORMAL HIGH (ref 11.5–15.5)
WBC: 9.2 10*3/uL (ref 4.0–10.5)
nRBC: 0.7 % — ABNORMAL HIGH (ref 0.0–0.2)

## 2020-04-07 LAB — RENAL FUNCTION PANEL
Albumin: 2.6 g/dL — ABNORMAL LOW (ref 3.5–5.0)
Anion gap: 11 (ref 5–15)
BUN: 47 mg/dL — ABNORMAL HIGH (ref 8–23)
CO2: 34 mmol/L — ABNORMAL HIGH (ref 22–32)
Calcium: 8.6 mg/dL — ABNORMAL LOW (ref 8.9–10.3)
Chloride: 92 mmol/L — ABNORMAL LOW (ref 98–111)
Creatinine, Ser: 2.28 mg/dL — ABNORMAL HIGH (ref 0.61–1.24)
GFR, Estimated: 28 mL/min — ABNORMAL LOW (ref >60)
Glucose, Bld: 112 mg/dL — ABNORMAL HIGH (ref 70–99)
Phosphorus: 4.5 mg/dL (ref 2.5–4.6)
Potassium: 4.2 mmol/L (ref 3.5–5.1)
Sodium: 137 mmol/L (ref 135–145)

## 2020-04-07 LAB — GLUCOSE, CAPILLARY
Glucose-Capillary: 125 mg/dL — ABNORMAL HIGH (ref 70–99)
Glucose-Capillary: 130 mg/dL — ABNORMAL HIGH (ref 70–99)
Glucose-Capillary: 121 mg/dL — ABNORMAL HIGH (ref 70–99)
Glucose-Capillary: 144 mg/dL — ABNORMAL HIGH (ref 70–99)

## 2020-04-07 MED ORDER — HEPARIN SODIUM (PORCINE) 5000 UNIT/ML IJ SOLN
5000.0000 [IU] | Freq: Three times a day (TID) | INTRAMUSCULAR | Status: DC
  Administered 2020-04-08 – 2020-04-13 (×18): 5000 [IU] via SUBCUTANEOUS
  Filled 2020-04-07 (×18): qty 1

## 2020-04-07 NOTE — Progress Notes (Signed)
Occupational Therapy Treatment Patient Details Name: Bradley Johns MRN: [id number] DOB: December 23, 1934 Today's Date: 04/07/2020    History of present illness Bradley Johns is a 86 y.o. admitted with syncope. Pt with age indeterminate nasal fx. Worsening hypoxemia 12/27. 12/28 with PEA arrest and transfer to ICU on bipap. PMHx:OSA; HTN; HLD; DM; pulmonary fibrosis/COPD on home O2; and BPH   OT comments  Pt making steady progress towards OT goals this session. Pt continues to present with decreased activity tolerance, generalized weakness and decreased ability to care for self. Pt on 4L O2 upon arrival with sats 93% at rest. Pt able to complete bed mobility with min guard and completed simulated toile transfer from EOb>recliner with Rw and min guard assist, pt with drop in O2 to 83% during mobility needing seated rest break to increase back to >90%. Pt currently requires set-up assist for UB ADLs and MAX A for LB ADLs. Pt would continue to benefit from skilled occupational therapy while admitted and after d/c to address the below listed limitations in order to improve overall functional mobility and facilitate independence with BADL participation. DC plan remains appropriate, will follow acutely per POC.     Follow Up Recommendations  Home health OT    Equipment Recommendations  3 in 1 bedside commode    Recommendations for Other Services      Precautions / Restrictions Precautions Precautions: Fall Precaution Comments: watch sats Restrictions Weight Bearing Restrictions: No       Mobility Bed Mobility Overal bed mobility: Needs Assistance Bed Mobility: Supine to Sit     Supine to sit: Min guard     General bed mobility comments: minguard for line mgmt and safety, cues for sequencing and body mechanics  Transfers Overall transfer level: Needs assistance Equipment used: Rolling walker (2 wheeled) Transfers: Sit to/from [facility] Sit to Stand: Min guard Stand  pivot transfers: Min guard       General transfer comment: for safety, lines    Balance Overall balance assessment: Needs assistance Sitting-balance support: No upper extremity supported;Feet supported Sitting balance-Leahy Scale: Good Sitting balance - Comments: ablel to sit EOB to eat breakfast   Standing balance support: Bilateral upper extremity supported;During functional activity Standing balance-Leahy Scale: Poor Standing balance comment: RW for standing and gait                           ADL either performed or assessed with clinical judgement   ADL Overall ADL's : Needs assistance/impaired     Grooming: Wash/dry face;Wash/dry hands;Oral care;Sitting;Set up Grooming Details (indicate cue type and reason): pt requested to complete grooming tasks from recliner Upper Body Bathing: Moderate assistance;Sitting Upper Body Bathing Details (indicate cue type and reason): to wash pts back EOB Lower Body Bathing: Maximal assistance;Sitting/lateral leans Lower Body Bathing Details (indicate cue type and reason): to wash pts feet after condom cath fell off         Toilet Transfer: Min Film/video editor Details (indicate cue type and reason): min guard for simulated toilet transfer from EOB>recliner with RW         Functional mobility during ADLs: Min guard;Rolling walker General ADL Comments: pt on 7L during session with O2 sats briefly dropping to 83%, rested break needed to rebound to >90%. pt continues to present with decreased activity tolerance, generalized weakness and decreased ability to care for self     Vision       Perception  Praxis      Cognition Arousal/Alertness: Awake/alert Behavior During Therapy: WFL for tasks assessed/performed Overall Cognitive Status: Within Functional Limits for tasks assessed                                          Exercises     Shoulder Instructions       General  Comments pt on 4L upon arrival with sats 93% at rest, during limited mobility to chair sats dropped to as low as 83% needing seated rest break to rebound to >90%. pt left on 4L with sats 90% at end of session    Pertinent Vitals/ Pain       Pain Assessment: Faces Faces Pain Scale: Hurts a little bit Pain Location: stomach from sitting EOB with flexed trunk Pain Descriptors / Indicators: Discomfort;Pressure Pain Intervention(s): Monitored during session;Repositioned  Home Living                                          Prior Functioning/Environment              Frequency  Min 2X/week        Progress Toward Goals  OT Goals(current goals can now be found in the care plan section)  Progress towards OT goals: Progressing toward goals  Acute Rehab OT Goals Patient Stated Goal: to go home OT Goal Formulation: With patient Time For Goal Achievement: 04/18/20 Potential to Achieve Goals: Good  Plan Discharge plan remains appropriate;Frequency remains appropriate    Co-evaluation                 AM-PAC OT "6 Clicks" Daily Activity     Outcome Measure   Help from another person eating meals?: None Help from another person taking care of personal grooming?: A Little Help from another person toileting, which includes using toliet, bedpan, or urinal?: A Little Help from another person bathing (including washing, rinsing, drying)?: A Little Help from another person to put on and taking off regular upper body clothing?: A Little Help from another person to put on and taking off regular lower body clothing?: A Lot 6 Click Score: 18    End of Session Equipment Utilized During Treatment: Gait belt;Rolling walker;Oxygen;Other (comment) ( 4L)  OT Visit Diagnosis: Unsteadiness on feet (R26.81);Other abnormalities of gait and mobility (R26.89);Muscle weakness (generalized) (M62.81)   Activity Tolerance Patient tolerated treatment well   Patient Left in  chair;with call bell/phone within reach   Nurse Communication Mobility status;Other (comment) (needs new condom cath)        Time: 7680-8811 OT Time Calculation (min): 33 min  Charges: OT General Charges $OT Visit: 1 Visit OT Treatments $Self Care/Home Management : 23-37 mins  Lenor Derrick., COTA/L Acute Rehabilitation Services [phone]    [name] 04/07/2020, 11:11 AM

## 2020-04-07 NOTE — TOC Transition Note (Signed)
Transition of Care [facility]) - CM/SW Discharge Note   Patient Details  Name: Bradley Johns MRN: [id number] Date of Birth: 01-26-1935  [facility] of [facility]) CM/SW Contact:  Lockie Pares, RN Phone Number: 04/07/2020, 9:51 AM   Clinical Narrative:    Called patient to make sure his DME had arrived, the patient does not know, but stated he was not going home today per MD. Securechat to staff to ask them about equipment. He is set up with [facility], no further needs identified.    Final next level of care: Home w Home Health Services Barriers to Discharge: No Barriers Identified   Patient Goals and CMS Choice Patient states their goals for this hospitalization and ongoing recovery are:: "breath easier" CMS Medicare.gov Compare Post Acute Care list provided to:: Patient Choice offered to / list presented to : Patient  Discharge Placement                       Discharge Plan and Services     Post Acute Care Choice: Home Health          DME Arranged: 3-N-1,[facility] Agency: [employer] Date DME Agency Contacted: 04/06/20     HH Arranged: PT,OT HH Agency: [facility] Date [facility] Agency Contacted: 04/05/20 Time HH Agency Contacted: [phone] Representative spoke with at [facility] Agency: Cindie  Social Determinants of Health (SDOH) Interventions     Readmission Risk Interventions No flowsheet data found.

## 2020-04-07 NOTE — Progress Notes (Addendum)
PROGRESS NOTE  Bradley Johns [id number] DOB: 08-Jun-1934 DOA: 03/31/2020 PCP: Colon Branch, MD  HPI/Recap of past 24 hours: Brief History:  86 year old male admitted 12/25 after syncopal episode and treated for anemia, then went on to have worsening hypoxia and escalating O2 up to 13L high flow. Brief PEA arrest in the early AM hours of 12/28.  Transferred to ICU, placed on BiPAP and diuresed.   History of Present Illness:  86 year old male with past medical history as below, which is significant for pulmonary fibrosis and COPD on 3 to 5 L of home oxygen followed by Dr. Elsworth Soho, OSA, hypertension, diabetes mellitus, and BPH.  He suffered syncopal episode at home on 12/25 causing him to present to [facility] emergency department.  Upon arrival to the ED he was dyspneic and hemoglobin found to be 5.  Hemoccult negative.  Some vague complaints of intermittent hematuria.  He was transfused 2 units of PRBC and admitted to the hospital service.  His course then became complicated for worsening hypoxia.  VQ scan was done and PE was not felt to be likely.  On 12/27 oxygen requirement significantly increased and there was concern for pulmonary edema.  He was started on IV Lasix and Solu-Medrol.  In the early a.m. hours of 12/28 central telemetry notified his nurse of rhythm change at which time he was found to be bradycardic.  Shortly after he lost pulses to PEA arrest.  Very brief duration of CPR (less than 1 round) prior to ROSC.  Postarrest he was awake and alert.  Transferred to ICU for closer monitoring.  Past Medical History:   has a past medical history of Arthritis, Benign prostatic hypertrophy, COPD (chronic obstructive pulmonary disease) ([place]), Diabetes mellitus, Epidermal cyst of face, Gout, Hyperlipidemia, Hypertension, Pulmonary fibrosis ([place]) (2014), and Sleep apnea.  Significant Hospital Events:  12/25 admit, anemic, 2 units PRBC.  12/27 worsening O2 requirements  12/28 PEA arrest tx to  ICU. Started on BiPAP  Transferred to [facility] service on 04/05/2020.   Hospital course complicated by worsening hypoxia with ambulation.  O2 saturation dropped with ambulation requiring 15 L while ambulating with O2 saturation of 87%.  At rest he is requiring 7 L with O2 saturation of 92%.  Very deconditioned.  Will need to continue PT OT with assistance and fall precautions.  Out of bed to chair with every shift.  04/07/20: Seen and examined at his bedside.  He reports dyspnea with minimal exertion.  Also reports generalized weakness.  Assessment/Plan: Principal Problem:   Symptomatic anemia Active Problems:   Diabetes mellitus type 2 in nonobese (HCC)   Hyperlipidemia   Essential hypertension   COPD with emphysema, on O2   BPH (benign prostatic hyperplasia)   Dyspnea on exertion   Pulmonary fibrosis (HCC)   Chronic respiratory failure with hypoxia (HCC)   Syncope and collapse   AKI (acute kidney injury) ([place])   Hypoxia   Acute respiratory distress   Cardiac arrest (HCC)  Cardiac Arrest:  brief PEA arrest < 3 minutes. Likely etiology is hypoxia as patient was requiring 13L [place] and was found without nasal cannula in and component of heart failure with elevated BNP. TTE 12/26 shows normal LVEF, normal wall function and normal diastolic parameters; elevated PAP 58.4, normal RV.  NM perfusion test no evidence of acute PE; several multiple peripheral perfusion defects due to combination of COPD/ IPF - has remained neurologically intact Continue to wean off oxygen supplementation as tolerated.  Continue PT OT Currently on high flow nasal cannula 7L, at baseline he is on 3 to 4 L nasal cannula.   He is euvolemic on exam, IV Lasix discontinued on 04/06/2020 due to euvolemia and worsening renal function. No chest pain reported.  Persistent acute on chronic hypoxia, hypercarbic respiratory failure, likely multifactorial, secondary to pulmonary edema superimposed on known IPF.   Was prescribed  OFEV, but was nervous to be seen in office due to COVID-19 pandemic and was never started.  - continue BiPAP PRN; continue HHFNC weaning O2 for sat goal 88-94% - diurese as below; day 3x/3 - ongoing aggressive pulmonary hygiene/ IS/ PT - intermittent CXR - steroids 12/27 > 12/28  Worsening AKI on CKD 3B likely prerenal in the setting of high doses of diuretics Baseline creatinine appears to be 1.4 with GFR of 49 Creatinine uptrending 2.2 from 1.7 Continue to avoid nephrotoxins, hypotension and monitor urine output. Diuretics were discontinued on 04/06/2020.. Daily renal panel.  Essential HTN, BPs are soft BP is currently at goal Continue low-dose of Coreg 3.125 mg twice daily Hold all of his other oral anti-hypertensives to avoid hypotension Continue to monitor vital signs  COPD: without acute exacerbation - Continue duonebs q 6hr and aggressive pulmonary hygiene - add mucinex   - no role for steroids at this time per PCCM.  Iron deficiency Anemia: symptomatic anemia with a hemoglobin of 5. 2 units transfused. Markedly low ferritin 11 - Follow CBC; Hgb trend stable  - Transfuse for Hgb < 7 - feraheme started 12/28; next dose 12/31, completed. - will need further outpt GI f/u  -Hemoglobin uptrending 8.3 from 7.6.  Prior history of hematochezia post colonoscopy in 2020 Will need to follow-up with GI outpatient posthospitalization. Continue to monitor H&H, transfuse hemoglobin less than 7.0. During this admission FOBT was negative.  Pulmonary edema/volume overload Resolved Currently euvolemic Stopped IV diuretics on 04/06/2020 BNP 86 on 04/06/2020 from greater than 1100 on 04/03/2020  Acute on chronic diastolic CHF Was diuresed adequately Euvolemic Diuretics on hold due to AKI Continue strict I's and O's and daily weight Continue Coreg  Resolved hypokalemia post repletion. Monitor, replete and recheck as needed.  DM 2, well controlled Hemoglobin A1c 5.7 on  03/31/2020 - AC HS SSI- decrease to sensitive scale  Avoid hypoglycemia  Pulmonary nodule - outpatient follow-up in our pulmonary clinic  Chronic pain: - minimize sedating meds as able given hypercarbic resp failure.  Currently no complaints of pain.  Best practice (evaluated daily)  Diet: Heart heathy Pain/Anxiety/Delirium protocol (if indicated): NA VAP protocol (if indicated): NA DVT prophylaxis: Lovenox GI prophylaxis: PPI Glucose control: SSI Mobility: BR Disposition:ICU-> PCU      Code Status: Full code  Family Communication: None at bedside.  Disposition Plan: Likely will DC to home with home health services once oxygen requirement with ambulation is less than 6 L..   Consultants:  None.  Procedures:  None.  Antimicrobials:  None.  DVT prophylaxis: Subcu Lovenox daily  Status is: Inpatient    Dispo:  Patient From: Home  Planned Disposition: Home with Health Care Svc  Expected discharge date: 04/09/2020  Medically stable for discharge: No, ongoing management of acute on chronic hypoxic hypercarbic respiratory failure.  He is currently requiring 7 L at rest to maintain his O2 saturation greater than 90%.        Objective: Vitals:   04/07/20 0426 04/07/20 1036 04/07/20 1107 04/07/20 1220  BP: (!) 114/40 (!) 111/51  (!) 134/52  Pulse:  62 68  72  Resp: 16   18  Temp: 98.6 F (37 C)   98.6 F (37 C)  TempSrc: Oral   Oral  SpO2: 93%  93% 95%  Weight:      Height:        Intake/Output Summary (Last 24 hours) at 04/07/2020 1505 Last data filed at 04/07/2020 1034 Gross per 24 hour  Intake 6 ml  Output 800 ml  Net -794 ml   Filed Weights   04/03/20 0600 04/04/20 0400 04/06/20 0347  Weight: 80.6 kg 80.6 kg 76.3 kg    Exam:  . General: 86 y.o. year-old male well-developed well-nourished no acute stress.  Alert and oriented x3.   . Cardiovascular: Regular rate and rhythm no rubs or gallops.  Marland Kitchen Respiratory: Mild rales at bases no  wheezing noted.  Poor inspiratory effort.   . Abdomen: Soft nontender no bowel sounds present. \ . Musculoskeletal: No lower extremity edema bilaterally.   Marland Kitchen Psychiatry: Mood is appropriate for condition and setting.   Data Reviewed: CBC: Recent Labs  Lab 04/01/20 1649 04/02/20 0207 04/02/20 1658 04/03/20 0455 04/07/20 0109  WBC 7.5 8.1 6.3 5.8 9.2  NEUTROABS 5.9 5.9 5.5 4.9 6.0  HGB 7.6* 7.1* 7.4* 7.6* 8.3*  HCT 26.0* 24.2* 24.4* 26.7* 27.8*  MCV 83.3 83.2 82.4 84.2 84.8  PLT 195 186 200 191 233   Basic Metabolic Panel: Recent Labs  Lab 04/02/20 0207 04/03/20 0455 04/04/20 0047 04/05/20 0238 04/06/20 0146 04/07/20 0109  NA 138 138 141 139 138 137  K 4.3 4.6 3.5 4.1 3.9 4.2  CL 104 101 99 94* 91* 92*  CO2 26 27 31  34* 33* 34*  GLUCOSE 122* 146* 110* 102* 135* 112*  BUN 25* 28* 27* 33* 33* 47*  CREATININE 1.78* 1.55* 1.43* 1.64* 1.72* 2.28*  CALCIUM 8.6* 8.6* 8.8* 9.0 8.8* 8.6*  MG 1.6* 2.2  --  1.6*  --   --   PHOS  --   --   --  2.7  --  4.5   GFR: Estimated Creatinine Clearance: 23.8 mL/min (A) (by C-G formula based on SCr of 2.28 mg/dL (H)). Liver Function Tests: Recent Labs  Lab 04/02/20 0207 04/03/20 0455 04/07/20 0109  AST 27 22  --   ALT 21 19  --   ALKPHOS 64 59  --   BILITOT 0.9 0.9  --   PROT 6.2* 6.2*  --   ALBUMIN 2.8* 2.7* 2.6*   No results for input(s): LIPASE, AMYLASE in the last 168 hours. No results for input(s): AMMONIA in the last 168 hours. Coagulation Profile: No results for input(s): INR, PROTIME in the last 168 hours. Cardiac Enzymes: No results for input(s): CKTOTAL, CKMB, CKMBINDEX, TROPONINI in the last 168 hours. BNP (last 3 results) No results for input(s): PROBNP in the last 8760 hours. HbA1C: No results for input(s): HGBA1C in the last 72 hours. CBG: Recent Labs  Lab 04/06/20 1200 04/06/20 1544 04/06/20 2047 04/07/20 0833 04/07/20 1220  GLUCAP 250* 159* 125* 125* 130*   Lipid Profile: No results for input(s):  CHOL, HDL, LDLCALC, TRIG, CHOLHDL, LDLDIRECT in the last 72 hours. Thyroid Function Tests: No results for input(s): TSH, T4TOTAL, FREET4, T3FREE, THYROIDAB in the last 72 hours. Anemia Panel: No results for input(s): VITAMINB12, FOLATE, FERRITIN, TIBC, IRON, RETICCTPCT in the last 72 hours. Urine analysis:    Component Value Date/Time   COLORURINE STRAW (A) 04/01/2020 0541   APPEARANCEUR CLEAR 04/01/2020 0541   LABSPEC  1.008 04/01/2020 0541   PHURINE 5.0 04/01/2020 0541   GLUCOSEU NEGATIVE 04/01/2020 0541   GLUCOSEU NEGATIVE 10/16/2014 1203   HGBUR SMALL (A) 04/01/2020 0541   HGBUR negative 10/06/2006 0756   BILIRUBINUR NEGATIVE 04/01/2020 0541   BILIRUBINUR negative 09/09/2017 1217   KETONESUR NEGATIVE 04/01/2020 0541   PROTEINUR NEGATIVE 04/01/2020 0541   UROBILINOGEN 0.2 09/09/2017 1217   UROBILINOGEN 1.0 10/16/2014 1203   NITRITE NEGATIVE 04/01/2020 0541   LEUKOCYTESUR NEGATIVE 04/01/2020 0541   Sepsis Labs: @LABRCNTIP (procalcitonin:4,lacticidven:4)  ) Recent Results (from the past 240 hour(s))  Resp Panel by RT-PCR (Flu A&B, Covid) Nasopharyngeal Swab     Status: None   Collection Time: 03/31/20  7:05 AM   Specimen: Nasopharyngeal Swab; Nasopharyngeal(NP) swabs in vial transport medium  Result Value Ref Range Status   SARS Coronavirus 2 by RT PCR NEGATIVE NEGATIVE Final    Comment: (NOTE) SARS-CoV-2 target nucleic acids are NOT DETECTED.  The SARS-CoV-2 RNA is generally detectable in upper respiratory specimens during the acute phase of infection. The lowest concentration of SARS-CoV-2 viral copies this assay can detect is 138 copies/mL. A negative result does not preclude SARS-Cov-2 infection and should not be used as the sole basis for treatment or other patient management decisions. A negative result may occur with  improper specimen collection/handling, submission of specimen other than nasopharyngeal swab, presence of viral mutation(s) within the areas targeted  by this assay, and inadequate number of viral copies(<138 copies/mL). A negative result must be combined with clinical observations, patient history, and epidemiological information. The expected result is Negative.  Fact Sheet for Patients:  EntrepreneurPulse.com.au  Fact Sheet for Healthcare Providers:  IncredibleEmployment.be  This test is no t yet approved or cleared by the [place] FDA and  has been authorized for detection and/or diagnosis of SARS-CoV-2 by FDA under an Emergency Use Authorization (EUA). This EUA will remain  in effect (meaning this test can be used) for the duration of the COVID-19 declaration under Section 564(b)(1) of the Act, 21 U.S.C.section 360bbb-3(b)(1), unless the authorization is terminated  or revoked sooner.       Influenza A by PCR NEGATIVE NEGATIVE Final   Influenza B by PCR NEGATIVE NEGATIVE Final    Comment: (NOTE) The Xpert Xpress SARS-CoV-2/FLU/RSV plus assay is intended as an aid in the diagnosis of influenza from Nasopharyngeal swab specimens and should not be used as a sole basis for treatment. Nasal washings and aspirates are unacceptable for Xpert Xpress SARS-CoV-2/FLU/RSV testing.  Fact Sheet for Patients: EntrepreneurPulse.com.au  Fact Sheet for Healthcare Providers: IncredibleEmployment.be  This test is not yet approved or cleared by the [place] FDA and has been authorized for detection and/or diagnosis of SARS-CoV-2 by FDA under an Emergency Use Authorization (EUA). This EUA will remain in effect (meaning this test can be used) for the duration of the COVID-19 declaration under Section 564(b)(1) of the Act, 21 U.S.C. section 360bbb-3(b)(1), unless the authorization is terminated or revoked.  Performed at [facility], [place] [address], [place], [place] [address]   MRSA PCR Screening     Status: None   Collection Time: 04/03/20  7:07 AM    Specimen: Nasopharyngeal  Result Value Ref Range Status   MRSA by PCR NEGATIVE NEGATIVE Final    Comment:        The GeneXpert MRSA Assay (FDA approved for NASAL specimens only), is one component of a comprehensive MRSA colonization surveillance program. It is not intended to diagnose MRSA infection nor to  guide or monitor treatment for MRSA infections. Performed at [facility], [address] [place], [place] [address]       Studies: No results found.  Scheduled Meds: . aspirin EC  81 mg Oral Daily  . atorvastatin  80 mg Oral QPM  . carvedilol  3.125 mg Oral BID WC  . Chlorhexidine Gluconate Cloth  6 each Topical Daily  . doxazosin  4 mg Oral QHS  . guaiFENesin  600 mg Oral BID  . [START ON 04/08/2020] heparin injection (subcutaneous)  5,000 Units Subcutaneous Q8H  . insulin aspart  0-5 Units Subcutaneous QHS  . insulin aspart  0-9 Units Subcutaneous TID WC  . mouth rinse  15 mL Mouth Rinse BID  . senna  2 tablet Oral Daily  . sodium chloride flush  3 mL Intravenous Q12H    Continuous Infusions:    LOS: 7 days     Darlin Drop, MD [facility] Pager [phone]  If 7PM-7AM, please contact night-coverage [email or web address] Password [facility] 04/07/2020, 3:05 PM

## 2020-04-08 DIAGNOSIS — D649 Anemia, unspecified: Secondary | ICD-10-CM | POA: Diagnosis not present

## 2020-04-08 LAB — GLUCOSE, CAPILLARY
Glucose-Capillary: 125 mg/dL — ABNORMAL HIGH (ref 70–99)
Glucose-Capillary: 141 mg/dL — ABNORMAL HIGH (ref 70–99)
Glucose-Capillary: 133 mg/dL — ABNORMAL HIGH (ref 70–99)
Glucose-Capillary: 132 mg/dL — ABNORMAL HIGH (ref 70–99)

## 2020-04-08 LAB — BASIC METABOLIC PANEL
Anion gap: 11 (ref 5–15)
BUN: 52 mg/dL — ABNORMAL HIGH (ref 8–23)
CO2: 33 mmol/L — ABNORMAL HIGH (ref 22–32)
Calcium: 8.3 mg/dL — ABNORMAL LOW (ref 8.9–10.3)
Chloride: 93 mmol/L — ABNORMAL LOW (ref 98–111)
Creatinine, Ser: 1.97 mg/dL — ABNORMAL HIGH (ref 0.61–1.24)
GFR, Estimated: 33 mL/min — ABNORMAL LOW (ref >60)
Glucose, Bld: 142 mg/dL — ABNORMAL HIGH (ref 70–99)
Potassium: 3.8 mmol/L (ref 3.5–5.1)
Sodium: 137 mmol/L (ref 135–145)

## 2020-04-08 LAB — MAGNESIUM: Magnesium: 2.1 mg/dL (ref 1.7–2.4)

## 2020-04-08 MED ORDER — MAGNESIUM SULFATE 2 GM/50ML IV SOLN
2.0000 g | Freq: Once | INTRAVENOUS | Status: AC
  Administered 2020-04-08: 2 g via INTRAVENOUS
  Filled 2020-04-08: qty 50

## 2020-04-08 NOTE — Progress Notes (Signed)
PROGRESS NOTE  Bradley Johns [id number] DOB: [birth year] DOA: 03/31/2020 PCP: Colon Branch, MD  HPI/Recap of past 24 hours: Brief History:  86 year old male admitted 12/25 after syncopal episode and treated for anemia, then went on to have worsening hypoxia and escalating O2 up to 13L high flow. Brief PEA arrest in the early AM hours of 12/28.  Transferred to ICU, placed on BiPAP and diuresed.   History of Present Illness:  86 year old male with past medical history as below, which is significant for pulmonary fibrosis and COPD on 3 to 5 L of home oxygen followed by Dr. Elsworth Soho, OSA, hypertension, diabetes mellitus, and BPH.  He suffered syncopal episode at home on 12/25 causing him to present to [facility] emergency department.  Upon arrival to the ED he was dyspneic and hemoglobin found to be 5.  Hemoccult negative.  Some vague complaints of intermittent hematuria.  He was transfused 2 units of PRBC and admitted to the hospital service.  His course then became complicated for worsening hypoxia.  VQ scan was done and PE was not felt to be likely.  On 12/27 oxygen requirement significantly increased and there was concern for pulmonary edema.  He was started on IV Lasix and Solu-Medrol.  In the early a.m. hours of 12/28 central telemetry notified his nurse of rhythm change at which time he was found to be bradycardic.  Shortly after he lost pulses to PEA arrest.  Very brief duration of CPR (less than 1 round) prior to ROSC.  Postarrest he was awake and alert.  Transferred to ICU for closer monitoring.  Past Medical History:   has a past medical history of Arthritis, Benign prostatic hypertrophy, COPD (chronic obstructive pulmonary disease) ([place]), Diabetes mellitus, Epidermal cyst of face, Gout, Hyperlipidemia, Hypertension, Pulmonary fibrosis ([place]) (2014), and Sleep apnea.  Significant Hospital Events:  12/25 admit, anemic, 2 units PRBC.  12/27 worsening O2 requirements  12/28 PEA arrest tx to  ICU. Started on BiPAP  Transferred to [facility] service on 04/05/2020.   Hospital course complicated by worsening hypoxia with ambulation.  O2 saturation dropped with ambulation requiring 15 L while ambulating with O2 saturation of 87%.  At rest he is requiring 7 L with O2 saturation of 92%.  Very deconditioned.  Will need to continue PT OT with assistance and fall precautions.  Out of bed to chair with every shift.  04/08/20: Seen and examined at his bedside.  No acute events overnight.  He is alert and oriented x4.  Denies any chest pain or dyspnea at rest at a time of this visit.  We will get a home oxygen evaluation to assess his oxygen requirement with ambulation for DC planning.  TOC has been reconsulted as the patient's wife is concerned about his safety at their two-story home.  She states she is afraid that he will fall on the stairs.   Assessment/Plan: Principal Problem:   Symptomatic anemia Active Problems:   Diabetes mellitus type 2 in nonobese (HCC)   Hyperlipidemia   Essential hypertension   COPD with emphysema, on O2   BPH (benign prostatic hyperplasia)   Dyspnea on exertion   Pulmonary fibrosis (HCC)   Chronic respiratory failure with hypoxia (HCC)   Syncope and collapse   AKI (acute kidney injury) ([place])   Hypoxia   Acute respiratory distress   Cardiac arrest (HCC)  Cardiac Arrest:  brief PEA arrest < 3 minutes. Likely etiology is hypoxia as patient was requiring 13L [place] and  was found without nasal cannula in and component of heart failure with elevated BNP. TTE 12/26 shows normal LVEF, normal wall function and normal diastolic parameters; elevated PAP 58.4, normal RV.  NM perfusion test no evidence of acute PE; several multiple peripheral perfusion defects due to combination of COPD/ IPF - has remained neurologically intact Continue to wean off oxygen supplementation as tolerated.  Continue PT OT Currently on high flow nasal cannula 6L with O2 saturation of 97%, at baseline  he is on 3 to 4 L nasal cannula.   He is euvolemic on exam, IV Lasix discontinued on 04/06/2020 due to euvolemia and worsening renal function. Denies any anginal symptoms.  Persistent acute on chronic hypoxia, hypercarbic respiratory failure, likely multifactorial, secondary to pulmonary edema superimposed on known IPF.   Was prescribed OFEV, but was nervous to be seen in office due to COVID-19 pandemic and was never started.  - continue BiPAP PRN; continue HHFNC weaning O2 for sat goal 88-94% - Completed diuresing. - Continue pulmonary hygiene/ IS/ PT - steroids 12/27 > 12/28  Improving nonoliguric AKI on CKD 3B likely prerenal in the setting of high doses of diuretics Baseline creatinine appears to be 1.4 with GFR of 49 Creatinine is currently downtrending to 1.9 from 2.2. Continue to avoid nephrotoxins, hypotension and dehydration. Diuretics were discontinued on 04/06/2020. Good urine output  Essential HTN, BPs are soft Previous currently at goal. Continue low-dose of Coreg 3.125 mg twice daily Hold all of his other oral anti-hypertensives hold losartan and hold Norvasc to avoid hypotension Will need to have a close follow-up appointment with his primary care provider.  COPD: without acute exacerbation - Continue duonebs q 6hr and aggressive pulmonary hygiene - add mucinex   - no role for steroids at this time per PCCM.  Iron deficiency Anemia:  Symptomatic anemia with a hemoglobin of 5. 2 units transfused. Markedly low ferritin 11 - Follow CBC; Hgb trend stable  - Transfuse for Hgb < 7 - feraheme started 12/28; next dose 12/31, completed. - will need further outpt GI f/u  -Advised him and his son to follow-up with GI outpatient.  They understand and agree to plan. -Hemoglobin uptrending 8.3 from 7.6.  Prior history of hematochezia post colonoscopy in 2020 Will need to follow-up with GI outpatient posthospitalization. Continue to monitor H&H, transfuse hemoglobin less than  7.0. During this admission FOBT was negative.  Resolved pulmonary edema/volume overload Currently euvolemic Stopped IV diuretics on 04/06/2020 BNP 86 on 04/06/2020 from greater than 1100 on 04/03/2020  Acute on chronic diastolic CHF Was diuresed adequately Euvolemic Continue strict I's and O's and daily weight Continue Coreg Net I&O -7.8 L.  Resolved hypokalemia post repletion. Monitor, replete and recheck as needed. Serum potassium 3.8, magnesium 2.1.  DM 2, well controlled Hemoglobin A1c 5.7 on 03/31/2020 - AC HS SSI- decrease to sensitive scale  Avoid hypoglycemia.  Pulmonary nodule - outpatient follow-up in our pulmonary clinic  Chronic pain: - Continue to minimize sedating meds as able given hypercarbic resp failure.  Currently no complaints of pain.  Best practice (evaluated daily)  Diet: Heart heathy Pain/Anxiety/Delirium protocol (if indicated): NA VAP protocol (if indicated): NA DVT prophylaxis: Lovenox GI prophylaxis: PPI Glucose control: SSI Mobility: BR Disposition:ICU-> PCU      Code Status: Full code  Family Communication: Updated his wife via phone on 04/08/2020.  Disposition Plan: Likely will DC to home with home health services once oxygen requirement with ambulation is less than 6 L..   Consultants:  None.  Procedures:  None.  Antimicrobials:  None.  DVT prophylaxis: Subcu Lovenox daily  Status is: Inpatient    Dispo:  Patient From: Home  Planned Disposition: Home with Health Care Svc  Expected discharge date: 04/09/2020  Medically stable for discharge: No, ongoing management of acute on chronic hypoxic hypercarbic respiratory failure.  He is currently requiring 6 L at rest to maintain his O2 saturation greater than 90%.        Objective: Vitals:   04/07/20 2129 04/08/20 0518 04/08/20 0830 04/08/20 1411  BP: (!) 131/48 (!) 127/43  (!) 128/55  Pulse: 65 60  65  Resp: 18 17  18   Temp: 98.6 F (37 C) 97.7 F (36.5  C)  98.1 F (36.7 C)  TempSrc: Oral Oral    SpO2: 100% 100% 95% 97%  Weight:      Height:        Intake/Output Summary (Last 24 hours) at 04/08/2020 1514 Last data filed at 04/08/2020 0406 Gross per 24 hour  Intake 3 ml  Output 1100 ml  Net -1097 ml   Filed Weights   04/03/20 0600 04/04/20 0400 04/06/20 0347  Weight: 80.6 kg 80.6 kg 76.3 kg    Exam:  . General: 86 y.o. year-old male well-developed well-nourished no distress.  Alert and oriented x3.   . Cardiovascular: Regular rate and rhythm no rubs or gallops. 91 Respiratory: Mild rales at bases no wheezing noted.  Poor inspiratory effort.   . Abdomen: Soft nontender no bowel sounds present. . Musculoskeletal: No lower extremity edema bilaterally. Marland Kitchen Psychiatry: Mood is appropriate for condition and setting.   Data Reviewed: CBC: Recent Labs  Lab 04/01/20 1649 04/02/20 0207 04/02/20 1658 04/03/20 0455 04/07/20 0109  WBC 7.5 8.1 6.3 5.8 9.2  NEUTROABS 5.9 5.9 5.5 4.9 6.0  HGB 7.6* 7.1* 7.4* 7.6* 8.3*  HCT 26.0* 24.2* 24.4* 26.7* 27.8*  MCV 83.3 83.2 82.4 84.2 84.8  PLT 195 186 200 191 233   Basic Metabolic Panel: Recent Labs  Lab 04/02/20 0207 04/03/20 0455 04/04/20 0047 04/05/20 0238 04/06/20 0146 04/07/20 0109 04/08/20 0554  NA 138 138 141 139 138 137 137  K 4.3 4.6 3.5 4.1 3.9 4.2 3.8  CL 104 101 99 94* 91* 92* 93*  CO2 26 27 31  34* 33* 34* 33*  GLUCOSE 122* 146* 110* 102* 135* 112* 142*  BUN 25* 28* 27* 33* 33* 47* 52*  CREATININE 1.78* 1.55* 1.43* 1.64* 1.72* 2.28* 1.97*  CALCIUM 8.6* 8.6* 8.8* 9.0 8.8* 8.6* 8.3*  MG 1.6* 2.2  --  1.6*  --   --  2.1  PHOS  --   --   --  2.7  --  4.5  --    GFR: Estimated Creatinine Clearance: 27.5 mL/min (A) (by C-G formula based on SCr of 1.97 mg/dL (H)). Liver Function Tests: Recent Labs  Lab 04/02/20 0207 04/03/20 0455 04/07/20 0109  AST 27 22  --   ALT 21 19  --   ALKPHOS 64 59  --   BILITOT 0.9 0.9  --   PROT 6.2* 6.2*  --   ALBUMIN 2.8* 2.7* 2.6*    No results for input(s): LIPASE, AMYLASE in the last 168 hours. No results for input(s): AMMONIA in the last 168 hours. Coagulation Profile: No results for input(s): INR, PROTIME in the last 168 hours. Cardiac Enzymes: No results for input(s): CKTOTAL, CKMB, CKMBINDEX, TROPONINI in the last 168 hours. BNP (last 3 results) No results for input(s): PROBNP  in the last 8760 hours. HbA1C: No results for input(s): HGBA1C in the last 72 hours. CBG: Recent Labs  Lab 04/07/20 1220 04/07/20 1559 04/07/20 2133 04/08/20 0834 04/08/20 1142  GLUCAP 130* 121* 144* 125* 141*   Lipid Profile: No results for input(s): CHOL, HDL, LDLCALC, TRIG, CHOLHDL, LDLDIRECT in the last 72 hours. Thyroid Function Tests: No results for input(s): TSH, T4TOTAL, FREET4, T3FREE, THYROIDAB in the last 72 hours. Anemia Panel: No results for input(s): VITAMINB12, FOLATE, FERRITIN, TIBC, IRON, RETICCTPCT in the last 72 hours. Urine analysis:    Component Value Date/Time   COLORURINE STRAW (A) 04/01/2020 0541   APPEARANCEUR CLEAR 04/01/2020 0541   LABSPEC 1.008 04/01/2020 0541   PHURINE 5.0 04/01/2020 0541   GLUCOSEU NEGATIVE 04/01/2020 0541   GLUCOSEU NEGATIVE 10/16/2014 1203   HGBUR SMALL (A) 04/01/2020 0541   HGBUR negative 10/06/2006 0756   BILIRUBINUR NEGATIVE 04/01/2020 0541   BILIRUBINUR negative 09/09/2017 1217   KETONESUR NEGATIVE 04/01/2020 0541   PROTEINUR NEGATIVE 04/01/2020 0541   UROBILINOGEN 0.2 09/09/2017 1217   UROBILINOGEN 1.0 10/16/2014 1203   NITRITE NEGATIVE 04/01/2020 0541   LEUKOCYTESUR NEGATIVE 04/01/2020 0541   Sepsis Labs: @LABRCNTIP (procalcitonin:4,lacticidven:4)  ) Recent Results (from the past 240 hour(s))  Resp Panel by RT-PCR (Flu A&B, Covid) Nasopharyngeal Swab     Status: None   Collection Time: 03/31/20  7:05 AM   Specimen: Nasopharyngeal Swab; Nasopharyngeal(NP) swabs in vial transport medium  Result Value Ref Range Status   SARS Coronavirus 2 by RT PCR NEGATIVE  NEGATIVE Final    Comment: (NOTE) SARS-CoV-2 target nucleic acids are NOT DETECTED.  The SARS-CoV-2 RNA is generally detectable in upper respiratory specimens during the acute phase of infection. The lowest concentration of SARS-CoV-2 viral copies this assay can detect is 138 copies/mL. A negative result does not preclude SARS-Cov-2 infection and should not be used as the sole basis for treatment or other patient management decisions. A negative result may occur with  improper specimen collection/handling, submission of specimen other than nasopharyngeal swab, presence of viral mutation(s) within the areas targeted by this assay, and inadequate number of viral copies(<138 copies/mL). A negative result must be combined with clinical observations, patient history, and epidemiological information. The expected result is Negative.  Fact Sheet for Patients:  EntrepreneurPulse.com.au  Fact Sheet for Healthcare Providers:  IncredibleEmployment.be  This test is no t yet approved or cleared by the [place] FDA and  has been authorized for detection and/or diagnosis of SARS-CoV-2 by FDA under an Emergency Use Authorization (EUA). This EUA will remain  in effect (meaning this test can be used) for the duration of the COVID-19 declaration under Section 564(b)(1) of the Act, 21 U.S.C.section 360bbb-3(b)(1), unless the authorization is terminated  or revoked sooner.       Influenza A by PCR NEGATIVE NEGATIVE Final   Influenza B by PCR NEGATIVE NEGATIVE Final    Comment: (NOTE) The Xpert Xpress SARS-CoV-2/FLU/RSV plus assay is intended as an aid in the diagnosis of influenza from Nasopharyngeal swab specimens and should not be used as a sole basis for treatment. Nasal washings and aspirates are unacceptable for Xpert Xpress SARS-CoV-2/FLU/RSV testing.  Fact Sheet for Patients: EntrepreneurPulse.com.au  Fact Sheet for Healthcare  Providers: IncredibleEmployment.be  This test is not yet approved or cleared by the [place] FDA and has been authorized for detection and/or diagnosis of SARS-CoV-2 by FDA under an Emergency Use Authorization (EUA). This EUA will remain in effect (meaning this test can be used) for  the duration of the COVID-19 declaration under Section 564(b)(1) of the Act, 21 U.S.C. section 360bbb-3(b)(1), unless the authorization is terminated or revoked.  Performed at [facility], [place] [address], [place], [place] [address]   MRSA PCR Screening     Status: None   Collection Time: 04/03/20  7:07 AM   Specimen: Nasopharyngeal  Result Value Ref Range Status   MRSA by PCR NEGATIVE NEGATIVE Final    Comment:        The GeneXpert MRSA Assay (FDA approved for NASAL specimens only), is one component of a comprehensive MRSA colonization surveillance program. It is not intended to diagnose MRSA infection nor to guide or monitor treatment for MRSA infections. Performed at [facility], [place] [address], [place],  [address]       Studies: No results found.  Scheduled Meds: . aspirin EC  81 mg Oral Daily  . atorvastatin  80 mg Oral QPM  . carvedilol  3.125 mg Oral BID WC  . Chlorhexidine Gluconate Cloth  6 each Topical Daily  . doxazosin  4 mg Oral QHS  . guaiFENesin  600 mg Oral BID  . heparin injection (subcutaneous)  5,000 Units Subcutaneous Q8H  . insulin aspart  0-5 Units Subcutaneous QHS  . insulin aspart  0-9 Units Subcutaneous TID WC  . mouth rinse  15 mL Mouth Rinse BID  . senna  2 tablet Oral Daily  . sodium chloride flush  3 mL Intravenous Q12H    Continuous Infusions:    LOS: 8 days     Kayleen Memos, MD [facility] Pager [phone]  If 7PM-7AM, please contact night-coverage [email or web address] Password [facility] 04/08/2020, 3:14 PM

## 2020-04-09 DIAGNOSIS — D649 Anemia, unspecified: Secondary | ICD-10-CM | POA: Diagnosis not present

## 2020-04-09 LAB — GLUCOSE, CAPILLARY
Glucose-Capillary: 102 mg/dL — ABNORMAL HIGH (ref 70–99)
Glucose-Capillary: 125 mg/dL — ABNORMAL HIGH (ref 70–99)
Glucose-Capillary: 122 mg/dL — ABNORMAL HIGH (ref 70–99)

## 2020-04-09 NOTE — Progress Notes (Signed)
SATURATION QUALIFICATIONS: (This note is used to comply with regulatory documentation for home oxygen)   Patient Saturations on 25 Liters of oxygen while Ambulating =86%  Please briefly explain why patient needs home oxygen: Pt requiring 6LO2 at rest, but upon ambulating 193ft pt requires 25L O2 and still cannot maintain sats over 90%. Upon returning to bed, pt requiring 10LO2 for 10 minutes to recover SpO2 levels upon 90%.

## 2020-04-09 NOTE — TOC Progression Note (Signed)
Transition of Care [facility]) - Progression Note    Patient Details  Name: Bradley Johns MRN: [id number] Date of Birth: Dec 09, 1934  [facility] of [facility]) CM/SW Contact  Carley Hammed, [place] Phone Number: 04/09/2020, 4:08 PM  Clinical Narrative:    CSW responded to a consult to speak with pt about his concerns for returning home. He asked that CSW to speak with his wife and daughter to figure things out.  CSW called the wife who was upset, stated she was terrified to bring him home and asking why the CSW didn't think he needed inpatient rehab. CSW's role was clarified, and reiterated the information Tammy Sours had given about the difference between SNF and LTC. CSW asked if they had looked into the ALF options Tammy Sours had given, and pt's wife noted that they could not afford it as it was several thousand dollars a month. Pt's wife noted that he also had SS.  CSW called several reps from facilities to ask questions. They all suggested that the pt start a Medicaid application, since he has SS and that could pay for the copays for LTC. Without Medicaid it would be around 190 a day. Many facilities also do not have LTC beds, but the ones who do said they may take someone with pending Medicaid.  CSW updated pt's family and they noted understanding. Short of the [facility] already setup, considering an ALF or LTC, and moving, there is not much else we can do. Pt's family said they would start Medicaid. Once that is pending, TOC may be able to find care. SW will continue to follow for transitions of care.   Expected Discharge Plan: Home w Home Health Services Barriers to Discharge: No Barriers Identified  Expected Discharge Plan and Services Expected Discharge Plan: Home w Home Health Services     Post Acute Care Choice: Home Health Living arrangements for the past 2 months: Single Family Home                 DME Arranged: 3-N-1,[place] DME Agency: [employer] Date DME Agency Contacted: 04/06/20     HH  Arranged: PT,OT HH Agency: [facility] Date HH Agency Contacted: 04/05/20 Time HH Agency Contacted: 1528 Representative spoke with at [facility] Agency: Cindie   Social Determinants of Health (SDOH) Interventions    Readmission Risk Interventions No flowsheet data found.

## 2020-04-09 NOTE — Progress Notes (Signed)
PROGRESS NOTE  Bradley Johns [id number] DOB: September 15, 1934 DOA: 03/31/2020 PCP: Colon Branch, MD  HPI/Recap of past 24 hours: Brief History:  86 year old male admitted 12/25 after syncopal episode and treated for anemia, then went on to have worsening hypoxia and escalating O2 up to 13L high flow. Brief PEA arrest in the early AM hours of 12/28.  Transferred to ICU, placed on BiPAP and diuresed.   History of Present Illness:  86 year old male with past medical history as below, which is significant for pulmonary fibrosis and COPD on 3 to 5 L of home oxygen followed by Dr. Elsworth Soho, OSA, hypertension, diabetes mellitus, and BPH.  He suffered syncopal episode at home on 12/25 causing him to present to [facility] emergency department.  Upon arrival to the ED he was dyspneic and hemoglobin found to be 5.  Hemoccult negative.  Some vague complaints of intermittent hematuria.  He was transfused 2 units of PRBC and admitted to the hospital service.  His course then became complicated for worsening hypoxia.  VQ scan was done and PE was not felt to be likely.  On 12/27 oxygen requirement significantly increased and there was concern for pulmonary edema.  He was started on IV Lasix and Solu-Medrol.  In the early a.m. hours of 12/28 central telemetry notified his nurse of rhythm change at which time he was found to be bradycardic.  Shortly after he lost pulses to PEA arrest.  Very brief duration of CPR (less than 1 round) prior to ROSC.  Postarrest he was awake and alert.  Transferred to ICU for closer monitoring.  Past Medical History:   has a past medical history of Arthritis, Benign prostatic hypertrophy, COPD (chronic obstructive pulmonary disease) ([place]), Diabetes mellitus, Epidermal cyst of face, Gout, Hyperlipidemia, Hypertension, Pulmonary fibrosis ([place]) (2014), and Sleep apnea.  Significant Hospital Events:  12/25 admit, anemic, 2 units PRBC.  12/27 worsening O2 requirements  12/28 PEA arrest tx to  ICU. Started on BiPAP  Transferred to [facility] service on 04/05/2020.   Hospital course complicated by worsening hypoxia with ambulation.  O2 saturation dropped with ambulation requiring 15 L while ambulating with O2 saturation of 87%.  At rest he is requiring 6 L with O2 saturation of 97%.  Very deconditioned.  Will need to continue PT OT with assistance and fall precautions.  Out of bed to chair with every shift.  TOC has been reconsulted as the patient's wife is concerned about his safety at their two-story home.  She states she is afraid that he will fall on the stairs.  04/09/20: Patient was seen and examined at bedside this morning.  There were no acute events overnight.  He states his breathing is improved this morning.  However he is still requiring at least 6 L to maintain his O2 saturation above 90%.    Assessment/Plan: Principal Problem:   Symptomatic anemia Active Problems:   Diabetes mellitus type 2 in nonobese (HCC)   Hyperlipidemia   Essential hypertension   COPD with emphysema, on O2   BPH (benign prostatic hyperplasia)   Dyspnea on exertion   Pulmonary fibrosis (HCC)   Chronic respiratory failure with hypoxia (HCC)   Syncope and collapse   AKI (acute kidney injury) ([place])   Hypoxia   Acute respiratory distress   Cardiac arrest (HCC)  Cardiac Arrest:  brief PEA arrest < 3 minutes. Likely etiology is hypoxia as patient was requiring 13L [place] and was found without nasal cannula in and component  of heart failure with elevated BNP. TTE 12/26 shows normal LVEF, normal wall function and normal diastolic parameters; elevated PAP 58.4, normal RV.  NM perfusion test no evidence of acute PE; several multiple peripheral perfusion defects due to combination of COPD/ IPF - has remained neurologically intact Continue to wean off oxygen supplementation as tolerated.  Continue PT OT Currently on high flow nasal cannula 6L with O2 saturation of 97%, at baseline he is on 3 to 4 L nasal  cannula.   He is euvolemic on exam, IV Lasix discontinued on 04/06/2020 due to euvolemia and worsening renal function. Denies any anginal symptoms at the time of this visit.  Persistent acute on chronic hypoxia, hypercarbic respiratory failure, likely multifactorial, secondary to pulmonary edema superimposed on known IPF.   Was prescribed OFEV, but was nervous to be seen in office due to COVID-19 pandemic and was never started.  - continue BiPAP PRN; continue HHFNC weaning O2 for sat goal 88-94% - Completed diuresing. - Continue pulmonary hygiene/ IS/ PT - steroids 12/27 > 12/28 -Obtain home oxygen evaluation for DC planning.  Improving nonoliguric AKI on CKD 3B likely prerenal in the setting of high doses of diuretics Baseline creatinine appears to be 1.4 with GFR of 49 Creatinine is currently downtrending to 1.9 from 2.2. Continue to avoid nephrotoxins, hypotension and dehydration. Diuretics were discontinued on 04/06/2020. Good urine output  Essential HTN, BPs are soft Previous currently at goal. Continue low-dose of Coreg 3.125 mg twice daily Hold all of his other oral anti-hypertensives hold losartan and hold Norvasc to avoid hypotension Will need to have a close follow-up appointment with his primary care provider.  COPD: without acute exacerbation - Continue duonebs q 6hr and aggressive pulmonary hygiene - add mucinex   - no role for steroids at this time per PCCM.  Iron deficiency Anemia:  Symptomatic anemia with a hemoglobin of 5. 2 units transfused. Markedly low ferritin 11 - Follow CBC; Hgb trend stable  - Transfuse for Hgb < 7 - feraheme started 12/28; next dose 12/31, completed. - will need further outpt GI f/u  -Advised him and his son to follow-up with GI outpatient.  They understand and agree to plan. -Hemoglobin uptrending 8.3 from 7.6.  Prior history of hematochezia post colonoscopy in 2020 Will need to follow-up with GI outpatient  posthospitalization. Continue to monitor H&H, transfuse hemoglobin less than 7.0. During this admission FOBT was negative.  Resolved pulmonary edema/volume overload Currently euvolemic Stopped IV diuretics on 04/06/2020 BNP 86 on 04/06/2020 from greater than 1100 on 04/03/2020  Acute on chronic diastolic CHF Was diuresed adequately Euvolemic Continue strict I's and O's and daily weight Continue Coreg Net I&O -7.8 L.  Resolved hypokalemia post repletion. Monitor, replete and recheck as needed. Serum potassium 3.8, magnesium 2.1.  DM 2, well controlled Hemoglobin A1c 5.7 on 03/31/2020 - AC HS SSI- decrease to sensitive scale  Avoid hypoglycemia.  Pulmonary nodule - outpatient follow-up in our pulmonary clinic  Chronic pain: - Continue to minimize sedating meds as able given hypercarbic resp failure.  Currently no complaints of pain.  Best practice (evaluated daily)  Diet: Heart heathy Pain/Anxiety/Delirium protocol (if indicated): NA VAP protocol (if indicated): NA DVT prophylaxis: Lovenox GI prophylaxis: PPI Glucose control: SSI Mobility: BR Disposition:ICU-> PCU      Code Status: Full code  Family Communication: Updated his wife via phone on 04/08/2020.  Disposition Plan: Likely will DC to home with home health services once oxygen requirement with ambulation is less than  6 L..   Consultants:  None.  Procedures:  None.  Antimicrobials:  None.  DVT prophylaxis: Subcu Lovenox daily  Status is: Inpatient    Dispo:  Patient From: Home  Planned Disposition: Home with Health Care Svc  Expected discharge date: 04/10/20  Medically stable for discharge: No, ongoing management of acute on chronic hypoxic hypercarbic respiratory failure.  He is currently requiring 6 L at rest to maintain his O2 saturation greater than 90%.        Objective: Vitals:   04/08/20 0830 04/08/20 1411 04/08/20 1946 04/09/20 0541  BP:  (!) 128/55 126/71 (!) 131/43   Pulse:  65 67 63  Resp:  18 18 16   Temp:  98.1 F (36.7 C) 98.3 F (36.8 C) 98 F (36.7 C)  TempSrc:   Oral   SpO2: 95% 97% 97% 91%  Weight:      Height:        Intake/Output Summary (Last 24 hours) at 04/09/2020 1226 Last data filed at 04/08/2020 2103 Gross per 24 hour  Intake 3 ml  Output 1200 ml  Net -1197 ml   Filed Weights   04/03/20 0600 04/04/20 0400 04/06/20 0347  Weight: 80.6 kg 80.6 kg 76.3 kg    Exam:  . General: 86 y.o. year-old male chronically ill-appearing no acute stress.  Alert and oriented x3.   . Cardiovascular: Regular rate and rhythm no rubs or gallops.  91 Respiratory: Virucide bases no wheezing noted.   . Abdomen: Soft nontender no bowel sounds present.  . Musculoskeletal: No lower extremity edema bilaterally. Marland Kitchen Psychiatry: Mood is appropriate for condition and setting.   Data Reviewed: CBC: Recent Labs  Lab 04/02/20 1658 04/03/20 0455 04/07/20 0109  WBC 6.3 5.8 9.2  NEUTROABS 5.5 4.9 6.0  HGB 7.4* 7.6* 8.3*  HCT 24.4* 26.7* 27.8*  MCV 82.4 84.2 84.8  PLT 200 191 233   Basic Metabolic Panel: Recent Labs  Lab 04/03/20 0455 04/04/20 0047 04/05/20 0238 04/06/20 0146 04/07/20 0109 04/08/20 0554  NA 138 141 139 138 137 137  K 4.6 3.5 4.1 3.9 4.2 3.8  CL 101 99 94* 91* 92* 93*  CO2 27 31 34* 33* 34* 33*  GLUCOSE 146* 110* 102* 135* 112* 142*  BUN 28* 27* 33* 33* 47* 52*  CREATININE 1.55* 1.43* 1.64* 1.72* 2.28* 1.97*  CALCIUM 8.6* 8.8* 9.0 8.8* 8.6* 8.3*  MG 2.2  --  1.6*  --   --  2.1  PHOS  --   --  2.7  --  4.5  --    GFR: Estimated Creatinine Clearance: 27.5 mL/min (A) (by C-G formula based on SCr of 1.97 mg/dL (H)). Liver Function Tests: Recent Labs  Lab 04/03/20 0455 04/07/20 0109  AST 22  --   ALT 19  --   ALKPHOS 59  --   BILITOT 0.9  --   PROT 6.2*  --   ALBUMIN 2.7* 2.6*   No results for input(s): LIPASE, AMYLASE in the last 168 hours. No results for input(s): AMMONIA in the last 168 hours. Coagulation  Profile: No results for input(s): INR, PROTIME in the last 168 hours. Cardiac Enzymes: No results for input(s): CKTOTAL, CKMB, CKMBINDEX, TROPONINI in the last 168 hours. BNP (last 3 results) No results for input(s): PROBNP in the last 8760 hours. HbA1C: No results for input(s): HGBA1C in the last 72 hours. CBG: Recent Labs  Lab 04/08/20 1142 04/08/20 1545 04/08/20 2247 04/09/20 0742 04/09/20 1200  GLUCAP 141* 133* 132*  102* 125*   Lipid Profile: No results for input(s): CHOL, HDL, LDLCALC, TRIG, CHOLHDL, LDLDIRECT in the last 72 hours. Thyroid Function Tests: No results for input(s): TSH, T4TOTAL, FREET4, T3FREE, THYROIDAB in the last 72 hours. Anemia Panel: No results for input(s): VITAMINB12, FOLATE, FERRITIN, TIBC, IRON, RETICCTPCT in the last 72 hours. Urine analysis:    Component Value Date/Time   COLORURINE STRAW (A) 04/01/2020 0541   APPEARANCEUR CLEAR 04/01/2020 0541   LABSPEC 1.008 04/01/2020 0541   PHURINE 5.0 04/01/2020 0541   GLUCOSEU NEGATIVE 04/01/2020 0541   GLUCOSEU NEGATIVE 10/16/2014 1203   HGBUR SMALL (A) 04/01/2020 0541   HGBUR negative 10/06/2006 0756   BILIRUBINUR NEGATIVE 04/01/2020 0541   BILIRUBINUR negative 09/09/2017 1217   KETONESUR NEGATIVE 04/01/2020 0541   PROTEINUR NEGATIVE 04/01/2020 0541   UROBILINOGEN 0.2 09/09/2017 1217   UROBILINOGEN 1.0 10/16/2014 1203   NITRITE NEGATIVE 04/01/2020 0541   LEUKOCYTESUR NEGATIVE 04/01/2020 0541   Sepsis Labs: @LABRCNTIP (procalcitonin:4,lacticidven:4)  ) Recent Results (from the past 240 hour(s))  Resp Panel by RT-PCR (Flu A&B, Covid) Nasopharyngeal Swab     Status: None   Collection Time: 03/31/20  7:05 AM   Specimen: Nasopharyngeal Swab; Nasopharyngeal(NP) swabs in vial transport medium  Result Value Ref Range Status   SARS Coronavirus 2 by RT PCR NEGATIVE NEGATIVE Final    Comment: (NOTE) SARS-CoV-2 target nucleic acids are NOT DETECTED.  The SARS-CoV-2 RNA is generally detectable in  upper respiratory specimens during the acute phase of infection. The lowest concentration of SARS-CoV-2 viral copies this assay can detect is 138 copies/mL. A negative result does not preclude SARS-Cov-2 infection and should not be used as the sole basis for treatment or other patient management decisions. A negative result may occur with  improper specimen collection/handling, submission of specimen other than nasopharyngeal swab, presence of viral mutation(s) within the areas targeted by this assay, and inadequate number of viral copies(<138 copies/mL). A negative result must be combined with clinical observations, patient history, and epidemiological information. The expected result is Negative.  Fact Sheet for Patients:  EntrepreneurPulse.com.au  Fact Sheet for Healthcare Providers:  IncredibleEmployment.be  This test is no t yet approved or cleared by the [place] FDA and  has been authorized for detection and/or diagnosis of SARS-CoV-2 by FDA under an Emergency Use Authorization (EUA). This EUA will remain  in effect (meaning this test can be used) for the duration of the COVID-19 declaration under Section 564(b)(1) of the Act, 21 U.S.C.section 360bbb-3(b)(1), unless the authorization is terminated  or revoked sooner.       Influenza A by PCR NEGATIVE NEGATIVE Final   Influenza B by PCR NEGATIVE NEGATIVE Final    Comment: (NOTE) The Xpert Xpress SARS-CoV-2/FLU/RSV plus assay is intended as an aid in the diagnosis of influenza from Nasopharyngeal swab specimens and should not be used as a sole basis for treatment. Nasal washings and aspirates are unacceptable for Xpert Xpress SARS-CoV-2/FLU/RSV testing.  Fact Sheet for Patients: EntrepreneurPulse.com.au  Fact Sheet for Healthcare Providers: IncredibleEmployment.be  This test is not yet approved or cleared by the [place] FDA and has been  authorized for detection and/or diagnosis of SARS-CoV-2 by FDA under an Emergency Use Authorization (EUA). This EUA will remain in effect (meaning this test can be used) for the duration of the COVID-19 declaration under Section 564(b)(1) of the Act, 21 U.S.C. section 360bbb-3(b)(1), unless the authorization is terminated or revoked.  Performed at [facility], [place] [address] [place], [place]  [id number]   MRSA PCR Screening     Status: None   Collection Time: 04/03/20  7:07 AM   Specimen: Nasopharyngeal  Result Value Ref Range Status   MRSA by PCR NEGATIVE NEGATIVE Final    Comment:        The GeneXpert MRSA Assay (FDA approved for NASAL specimens only), is one component of a comprehensive MRSA colonization surveillance program. It is not intended to diagnose MRSA infection nor to guide or monitor treatment for MRSA infections. Performed at [facility], [place] [address], [place], [place] [address]       Studies: No results found.  Scheduled Meds: . aspirin EC  81 mg Oral Daily  . atorvastatin  80 mg Oral QPM  . carvedilol  3.125 mg Oral BID WC  . Chlorhexidine Gluconate Cloth  6 each Topical Daily  . doxazosin  4 mg Oral QHS  . guaiFENesin  600 mg Oral BID  . heparin injection (subcutaneous)  5,000 Units Subcutaneous Q8H  . insulin aspart  0-5 Units Subcutaneous QHS  . insulin aspart  0-9 Units Subcutaneous TID WC  . mouth rinse  15 mL Mouth Rinse BID  . senna  2 tablet Oral Daily  . sodium chloride flush  3 mL Intravenous Q12H    Continuous Infusions:    LOS: 9 days     Kayleen Memos, MD [facility] Pager [phone]  If 7PM-7AM, please contact night-coverage [email or web address] Password [facility] 04/09/2020, 12:26 PM

## 2020-04-10 DIAGNOSIS — D649 Anemia, unspecified: Secondary | ICD-10-CM | POA: Diagnosis not present

## 2020-04-10 LAB — GLUCOSE, CAPILLARY
Glucose-Capillary: 91 mg/dL (ref 70–99)
Glucose-Capillary: 74 mg/dL (ref 70–99)
Glucose-Capillary: 111 mg/dL — ABNORMAL HIGH (ref 70–99)
Glucose-Capillary: 103 mg/dL — ABNORMAL HIGH (ref 70–99)
Glucose-Capillary: 175 mg/dL — ABNORMAL HIGH (ref 70–99)
Glucose-Capillary: 124 mg/dL — ABNORMAL HIGH (ref 70–99)

## 2020-04-10 MED ORDER — OXYMETAZOLINE HCL 0.05 % NA SOLN
1.0000 | Freq: Two times a day (BID) | NASAL | Status: AC
  Administered 2020-04-10 – 2020-04-12 (×5): 1 via NASAL
  Filled 2020-04-10: qty 30

## 2020-04-10 MED ORDER — FERROUS SULFATE 325 (65 FE) MG PO TABS
325.0000 mg | ORAL_TABLET | Freq: Every day | ORAL | Status: DC
  Administered 2020-04-11 – 2020-04-13 (×3): 325 mg via ORAL
  Filled 2020-04-10 (×3): qty 1

## 2020-04-10 NOTE — Progress Notes (Signed)
Occupational Therapy Treatment Patient Details Name: Bradley Johns MRN: [id number] DOB: 1934/06/24 Today's Date: 04/10/2020    History of present illness Bradley Johns is a 86 y.o. admitted with syncope. Pt with age indeterminate nasal fx. Worsening hypoxemia 12/27. 12/28 with PEA arrest and transfer to ICU on bipap. PMHx:OSA; HTN; HLD; DM; pulmonary fibrosis/COPD on home O2; and BPH   OT comments  Pt progressing with O2 tolerance and activity; pt performing light ADL at EOB; HEP for BUEs and incorporating pursed lip breathing techniques; sit to stand and taking steps to [facility] with minguardA. Pt requiring 6L O2 throughout session with sats >84% with exertion. Pt taking 1 min to recover >90% O2. Pt would benefit from continued OT skilled services for ADL, mobility and safety in SNF setting. OT following acutely.   Follow Up Recommendations  SNF    Equipment Recommendations  3 in 1 bedside commode    Recommendations for Other Services      Precautions / Restrictions Precautions Precautions: Fall;Other (comment) Precaution Comments: watch sats       Mobility Bed Mobility Overal bed mobility: Needs Assistance Bed Mobility: Supine to Sit     Supine to sit: Supervision     General bed mobility comments: use of rail  Transfers Overall transfer level: Needs assistance Equipment used: Rolling walker (2 wheeled) Transfers: Sit to/from Stand Sit to Stand: Min guard         General transfer comment: Pt taking small steps to [facility]; fatigued from PT session earlier today    Balance Overall balance assessment: Needs assistance Sitting-balance support: No upper extremity supported;Feet supported Sitting balance-Leahy Scale: Good     Standing balance support: During functional activity;Single extremity supported Standing balance-Leahy Scale: Poor Standing balance comment: reliant on single UE support                           ADL either performed or assessed with  clinical judgement   ADL Overall ADL's : Needs assistance/impaired Eating/Feeding: Independent;Sitting   Grooming: Brushing hair;Set up;Sitting               Lower Body Dressing: Set up;Sitting/lateral leans Lower Body Dressing Details (indicate cue type and reason): figure 4 technique             Functional mobility during ADLs: Min guard;Cueing for safety;Cueing for sequencing General ADL Comments: Pt requiring 6L O2 throughout session with sats >84% with exertion.     Vision       Perception     Praxis      Cognition Arousal/Alertness: Awake/alert Behavior During Therapy: WFL for tasks assessed/performed Overall Cognitive Status: Within Functional Limits for tasks assessed                                 General Comments: following all commands        Exercises Exercises: Other exercises Other Exercises Other Exercises: B/L shoulder rolls, shoulder retraction and head elevation; shoulder punches at FF 90* x10 reps   Shoulder Instructions       General Comments 6L O2 continuous O2 >84% pursed lip breathing techniques to >90% ~1 minute to recover inbetween ADL and HEP.    Pertinent Vitals/ Pain       Pain Assessment: No/denies pain  Home Living  Prior Functioning/Environment              Frequency  Min 2X/week        Progress Toward Goals  OT Goals(current goals can now be found in the care plan section)  Progress towards OT goals: Progressing toward goals  Acute Rehab OT Goals Patient Stated Goal: to go home OT Goal Formulation: With patient Time For Goal Achievement: 04/18/20 Potential to Achieve Goals: Good ADL Goals Pt Will Perform Grooming: with supervision;standing Pt Will Perform Upper Body Bathing: with set-up;sitting Pt Will Perform Lower Body Bathing: with supervision;sit to/from stand Pt Will Perform Upper Body Dressing: with set-up;sitting Pt Will  Perform Lower Body Dressing: with supervision;sit to/from stand Pt Will Transfer to Toilet: with supervision;ambulating;bedside commode Pt Will Perform Toileting - Clothing Manipulation and hygiene: with supervision;sit to/from stand Additional ADL Goal #1: Pt will generalize energy conservation strategies during ADL and mobility.  Plan Discharge plan remains appropriate    Co-evaluation                 AM-PAC OT "6 Clicks" Daily Activity     Outcome Measure   Help from another person eating meals?: None Help from another person taking care of personal grooming?: A Little Help from another person toileting, which includes using toliet, bedpan, or urinal?: A Little Help from another person bathing (including washing, rinsing, drying)?: A Little Help from another person to put on and taking off regular upper body clothing?: A Little Help from another person to put on and taking off regular lower body clothing?: A Little 6 Click Score: 19    End of Session Equipment Utilized During Treatment: Oxygen  OT Visit Diagnosis: Unsteadiness on feet (R26.81);Other abnormalities of gait and mobility (R26.89);Muscle weakness (generalized) (M62.81)   Activity Tolerance Patient tolerated treatment well   Patient Left in bed;with call bell/phone within reach;with bed alarm set   Nurse Communication Mobility status        Time: [id number] OT Time Calculation (min): 26 min  Charges: OT General Charges $OT Visit: 1 Visit OT Treatments $Self Care/Home Management : 8-22 mins $Therapeutic Exercise: 8-22 mins  Jefferey Pica, OTR/L Acute Rehabilitation Services Pager: [phone] Office: [phone]    Abdinasir Spadafore C 04/10/2020, 5:30 PM

## 2020-04-10 NOTE — TOC Progression Note (Addendum)
Transition of Care [facility]) - Progression Note    Patient Details  Name: LASHAWN ORREGO MRN: [id number] Date of Birth: 06/07/1934  [facility] CM/SW Contact  Lorri Frederick, LCSW Phone Number: 04/10/2020, 1:20 PM  Clinical Narrative:   CSW spoke with MD/PT regarding change in DC recommendation to SNF, which was made by PT.  Pt sent out in hub for SNF.  1445: CSW updated pt and pt wife Claris Che, provided choice document and explained the process for bed offers and insurance authorization.     Expected Discharge Plan: Home w Home Health Services Barriers to Discharge: No Barriers Identified  Expected Discharge Plan and Services Expected Discharge Plan: Home w Home Health Services     Post Acute Care Choice: Home Health Living arrangements for the past 2 months: Single Family Home                 DME Arranged: 3-N-1,[facility] DME Agency: [employer] Date DME Agency Contacted: 04/06/20     HH Arranged: PT,OT HH Agency: [facility] Date HH Agency Contacted: 04/05/20 Time HH Agency Contacted: 1528 Representative spoke with at [facility] Agency: Cindie   Social Determinants of Health (SDOH) Interventions    Readmission Risk Interventions No flowsheet data found.

## 2020-04-10 NOTE — NC FL2 (Signed)
[place] MEDICAID FL2 LEVEL OF CARE SCREENING TOOL     IDENTIFICATION  Patient Name: Bradley Johns Birthdate: 09/24/1934 Sex: male Admission Date (Current Location): 03/31/2020  [facility] and [place] Number:  Producer, television/film/video and Address:  [facility] [place], [address], [place], [place] [address]      Provider Number: [id number]  Attending Physician Name and Address:  [place], Eric J, DO  Relative Name and Phone Number:  Vanduyn,Magaret Spouse [phone]  [phone]    Current Level of Care: Hospital Recommended Level of Care: Skilled Nursing Facility Prior Approval Number:    Date Approved/Denied:   PASRR Number: [id number]  Discharge Plan: SNF    Current Diagnoses: Patient Active Problem List   Diagnosis Date Noted  . Hypoxia   . Acute respiratory distress   . Cardiac arrest (HCC)   . Symptomatic anemia 03/31/2020  . Syncope and collapse 03/31/2020  . AKI (acute kidney injury) (HCC) 03/31/2020  . Chronic respiratory failure with hypoxia (HCC) 06/03/2018  . Anemia associated with acute blood loss 05/20/2018  . Acute GI bleeding 05/17/2018  . PCP NOTES >>>>>>>>>>>>>>>. 12/30/2014  . Pulmonary fibrosis (HCC) 12/14/2012  . Cubital tunnel syndrome on left 11/14/2012  . Dyspnea on exertion 10/11/2012  . Trigger finger of both hands 09/26/2011  . Annual physical exam 03/18/2011  . BPH (benign prostatic hyperplasia) 07/11/2008  . Osteoarthritis 04/18/2008  . Hyperlipidemia 10/06/2006  . Diabetes mellitus type 2 in nonobese (HCC) 04/29/2006  . GOUT 04/29/2006  . Essential hypertension 04/29/2006  . COPD with emphysema, on O2 04/29/2006  . Sleep apnea 04/29/2006    Orientation RESPIRATION BLADDER Height & Weight     Self,Time,Situation,Place  O2 Incontinent Weight: 178 lb 9.2 oz (81 kg) Height:  5\' 8"  (172.7 cm)  BEHAVIORAL SYMPTOMS/MOOD NEUROLOGICAL BOWEL NUTRITION STATUS      Continent Diet (heart healthy/carb modified.  See  discharge summary)  AMBULATORY STATUS COMMUNICATION OF NEEDS Skin   Supervision Verbally Other (Comment) (ecchymosis)                       Personal Care Assistance Level of Assistance  Bathing,Feeding,Dressing Bathing Assistance: Limited assistance Feeding assistance: Independent Dressing Assistance: Limited assistance     Functional Limitations Info  Sight,Hearing,Speech Sight Info: Adequate Hearing Info: Adequate Speech Info: Adequate    SPECIAL CARE FACTORS FREQUENCY  PT (By licensed PT),OT (By licensed OT)     PT Frequency: 5x week OT Frequency: 5x week            Contractures Contractures Info: Not present    Additional Factors Info  Code Status,Allergies,Insulin Sliding Scale Code Status Info: full Allergies Info: NKA   Insulin Sliding Scale Info: Novolog:0-9 units 3x day with meals.  See discharge summary.       Current Medications (04/10/2020):  This is the current hospital active medication list Current Facility-Administered Medications  Medication Dose Route Frequency Provider Last Rate Last Admin  . acetaminophen (TYLENOL) tablet 650 mg  650 mg Oral Q6H PRN 06/08/2020, MD   650 mg at 04/08/20 1049   Or  . acetaminophen (TYLENOL) suppository 650 mg  650 mg Rectal Q6H PRN 06/06/20, MD      . albuterol (PROVENTIL) (2.5 MG/3ML) 0.083% nebulizer solution 2.5 mg  2.5 mg Nebulization Q4H PRN Jonah Blue B, NP      . aspirin EC tablet 81 mg  81 mg Oral Daily Selmer Dominion, MD   [phone]  mg at 04/10/20 0904  . atorvastatin (LIPITOR) tablet 80 mg  80 mg Oral QPM Karmen Bongo, MD   80 mg at 04/09/20 1649  . carvedilol (COREG) tablet 3.125 mg  3.125 mg Oral BID WC Hall, Carole N, DO   3.125 mg at 04/10/20 0903  . Chlorhexidine Gluconate Cloth 2 % PADS 6 each  6 each Topical Daily Darliss Cheney, MD   6 each at 04/10/20 0904  . doxazosin (CARDURA) tablet 4 mg  4 mg Oral QHS Karmen Bongo, MD   4 mg at 04/09/20 2145  . guaiFENesin (MUCINEX) 12 hr  tablet 600 mg  600 mg Oral BID Jennelle Human B, NP   600 mg at 04/10/20 0904  . heparin injection 5,000 Units  5,000 Units Subcutaneous Q8H Kayleen Memos, DO   5,000 Units at 04/10/20 [id number]  . hydrALAZINE (APRESOLINE) injection 10 mg  10 mg Intravenous Q6H PRN Darliss Cheney, MD   10 mg at 04/03/20 0831  . insulin aspart (novoLOG) injection 0-5 Units  0-5 Units Subcutaneous QHS Jennelle Human B, NP      . insulin aspart (novoLOG) injection 0-9 Units  0-9 Units Subcutaneous TID WC Jennelle Human B, NP   1 Units at 04/09/20 1649  . MEDLINE mouth rinse  15 mL Mouth Rinse BID Darliss Cheney, MD   15 mL at 04/10/20 0904  . menthol-cetylpyridinium (CEPACOL) lozenge 3 mg  1 lozenge Oral PRN Zierle-Ghosh, Asia B, DO   3 mg at 04/02/20 2149  . ondansetron (ZOFRAN) tablet 4 mg  4 mg Oral Q6H PRN Karmen Bongo, MD       Or  . ondansetron [facility]) injection 4 mg  4 mg Intravenous Q6H PRN Karmen Bongo, MD      . oxymetazoline (AFRIN) 0.05 % nasal spray 1 spray  1 spray Each Nare BID [place] Eric J, DO   1 spray at 04/10/20 1235  . senna (SENOKOT) tablet 17.2 mg  2 tablet Oral Daily Irene Pap N, DO   17.2 mg at 04/10/20 [id number]  . sodium chloride (OCEAN) 0.65 % nasal spray 1 spray  1 spray Each Nare PRN Zierle-Ghosh, Asia B, DO   1 spray at 04/02/20 2150  . sodium chloride flush (NS) 0.9 % injection 3 mL  3 mL Intravenous Q12H Karmen Bongo, MD   3 mL at 04/10/20 [id number]     Discharge Medications: Please see discharge summary for a list of discharge medications.  Relevant Imaging Results:  Relevant Lab Results:   Additional Information SSN [id number]  Joanne Chars, LCSW

## 2020-04-10 NOTE — Progress Notes (Signed)
PROGRESS NOTE    Bradley Johns  [id number] DOB: December 08, 1934 DOA: 03/31/2020 PCP: Colon Branch, MD    Brief Narrative:  Bradley Johns is an 86 year old male with past medical history significant for pulmonary fibrosis/COPD on 3-5 L nasal cannula at home, OSA, essential hypertension, type 2 diabetes mellitus, BPH who presented to [name] ED following syncopal episode at home on 03/31/2020.  Upon arrival to the ED, he was found to be dyspneic with a hemoglobin of 5.  Hemoccult negative.  Patient with some vague complaints of intermittent hematuria and he was subsequently transfused 2 unit PVCs and admitted to the hospitalist service.  He became increasingly more dyspneic with worsening hypoxia, VQ scan was performed and was low risk for pulmonary embolism.  On 04/02/2020, oxygen requirement significantly increased with concern for pulmonary edema; he was started on IV Lasix and Solu-Medrol.  In the a.m. of 04/03/2020, central telemetry notified his primary nurse of rhythm change in which she was found to be bradycardic and shortly thereafter he lost his pulse and underwent PEA arrest.  Patient was successfully resuscitated with less than 1 round of CPR prior to achieving ROSC.  Postarrest he was awake and alert and transferred to the ICU for closer monitoring.  Patient was transferred from [facility] back to the Hospitalist service on 04/05/2020.   Assessment & Plan:   Principal Problem:   Symptomatic anemia Active Problems:   Diabetes mellitus type 2 in nonobese (HCC)   Hyperlipidemia   Essential hypertension   COPD with emphysema, on O2   BPH (benign prostatic hyperplasia)   Dyspnea on exertion   Pulmonary fibrosis (HCC)   Chronic respiratory failure with hypoxia (HCC)   Syncope and collapse   AKI (acute kidney injury) ([place])   Hypoxia   Acute respiratory distress   Cardiac arrest (HCC)   Acute on chronic hypoxic/hypercarbic respiratory failure, POA PEA arrest Pulmonary  edema Interstitial pulmonary fibrosis/COPD Patient follows with pulmonology outpatient, Dr. Elsworth Soho.  Baseline oxygen requirement 3-5 L per nasal cannula. Was plan to start OFEV, but didn't follow-up for needed lab tests and was nervous to be seen in pulm office due to COVID-19 pandemic.  Patient with increased oxygen requirements during initial hospitalization and subsequently went into PEA arrest with ROSC achieved with less than 1 round of CPR and was alert and oriented following resuscitative efforts.  She was also noted to have pulmonary edema and was given aggressive IV diuresis with furosemide --IV diuretics discontinued on 04/06/2020 due to euvolemia and worsening renal function --Continue supplemental oxygen, maintain SPO2 between 88-94%, currently on 6 L nasal cannula but requiring up to 25 L with ambulation --Albuterol neb every 4 hours as needed wheezing --Mucinex 600 mg p.o. twice daily --Incentive spirometry/flutter valve --Strict I's and O's and daily weights  Acute on chronic diastolic congestive heart failure TTE 04/01/2020 with LVEF 60-65%, no regional LV wall motion abnormalities, concentric LVH, mildly elevated PAP, LA mildly dilated, mild MR, moderate TR, IVC dilated.  Chest x-ray 04/03/2020 with cardiomegaly, interstitial edema. --Received aggressive IV diuresis while inpatient, now discontinued due to elevated creatinine --net negative 859mL past 24h and net negative 9.9L since admission --strict I's and O's and daily weights  AKI on CKD stage IIIb Etiology likely prerenal in the setting of high-dose IV diuresis for pulmonary edema as above.  Baseline creatinine 1.4 with a GFR 49. --Cr 1.88>>1.43>1.646>1.72>2.28>1.97 --wt 86.2kg>81kg today --Diuretics discontinued on 04/06/2020 --Avoid nephrotoxins, renally dose all medications --Continue to closely monitor  urine output --Follow BMP daily  Essential hypertension On losartan and Norvasc at home. --Hold home  antihypertensives --BP 116/52 this morning, well controlled --Coreg 3.125 mg p.o. twice daily --Continue monitor blood pressure closely  Iron deficiency anemia Patient presenting with symptomatic anemia with a hemoglobin of 5.0 on admission.  Iron 20, TIBC 405, ferritin 11, folate 19.3, B12.  Transfused 2 unit PRBCs on 03/31/2020.  Received IV Feraheme on 12/28 and 12/31. --Hgb 5.0>8.0>7.6>7.1>>8.3 --repeat hemoglobin in am   Type 2 diabetes mellitus Hemoglobin A1c 5.7, 03/31/2020; well controlled. --Insulin sliding scale for coverage --CBGs before every meal/at bedtime  Pulmonary nodule Incidental finding of 10 mm irregular right apical pulmonary nodule on CT C-spine on admission. --Follow-up CT chest without contrast in 3 months to ensure stability is consideration of PET-CT for further evaluation  HLD: Atorvastatin 80 mg p.o. daily  Weakness, debility, deconditioning Patient with significant weakness and deconditioning.  Requires 6 L nasal cannula at rest to maintain SPO2 with increased oxygen requirement of 25 L with exertion.  Patient with multiple falls over the past several months at home, even down his steps.  Spouse reports that she has a two-level home with all the bedrooms upstairs and only a half bath downstairs.  She is very concerned about his multiple falls and his weakness about returning home. --Pending SNF placement   DVT prophylaxis: Heparin Code Status: Full code Family Communication: Updated patients spouse via telephone this afternoon  Disposition Plan:  Status is: Inpatient  Remains inpatient appropriate because:Unsafe d/c plan, IV treatments appropriate due to intensity of illness or inability to take PO and Inpatient level of care appropriate due to severity of illness   Dispo:  Patient From: Home  Planned Disposition: [place]  Expected discharge date: 04/13/2020  Medically stable for discharge: No    Consultants:   PCCM: signed off  04/05/2020  Procedures:   VQ scan 12/26: no evidence of PE  TTE 12/26: LVEF 60-65%, no regional wall motion abnormalities  Antimicrobials:   None  Significant Hospital Events:  12/25 admit, anemic, 2 units PRBC.  12/27 worsening O2 requirements  12/28 PEA arrest tx to ICU. Started on BiPAP   Subjective: Patient seen and examined bedside, resting comfortably.  Continues with significant exertional dyspnea even with mild movements.  Continues on 6 L nasal cannula.  Reports his condom catheter came off and his bedding was wet; requesting assistance with bed change/new close.  Discussed with patient his significant O2 requirements with ambulation, up to 25 L yesterday and will likely need to go to rehab prior to going home as it is not safe.  Patient understands and is appreciative all the care he is received.  No other questions or concerns at this time.  Denies headache, no fever/chills/night sweats, no nausea/vomiting/diarrhea, no chest pain, no palpitations, no abdominal pain, no paresthesias.  No acute events overnight per nursing staff.  Objective: Vitals:   04/09/20 1919 04/10/20 0504 04/10/20 0615 04/10/20 0800  BP: (!) 144/56 (!) 116/52    Pulse: 60 63    Resp: 18 18    Temp: 98.1 F (36.7 C) 97.8 F (36.6 C)    TempSrc: Axillary Oral    SpO2: 92% 98%  95%  Weight:   81 kg   Height:        Intake/Output Summary (Last 24 hours) at 04/10/2020 1331 Last data filed at 04/10/2020 0324 Gross per 24 hour  Intake --  Output 895 ml  Net -895 ml  Filed Weights   04/04/20 0400 04/06/20 0347 04/10/20 0615  Weight: 80.6 kg 76.3 kg 81 kg    Examination:  General exam: Appears calm and comfortable, ill in appearance Respiratory system: Decreased breath sounds bilateral bases, otherwise clear to auscultation, normal respiratory effort without accessory muscle use, on 6 L nasal cannula with SPO2 98% (required 25L w/ ambulation yesterday with SPO2 maintaining at  86%) Cardiovascular system: S1 & S2 heard, RRR. No JVD, murmurs, rubs, gallops or clicks. No pedal edema. Gastrointestinal system: Abdomen is nondistended, soft and nontender. No organomegaly or masses felt. Normal bowel sounds heard. Central nervous system: Alert and oriented. No focal neurological deficits. Extremities: Symmetric 5 x 5 power. Skin: No rashes, lesions or ulcers Psychiatry: Judgement and insight appear poor. Mood & affect appropriate.     Data Reviewed: I have personally reviewed following labs and imaging studies  CBC: Recent Labs  Lab 04/07/20 0109  WBC 9.2  NEUTROABS 6.0  HGB 8.3*  HCT 27.8*  MCV 84.8  PLT 233   Basic Metabolic Panel: Recent Labs  Lab 04/04/20 0047 04/05/20 0238 04/06/20 0146 04/07/20 0109 04/08/20 0554  NA 141 139 138 137 137  K 3.5 4.1 3.9 4.2 3.8  CL 99 94* 91* 92* 93*  CO2 31 34* 33* 34* 33*  GLUCOSE 110* 102* 135* 112* 142*  BUN 27* 33* 33* 47* 52*  CREATININE 1.43* 1.64* 1.72* 2.28* 1.97*  CALCIUM 8.8* 9.0 8.8* 8.6* 8.3*  MG  --  1.6*  --   --  2.1  PHOS  --  2.7  --  4.5  --    GFR: Estimated Creatinine Clearance: 27.5 mL/min (A) (by C-G formula based on SCr of 1.97 mg/dL (H)). Liver Function Tests: Recent Labs  Lab 04/07/20 0109  ALBUMIN 2.6*   No results for input(s): LIPASE, AMYLASE in the last 168 hours. No results for input(s): AMMONIA in the last 168 hours. Coagulation Profile: No results for input(s): INR, PROTIME in the last 168 hours. Cardiac Enzymes: No results for input(s): CKTOTAL, CKMB, CKMBINDEX, TROPONINI in the last 168 hours. BNP (last 3 results) No results for input(s): PROBNP in the last 8760 hours. HbA1C: No results for input(s): HGBA1C in the last 72 hours. CBG: Recent Labs  Lab 04/09/20 1548 04/09/20 2017 04/09/20 2143 04/10/20 0800 04/10/20 1155  GLUCAP 122* 91 74 111* 103*   Lipid Profile: No results for input(s): CHOL, HDL, LDLCALC, TRIG, CHOLHDL, LDLDIRECT in the last 72  hours. Thyroid Function Tests: No results for input(s): TSH, T4TOTAL, FREET4, T3FREE, THYROIDAB in the last 72 hours. Anemia Panel: No results for input(s): VITAMINB12, FOLATE, FERRITIN, TIBC, IRON, RETICCTPCT in the last 72 hours. Sepsis Labs: No results for input(s): PROCALCITON, LATICACIDVEN in the last 168 hours.  Recent Results (from the past 240 hour(s))  MRSA PCR Screening     Status: None   Collection Time: 04/03/20  7:07 AM   Specimen: Nasopharyngeal  Result Value Ref Range Status   MRSA by PCR NEGATIVE NEGATIVE Final    Comment:        The GeneXpert MRSA Assay (FDA approved for NASAL specimens only), is one component of a comprehensive MRSA colonization surveillance program. It is not intended to diagnose MRSA infection nor to guide or monitor treatment for MRSA infections. Performed at [facility], [address] [place], [place] [address]          Radiology Studies: No results found.      Scheduled Meds: .  aspirin EC  81 mg Oral Daily  . atorvastatin  80 mg Oral QPM  . carvedilol  3.125 mg Oral BID WC  . Chlorhexidine Gluconate Cloth  6 each Topical Daily  . doxazosin  4 mg Oral QHS  . guaiFENesin  600 mg Oral BID  . heparin injection (subcutaneous)  5,000 Units Subcutaneous Q8H  . insulin aspart  0-5 Units Subcutaneous QHS  . insulin aspart  0-9 Units Subcutaneous TID WC  . mouth rinse  15 mL Mouth Rinse BID  . oxymetazoline  1 spray Each Nare BID  . senna  2 tablet Oral Daily  . sodium chloride flush  3 mL Intravenous Q12H   Continuous Infusions:   LOS: 10 days    Time spent: 41 minutes spent on chart review, discussion with nursing staff, consultants, updating family and interview/physical exam; more than 50% of that time was spent in counseling and/or coordination of care.    Calib Wadhwa J [place] DO [facility] Available via Epic secure chat 7am-7pm After these hours, please refer to coverage provider listed on  [email or web address] 04/10/2020, 1:31 PM

## 2020-04-10 NOTE — Progress Notes (Signed)
Physical Therapy Treatment Patient Details Name: Bradley Johns MRN: [id number] DOB: 02-25-1935 Today's Date: 04/10/2020    History of Present Illness Bradley Johns is a 86 y.o. admitted with syncope. Pt with age indeterminate nasal fx. Worsening hypoxemia 12/27. 12/28 with PEA arrest and transfer to ICU on bipap. PMHx:OSA; HTN; HLD; DM; pulmonary fibrosis/COPD on home O2; and BPH    PT Comments    Pt received in recliner, eager to participate in therapy. Pt performed LE exercises in recliner with feet elevated prior to mobility. He required min assist sit <> stand and min guard assist ambulation 44' with RW. Pt on 6L continuous O2 during session. SpO2 94% at rest. Desat to 88% during exercises. Desat to 80% during amb. 3-5 minute recovery time, seated in recliner after amb, to return to 90%. 2/4 DOE. Pt returned to recliner with feet elevated at end of session. Due to continued high O2 needs and poor activity tolerance, discharge recommendation updated to SNF.   Follow Up Recommendations  SNF;Supervision/Assistance - 24 hour     Equipment Recommendations  3in1 (PT);Other (comment) (rollator)    Recommendations for Other Services       Precautions / Restrictions Precautions Precautions: Fall;Other (comment) Precaution Comments: watch sats    Mobility  Bed Mobility               General bed mobility comments: Pt received in recliner and returned to recliner.  Transfers Overall transfer level: Needs assistance Equipment used: Rolling walker (2 wheeled) Transfers: Sit to/from Stand Sit to Stand: Min assist Stand pivot transfers: Min guard       General transfer comment: min assist to power up from recliner, cues for hand placement  Ambulation/Gait Ambulation/Gait assistance: Min guard Gait Distance (Feet): 35 Feet Assistive device: Rolling walker (2 wheeled) Gait Pattern/deviations: Step-through pattern;Decreased stride length Gait velocity: decreased Gait  velocity interpretation: <1.31 ft/sec, indicative of household ambulator General Gait Details: Min guard assist for safety and lines. Amb on 6L with desat to 80%. 2/4 DOE noted. 3-5 minute seated recovery time to return to 90%.   Stairs             Wheelchair Mobility    Modified Rankin (Stroke Patients Only)       Balance Overall balance assessment: Needs assistance Sitting-balance support: No upper extremity supported;Feet supported Sitting balance-Leahy Scale: Good     Standing balance support: Bilateral upper extremity supported;During functional activity Standing balance-Leahy Scale: Poor Standing balance comment: reliant on BUE support                            Cognition Arousal/Alertness: Awake/alert Behavior During Therapy: WFL for tasks assessed/performed Overall Cognitive Status: Within Functional Limits for tasks assessed                                 General Comments: mild confusion noted but Bradley Johns for basic tasks      Exercises General Exercises - Lower Extremity Ankle Circles/Pumps: AROM;Both;10 reps Heel Slides: AROM;Right;Left;10 reps Hip ABduction/ADduction: AROM;Right;Left;10 reps    General Comments General comments (skin integrity, edema, etc.): Pt on 6L continuous O2 during session. SpO2 94% at rest. Desat to 88% during LE exercises. Desat to 80% during amb.      Pertinent Vitals/Pain Pain Assessment: No/denies pain    Home Living  Prior Function            PT Goals (current goals can now be found in the care plan section) Acute Rehab PT Goals Patient Stated Goal: to go home Progress towards PT goals: Progressing toward goals    Frequency    Min 3X/week      PT Plan Discharge plan needs to be updated    Co-evaluation              AM-PAC PT "6 Clicks" Mobility   Outcome Measure  Help needed turning from your back to your side while in a flat bed without using  bedrails?: None Help needed moving from lying on your back to sitting on the side of a flat bed without using bedrails?: None Help needed moving to and from a bed to a chair (including a wheelchair)?: A Little Help needed standing up from a chair using your arms (e.g., wheelchair or bedside chair)?: A Little Help needed to walk in Johns room?: A Little Help needed climbing 3-5 steps with a railing? : A Lot 6 Click Score: 19    End of Session Equipment Utilized During Treatment: Oxygen Activity Tolerance: Patient tolerated treatment well Patient left: in chair;with call bell/phone within reach Nurse Communication: Mobility status PT Visit Diagnosis: Unsteadiness on feet (R26.81);Muscle weakness (generalized) (M62.81)     Time: [id number] PT Time Calculation (min) (ACUTE ONLY): 24 min  Charges:  $Gait Training: 8-22 mins $Therapeutic Exercise: 8-22 mins                     Lorrin Goodell, PT  Office # [phone] Pager [phone]    Lorriane Shire 04/10/2020, 12:22 PM

## 2020-04-11 DIAGNOSIS — D649 Anemia, unspecified: Secondary | ICD-10-CM | POA: Diagnosis not present

## 2020-04-11 LAB — BASIC METABOLIC PANEL
Anion gap: 5 (ref 5–15)
BUN: 34 mg/dL — ABNORMAL HIGH (ref 8–23)
CO2: 32 mmol/L (ref 22–32)
Calcium: 8.5 mg/dL — ABNORMAL LOW (ref 8.9–10.3)
Chloride: 100 mmol/L (ref 98–111)
Creatinine, Ser: 1.36 mg/dL — ABNORMAL HIGH (ref 0.61–1.24)
GFR, Estimated: 52 mL/min — ABNORMAL LOW (ref >60)
Glucose, Bld: 122 mg/dL — ABNORMAL HIGH (ref 70–99)
Potassium: 4.6 mmol/L (ref 3.5–5.1)
Sodium: 137 mmol/L (ref 135–145)

## 2020-04-11 LAB — CBC
HCT: 26.2 % — ABNORMAL LOW (ref 39.0–52.0)
Hemoglobin: 7.8 g/dL — ABNORMAL LOW (ref 13.0–17.0)
MCH: 26.4 pg (ref 26.0–34.0)
MCHC: 29.8 g/dL — ABNORMAL LOW (ref 30.0–36.0)
MCV: 88.5 fL (ref 80.0–100.0)
Platelets: 216 10*3/uL (ref 150–400)
RBC: 2.96 MIL/uL — ABNORMAL LOW (ref 4.22–5.81)
RDW: 21.2 % — ABNORMAL HIGH (ref 11.5–15.5)
WBC: 6.7 10*3/uL (ref 4.0–10.5)
nRBC: 0.4 % — ABNORMAL HIGH (ref 0.0–0.2)

## 2020-04-11 LAB — GLUCOSE, CAPILLARY
Glucose-Capillary: 99 mg/dL (ref 70–99)
Glucose-Capillary: 149 mg/dL — ABNORMAL HIGH (ref 70–99)
Glucose-Capillary: 82 mg/dL (ref 70–99)
Glucose-Capillary: 153 mg/dL — ABNORMAL HIGH (ref 70–99)

## 2020-04-11 MED ORDER — AYR SALINE NASAL NA GEL
1.0000 "application " | Freq: Two times a day (BID) | NASAL | Status: DC
  Administered 2020-04-11 – 2020-04-13 (×5): 1 via NASAL
  Filled 2020-04-11 (×2): qty 14.1

## 2020-04-11 NOTE — Progress Notes (Signed)
PROGRESS NOTE    EVER COLOMBE  [id number] DOB: 1934-06-04 DOA: 03/31/2020 PCP: Colon Branch, MD    Brief Narrative:  LLIAM MARELLA is an 86 year old male with past medical history significant for pulmonary fibrosis/COPD on 3-5 L nasal cannula at home, OSA, essential hypertension, type 2 diabetes mellitus, BPH who presented to [name] ED following syncopal episode at home on 03/31/2020.  Upon arrival to the ED, he was found to be dyspneic with a hemoglobin of 5.  Hemoccult negative.  Patient with some vague complaints of intermittent hematuria and he was subsequently transfused 2 unit PVCs and admitted to the hospitalist service.  He became increasingly more dyspneic with worsening hypoxia, VQ scan was performed and was low risk for pulmonary embolism.  On 04/02/2020, oxygen requirement significantly increased with concern for pulmonary edema; he was started on IV Lasix and Solu-Medrol.  In the a.m. of 04/03/2020, central telemetry notified his primary nurse of rhythm change in which she was found to be bradycardic and shortly thereafter he lost his pulse and underwent PEA arrest.  Patient was successfully resuscitated with less than 1 round of CPR prior to achieving ROSC.  Postarrest he was awake and alert and transferred to the ICU for closer monitoring.  Patient was transferred from [facility] back to the Hospitalist service on 04/05/2020.   Assessment & Plan:   Principal Problem:   Symptomatic anemia Active Problems:   Diabetes mellitus type 2 in nonobese (HCC)   Hyperlipidemia   Essential hypertension   COPD with emphysema, on O2   BPH (benign prostatic hyperplasia)   Dyspnea on exertion   Pulmonary fibrosis (HCC)   Chronic respiratory failure with hypoxia (HCC)   Syncope and collapse   AKI (acute kidney injury) ([place])   Hypoxia   Acute respiratory distress   Cardiac arrest (HCC)   Acute on chronic hypoxic/hypercarbic respiratory failure, POA PEA arrest Pulmonary  edema Interstitial pulmonary fibrosis/COPD Patient follows with pulmonology outpatient, Dr. Elsworth Soho.  Baseline oxygen requirement 3-5 L per nasal cannula. Was plan to start OFEV, but didn't follow-up for needed lab tests and was nervous to be seen in pulm office due to COVID-19 pandemic.  Patient with increased oxygen requirements during initial hospitalization and subsequently went into PEA arrest with ROSC achieved with less than 1 round of CPR and was alert and oriented following resuscitative efforts.  She was also noted to have pulmonary edema and was given aggressive IV diuresis with furosemide --IV diuretics discontinued on 04/06/2020 due to euvolemia and worsening renal function --Continue supplemental oxygen, maintain SPO2 between 88-94%, currently on 5 L nasal cannula with SPO2 97% --Albuterol neb every 4 hours as needed wheezing --Mucinex 600 mg p.o. twice daily --Incentive spirometry/flutter valve --Strict I's and O's and daily weights  Acute on chronic diastolic congestive heart failure TTE 04/01/2020 with LVEF 60-65%, no regional LV wall motion abnormalities, concentric LVH, mildly elevated PAP, LA mildly dilated, mild MR, moderate TR, IVC dilated.  Chest x-ray 04/03/2020 with cardiomegaly, interstitial edema. --Received aggressive IV diuresis while inpatient, now discontinued due to elevated creatinine --net negative 1.3L past 24h and net negative 11.2L since admission --strict I's and O's and daily weights  AKI on CKD stage IIIb Etiology likely prerenal in the setting of high-dose IV diuresis for pulmonary edema as above.  Baseline creatinine 1.4 with a GFR 49. --Cr 1.88>>1.43>1.646>1.72>2.28>1.97>1.36 --wt 86.2kg>81kg>79.7kg today --Diuretics discontinued on 04/06/2020 --Avoid nephrotoxins, renally dose all medications --Continue to closely monitor urine output --Follow BMP daily  Essential hypertension On losartan and Norvasc at home. --Hold home antihypertensives --BP  139/45 this morning, well controlled --Coreg 3.125 mg p.o. twice daily --Continue monitor blood pressure closely  Iron deficiency anemia Patient presenting with symptomatic anemia with a hemoglobin of 5.0 on admission.  Iron 20, TIBC 405, ferritin 11, folate 19.3, B12.  Transfused 2 unit PRBCs on 03/31/2020.  Received IV Feraheme on 12/28 and 12/31. --Hgb 5.0>8.0>7.6>7.1>>8.3>7.8 --ferrous sulfate 325mg  PO daily  Type 2 diabetes mellitus Hemoglobin A1c 5.7, 03/31/2020; well controlled. --Insulin sliding scale for coverage --CBGs before every meal/at bedtime  Pulmonary nodule Incidental finding of 10 mm irregular right apical pulmonary nodule on CT C-spine on admission. --Follow-up CT chest without contrast in 3 months to ensure stability is consideration of PET-CT for further evaluation  HLD: Atorvastatin 80 mg p.o. daily  Weakness, debility, deconditioning Patient with significant weakness and deconditioning.  Requires 6 L nasal cannula at rest to maintain SPO2 with increased oxygen requirement of 25 L with exertion.  Patient with multiple falls over the past several months at home, even down his steps.  Spouse reports that she has a two-level home with all the bedrooms upstairs and only a half bath downstairs.  She is very concerned about his multiple falls and his weakness about returning home. --Pending SNF placement   DVT prophylaxis: Heparin Code Status: Full code Family Communication: Updated patients spouse, Magaret via telephone this afternoon  Disposition Plan:  Status is: Inpatient  Remains inpatient appropriate because:Unsafe d/c plan, IV treatments appropriate due to intensity of illness or inability to take PO and Inpatient level of care appropriate due to severity of illness   Dispo:  Patient From: Home  Planned Disposition: [place]  Expected discharge date: 04/12/2020  Medically stable for discharge: Yes    Consultants:   PCCM: signed off  04/05/2020  Procedures:   VQ scan 12/26: no evidence of PE  TTE 12/26: LVEF 60-65%, no regional wall motion abnormalities  Antimicrobials:   None  Significant Hospital Events:  12/25 admit, anemic, 2 units PRBC.  12/27 worsening O2 requirements  12/28 PEA arrest tx to ICU. Started on BiPAP   Subjective: Patient seen and examined bedside, resting comfortably.  Currently on 7 L nasal cannula with SPO2 100% monitor; turned down oxygen to 5 L nasal cannula and remained 97%.  Continues with shortness of breath with minimal exertion.  Waiting SNF placement. No other questions or concerns at this time.  Denies headache, no fever/chills/night sweats, no nausea/vomiting/diarrhea, no chest pain, no palpitations, no abdominal pain, no paresthesias.  No acute events overnight per nursing staff.  Objective: Vitals:   04/10/20 2009 04/11/20 0504 04/11/20 0526 04/11/20 0945  BP: (!) 155/51 (!) 150/43  (!) 139/45  Pulse: 88 75  76  Resp: 20 20    Temp: 97.8 F (36.6 C) 98 F (36.7 C)    TempSrc: Oral Oral    SpO2: 93% 100%    Weight:   79.7 kg   Height:        Intake/Output Summary (Last 24 hours) at 04/11/2020 1159 Last data filed at 04/11/2020 0953 Gross per 24 hour  Intake 3 ml  Output 1325 ml  Net -1322 ml   Filed Weights   04/06/20 0347 04/10/20 0615 04/11/20 0526  Weight: 76.3 kg 81 kg 79.7 kg    Examination:  General exam: Appears calm and comfortable, ill in appearance Respiratory system: Decreased breath sounds bilateral bases, otherwise clear to auscultation, normal respiratory effort  without accessory muscle use, on 5 L nasal cannula with SPO2 97% (ambulation yesterday with SPO2 maintaining at >84% on 6L) Cardiovascular system: S1 & S2 heard, RRR. No JVD, murmurs, rubs, gallops or clicks. No pedal edema. Gastrointestinal system: Abdomen is nondistended, soft and nontender. No organomegaly or masses felt. Normal bowel sounds heard. Central nervous system: Alert and  oriented. No focal neurological deficits. Extremities: Symmetric 5 x 5 power. Skin: No rashes, lesions or ulcers Psychiatry: Judgement and insight appear poor. Mood & affect appropriate.     Data Reviewed: I have personally reviewed following labs and imaging studies  CBC: Recent Labs  Lab 04/07/20 0109 04/11/20 0318  WBC 9.2 6.7  NEUTROABS 6.0  --   HGB 8.3* 7.8*  HCT 27.8* 26.2*  MCV 84.8 88.5  PLT 233 [id number]   Basic Metabolic Panel: Recent Labs  Lab 04/05/20 0238 04/06/20 0146 04/07/20 0109 04/08/20 0554 04/11/20 0318  NA 139 138 137 137 137  K 4.1 3.9 4.2 3.8 4.6  CL 94* 91* 92* 93* 100  CO2 34* 33* 34* 33* 32  GLUCOSE 102* 135* 112* 142* 122*  BUN 33* 33* 47* 52* 34*  CREATININE 1.64* 1.72* 2.28* 1.97* 1.36*  CALCIUM 9.0 8.8* 8.6* 8.3* 8.5*  MG 1.6*  --   --  2.1  --   PHOS 2.7  --  4.5  --   --    GFR: Estimated Creatinine Clearance: 39.8 mL/min (A) (by C-G formula based on SCr of 1.36 mg/dL (H)). Liver Function Tests: Recent Labs  Lab 04/07/20 0109  ALBUMIN 2.6*   No results for input(s): LIPASE, AMYLASE in the last 168 hours. No results for input(s): AMMONIA in the last 168 hours. Coagulation Profile: No results for input(s): INR, PROTIME in the last 168 hours. Cardiac Enzymes: No results for input(s): CKTOTAL, CKMB, CKMBINDEX, TROPONINI in the last 168 hours. BNP (last 3 results) No results for input(s): PROBNP in the last 8760 hours. HbA1C: No results for input(s): HGBA1C in the last 72 hours. CBG: Recent Labs  Lab 04/10/20 1155 04/10/20 1524 04/10/20 2013 04/11/20 0753 04/11/20 1149  GLUCAP 103* 175* 124* 99 149*   Lipid Profile: No results for input(s): CHOL, HDL, LDLCALC, TRIG, CHOLHDL, LDLDIRECT in the last 72 hours. Thyroid Function Tests: No results for input(s): TSH, T4TOTAL, FREET4, T3FREE, THYROIDAB in the last 72 hours. Anemia Panel: No results for input(s): VITAMINB12, FOLATE, FERRITIN, TIBC, IRON, RETICCTPCT in the last 72  hours. Sepsis Labs: No results for input(s): PROCALCITON, LATICACIDVEN in the last 168 hours.  Recent Results (from the past 240 hour(s))  MRSA PCR Screening     Status: None   Collection Time: 04/03/20  7:07 AM   Specimen: Nasopharyngeal  Result Value Ref Range Status   MRSA by PCR NEGATIVE NEGATIVE Final    Comment:        The GeneXpert MRSA Assay (FDA approved for NASAL specimens only), is one component of a comprehensive MRSA colonization surveillance program. It is not intended to diagnose MRSA infection nor to guide or monitor treatment for MRSA infections. Performed at [facility], [place] [address] [place], [place] [address]          Radiology Studies: No results found.      Scheduled Meds: . aspirin EC  81 mg Oral Daily  . atorvastatin  80 mg Oral QPM  . carvedilol  3.125 mg Oral BID WC  . Chlorhexidine Gluconate Cloth  6 each Topical Daily  . doxazosin  4 mg Oral QHS  . ferrous sulfate  325 mg Oral Q breakfast  . guaiFENesin  600 mg Oral BID  . heparin injection (subcutaneous)  5,000 Units Subcutaneous Q8H  . insulin aspart  0-5 Units Subcutaneous QHS  . insulin aspart  0-9 Units Subcutaneous TID WC  . mouth rinse  15 mL Mouth Rinse BID  . oxymetazoline  1 spray Each Nare BID  . saline  1 application Each Nare Q12H  . senna  2 tablet Oral Daily  . sodium chloride flush  3 mL Intravenous Q12H   Continuous Infusions:   LOS: 11 days    Time spent: 38 minutes spent on chart review, discussion with nursing staff, consultants, updating family and interview/physical exam; more than 50% of that time was spent in counseling and/or coordination of care.    Alvira Philips [place], DO [facility] Available via Epic secure chat 7am-7pm After these hours, please refer to coverage provider listed on [email or web address] 04/11/2020, 11:59 AM

## 2020-04-11 NOTE — TOC Progression Note (Addendum)
Transition of Care [facility]) - Progression Note    Patient Details  Name: Bradley Johns MRN: [id number] Date of Birth: [birth year]  Transition of Care [facility] CM/SW Contact  Lorri Frederick, LCSW Phone Number: 04/11/2020, 8:57 AM  Clinical Narrative:   CSW spoke with [employer]--this pt's [employer] Medicare policy is not managed by [employer].  CSW spoke with wife Bradley Johns, provided bed offers: [facility], [facility], [facility].  Wife will review.    Expected Discharge Plan: Home w Home Health Services Barriers to Discharge: No Barriers Identified  Expected Discharge Plan and Services Expected Discharge Plan: Home w Home Health Services     Post Acute Care Choice: Home Health Living arrangements for the past 2 months: Single Family Home                 DME Arranged: 3-N-1,[place] DME Agency: [employer] Date DME Agency Contacted: 04/06/20     HH Arranged: PT,OT HH Agency: [facility] Date HH Agency Contacted: 04/05/20 Time HH Agency Contacted: 1528 Representative spoke with at [facility] Agency: Cindie   Social Determinants of Health (SDOH) Interventions    Readmission Risk Interventions No flowsheet data found.

## 2020-04-12 DIAGNOSIS — D649 Anemia, unspecified: Secondary | ICD-10-CM | POA: Diagnosis not present

## 2020-04-12 LAB — GLUCOSE, CAPILLARY
Glucose-Capillary: 157 mg/dL — ABNORMAL HIGH (ref 70–99)
Glucose-Capillary: 81 mg/dL (ref 70–99)
Glucose-Capillary: 126 mg/dL — ABNORMAL HIGH (ref 70–99)
Glucose-Capillary: 217 mg/dL — ABNORMAL HIGH (ref 70–99)

## 2020-04-12 MED ORDER — PREDNISONE 20 MG PO TABS
40.0000 mg | ORAL_TABLET | Freq: Every day | ORAL | Status: DC
  Administered 2020-04-12 – 2020-04-13 (×2): 40 mg via ORAL
  Filled 2020-04-12 (×2): qty 2

## 2020-04-12 NOTE — Progress Notes (Signed)
PROGRESS NOTE    Bradley Johns  [id number] DOB: [birth year] DOA: 03/31/2020 PCP: Colon Branch, MD    Brief Narrative:  Bradley Johns is an 86 year old male with past medical history significant for pulmonary fibrosis/COPD on 3-5 L nasal cannula at home, OSA, essential hypertension, type 2 diabetes mellitus, BPH who presented to [name] ED following syncopal episode at home on 03/31/2020.  Upon arrival to the ED, he was found to be dyspneic with a hemoglobin of 5.  Hemoccult negative.  Patient with some vague complaints of intermittent hematuria and he was subsequently transfused 2 unit PVCs and admitted to the hospitalist service.  He became increasingly more dyspneic with worsening hypoxia, VQ scan was performed and was low risk for pulmonary embolism.  On 04/02/2020, oxygen requirement significantly increased with concern for pulmonary edema; he was started on IV Lasix and Solu-Medrol.  In the a.m. of 04/03/2020, central telemetry notified his primary nurse of rhythm change in which she was found to be bradycardic and shortly thereafter he lost his pulse and underwent PEA arrest.  Patient was successfully resuscitated with less than 1 round of CPR prior to achieving ROSC.  Postarrest he was awake and alert and transferred to the ICU for closer monitoring.  Patient was transferred from [facility] back to the Hospitalist service on 04/05/2020.   Assessment & Plan:   Principal Problem:   Symptomatic anemia Active Problems:   Diabetes mellitus type 2 in nonobese (HCC)   Hyperlipidemia   Essential hypertension   COPD with emphysema, on O2   BPH (benign prostatic hyperplasia)   Dyspnea on exertion   Pulmonary fibrosis (HCC)   Chronic respiratory failure with hypoxia (HCC)   Syncope and collapse   AKI (acute kidney injury) ([place])   Hypoxia   Acute respiratory distress   Cardiac arrest (HCC)   Acute on chronic hypoxic/hypercarbic respiratory failure, POA PEA arrest Pulmonary  edema Interstitial pulmonary fibrosis/COPD Patient follows with pulmonology outpatient, Dr. Elsworth Soho.  Baseline oxygen requirement 3-5 L per nasal cannula. Was plan to start OFEV, but didn't follow-up for needed lab tests and was nervous to be seen in pulm office due to COVID-19 pandemic.  Patient with increased oxygen requirements during initial hospitalization and subsequently went into PEA arrest with ROSC achieved with less than 1 round of CPR and was alert and oriented following resuscitative efforts.  She was also noted to have pulmonary edema and was given aggressive IV diuresis with furosemide --IV diuretics discontinued on 04/06/2020 due to euvolemia and worsening renal function --Continue supplemental oxygen, maintain SPO2 between 88-94%, currently on 6 L nasal cannula with SPO2 97%, dropped to 77% w/ ambulation today and required 8L with 1 minute to recover --Albuterol neb every 4 hours as needed wheezing --Mucinex 600 mg p.o. twice daily --Incentive spirometry/flutter valve --Strict I's and O's and daily weights  Acute on chronic diastolic congestive heart failure TTE 04/01/2020 with LVEF 60-65%, no regional LV wall motion abnormalities, concentric LVH, mildly elevated PAP, LA mildly dilated, mild MR, moderate TR, IVC dilated.  Chest x-ray 04/03/2020 with cardiomegaly, interstitial edema. --Received aggressive IV diuresis while inpatient, now discontinued due to elevated creatinine --net negative 2.5L past 24h and net negative 13.8L since admission --strict I's and O's and daily weights  AKI on CKD stage IIIb Etiology likely prerenal in the setting of high-dose IV diuresis for pulmonary edema as above.  Baseline creatinine 1.4 with a GFR 49. --Cr 1.88>>1.43>1.646>1.72>2.28>1.97>1.36 --wt 86.2kg>81kg>79.5kg today --Diuretics discontinued on 04/06/2020 --Avoid  nephrotoxins, renally dose all medications --Continue to closely monitor urine output --Follow BMP daily  Essential  hypertension On losartan and Norvasc at home. --Hold home antihypertensives --BP 137/46 this morning, well controlled --Coreg 3.125 mg p.o. twice daily --Continue monitor blood pressure closely  Iron deficiency anemia Patient presenting with symptomatic anemia with a hemoglobin of 5.0 on admission.  Iron 20, TIBC 405, ferritin 11, folate 19.3, B12.  Transfused 2 unit PRBCs on 03/31/2020.  Received IV Feraheme on 12/28 and 12/31. --Hgb 5.0>8.0>7.6>7.1>>8.3>7.8 --ferrous sulfate 325mg  PO daily  Type 2 diabetes mellitus Hemoglobin A1c 5.7, 03/31/2020; well controlled. --Insulin sliding scale for coverage --CBGs before every meal/at bedtime  Pulmonary nodule Incidental finding of 10 mm irregular right apical pulmonary nodule on CT C-spine on admission. --Follow-up CT chest without contrast in 3 months to ensure stability is consideration of PET-CT for further evaluation  HLD: Atorvastatin 80 mg p.o. daily  Weakness, debility, deconditioning Patient with significant weakness and deconditioning.  Requires 6 L nasal cannula at rest to maintain SPO2 with increased oxygen requirement of 25 L with exertion.  Patient with multiple falls over the past several months at home, even down his steps.  Spouse reports that she has a two-level home with all the bedrooms upstairs and only a half bath downstairs.  She is very concerned about his multiple falls and his weakness about returning home. --Pending SNF placement   DVT prophylaxis: Heparin Code Status: Full code Family Communication: Updated patients spouse, Magaret via telephone this afternoon  Disposition Plan:  Status is: Inpatient  Remains inpatient appropriate because:Unsafe d/c plan, IV treatments appropriate due to intensity of illness or inability to take PO and Inpatient level of care appropriate due to severity of illness   Dispo:  Patient From: Home  Planned Disposition: [place]  Expected discharge date:  04/12/2020  Medically stable for discharge: Yes    Consultants:   PCCM: signed off 04/05/2020  Procedures:   VQ scan 12/26: no evidence of PE  TTE 12/26: LVEF 60-65%, no regional wall motion abnormalities  Antimicrobials:   None  Significant Hospital Events:  12/25 admit, anemic, 2 units PRBC.  12/27 worsening O2 requirements  12/28 PEA arrest tx to ICU. Started on BiPAP   Subjective: Patient seen and examined bedside, resting comfortably.  Currently on 7 L nasal cannula with SPO2 100% monitor; turned down oxygen to 6 L nasal cannula and remained 97%.  Continues with shortness of breath with minimal exertion.  Waiting SNF placement. No other questions or concerns at this time.  Denies headache, no fever/chills/night sweats, no nausea/vomiting/diarrhea, no chest pain, no palpitations, no abdominal pain, no paresthesias.  No acute events overnight per nursing staff.  Objective: Vitals:   04/11/20 1942 04/12/20 0501 04/12/20 0628 04/12/20 0800  BP: (!) 138/55 (!) 137/46  (!) 132/43  Pulse: 74 70  76  Resp: 20 18  19   Temp: 97.8 F (36.6 C) 97.9 F (36.6 C)  98 F (36.7 C)  TempSrc: Oral Oral  Oral  SpO2: 92% 100%  94%  Weight:   79.5 kg   Height:        Intake/Output Summary (Last 24 hours) at 04/12/2020 1401 Last data filed at 04/12/2020 1300 Gross per 24 hour  Intake --  Output 2350 ml  Net -2350 ml   Filed Weights   04/10/20 0615 04/11/20 0526 04/12/20 0628  Weight: 81 kg 79.7 kg 79.5 kg    Examination:  General exam: Appears calm and comfortable,  ill in appearance Respiratory system: Decreased breath sounds bilateral bases, otherwise clear to auscultation, normal respiratory effort without accessory muscle use, on 5 L nasal cannula with SPO2 97% Cardiovascular system: S1 & S2 heard, RRR. No JVD, murmurs, rubs, gallops or clicks. No pedal edema. Gastrointestinal system: Abdomen is nondistended, soft and nontender. No organomegaly or masses felt. Normal bowel  sounds heard. Central nervous system: Alert and oriented. No focal neurological deficits. Extremities: Symmetric 5 x 5 power. Skin: No rashes, lesions or ulcers Psychiatry: Judgement and insight appear poor. Mood & affect appropriate.     Data Reviewed: I have personally reviewed following labs and imaging studies  CBC: Recent Labs  Lab 04/07/20 0109 04/11/20 0318  WBC 9.2 6.7  NEUTROABS 6.0  --   HGB 8.3* 7.8*  HCT 27.8* 26.2*  MCV 84.8 88.5  PLT 233 216   Basic Metabolic Panel: Recent Labs  Lab 04/06/20 0146 04/07/20 0109 04/08/20 0554 04/11/20 0318  NA 138 137 137 137  K 3.9 4.2 3.8 4.6  CL 91* 92* 93* 100  CO2 33* 34* 33* 32  GLUCOSE 135* 112* 142* 122*  BUN 33* 47* 52* 34*  CREATININE 1.72* 2.28* 1.97* 1.36*  CALCIUM 8.8* 8.6* 8.3* 8.5*  MG  --   --  2.1  --   PHOS  --  4.5  --   --    GFR: Estimated Creatinine Clearance: 39.8 mL/min (A) (by C-G formula based on SCr of 1.36 mg/dL (H)). Liver Function Tests: Recent Labs  Lab 04/07/20 0109  ALBUMIN 2.6*   No results for input(s): LIPASE, AMYLASE in the last 168 hours. No results for input(s): AMMONIA in the last 168 hours. Coagulation Profile: No results for input(s): INR, PROTIME in the last 168 hours. Cardiac Enzymes: No results for input(s): CKTOTAL, CKMB, CKMBINDEX, TROPONINI in the last 168 hours. BNP (last 3 results) No results for input(s): PROBNP in the last 8760 hours. HbA1C: No results for input(s): HGBA1C in the last 72 hours. CBG: Recent Labs  Lab 04/11/20 1149 04/11/20 1642 04/11/20 2139 04/12/20 0733 04/12/20 1212  GLUCAP 149* 82 153* 157* 81   Lipid Profile: No results for input(s): CHOL, HDL, LDLCALC, TRIG, CHOLHDL, LDLDIRECT in the last 72 hours. Thyroid Function Tests: No results for input(s): TSH, T4TOTAL, FREET4, T3FREE, THYROIDAB in the last 72 hours. Anemia Panel: No results for input(s): VITAMINB12, FOLATE, FERRITIN, TIBC, IRON, RETICCTPCT in the last 72 hours. Sepsis  Labs: No results for input(s): PROCALCITON, LATICACIDVEN in the last 168 hours.  Recent Results (from the past 240 hour(s))  MRSA PCR Screening     Status: None   Collection Time: 04/03/20  7:07 AM   Specimen: Nasopharyngeal  Result Value Ref Range Status   MRSA by PCR NEGATIVE NEGATIVE Final    Comment:        The GeneXpert MRSA Assay (FDA approved for NASAL specimens only), is one component of a comprehensive MRSA colonization surveillance program. It is not intended to diagnose MRSA infection nor to guide or monitor treatment for MRSA infections. Performed at [facility], [address] [place], [place] [address]          Radiology Studies: No results found.      Scheduled Meds: . aspirin EC  81 mg Oral Daily  . atorvastatin  80 mg Oral QPM  . carvedilol  3.125 mg Oral BID WC  . Chlorhexidine Gluconate Cloth  6 each Topical Daily  . doxazosin  4 mg Oral  QHS  . ferrous sulfate  325 mg Oral Q breakfast  . guaiFENesin  600 mg Oral BID  . heparin injection (subcutaneous)  5,000 Units Subcutaneous Q8H  . insulin aspart  0-5 Units Subcutaneous QHS  . insulin aspart  0-9 Units Subcutaneous TID WC  . mouth rinse  15 mL Mouth Rinse BID  . oxymetazoline  1 spray Each Nare BID  . saline  1 application Each Nare [id number]  . senna  2 tablet Oral Daily  . sodium chloride flush  3 mL Intravenous Q12H   Continuous Infusions:   LOS: 12 days    Time spent: 36 minutes spent on chart review, discussion with nursing staff, consultants, updating family and interview/physical exam; more than 50% of that time was spent in counseling and/or coordination of care.    Reeves Musick J [place] DO [facility] Available via Epic secure chat 7am-7pm After these hours, please refer to coverage provider listed on [email or web address] 04/12/2020, 2:01 PM

## 2020-04-12 NOTE — Progress Notes (Signed)
Physical Therapy Treatment Patient Details Name: Bradley Johns MRN: [id number] DOB: 02-Mar-1935 Today's Date: 04/12/2020    History of Present Illness Bradley Johns is a 86 y.o. admitted with syncope. Pt with age indeterminate nasal fx. Worsening hypoxemia 12/27. 12/28 with PEA arrest and transfer to ICU on bipap. PMHx:OSA; HTN; HLD; DM; pulmonary fibrosis/COPD on home O2; and BPH    PT Comments    Pt demonstrating gradual progress but continues to be limited by high O2 needs with decreasing sats with activity.  Continue to recommend SNF due to hx of falls and high O2 needs.    Follow Up Recommendations  SNF;Supervision/Assistance - 24 hour     Equipment Recommendations  3in1 (PT);Other (comment) (rollator)    Recommendations for Other Services       Precautions / Restrictions Precautions Precautions: Fall;Other (comment) Precaution Comments: watch sats    Mobility  Bed Mobility Overal bed mobility: Needs Assistance Bed Mobility: Supine to Sit     Supine to sit: Supervision     General bed mobility comments: use of rail  Transfers Overall transfer level: Needs assistance Equipment used: Rolling walker (2 wheeled) Transfers: Sit to/from Stand Sit to Stand: Min guard         General transfer comment: performed x 3; cues for safe hand placement  Ambulation/Gait Ambulation/Gait assistance: Min guard Gait Distance (Feet): 40 Feet (40'x2) Assistive device: Rolling walker (2 wheeled) Gait Pattern/deviations: Step-through pattern;Decreased stride length;Trunk flexed Gait velocity: decreased   General Gait Details: Cues for posture and min guard for assist and lines.  Limited by decreased O2 sats and DOE of 3/4 requiring seated rest break. Pt was on 6 L HFNC at rest with sats 97% but dropped to 77% with walking requiring 8L and 1 minute to recover.  Decreased back to 6 L with sats 97% rest.   Stairs             Wheelchair Mobility    Modified Rankin  (Stroke Patients Only)       Balance Overall balance assessment: Needs assistance Sitting-balance support: No upper extremity supported;Feet supported Sitting balance-Leahy Scale: Good     Standing balance support: During functional activity;Single extremity supported Standing balance-Leahy Scale: Fair Standing balance comment: RW for ambulation but able to static stand                            Cognition Arousal/Alertness: Awake/alert Behavior During Therapy: WFL for tasks assessed/performed Overall Cognitive Status: Within Functional Limits for tasks assessed                                        Exercises      General Comments        Pertinent Vitals/Pain Pain Assessment: No/denies pain    Home Living                      Prior Function            PT Goals (current goals can now be found in the care plan section) Acute Rehab PT Goals Patient Stated Goal: to go home PT Goal Formulation: With patient Time For Goal Achievement: 04/18/20 Potential to Achieve Goals: Good Progress towards PT goals: Progressing toward goals    Frequency    Min 3X/week      PT Plan Current  plan remains appropriate    Co-evaluation              AM-PAC PT "6 Clicks" Mobility   Outcome Measure  Help needed turning from your back to your side while in a flat bed without using bedrails?: None Help needed moving from lying on your back to sitting on the side of a flat bed without using bedrails?: None Help needed moving to and from a bed to a chair (including a wheelchair)?: A Little Help needed standing up from a chair using your arms (e.g., wheelchair or bedside chair)?: A Little Help needed to walk in hospital room?: A Little Help needed climbing 3-5 steps with a railing? : A Little 6 Click Score: 20    End of Session Equipment Utilized During Treatment: Oxygen Activity Tolerance: Patient tolerated treatment well Patient left:  in chair;with call bell/phone within reach Nurse Communication: Mobility status PT Visit Diagnosis: Unsteadiness on feet (R26.81);Muscle weakness (generalized) (M62.81)     Time: [id number] PT Time Calculation (min) (ACUTE ONLY): 21 min  Charges:  $Gait Training: 8-22 mins                     Abran Richard, PT Acute Rehab Services Pager [phone] [name] [place] 04/12/2020, 11:54 AM

## 2020-04-12 NOTE — Consult Note (Signed)
   [facility] CM Inpatient Consult   04/12/2020  Bradley Johns 12/01/1934 [id number]  [facility] [ACO] Patient: [facility] Medicare  Patient screened for length of stay [LOS] 12 days to check for [facility] needs.  Current PT/OT recommendations are for a skilled nursing facility level of care.   Plan:  If patient transitions to a skilled nursing facility then post hospital TOC needs are to be met at that level of care.  Will follow til transition/disposition. No current [facility] Care Management needs assessed.  For questions,  Natividad Brood, RN BSN [facility] Liaison  [phone] business mobile phone Toll free office [phone]  Fax number: [phone] [email or web address]

## 2020-04-12 NOTE — Plan of Care (Signed)

## 2020-04-13 DIAGNOSIS — D649 Anemia, unspecified: Secondary | ICD-10-CM | POA: Diagnosis not present

## 2020-04-13 LAB — CBC
HCT: 27.8 % — ABNORMAL LOW (ref 39.0–52.0)
Hemoglobin: 8 g/dL — ABNORMAL LOW (ref 13.0–17.0)
MCH: 25.7 pg — ABNORMAL LOW (ref 26.0–34.0)
MCHC: 28.8 g/dL — ABNORMAL LOW (ref 30.0–36.0)
MCV: 89.4 fL (ref 80.0–100.0)
Platelets: 224 10*3/uL (ref 150–400)
RBC: 3.11 MIL/uL — ABNORMAL LOW (ref 4.22–5.81)
RDW: 21.8 % — ABNORMAL HIGH (ref 11.5–15.5)
WBC: 7.1 10*3/uL (ref 4.0–10.5)
nRBC: 0.3 % — ABNORMAL HIGH (ref 0.0–0.2)

## 2020-04-13 LAB — BASIC METABOLIC PANEL
Anion gap: 7 (ref 5–15)
BUN: 26 mg/dL — ABNORMAL HIGH (ref 8–23)
CO2: 29 mmol/L (ref 22–32)
Calcium: 8.9 mg/dL (ref 8.9–10.3)
Chloride: 102 mmol/L (ref 98–111)
Creatinine, Ser: 1.32 mg/dL — ABNORMAL HIGH (ref 0.61–1.24)
GFR, Estimated: 54 mL/min — ABNORMAL LOW (ref >60)
Glucose, Bld: 144 mg/dL — ABNORMAL HIGH (ref 70–99)
Potassium: 4.8 mmol/L (ref 3.5–5.1)
Sodium: 138 mmol/L (ref 135–145)

## 2020-04-13 LAB — URIC ACID: Uric Acid, Serum: 8.8 mg/dL — ABNORMAL HIGH (ref 3.7–8.6)

## 2020-04-13 LAB — GLUCOSE, CAPILLARY
Glucose-Capillary: 92 mg/dL (ref 70–99)
Glucose-Capillary: 162 mg/dL — ABNORMAL HIGH (ref 70–99)
Glucose-Capillary: 199 mg/dL — ABNORMAL HIGH (ref 70–99)
Glucose-Capillary: 219 mg/dL — ABNORMAL HIGH (ref 70–99)

## 2020-04-13 LAB — SARS CORONAVIRUS 2 (TAT 6-24 HRS): SARS Coronavirus 2: NEGATIVE

## 2020-04-13 MED ORDER — FERROUS SULFATE 325 (65 FE) MG PO TABS: 325.0000 mg | ORAL_TABLET | Freq: Every day | ORAL | 3 refills | Status: DC

## 2020-04-13 MED ORDER — AYR SALINE NASAL NA GEL: 1.0000 "application " | Freq: Two times a day (BID) | NASAL | 0 refills | Status: DC

## 2020-04-13 MED ORDER — SALINE SPRAY 0.65 % NA SOLN: 1.0000 | NASAL | 0 refills | Status: AC | PRN

## 2020-04-13 MED ORDER — PREDNISONE 20 MG PO TABS: 40.0000 mg | ORAL_TABLET | Freq: Every day | ORAL | 0 refills | Status: AC

## 2020-04-13 MED ORDER — CARVEDILOL 3.125 MG PO TABS: 3.1250 mg | ORAL_TABLET | Freq: Two times a day (BID) | ORAL | Status: DC

## 2020-04-13 NOTE — TOC Transition Note (Addendum)
Transition of Care [facility]) - CM/SW Discharge Note   Patient Details  Name: Bradley Johns MRN: [id number] Date of Birth: July 03, 1934  Transition of [facility]) CM/SW Contact:  Joanne Chars, LCSW Phone Number: 04/13/2020, 3:54 PM   Clinical Narrative:  Pt discharging [facility].  RN call report to [phone].  As of Friday afternoon, family had not signed paperwork and pt cannot transfer until this is in place.  Covid test sent to lab Friday afternoon and also not back as of this writing. Transfer can take place Saturday if necessary.   1410-Bradley Johns has paperwork.  DC summary sent.  Pt can transfer tonight as late as needed when covid is back. RN please call PTAR when ready: [phone].     Final next level of care: Skilled Nursing Facility Barriers to Discharge: No Barriers Identified   Patient Goals and CMS Choice Patient states their goals for this hospitalization and ongoing recovery are:: "breath easier" CMS Medicare.gov Compare Post Acute Care list provided to:: Patient Choice offered to / list presented to : Patient  Discharge Placement              Patient chooses bed at:  [facility]) Patient to be transferred to facility by: [place] Name of family member notified: Wife Bradley Johns Patient and family notified of of transfer: 04/13/20  Discharge Plan and Services     Post Acute Care Choice: Home Health          DME Arranged: 3-N-1,[place] D[facility] Agency: [employer] Date DME Agency Contacted: 04/06/20     [place] Arranged: PT,OT [place] Agency: [place] Date [facility] Contacted: 04/05/20 Time HH Agency Contacted: 1528 Representative spoke with at [place]: Cindie  Social Determinants of Health ([place]) Interventions     Readmission Risk Interventions No flowsheet data found.

## 2020-04-13 NOTE — Progress Notes (Signed)
PROGRESS NOTE    Bradley Johns  OJJ:[id number] DOB: [birth year] DOA: 03/31/2020 PCP: Colon Branch, MD    Brief Narrative:  Bradley Johns is an 86 year old male with past medical history significant for pulmonary fibrosis/COPD on 3-5 L nasal cannula at home, OSA, essential hypertension, type 2 diabetes mellitus, BPH who presented to [name] ED following syncopal episode at home on 03/31/2020.  Upon arrival to the ED, he was found to be dyspneic with a hemoglobin of 5.  Hemoccult negative.  Patient with some vague complaints of intermittent hematuria and he was subsequently transfused 2 unit PVCs and admitted to the hospitalist service.  He became increasingly more dyspneic with worsening hypoxia, VQ scan was performed and was low risk for pulmonary embolism.  On 04/02/2020, oxygen requirement significantly increased with concern for pulmonary edema; he was started on IV Lasix and Solu-Medrol.  In the a.m. of 04/03/2020, central telemetry notified his primary nurse of rhythm change in which she was found to be bradycardic and shortly thereafter he lost his pulse and underwent PEA arrest.  Patient was successfully resuscitated with less than 1 round of CPR prior to achieving ROSC.  Postarrest he was awake and alert and transferred to the ICU for closer monitoring.  Patient was transferred from [facility] back to the Hospitalist service on 04/05/2020.   Assessment & Plan:   Principal Problem:   Symptomatic anemia Active Problems:   Diabetes mellitus type 2 in nonobese (HCC)   Hyperlipidemia   Essential hypertension   COPD with emphysema, on O2   BPH (benign prostatic hyperplasia)   Dyspnea on exertion   Pulmonary fibrosis (HCC)   Chronic respiratory failure with hypoxia (HCC)   Syncope and collapse   AKI (acute kidney injury) ([place])   Hypoxia   Acute respiratory distress   Cardiac arrest (HCC)   Acute on chronic hypoxic/hypercarbic respiratory failure, POA PEA arrest Pulmonary  edema Interstitial pulmonary fibrosis/COPD Patient follows with pulmonology outpatient, Dr. Elsworth Soho.  Baseline oxygen requirement 3-5 L per nasal cannula. Was plan to start OFEV, but didn't follow-up for needed lab tests and was nervous to be seen in pulm office due to COVID-19 pandemic.  Patient with increased oxygen requirements during initial hospitalization and subsequently went into PEA arrest with ROSC achieved with less than 1 round of CPR and was alert and oriented following resuscitative efforts.  She was also noted to have pulmonary edema and was given aggressive IV diuresis with furosemide --IV diuretics discontinued on 04/06/2020 due to euvolemia and worsening renal function --Continue supplemental oxygen, maintain SPO2 between 88-94%, currently on 6 L nasal cannula with SPO2 97%, decreased to 4 L nasal cannula at rest with SPO2 95-96%.   --Albuterol neb every 4 hours as needed wheezing --Mucinex 600 mg p.o. twice daily --Incentive spirometry/flutter valve --Strict I's and O's and daily weights  Acute on chronic diastolic congestive heart failure TTE 04/01/2020 with LVEF 60-65%, no regional LV wall motion abnormalities, concentric LVH, mildly elevated PAP, LA mildly dilated, mild MR, moderate TR, IVC dilated.  Chest x-ray 04/03/2020 with cardiomegaly, interstitial edema. --Received aggressive IV diuresis while inpatient, now discontinued due to elevated creatinine --net negative 1.8L past 24h and net negative 15.7L since admission --wt 86.2kg>78.5kg today --strict I's and O's and daily weights  AKI on CKD stage IIIb Etiology likely prerenal in the setting of high-dose IV diuresis for pulmonary edema as above.  Baseline creatinine 1.4 with a GFR 49. --Cr 1.88>>1.43>1.646>1.72>2.28>1.97>1.36>1.32 --Diuretics discontinued on 04/06/2020 --Avoid nephrotoxins,  renally dose all medications --Continue to closely monitor urine output  Essential hypertension On losartan and Norvasc at  home. --Hold home antihypertensives --BP 132/62 this morning, well controlled --Coreg 3.125 mg p.o. twice daily --Continue monitor blood pressure closely  Iron deficiency anemia Patient presenting with symptomatic anemia with a hemoglobin of 5.0 on admission.  Iron 20, TIBC 405, ferritin 11, folate 19.3, B12.  Transfused 2 unit PRBCs on 03/31/2020.  Received IV Feraheme on 12/28 and 12/31. --Hgb 5.0>8.0>7.6>7.1>>8.3>7.8>8.0 --ferrous sulfate 325mg  PO daily  Type 2 diabetes mellitus Hemoglobin A1c 5.7, 03/31/2020; well controlled. --Insulin sliding scale for coverage --CBGs before every meal/at bedtime  Pulmonary nodule Incidental finding of 10 mm irregular right apical pulmonary nodule on CT C-spine on admission. --Follow-up CT chest without contrast in 3 months to ensure stability vs consideration of PET-CT for further evaluation  HLD: Atorvastatin 80 mg p.o. daily  Weakness, debility, deconditioning Patient with significant weakness and deconditioning.  Requires 6 L nasal cannula at rest to maintain SPO2 with increased oxygen requirement of 25 L with exertion.  Patient with multiple falls over the past several months at home, even down his steps.  Spouse reports that she has a two-level home with all the bedrooms upstairs and only a half bath downstairs.  She is very concerned about his multiple falls and his weakness about returning home. --Pending SNF placement   DVT prophylaxis: Heparin Code Status: Full code Family Communication: Updated patients spouse, Magaret via telephone yesterday  Disposition Plan:  Status is: Inpatient  Remains inpatient appropriate because:Unsafe d/c plan, IV treatments appropriate due to intensity of illness or inability to take PO and Inpatient level of care appropriate due to severity of illness   Dispo:  Patient From: Home  Planned Disposition: [place]  Expected discharge date: 04/14/2020  Medically stable for discharge: Yes     Consultants:   PCCM: signed off 04/05/2020  Procedures:   VQ scan 12/26: no evidence of PE  TTE 12/26: LVEF 60-65%, no regional wall motion abnormalities  Antimicrobials:   None  Significant Hospital Events:  12/25 admit, anemic, 2 units PRBC.  12/27 worsening O2 requirements  12/28 PEA arrest tx to ICU. Started on BiPAP   Subjective: Patient seen and examined bedside, resting comfortably.  Currently on 6 L nasal cannula with SPO2 97% monitor; turned down oxygen to 4 L nasal cannula and remained 96%.  Continues with shortness of breath with minimal exertion.  Waiting SNF placement. No other questions or concerns at this time.  Denies headache, no fever/chills/night sweats, no nausea/vomiting/diarrhea, no chest pain, no palpitations, no abdominal pain, no paresthesias.  No acute events overnight per nursing staff.  Objective: Vitals:   04/12/20 1619 04/12/20 2006 04/13/20 0508 04/13/20 0835  BP: (!) 162/63 (!) 159/63 132/62 (!) 163/51  Pulse: 84 93 90 61  Resp: 20 16 16 20   Temp: 98 F (36.7 C) 98.6 F (37 C) 97.6 F (36.4 C) 98 F (36.7 C)  TempSrc: Oral  Oral Oral  SpO2: 95% 93% 94% 98%  Weight:   78.5 kg   Height:        Intake/Output Summary (Last 24 hours) at 04/13/2020 1215 Last data filed at 04/13/2020 1205 Gross per 24 hour  Intake --  Output 1950 ml  Net -1950 ml   Filed Weights   04/11/20 0526 04/12/20 0628 04/13/20 0508  Weight: 79.7 kg 79.5 kg 78.5 kg    Examination:  General exam: Appears calm and comfortable, ill in  appearance Respiratory system: Decreased breath sounds bilateral bases, otherwise clear to auscultation, normal respiratory effort without accessory muscle use, on 4 L nasal cannula with SPO2 97% Cardiovascular system: S1 & S2 heard, RRR. No JVD, murmurs, rubs, gallops or clicks. No pedal edema. Gastrointestinal system: Abdomen is nondistended, soft and nontender. No organomegaly or masses felt. Normal bowel sounds heard. Central  nervous system: Alert and oriented. No focal neurological deficits. Extremities: Symmetric 5 x 5 power. Skin: No rashes, lesions or ulcers Psychiatry: Judgement and insight appear poor. Mood & affect appropriate.     Data Reviewed: I have personally reviewed following labs and imaging studies  CBC: Recent Labs  Lab 04/07/20 0109 04/11/20 0318 04/13/20 0101  WBC 9.2 6.7 7.1  NEUTROABS 6.0  --   --   HGB 8.3* 7.8* 8.0*  HCT 27.8* 26.2* 27.8*  MCV 84.8 88.5 89.4  PLT 233 216 [id number]   Basic Metabolic Panel: Recent Labs  Lab 04/07/20 0109 04/08/20 0554 04/11/20 0318 04/13/20 0101  NA 137 137 137 138  K 4.2 3.8 4.6 4.8  CL 92* 93* 100 102  CO2 34* 33* 32 29  GLUCOSE 112* 142* 122* 144*  BUN 47* 52* 34* 26*  CREATININE 2.28* 1.97* 1.36* 1.32*  CALCIUM 8.6* 8.3* 8.5* 8.9  MG  --  2.1  --   --   PHOS 4.5  --   --   --    GFR: Estimated Creatinine Clearance: 41 mL/min (A) (by C-G formula based on SCr of 1.32 mg/dL (H)). Liver Function Tests: Recent Labs  Lab 04/07/20 0109  ALBUMIN 2.6*   No results for input(s): LIPASE, AMYLASE in the last 168 hours. No results for input(s): AMMONIA in the last 168 hours. Coagulation Profile: No results for input(s): INR, PROTIME in the last 168 hours. Cardiac Enzymes: No results for input(s): CKTOTAL, CKMB, CKMBINDEX, TROPONINI in the last 168 hours. BNP (last 3 results) No results for input(s): PROBNP in the last 8760 hours. HbA1C: No results for input(s): HGBA1C in the last 72 hours. CBG: Recent Labs  Lab 04/12/20 1212 04/12/20 1654 04/12/20 2022 04/13/20 0735 04/13/20 1200  GLUCAP 81 126* 217* 92 162*   Lipid Profile: No results for input(s): CHOL, HDL, LDLCALC, TRIG, CHOLHDL, LDLDIRECT in the last 72 hours. Thyroid Function Tests: No results for input(s): TSH, T4TOTAL, FREET4, T3FREE, THYROIDAB in the last 72 hours. Anemia Panel: No results for input(s): VITAMINB12, FOLATE, FERRITIN, TIBC, IRON, RETICCTPCT in the last  72 hours. Sepsis Labs: No results for input(s): PROCALCITON, LATICACIDVEN in the last 168 hours.  No results found for this or any previous visit (from the past 240 hour(s)).       Radiology Studies: No results found.      Scheduled Meds: . aspirin EC  81 mg Oral Daily  . atorvastatin  80 mg Oral QPM  . carvedilol  3.125 mg Oral BID WC  . Chlorhexidine Gluconate Cloth  6 each Topical Daily  . doxazosin  4 mg Oral QHS  . ferrous sulfate  325 mg Oral Q breakfast  . guaiFENesin  600 mg Oral BID  . heparin injection (subcutaneous)  5,000 Units Subcutaneous Q8H  . insulin aspart  0-5 Units Subcutaneous QHS  . insulin aspart  0-9 Units Subcutaneous TID WC  . mouth rinse  15 mL Mouth Rinse BID  . predniSONE  40 mg Oral Q breakfast  . saline  1 application Each Nare [id number]  . senna  2 tablet Oral Daily  .  sodium chloride flush  3 mL Intravenous Q12H   Continuous Infusions:   LOS: 13 days    Time spent: 35 minutes spent on chart review, discussion with nursing staff, consultants, updating family and interview/physical exam; more than 50% of that time was spent in counseling and/or coordination of care.    Dejon Lukas J [place] DO [facility] Available via Epic secure chat 7am-7pm After these hours, please refer to coverage provider listed on [email or web address] 04/13/2020, 12:15 PM

## 2020-04-13 NOTE — Discharge Summary (Signed)
Physician Discharge Summary  ABDULAHI SCHOR NWG:[id number] DOB: [birth year] DOA: 03/31/2020  PCP: Colon Branch, MD  Admit date: 03/31/2020 Discharge date: 04/13/2020  Admitted From: Home Disposition: [facility]  Recommendations for Outpatient Follow-up:  1. Follow up with PCP in 1-2 weeks 2. Continue prednisone 40 mg p.o. daily x5 more days for acute gout flare 3. Discontinued home amlodipine and losartan 4. Decrease carvedilol to 3.125 mg p.o. twice daily 5. Please obtain BMP/CBC in one week 6. Repeat CT chest without contrast 3 months for right apical pulmonary nodule versus consideration of CT PET scan.   Discharge Condition: Stable CODE STATUS: Full code Diet recommendation: Heart healthy/consistent carbohydrate diet  History of present illness:  Bradley Johns is an 86 year old male with past medical history significant for pulmonary fibrosis/COPD on 3-5 L nasal cannula at home, OSA, essential hypertension, type 2 diabetes mellitus, BPH who presented to [name] ED following syncopal episode at home on 03/31/2020.  Upon arrival to the ED, he was found to be dyspneic with a hemoglobin of 5.  Hemoccult negative.  Patient with some vague complaints of intermittent hematuria and he was subsequently transfused 2 unit PVCs and admitted to the hospitalist service.  He became increasingly more dyspneic with worsening hypoxia, VQ scan was performed and was low risk for pulmonary embolism.  On 04/02/2020, oxygen requirement significantly increased with concern for pulmonary edema; he was started on IV Lasix and Solu-Medrol.  In the a.m. of 04/03/2020, central telemetry notified his primary nurse of rhythm change in which she was found to be bradycardic and shortly thereafter he lost his pulse and underwent PEA arrest.  Patient was successfully resuscitated with less than 1 round of CPR prior to achieving ROSC.  Postarrest he was awake and alert and transferred to the ICU for closer  monitoring.  Patient was transferred from [facility] back to the Hospitalist service on 04/05/2020.  Hospital course:  Acute on chronic hypoxic/hypercarbic respiratory failure, POA PEA arrest Pulmonary edema Interstitial pulmonary fibrosis/COPD Patient follows with pulmonology outpatient, Dr. Elsworth Soho.  Baseline oxygen requirement 3-5 L per nasal cannula. Was plan to start OFEV, but didn't follow-up for needed lab tests and was nervous to be seen in pulm office due to COVID-19 pandemic.  Patient with increased oxygen requirements during initial hospitalization and subsequently went into PEA arrest with ROSC achieved with less than 1 round of CPR and was alert and oriented following resuscitative efforts.  She was also noted to have pulmonary edema and was given aggressive IV diuresis with furosemide. IV diuretics discontinued on 04/06/2020 due to euvolemia and worsening renal function.  Continues on supplemental oxygen on 4-6 L nasal cannula at rest.  Continue incentive spirometry/flutter valve.  Outpatient follow-up with cardiology.  Acute on chronic diastolic congestive heart failure TTE 04/01/2020 with LVEF 60-65%, no regional LV wall motion abnormalities, concentric LVH, mildly elevated PAP, LA mildly dilated, mild MR, moderate TR, IVC dilated.  Chest x-ray 04/03/2020 with cardiomegaly, interstitial edema. Received aggressive IV diuresis while inpatient and was discontinued due to elevated creatinine.  Patient was net -15.7 L since admission with discharge weight 78.5 kg.  Continue carvedilol at reduced dose 3.125 mg p.o. twice daily.  Follow-up blood pressures outpatient with consideration of restarting ARB at reduced dose.  AKI on CKD stage IIIb Etiology likely prerenal in the setting of high-dose IV diuresis for pulmonary edema as above.  Baseline creatinine 1.4 with a GFR 49.  Creatinine peaked at 2.28 during hospitalization.  Creatinine  1.32 at time of discharge.  Repeat BMP 1 week.  Essential  hypertension On losartan and Norvasc at home, which has been discontinued in favor of carvedilol 3.125 mg p.o. twice daily.  Continue to monitor blood pressure closely, consider addition back of ARB if BP and creatinine remained stable.  Iron deficiency anemia Patient presenting with symptomatic anemia with a hemoglobin of 5.0 on admission.  Iron 20, TIBC 405, ferritin 11, folate 19.3, B12.  Transfused 2 unit PRBCs on 03/31/2020.  Received IV Feraheme on 12/28 and 12/31.  Hemoglobin 8.0 at time of discharge.  Continue ferrous sulfate 325 mg p.o. daily.  Check CBC 1 week.  Type 2 diabetes mellitus Hemoglobin A1c 5.7, 03/31/2020; well controlled.  Resume home Actos and Metformin.  Pulmonary nodule Incidental finding of 10 mm irregular right apical pulmonary nodule on CT C-spine on admission. Follow-up CT chest without contrast in 3 months to ensure stability vs consideration of PET-CT for further evaluation  HLD: Atorvastatin 80 mg p.o. daily  Weakness, debility, deconditioning Patient with significant weakness and deconditioning.  Requires 6 L nasal cannula at rest to maintain SPO2 with increased oxygen requirement of 25 L with exertion.  Patient with multiple falls over the past several months at home, even down his steps.  Spouse reports that she has a two-level home with all the bedrooms upstairs and only a half bath downstairs.  She is very concerned about his multiple falls and his weakness about returning home.  Discharging to SNF for further rehabilitation.  Discharge Diagnoses:  Principal Problem:   Symptomatic anemia Active Problems:   Diabetes mellitus type 2 in nonobese (HCC)   Hyperlipidemia   Essential hypertension   COPD with emphysema, on O2   BPH (benign prostatic hyperplasia)   Dyspnea on exertion   Pulmonary fibrosis (HCC)   Chronic respiratory failure with hypoxia (HCC)   Syncope and collapse   AKI (acute kidney injury) ([place])   Hypoxia   Acute respiratory  distress   Cardiac arrest [facility])    Discharge Instructions  Discharge Instructions    Diet - low sodium heart healthy   Complete by: As directed    Increase activity slowly   Complete by: As directed    No wound care   Complete by: As directed      Allergies as of 04/13/2020   No Known Allergies     Medication List    STOP taking these medications   amLODipine 10 MG tablet Commonly known as: NORVASC   HYDROcodone-acetaminophen 5-325 MG tablet Commonly known as: NORCO/VICODIN   losartan 50 MG tablet Commonly known as: COZAAR     TAKE these medications   Accu-Chek Aviva Plus test strip Generic drug: glucose blood Reported on 06/20/2015   Accu-Chek Aviva Plus w/Device Kit Reported on 06/20/2015   Accu-Chek Softclix Lancets lancets Reported on 06/20/2015   aspirin 81 MG tablet Take 1 tablet (81 mg total) by mouth daily. Start taking 05/27/18   atorvastatin 80 MG tablet Commonly known as: LIPITOR Take 1 tablet (80 mg total) by mouth daily. What changed: when to take this   B-D UF III MINI PEN NEEDLES 31G X 5 MM Misc Generic drug: Insulin Pen Needle Reported on 06/20/2015   carvedilol 3.125 MG tablet Commonly known as: COREG Take 1 tablet (3.125 mg total) by mouth 2 (two) times daily with a meal. What changed:   medication strength  how much to take   colchicine 0.6 MG tablet Take 1 tablet (0.6 mg  total) by mouth 2 (two) times daily as needed.   doxazosin 4 MG tablet Commonly known as: CARDURA Take 1 tablet (4 mg total) by mouth at bedtime.   ferrous sulfate 325 (65 FE) MG tablet Take 1 tablet (325 mg total) by mouth daily with breakfast. Start taking on: April 14, 2020   ketoconazole 2 % cream Commonly known as: NIZORAL Apply 1 application topically daily. What changed:   when to take this  reasons to take this   metFORMIN 500 MG tablet Commonly known as: GLUCOPHAGE Take 500 mg by mouth 2 (two) times daily with a meal.   multivitamin  tablet Take 1 tablet by mouth daily.   OXYGEN Inhale into the lungs. 3L Nocturnal and PRN for Hypoxemia   pioglitazone 45 MG tablet Commonly known as: ACTOS Take 45 mg by mouth daily.   predniSONE 20 MG tablet Commonly known as: DELTASONE Take 2 tablets (40 mg total) by mouth daily with breakfast for 5 days. Start taking on: April 14, 2020   saline Gel Place 1 application into both nostrils every 12 (twelve) hours.   sodium chloride 0.65 % Soln nasal spray Commonly known as: OCEAN Place 1 spray into both nostrils as needed for congestion.            Durable Medical Equipment  (From admission, onward)         Start     Ordered   04/05/20 1503  For home use only DME Walker rolling  Once       Question Answer Comment  Walker: With 5 Inch Wheels   Patient needs a walker to treat with the following condition Ambulatory dysfunction      04/05/20 1502   04/05/20 1502  For home use only DME 3 n 1  Once        04/05/20 1502   04/05/20 0700  For home use only DME 3 n 1  Once        04/05/20 0536          Contact information for follow-up providers    [facility] Follow up.   Specialty: Home Health Services Why: [name] will call you to schedule your first appointment within 24-48 hours of discharge. Contact information: [place] [address] [phone]            Contact information for after-discharge care    Destination    [facility] .   Service: Skilled Nursing Contact information: [address] [place] [phone]                 No Known Allergies  Consultations:  PCCM   Procedures/Studies: CT Head Wo Contrast  Result Date: 03/31/2020 CLINICAL DATA:  Unwitnessed fall. EXAM: CT HEAD WITHOUT CONTRAST CT MAXILLOFACIAL WITHOUT CONTRAST CT CERVICAL SPINE WITHOUT CONTRAST TECHNIQUE: Multidetector CT imaging of the head, cervical spine, and maxillofacial  structures were performed using the standard protocol without intravenous contrast. Multiplanar CT image reconstructions of the cervical spine and maxillofacial structures were also generated. COMPARISON:  None. FINDINGS: CT HEAD FINDINGS Brain: There is no evidence for acute hemorrhage, hydrocephalus, mass lesion, or abnormal extra-axial fluid collection. No definite CT evidence for acute infarction. Diffuse loss of parenchymal volume is consistent with atrophy. Patchy low attenuation in the deep hemispheric and periventricular white matter is nonspecific, but likely reflects chronic microvascular ischemic demyelination. Vascular: No hyperdense vessel or unexpected calcification. Skull: No evidence for fracture.  No worrisome lytic or sclerotic lesion. Other: None. CT MAXILLOFACIAL FINDINGS Osseous: Age indeterminate bilateral nasal bone fractures evident. No other acute facial bone fracture. Mandible is intact. Temporomandibular joints are located with some mild degenerative changes in the left TMJ. Orbits: Negative. No traumatic or inflammatory finding. Sinuses: Air-fluid level noted right maxillary sinus. Remaining paranasal sinuses and mastoid air cells are clear. Soft tissues: Negative. CT CERVICAL SPINE FINDINGS Alignment: Reversal of normal cervical lordosis without subluxation. Skull base and vertebrae: No acute fracture. No primary bone lesion or focal pathologic process. Soft tissues and spinal canal: No prevertebral fluid or swelling. No visible canal hematoma. Disc levels: Loss of disc height noted diffusely in the cervical spine from C4-5 down to C6-7 with multilevel associated endplate spurring. Upper chest: Aberrant origin right subclavian artery noted. Emphysema noted in the lung apices with 10 mm irregular right apical nodule visible on image 92 of series 4. Other: None. IMPRESSION: 1. No acute intracranial abnormality. Atrophy with chronic small vessel white matter ischemic disease. 2. Age  indeterminate bilateral nasal bone fractures. No other acute facial bone fracture. 3. Air-fluid level in the right maxillary sinus. This could be related to hemorrhage in the setting of acute trauma or acute sinusitis. 4. Degenerative changes in the cervical spine without fracture. 5. 10 mm irregular right apical pulmonary nodule. Follow-up CT chest without contrast in 3 months recommended to ensure stability as this may be related to scarring. PET-CT could also be used to further evaluate as clinically warranted. 6. Aberrant origin right subclavian artery. 7. Reversal of normal cervical lordosis. This can be related to patient positioning, muscle spasm or soft tissue injury. 8. Emphysema (ICD10-J43.9). Electronically Signed   By: Misty Stanley M.D.   On: 03/31/2020 08:05   CT Cervical Spine Wo Contrast  Result Date: 03/31/2020 CLINICAL DATA:  Unwitnessed fall. EXAM: CT HEAD WITHOUT CONTRAST CT MAXILLOFACIAL WITHOUT CONTRAST CT CERVICAL SPINE WITHOUT CONTRAST TECHNIQUE: Multidetector CT imaging of the head, cervical spine, and maxillofacial structures were performed using the standard protocol without intravenous contrast. Multiplanar CT image reconstructions of the cervical spine and maxillofacial structures were also generated. COMPARISON:  None. FINDINGS: CT HEAD FINDINGS Brain: There is no evidence for acute hemorrhage, hydrocephalus, mass lesion, or abnormal extra-axial fluid collection. No definite CT evidence for acute infarction. Diffuse loss of parenchymal volume is consistent with atrophy. Patchy low attenuation in the deep hemispheric and periventricular white matter is nonspecific, but likely reflects chronic microvascular ischemic demyelination. Vascular: No hyperdense vessel or unexpected calcification. Skull: No evidence for fracture. No worrisome lytic or sclerotic lesion. Other: None. CT MAXILLOFACIAL FINDINGS Osseous: Age indeterminate bilateral nasal bone fractures evident. No other acute  facial bone fracture. Mandible is intact. Temporomandibular joints are located with some mild degenerative changes in the left TMJ. Orbits: Negative. No traumatic or inflammatory finding. Sinuses: Air-fluid level noted right maxillary sinus. Remaining paranasal sinuses and mastoid air cells are clear. Soft tissues: Negative. CT CERVICAL SPINE FINDINGS Alignment: Reversal of normal cervical lordosis without subluxation. Skull base and vertebrae: No acute fracture. No primary bone lesion or focal pathologic process. Soft tissues and spinal canal: No prevertebral fluid or swelling. No visible canal hematoma. Disc levels: Loss of disc height noted diffusely in the cervical spine from C4-5 down to C6-7 with multilevel associated endplate spurring. Upper chest: Aberrant origin right subclavian artery noted. Emphysema noted in the lung apices with 10 mm irregular right apical nodule visible on image 92 of series 4. Other: None. IMPRESSION: 1.  No acute intracranial abnormality. Atrophy with chronic small vessel white matter ischemic disease. 2. Age indeterminate bilateral nasal bone fractures. No other acute facial bone fracture. 3. Air-fluid level in the right maxillary sinus. This could be related to hemorrhage in the setting of acute trauma or acute sinusitis. 4. Degenerative changes in the cervical spine without fracture. 5. 10 mm irregular right apical pulmonary nodule. Follow-up CT chest without contrast in 3 months recommended to ensure stability as this may be related to scarring. PET-CT could also be used to further evaluate as clinically warranted. 6. Aberrant origin right subclavian artery. 7. Reversal of normal cervical lordosis. This can be related to patient positioning, muscle spasm or soft tissue injury. 8. Emphysema (ICD10-J43.9). Electronically Signed   By: Misty Stanley M.D.   On: 03/31/2020 08:05   NM Pulmonary Perfusion  Result Date: 04/01/2020 CLINICAL DATA:  Pulmonary fibrosis.  COPD.  Diabetes  EXAM: NUCLEAR MEDICINE PERFUSION LUNG SCAN TECHNIQUE: Perfusion images were obtained in multiple projections after intravenous injection of radiopharmaceutical. RADIOPHARMACEUTICALS:  4.1 mCi Tc-74mMAA COMPARISON:  Chest CT 06/09/2018, CT 06/09/2018, radiograph 06/02/2019 FINDINGS: There multiple small peripheral perfusion defects within the bilateral upper and lower lobes. Small defects are favored related to COPD and interstitial fibrosis seen on comparison CT. There is patchy peripheral airspace disease also seen on radiograph 1 day prior. IMPRESSION: 1. No convincing evidence acute pulmonary embolism. 2. Multiple small peripheral perfusion defects likely a combination of COPD, interstitial fibrosis, and multifocal peripheral airspace opacities seen on comparison radiograph 1 day prior. Electronically Signed   By: SSuzy BouchardM.D.   On: 04/01/2020 16:53   DG CHEST PORT 1 VIEW  Result Date: 04/03/2020 CLINICAL DATA:  Shortness of breath.  Cardiac arrest. EXAM: PORTABLE CHEST 1 VIEW COMPARISON:  Prior study same day.  04/02/2020.  03/31/2020. FINDINGS: Cardiomegaly. Persistent bilateral interstitial prominence most consistent interstitial edema. Pneumonitis cannot be excluded. No prominent pleural effusion. No pneumothorax. No acute bony abnormality identified. Chest is unchanged from prior study same day. IMPRESSION: Cardiomegaly. Persistent bilateral interstitial prominence most consistent with interstitial edema. Pneumonitis cannot be excluded. Chest is unchanged from prior study same day. Electronically Signed   By: TMarcello Moores Register   On: 04/03/2020 06:39   DG CHEST PORT 1 VIEW  Result Date: 04/03/2020 CLINICAL DATA:  Cardiac arrest. EXAM: PORTABLE CHEST 1 VIEW COMPARISON:  04/02/2020 03/31/2020. FINDINGS: Mediastinum and hilar structures normal. Cardiomegaly. Persistent bilateral interstitial prominence consistent interstitial edema. Pneumonitis cannot be excluded. No prominent pleural  effusion. No pneumothorax. No acute bony abnormality is identified. IMPRESSION: Cardiomegaly. Persistent bilateral interstitial prominence consistent with interstitial edema. Pneumonitis cannot be excluded. Similar findings noted on prior exam. Electronically Signed   By: [place]  On: 04/03/2020 05:38   DG CHEST PORT 1 VIEW  Result Date: 04/02/2020 CLINICAL DATA:  Dyspnea. EXAM: PORTABLE CHEST 1 VIEW COMPARISON:  One-view chest x-ray 03/31/2020 FINDINGS: Heart is enlarged. Atherosclerotic calcifications are present at the arch. Diffuse interstitial and airspace opacities have increased significantly since the prior exam, predominantly in the lower lobes. Small effusions are suspected. Axial skeleton is unremarkable. IMPRESSION: 1. Cardiomegaly with increasing interstitial and airspace disease, compatible with congestive heart failure. Infection is not excluded. 2. Probable small bilateral pleural effusions. Electronically Signed   By: CSan MorelleM.D.   On: 04/02/2020 01:36   DG Chest Port 1 View  Result Date: 03/31/2020 CLINICAL DATA:  Syncope EXAM: PORTABLE CHEST 1 VIEW COMPARISON:  05/22/2018 FINDINGS: 0733 hours. The cardio  pericardial silhouette is enlarged. There is pulmonary vascular congestion without overt pulmonary edema. Patchy bibasilar airspace disease noted. No pleural effusion. The visualized bony structures of the thorax show no acute abnormality. Telemetry leads overlie the chest. IMPRESSION: 1. Enlargement of the cardio pericardial silhouette with pulmonary vascular congestion. 2. Diffuse interstitial lung disease with patchy bibasilar airspace opacity which may be related to atelectasis or pneumonia. Electronically Signed   By: Misty Stanley M.D.   On: 03/31/2020 07:36   ECHOCARDIOGRAM COMPLETE  Result Date: 04/01/2020    ECHOCARDIOGRAM REPORT   Patient Name:   SHYHIEM BEENEY Date of Exam: 04/01/2020 Medical Rec #:  [id number]       Height:       68.0 in Accession  #:    [id number]      Weight:       181.2 lb Date of Birth:  1935-03-14        BSA:          1.960 m Patient Age:    86 years        BP:           148/56 mmHg Patient Gender: M               HR:           73 bpm. Exam Location:  Inpatient Procedure: 2D Echo, Cardiac Doppler and Color Doppler Indications:    R55 Syncope  History:        Patient has prior history of Echocardiogram examinations, most                 recent 12/30/2012. COPD.  Sonographer:    Merrie Roof RDCS Referring Phys: [place]  1. Left ventricular ejection fraction, by estimation, is 60 to 65%. The left ventricle has normal function. The left ventricle has no regional wall motion abnormalities. There is mild concentric left ventricular hypertrophy. Left ventricular diastolic parameters were normal.  2. Right ventricular systolic function is normal. The right ventricular size is normal. There is moderately elevated pulmonary artery systolic pressure. The estimated right ventricular systolic pressure is 09.7 mmHg.  3. Left atrial size was mildly dilated.  4. The mitral valve is normal in structure. Mild mitral valve regurgitation. No evidence of mitral stenosis.  5. Tricuspid valve regurgitation is mild to moderate.  6. The aortic valve is normal in structure. There is moderate calcification of the aortic valve. There is moderate thickening of the aortic valve. Aortic valve regurgitation is not visualized. Mild aortic valve stenosis. Aortic valve mean gradient measures 9.0 mmHg.  7. The inferior vena cava is dilated in size with >50% respiratory variability, suggesting right atrial pressure of 8 mmHg. FINDINGS  Left Ventricle: Left ventricular ejection fraction, by estimation, is 60 to 65%. The left ventricle has normal function. The left ventricle has no regional wall motion abnormalities. The left ventricular internal cavity size was normal in size. There is  mild concentric left ventricular hypertrophy. Left ventricular  diastolic parameters were normal. Right Ventricle: The right ventricular size is normal. No increase in right ventricular wall thickness. Right ventricular systolic function is normal. There is moderately elevated pulmonary artery systolic pressure. The tricuspid regurgitant velocity is 3.55 m/s, and with an assumed right atrial pressure of 8 mmHg, the estimated right ventricular systolic pressure is 35.3 mmHg. Left Atrium: Left atrial size was mildly dilated. Right Atrium: Right atrial size was normal in size. Pericardium: There is no evidence of pericardial effusion.  Mitral Valve: The mitral valve is normal in structure. Mild mitral valve regurgitation. No evidence of mitral valve stenosis. Tricuspid Valve: The tricuspid valve is normal in structure. Tricuspid valve regurgitation is mild to moderate. No evidence of tricuspid stenosis. Aortic Valve: The aortic valve is normal in structure. There is moderate calcification of the aortic valve. There is moderate thickening of the aortic valve. Aortic valve regurgitation is not visualized. Mild aortic stenosis is present. Aortic valve mean  gradient measures 9.0 mmHg. Aortic valve peak gradient measures 17.5 mmHg. Aortic valve area, by VTI measures 1.20 cm. Pulmonic Valve: The pulmonic valve was normal in structure. Pulmonic valve regurgitation is mild. No evidence of pulmonic stenosis. Aorta: The aortic root is normal in size and structure. Venous: The inferior vena cava is dilated in size with greater than 50% respiratory variability, suggesting right atrial pressure of 8 mmHg. IAS/Shunts: No atrial level shunt detected by color flow Doppler.  LEFT VENTRICLE PLAX 2D LVIDd:         4.40 cm     Diastology LVIDs:         3.10 cm     LV e' medial:    6.85 cm/s LV PW:         1.10 cm     LV E/e' medial:  18.0 LV IVS:        1.10 cm     LV e' lateral:   11.20 cm/s LVOT diam:     1.70 cm     LV E/e' lateral: 11.0 LV SV:         53 LV SV Index:   27 LVOT Area:     2.27 cm   LV Volumes (MOD) LV vol d, MOD A4C: 90.0 ml LV vol s, MOD A4C: 36.3 ml LV SV MOD A4C:     90.0 ml RIGHT VENTRICLE          IVC RV Basal diam:  4.10 cm  IVC diam: 2.10 cm RV Mid diam:    3.60 cm LEFT ATRIUM             Index       RIGHT ATRIUM           Index LA diam:        4.10 cm 2.09 cm/m  RA Area:     20.00 cm LA Vol (A2C):   89.8 ml 45.82 ml/m RA Volume:   59.20 ml  30.21 ml/m LA Vol (A4C):   33.2 ml 16.94 ml/m LA Biplane Vol: 57.4 ml 29.29 ml/m  AORTIC VALVE AV Area (Vmax):    1.15 cm AV Area (Vmean):   1.20 cm AV Area (VTI):     1.20 cm AV Vmax:           209.00 cm/s AV Vmean:          133.333 cm/s AV VTI:            0.443 m AV Peak Grad:      17.5 mmHg AV Mean Grad:      9.0 mmHg LVOT Vmax:         106.00 cm/s LVOT Vmean:        70.400 cm/s LVOT VTI:          0.235 m LVOT/AV VTI ratio: 0.53  AORTA Ao Root diam: 2.80 cm Ao Asc diam:  2.80 cm MITRAL VALVE                TRICUSPID VALVE MV  Area (PHT): 5.42 cm     TR Peak grad:   50.4 mmHg MV Decel Time: 140 msec     TR Vmax:        355.00 cm/s MV E velocity: 123.00 cm/s MV A velocity: 104.00 cm/s  SHUNTS MV E/A ratio:  1.18         Systemic VTI:  0.24 m                             Systemic Diam: 1.70 cm Ena Dawley MD Electronically signed by Ena Dawley MD Signature Date/Time: 04/01/2020/3:03:40 PM    Final    VAS [place] LOWER EXTREMITY VENOUS (DVT)  Result Date: 04/02/2020  Lower Venous DVT Study Indications: SOB.  Risk Factors: Immobility. Anticoagulation: Lovenox. Comparison Study: 05-26-2018 LT lower extremity venous study available. Performing Technologist: Darlin Coco, RDMS  Examination Guidelines: A complete evaluation includes B-mode imaging, spectral Doppler, color Doppler, and power Doppler as needed of all accessible portions of each vessel. Bilateral testing is considered an integral part of a complete examination. Limited examinations for reoccurring indications may be performed as noted. The reflux portion of the exam is  performed with the patient in reverse Trendelenburg.  +---------+---------------+---------+-----------+----------+--------------+ RIGHT    CompressibilityPhasicitySpontaneityPropertiesThrombus Aging +---------+---------------+---------+-----------+----------+--------------+ CFV      Full           Yes      Yes                                 +---------+---------------+---------+-----------+----------+--------------+ SFJ      Full                                                        +---------+---------------+---------+-----------+----------+--------------+ FV Prox  Full                                                        +---------+---------------+---------+-----------+----------+--------------+ FV Mid   Full                                                        +---------+---------------+---------+-----------+----------+--------------+ FV DistalFull                                                        +---------+---------------+---------+-----------+----------+--------------+ PFV      Full                                                        +---------+---------------+---------+-----------+----------+--------------+ POP      Full  Yes      Yes                                 +---------+---------------+---------+-----------+----------+--------------+ PTV      Full                                                        +---------+---------------+---------+-----------+----------+--------------+ PERO     Full                                                        +---------+---------------+---------+-----------+----------+--------------+   +---------+---------------+---------+-----------+----------+--------------+ LEFT     CompressibilityPhasicitySpontaneityPropertiesThrombus Aging +---------+---------------+---------+-----------+----------+--------------+ CFV      Full           Yes      Yes                                  +---------+---------------+---------+-----------+----------+--------------+ SFJ      Full                                                        +---------+---------------+---------+-----------+----------+--------------+ FV Prox  Full                                                        +---------+---------------+---------+-----------+----------+--------------+ FV Mid   Full                                                        +---------+---------------+---------+-----------+----------+--------------+ FV DistalFull                                                        +---------+---------------+---------+-----------+----------+--------------+ PFV      Full                                                        +---------+---------------+---------+-----------+----------+--------------+ POP      Full           Yes      Yes                                 +---------+---------------+---------+-----------+----------+--------------+ PTV  Full                                                        +---------+---------------+---------+-----------+----------+--------------+ PERO     Full                                                        +---------+---------------+---------+-----------+----------+--------------+     Summary: RIGHT: - There is no evidence of deep vein thrombosis in the lower extremity.  - No cystic structure found in the popliteal fossa.  LEFT: - There is no evidence of deep vein thrombosis in the lower extremity.  - No cystic structure found in the popliteal fossa.  *See table(s) above for measurements and observations. Electronically signed by Ruta Hinds MD on 04/02/2020 at 5:25:05 PM.    Final    CT Maxillofacial Wo Contrast  Result Date: 03/31/2020 CLINICAL DATA:  Unwitnessed fall. EXAM: CT HEAD WITHOUT CONTRAST CT MAXILLOFACIAL WITHOUT CONTRAST CT CERVICAL SPINE WITHOUT CONTRAST TECHNIQUE: Multidetector CT imaging of the  head, cervical spine, and maxillofacial structures were performed using the standard protocol without intravenous contrast. Multiplanar CT image reconstructions of the cervical spine and maxillofacial structures were also generated. COMPARISON:  None. FINDINGS: CT HEAD FINDINGS Brain: There is no evidence for acute hemorrhage, hydrocephalus, mass lesion, or abnormal extra-axial fluid collection. No definite CT evidence for acute infarction. Diffuse loss of parenchymal volume is consistent with atrophy. Patchy low attenuation in the deep hemispheric and periventricular white matter is nonspecific, but likely reflects chronic microvascular ischemic demyelination. Vascular: No hyperdense vessel or unexpected calcification. Skull: No evidence for fracture. No worrisome lytic or sclerotic lesion. Other: None. CT MAXILLOFACIAL FINDINGS Osseous: Age indeterminate bilateral nasal bone fractures evident. No other acute facial bone fracture. Mandible is intact. Temporomandibular joints are located with some mild degenerative changes in the left TMJ. Orbits: Negative. No traumatic or inflammatory finding. Sinuses: Air-fluid level noted right maxillary sinus. Remaining paranasal sinuses and mastoid air cells are clear. Soft tissues: Negative. CT CERVICAL SPINE FINDINGS Alignment: Reversal of normal cervical lordosis without subluxation. Skull base and vertebrae: No acute fracture. No primary bone lesion or focal pathologic process. Soft tissues and spinal canal: No prevertebral fluid or swelling. No visible canal hematoma. Disc levels: Loss of disc height noted diffusely in the cervical spine from C4-5 down to C6-7 with multilevel associated endplate spurring. Upper chest: Aberrant origin right subclavian artery noted. Emphysema noted in the lung apices with 10 mm irregular right apical nodule visible on image 92 of series 4. Other: None. IMPRESSION: 1. No acute intracranial abnormality. Atrophy with chronic small vessel white  matter ischemic disease. 2. Age indeterminate bilateral nasal bone fractures. No other acute facial bone fracture. 3. Air-fluid level in the right maxillary sinus. This could be related to hemorrhage in the setting of acute trauma or acute sinusitis. 4. Degenerative changes in the cervical spine without fracture. 5. 10 mm irregular right apical pulmonary nodule. Follow-up CT chest without contrast in 3 months recommended to ensure stability as this may be related to scarring. PET-CT could also be used to further evaluate as clinically warranted. 6. Aberrant origin right subclavian artery. 7. Reversal  of normal cervical lordosis. This can be related to patient positioning, muscle spasm or soft tissue injury. 8. Emphysema (ICD10-J43.9). Electronically Signed   By: Misty Stanley M.D.   On: 03/31/2020 08:05      Subjective: Patient seen and examined bedside, resting comfortably.  Discharging to SNF.  No other complaints or concerns at this time.  Denies headache, no visual changes, no chest pain, palpitations, no abdominal pain, no weakness, no fatigue, no paresthesias.  No acute events overnight per nursing staff.  Discharge Exam: Vitals:   04/13/20 0835 04/13/20 1341  BP: (!) 163/51 (!) 135/56  Pulse: 61 70  Resp: 20 20  Temp: 98 F (36.7 C) 98.1 F (36.7 C)  SpO2: 98% 91%   Vitals:   04/12/20 2006 04/13/20 0508 04/13/20 0835 04/13/20 1341  BP: (!) 159/63 132/62 (!) 163/51 (!) 135/56  Pulse: 93 90 61 70  Resp: _0 Temp: 98.6 F (37 C) 97.6 F (36.4 C) 98 F (36.7 C) 98.1 F (36.7 C)  TempSrc:  Oral Oral   SpO2: 93% 94% 98% 91%  Weight:  78.5 kg    Height:        General: Pt is alert, awake, not in acute distress Cardiovascular: RRR, S1/S2 +, no rubs, no gallops Respiratory: CTA bilaterally, no wheezing, no rhonchi, on 4-6 L nasal cannula at rest Abdominal: Soft, NT, ND, bowel sounds + Extremities: no edema, no cyanosis    The results of significant diagnostics from  this hospitalization (including imaging, microbiology, ancillary and laboratory) are listed below for reference.     Microbiology: No results found for this or any previous visit (from the past 240 hour(s)).   Labs: BNP (last 3 results) Recent Labs    04/04/20 0047 04/05/20 0238 04/06/20 0146  BNP 756.9* 224.9* 92.4   Basic Metabolic Panel: Recent Labs  Lab 04/07/20 0109 04/08/20 0554 04/11/20 0318 04/13/20 0101  NA 137 137 137 138  K 4.2 3.8 4.6 4.8  CL 92* 93* 100 102  CO2 34* 33* 32 29  GLUCOSE 112* 142* 122* 144*  BUN 47* 52* 34* 26*  CREATININE 2.28* 1.97* 1.36* 1.32*  CALCIUM 8.6* 8.3* 8.5* 8.9  MG  --  2.1  --   --   PHOS 4.5  --   --   --    Liver Function Tests: Recent Labs  Lab 04/07/20 0109  ALBUMIN 2.6*   No results for input(s): LIPASE, AMYLASE in the last 168 hours. No results for input(s): AMMONIA in the last 168 hours. CBC: Recent Labs  Lab 04/07/20 0109 04/11/20 0318 04/13/20 0101  WBC 9.2 6.7 7.1  NEUTROABS 6.0  --   --   HGB 8.3* 7.8* 8.0*  HCT 27.8* 26.2* 27.8*  MCV 84.8 88.5 89.4  PLT 233 216 224   Cardiac Enzymes: No results for input(s): CKTOTAL, CKMB, CKMBINDEX, TROPONINI in the last 168 hours. BNP: Invalid input(s): POCBNP CBG: Recent Labs  Lab 04/12/20 1212 04/12/20 1654 04/12/20 2022 04/13/20 0735 04/13/20 1200  GLUCAP 81 126* 217* 92 162*   D-Dimer No results for input(s): DDIMER in the last 72 hours. Hgb A1c No results for input(s): HGBA1C in the last 72 hours. Lipid Profile No results for input(s): CHOL, HDL, LDLCALC, TRIG, CHOLHDL, LDLDIRECT in the last 72 hours. Thyroid function studies No results for input(s): TSH, T4TOTAL, T3FREE, THYROIDAB in the last 72 hours.  Invalid input(s): FREET3 Anemia work up No results for input(s): VITAMINB12, FOLATE, FERRITIN, TIBC,  IRON, RETICCTPCT in the last 72 hours. Urinalysis    Component Value Date/Time   COLORURINE STRAW (A) 04/01/2020 0541   APPEARANCEUR CLEAR  04/01/2020 0541   LABSPEC 1.008 04/01/2020 0541   PHURINE 5.0 04/01/2020 0541   GLUCOSEU NEGATIVE 04/01/2020 0541   GLUCOSEU NEGATIVE 10/16/2014 1203   HGBUR SMALL (A) 04/01/2020 0541   HGBUR negative 10/06/2006 0756   BILIRUBINUR NEGATIVE 04/01/2020 0541   BILIRUBINUR negative 09/09/2017 1217   KETONESUR NEGATIVE 04/01/2020 0541   PROTEINUR NEGATIVE 04/01/2020 0541   UROBILINOGEN 0.2 09/09/2017 1217   UROBILINOGEN 1.0 10/16/2014 1203   NITRITE NEGATIVE 04/01/2020 0541   LEUKOCYTESUR NEGATIVE 04/01/2020 0541   Sepsis Labs Invalid input(s): PROCALCITONIN,  WBC,  LACTICIDVEN Microbiology No results found for this or any previous visit (from the past 240 hour(s)).   Time coordinating discharge: Over 30 minutes  SIGNED:   Donnamarie Poag [place] DO  [facility] 04/13/2020, 2:10 PM

## 2020-04-13 NOTE — TOC Progression Note (Signed)
Transition of Care [facility]) - Progression Note    Patient Details  Name: Bradley Johns MRN: [id number] Date of Birth: 01-26-1935  [facility]) CM/SW Contact  Joanne Chars, LCSW Phone Number: 04/13/2020, 9:20 AM  Clinical Narrative:   CSW spoke with pt wife Bradley Johns.  She asked that CSW inquire about admission to [facility].  CSW spoke with [facility] at [facility] who did make bed offer.  She will initiate authorization and speak with wife about signing paperwork.     Expected Discharge Plan: [place] Barriers to Discharge: No Barriers Identified  Expected Discharge Plan and Services Expected Discharge Plan: [place] Choice: [place] arrangements for the past 2 months: Single Family Home                 DME Arranged: 3-N-1,[place] DME Agency: [facility] Date DME Agency Contacted: 04/06/20     HH Arranged: PT,OT [place] Agency: [place] Date HH Agency Contacted: 04/05/20 Time [place]: 1528 Representative spoke with at [place]: [place] Determinants of Health ([place]) Interventions    Readmission Risk Interventions No flowsheet data found.

## 2020-04-13 NOTE — Progress Notes (Signed)
Occupational Therapy Treatment Patient Details Name: Bradley Johns MRN: [id number] DOB: Mar 21, 1935 Today's Date: 04/13/2020    History of present illness Bradley Johns is a 86 y.o. admitted with syncope. Pt with age indeterminate nasal fx. Worsening hypoxemia 12/27. 12/28 with PEA arrest and transfer to ICU on bipap. 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 Pt continues to present with decreased activity tolerance, dyspnea with exertion, and generalized weakness impacting pts ability to complete BADLs. Pt able to complete functional mobility greater than a household distance ( ~ 50 ft ) with RW and min guard assist on 6L HFNC, difficult obtain accurate O2 reading with sats fluctuating from 95-83% during mobility. Pt currently requires set- up- supervision for UB ADLs and minguard for LB ADLs. Pt left sitting in chair at sink washing up with NT , able to titrate O2 back down to 4L with sats 90%. Pt would continue to benefit from skilled occupational therapy while admitted and after d/c to address the below listed limitations in order to improve overall functional mobility and facilitate independence with BADL participation. DC plan remains appropriate, will follow acutely per POC.     Follow Up Recommendations  SNF    Equipment Recommendations  3 in 1 bedside commode    Recommendations for Other Services      Precautions / Restrictions Precautions Precautions: Fall;Other (comment) Precaution Comments: watch sats Restrictions Weight Bearing Restrictions: No       Mobility Bed Mobility               General bed mobility comments: sitting EOB upon arrival  Transfers Overall transfer level: Needs assistance Equipment used: Rolling walker (2 wheeled) Transfers: Sit to/from Stand Sit to Stand: Supervision         General transfer comment: pt  request to perform sit<>stand without physical assist, supervision for safety and cues for hand placement    Balance Overall balance assessment: Needs assistance Sitting-balance support: No upper extremity supported;Feet supported Sitting balance-Leahy Scale: Good Sitting balance - Comments: sitting EOB with no UE supported upon arrival   Standing balance support: No upper extremity supported;During functional activity Standing balance-Leahy Scale: Fair Standing balance comment: able to static stand with no UE support to don mask                           ADL either performed or assessed with clinical judgement   ADL Overall ADL's : Needs assistance/impaired     Grooming: Oral care;Wash/dry face;Sitting;Supervision/safety;Set up Grooming Details (indicate cue type and reason): from sitting in chair at sink             Lower Body Dressing: Sitting/lateral leans;Min guard Lower Body Dressing Details (indicate cue type and reason): when reaching out of BOS to adjust socks Toilet Transfer: Min Marine scientist Details (indicate cue type and reason): simulated via functional mobility, min guard for safety ~ 25ft with RW         Functional mobility during ADLs: Min guard;Rolling walker General ADL Comments: pt continues to present with decreased activity tolerance, dyspnea with exertion and generalized weakness     Vision       Perception     Praxis      Cognition Arousal/Alertness: Awake/alert Behavior During Therapy: WFL for tasks assessed/performed Overall Cognitive Status: Within  Functional Limits for tasks assessed                                          Exercises     Shoulder Instructions       General Comments pt on 4L HFNC upon arrival with sats 83% at rest, increased pt to 6L for ambulation, difficult to obtain accurate reading on pulse ox with sat readings ranging from 95-83% able to titrate back to 4L at  end of session wth sats 90% when exiting session    Pertinent Vitals/ Pain       Pain Assessment: Faces Faces Pain Scale: Hurts a little bit Pain Location: bil ankles from gout Pain Descriptors / Indicators: Aching Pain Intervention(s): Monitored during session;Repositioned  Home Living                                          Prior Functioning/Environment              Frequency  Min 2X/week        Progress Toward Goals  OT Goals(current goals can now be found in the care plan section)  Progress towards OT goals: Progressing toward goals  Acute Rehab OT Goals Patient Stated Goal: to go home OT Goal Formulation: With patient Time For Goal Achievement: 04/18/20 Potential to Achieve Goals: Good  Plan Discharge plan remains appropriate;Frequency remains appropriate    Co-evaluation                 AM-PAC OT "6 Clicks" Daily Activity     Outcome Measure   Help from another person eating meals?: None Help from another person taking care of personal grooming?: A Little Help from another person toileting, which includes using toliet, bedpan, or urinal?: A Little Help from another person bathing (including washing, rinsing, drying)?: A Little Help from another person to put on and taking off regular upper body clothing?: A Little Help from another person to put on and taking off regular lower body clothing?: A Little 6 Click Score: 19    End of Session Equipment Utilized During Treatment: Gait belt;Rolling walker;Oxygen;Other (comment) (4L - 6L HFNC)  OT Visit Diagnosis: Unsteadiness on feet (R26.81);Other abnormalities of gait and mobility (R26.89);Muscle weakness (generalized) (M62.81)   Activity Tolerance Patient tolerated treatment well   Patient Left in chair;Other (comment) (sitting at sink washing up with NT)   Nurse Communication Mobility status        Time: 5885-0277 OT Time Calculation (min): 35 min  Charges: OT General  Charges $OT Visit: 1 Visit OT Treatments $Self Care/Home Management : 8-22 mins $Therapeutic Activity: 8-22 mins  Harley [place]., COTA/L Acute Rehabilitation Services [phone]    Precious Haws 04/13/2020, 12:22 PM

## 2020-04-14 DIAGNOSIS — M109 Gout, unspecified: Secondary | ICD-10-CM | POA: Diagnosis not present

## 2020-04-14 DIAGNOSIS — J9611 Chronic respiratory failure with hypoxia: Secondary | ICD-10-CM | POA: Diagnosis not present

## 2020-04-14 DIAGNOSIS — R0902 Hypoxemia: Secondary | ICD-10-CM | POA: Diagnosis not present

## 2020-04-14 DIAGNOSIS — M10072 Idiopathic gout, left ankle and foot: Secondary | ICD-10-CM | POA: Diagnosis not present

## 2020-04-14 DIAGNOSIS — Z87891 Personal history of nicotine dependence: Secondary | ICD-10-CM | POA: Diagnosis not present

## 2020-04-14 DIAGNOSIS — Z79899 Other long term (current) drug therapy: Secondary | ICD-10-CM | POA: Diagnosis not present

## 2020-04-14 DIAGNOSIS — N178 Other acute kidney failure: Secondary | ICD-10-CM | POA: Diagnosis not present

## 2020-04-14 DIAGNOSIS — Z7984 Long term (current) use of oral hypoglycemic drugs: Secondary | ICD-10-CM | POA: Diagnosis not present

## 2020-04-14 DIAGNOSIS — D508 Other iron deficiency anemias: Secondary | ICD-10-CM | POA: Diagnosis not present

## 2020-04-14 DIAGNOSIS — D509 Iron deficiency anemia, unspecified: Secondary | ICD-10-CM | POA: Diagnosis not present

## 2020-04-14 DIAGNOSIS — J189 Pneumonia, unspecified organism: Secondary | ICD-10-CM | POA: Diagnosis not present

## 2020-04-14 DIAGNOSIS — I469 Cardiac arrest, cause unspecified: Secondary | ICD-10-CM | POA: Diagnosis not present

## 2020-04-14 DIAGNOSIS — E119 Type 2 diabetes mellitus without complications: Secondary | ICD-10-CM | POA: Diagnosis not present

## 2020-04-14 DIAGNOSIS — Z7982 Long term (current) use of aspirin: Secondary | ICD-10-CM | POA: Diagnosis not present

## 2020-04-14 DIAGNOSIS — R319 Hematuria, unspecified: Secondary | ICD-10-CM | POA: Diagnosis not present

## 2020-04-14 DIAGNOSIS — I5033 Acute on chronic diastolic (congestive) heart failure: Secondary | ICD-10-CM | POA: Diagnosis not present

## 2020-04-14 DIAGNOSIS — E785 Hyperlipidemia, unspecified: Secondary | ICD-10-CM | POA: Diagnosis not present

## 2020-04-14 DIAGNOSIS — D649 Anemia, unspecified: Secondary | ICD-10-CM | POA: Diagnosis not present

## 2020-04-14 DIAGNOSIS — J449 Chronic obstructive pulmonary disease, unspecified: Secondary | ICD-10-CM | POA: Diagnosis not present

## 2020-04-14 DIAGNOSIS — N289 Disorder of kidney and ureter, unspecified: Secondary | ICD-10-CM | POA: Diagnosis not present

## 2020-04-14 DIAGNOSIS — I509 Heart failure, unspecified: Secondary | ICD-10-CM | POA: Diagnosis not present

## 2020-04-14 DIAGNOSIS — M2550 Pain in unspecified joint: Secondary | ICD-10-CM | POA: Diagnosis not present

## 2020-04-14 DIAGNOSIS — R0602 Shortness of breath: Secondary | ICD-10-CM | POA: Diagnosis not present

## 2020-04-14 DIAGNOSIS — R7889 Finding of other specified substances, not normally found in blood: Secondary | ICD-10-CM | POA: Diagnosis not present

## 2020-04-14 DIAGNOSIS — R5381 Other malaise: Secondary | ICD-10-CM | POA: Diagnosis not present

## 2020-04-14 DIAGNOSIS — J439 Emphysema, unspecified: Secondary | ICD-10-CM | POA: Diagnosis not present

## 2020-04-14 DIAGNOSIS — N179 Acute kidney failure, unspecified: Secondary | ICD-10-CM | POA: Diagnosis not present

## 2020-04-14 DIAGNOSIS — Z7401 Bed confinement status: Secondary | ICD-10-CM | POA: Diagnosis not present

## 2020-04-14 DIAGNOSIS — I10 Essential (primary) hypertension: Secondary | ICD-10-CM | POA: Diagnosis not present

## 2020-04-14 DIAGNOSIS — D500 Iron deficiency anemia secondary to blood loss (chronic): Secondary | ICD-10-CM | POA: Diagnosis not present

## 2020-04-14 DIAGNOSIS — R0982 Postnasal drip: Secondary | ICD-10-CM | POA: Diagnosis not present

## 2020-04-14 DIAGNOSIS — J810 Acute pulmonary edema: Secondary | ICD-10-CM | POA: Diagnosis not present

## 2020-04-14 NOTE — Progress Notes (Signed)
Patient is A/Ox4, not is distress. Vital signs checked, Removed intact IV line. Called  [facility] to make sure they are aware patient is on his way to the facility via [place]. Along with pt are his Oxygen tank, clothes and eyeglasses.

## 2020-04-14 NOTE — Progress Notes (Signed)
Called [facility] after hours contact number ([phone]) and Notify them that COVID-19 test came back negative and that pt. Is ready to be transported to [facility].

## 2020-04-15 ENCOUNTER — Other Ambulatory Visit: Payer: Self-pay

## 2020-04-15 ENCOUNTER — Encounter (HOSPITAL_COMMUNITY): Payer: Self-pay | Admitting: Emergency Medicine

## 2020-04-15 ENCOUNTER — Emergency Department (HOSPITAL_COMMUNITY)
Admission: EM | Admit: 2020-04-15 | Discharge: 2020-04-16 | Disposition: A | Discharge status: Home / Self Care | Payer: Medicare HMO | Attending: Emergency Medicine | Admitting: Emergency Medicine

## 2020-04-15 DIAGNOSIS — Z79899 Other long term (current) drug therapy: Secondary | ICD-10-CM | POA: Diagnosis not present

## 2020-04-15 DIAGNOSIS — D649 Anemia, unspecified: Secondary | ICD-10-CM

## 2020-04-15 DIAGNOSIS — R319 Hematuria, unspecified: Secondary | ICD-10-CM | POA: Diagnosis not present

## 2020-04-15 DIAGNOSIS — E119 Type 2 diabetes mellitus without complications: Secondary | ICD-10-CM | POA: Diagnosis not present

## 2020-04-15 DIAGNOSIS — N289 Disorder of kidney and ureter, unspecified: Secondary | ICD-10-CM

## 2020-04-15 DIAGNOSIS — J449 Chronic obstructive pulmonary disease, unspecified: Secondary | ICD-10-CM | POA: Insufficient documentation

## 2020-04-15 DIAGNOSIS — M10072 Idiopathic gout, left ankle and foot: Secondary | ICD-10-CM | POA: Insufficient documentation

## 2020-04-15 DIAGNOSIS — Z7982 Long term (current) use of aspirin: Secondary | ICD-10-CM | POA: Diagnosis not present

## 2020-04-15 DIAGNOSIS — Z87891 Personal history of nicotine dependence: Secondary | ICD-10-CM | POA: Insufficient documentation

## 2020-04-15 DIAGNOSIS — N178 Other acute kidney failure: Secondary | ICD-10-CM | POA: Insufficient documentation

## 2020-04-15 DIAGNOSIS — R7889 Finding of other specified substances, not normally found in blood: Secondary | ICD-10-CM | POA: Diagnosis not present

## 2020-04-15 DIAGNOSIS — I10 Essential (primary) hypertension: Secondary | ICD-10-CM | POA: Diagnosis not present

## 2020-04-15 DIAGNOSIS — D509 Iron deficiency anemia, unspecified: Secondary | ICD-10-CM | POA: Diagnosis not present

## 2020-04-15 DIAGNOSIS — Z7984 Long term (current) use of oral hypoglycemic drugs: Secondary | ICD-10-CM | POA: Diagnosis not present

## 2020-04-15 LAB — URINALYSIS, ROUTINE W REFLEX MICROSCOPIC

## 2020-04-15 LAB — URINALYSIS, MICROSCOPIC (REFLEX): RBC / HPF: >50 RBC/hpf (ref 0–5)

## 2020-04-15 LAB — CBG MONITORING, ED: Glucose-Capillary: 93 mg/dL (ref 70–99)

## 2020-04-15 NOTE — ED Triage Notes (Signed)
Pt to triage via [place] from [facility].  Discharged from hospital yesterday.  Reports urine black colored today.  Denies pain.  Wears 4 liters [place].

## 2020-04-15 NOTE — ED Notes (Signed)
Updated pt on wait for treatment room.  Pt states he hasn't urinated since being here.  Pt is aware of need for urine specimen.  Denies any changes.  States it feels better when sitting up.

## 2020-04-16 DIAGNOSIS — I10 Essential (primary) hypertension: Secondary | ICD-10-CM | POA: Diagnosis not present

## 2020-04-16 DIAGNOSIS — M2550 Pain in unspecified joint: Secondary | ICD-10-CM | POA: Diagnosis not present

## 2020-04-16 DIAGNOSIS — J439 Emphysema, unspecified: Secondary | ICD-10-CM | POA: Diagnosis not present

## 2020-04-16 DIAGNOSIS — R0902 Hypoxemia: Secondary | ICD-10-CM | POA: Diagnosis not present

## 2020-04-16 DIAGNOSIS — E785 Hyperlipidemia, unspecified: Secondary | ICD-10-CM | POA: Diagnosis not present

## 2020-04-16 DIAGNOSIS — Z7401 Bed confinement status: Secondary | ICD-10-CM | POA: Diagnosis not present

## 2020-04-16 MED ORDER — CEPHALEXIN 250 MG PO CAPS
500.0000 mg | ORAL_CAPSULE | Freq: Once | ORAL | Status: AC
  Administered 2020-04-16: 500 mg via ORAL
  Filled 2020-04-16: qty 2

## 2020-04-16 MED ORDER — COLCHICINE 0.6 MG PO TABS: 0.6000 mg | ORAL_TABLET | Freq: Two times a day (BID) | ORAL | 0 refills | Status: DC | PRN

## 2020-04-16 MED ORDER — CEPHALEXIN 500 MG PO CAPS: 500.0000 mg | ORAL_CAPSULE | Freq: Three times a day (TID) | ORAL | 0 refills | Status: DC

## 2020-04-16 NOTE — ED Notes (Signed)
RN called report to RN at [facility], pt's living facility

## 2020-04-16 NOTE — ED Provider Notes (Incomplete)
[facility] EMERGENCY DEPARTMENT Provider Note   CSN: [id number] Arrival date & time: 04/15/20  1142   History Chief Complaint  Patient presents with  . dark colored urine    Bradley Johns is a 86 y.o. male.  The history is provided by the patient.    Past Medical History:  Diagnosis Date  . Arthritis   . Benign prostatic hypertrophy   . COPD (chronic obstructive pulmonary disease) ([place])    24/7 O2  . Diabetes mellitus    per Dr Posey Pronto  . Epidermal cyst of face    left lower lateral cheek  . Gout   . Hyperlipidemia   . Hypertension   . Pulmonary fibrosis ([place]) 2014  . Sleep apnea     Patient Active Problem List   Diagnosis Date Noted  . Hypoxia   . Acute respiratory distress   . Cardiac arrest ([place])   . Symptomatic anemia 03/31/2020  . Syncope and collapse 03/31/2020  . AKI (acute kidney injury) ([place]) 03/31/2020  . Chronic respiratory failure with hypoxia ([place]) 06/03/2018  . Anemia associated with acute blood loss 05/20/2018  . Acute GI bleeding 05/17/2018  . PCP NOTES >>>>>>>>>>>>>>>. 12/30/2014  . Pulmonary fibrosis ([place]) 12/14/2012  . Cubital tunnel syndrome on left 11/14/2012  . Dyspnea on exertion 10/11/2012  . Trigger finger of both hands 09/26/2011  . Annual physical exam 03/18/2011  . BPH (benign prostatic hyperplasia) 07/11/2008  . Osteoarthritis 04/18/2008  . Hyperlipidemia 10/06/2006  . Diabetes mellitus type 2 in nonobese ([place]) 04/29/2006  . GOUT 04/29/2006  . Essential hypertension 04/29/2006  . COPD with emphysema, on O2 04/29/2006  . Sleep apnea 04/29/2006    Past Surgical History:  Procedure Laterality Date  . COLONOSCOPY WITH PROPOFOL N/A 05/18/2018   Procedure: COLONOSCOPY WITH PROPOFOL;  Surgeon: Carol Ada, MD;  Location: [facility];  Service: Endoscopy;  Laterality: N/A;  . POLYPECTOMY  05/18/2018   Procedure: POLYPECTOMY;  Surgeon: Carol Ada, MD;  Location: [facility];  Service: Endoscopy;;  . TOE  SURGERY         Family History  Problem Relation Age of Onset  . Liver disease Mother   . Coronary artery disease Father 10  . Diabetes Sister   . Cancer Brother        type?  Marland Kitchen Hypertension Neg Hx   . Hyperlipidemia Neg Hx   . Heart attack Neg Hx   . Prostate cancer Neg Hx   . Colon cancer Neg Hx     Social History   Tobacco Use  . Smoking status: Former Smoker    Packs/day: 1.50    Years: 40.00    Pack years: 60.00    Types: Cigarettes    Quit date: 04/07/1996    Years since quitting: 24.0  . Smokeless tobacco: Never Used  Substance Use Topics  . Alcohol use: Yes    Comment: occassional beer  . Drug use: No    Home Medications Prior to Admission medications   Medication Sig Start Date End Date Taking? Authorizing Provider  cephALEXin (KEFLEX) 500 MG capsule Take 1 capsule (500 mg total) by mouth 3 (three) times daily. 0/93/23  Yes Delora Fuel, MD  ACCU-CHEK AVIVA PLUS test strip Reported on 06/20/2015 12/13/14   [provider]  ACCU-CHEK SOFTCLIX LANCETS lancets Reported on 06/20/2015 12/13/14   [provider]  aspirin 81 MG tablet Take 1 tablet (81 mg total) by mouth daily. Start taking 05/27/18 05/27/18   Pokhrel,  Laxman, MD  atorvastatin (LIPITOR) 80 MG tablet Take 1 tablet (80 mg total) by mouth daily. Patient taking differently: Take 80 mg by mouth every evening. 04/15/17   Colon Branch, MD  B-D UF III MINI PEN NEEDLES 31G X 5 MM MISC Reported on 06/20/2015 11/07/14   [provider]  Blood Glucose Monitoring Suppl (ACCU-CHEK AVIVA PLUS) W/DEVICE KIT Reported on 06/20/2015 10/27/14   [provider]  carvedilol (COREG) 3.125 MG tablet Take 1 tablet (3.125 mg total) by mouth 2 (two) times daily with a meal. 04/13/20   [place] Donnamarie Poag, DO  colchicine 0.6 MG tablet Take 1 tablet (0.6 mg total) by mouth 2 (two) times daily as needed. 10/30/34   Delora Fuel, MD  doxazosin (CARDURA) 4 MG tablet Take 1 tablet (4 mg total) by mouth at bedtime.  03/01/19   Colon Branch, MD  ferrous sulfate 325 (65 FE) MG tablet Take 1 tablet (325 mg total) by mouth daily with breakfast. 04/14/20   [place] Donnamarie Poag, DO  ketoconazole (NIZORAL) 2 % cream Apply 1 application topically daily. Patient taking differently: Apply 1 application topically daily as needed (moisture around groin). 03/24/18   Colon Branch, MD  metFORMIN (GLUCOPHAGE) 500 MG tablet Take 500 mg by mouth 2 (two) times daily with a meal.    [provider]  Multiple Vitamin (MULTIVITAMIN) tablet Take 1 tablet by mouth daily.    [provider]  OXYGEN Inhale into the lungs. 3L Nocturnal and PRN for Hypoxemia    [provider]  pioglitazone (ACTOS) 45 MG tablet Take 45 mg by mouth daily.    [provider]  predniSONE (DELTASONE) 20 MG tablet Take 2 tablets (40 mg total) by mouth daily with breakfast for 5 days. 04/14/20 04/19/20  [place] Donnamarie Poag, DO  saline (AYR) GEL Place 1 application into both nostrils every 12 (twelve) hours. 04/13/20   [place] Donnamarie Poag, DO  sodium chloride (OCEAN) 0.65 % SOLN nasal spray Place 1 spray into both nostrils as needed for congestion. 04/13/20   [place] Eric J, DO    Allergies    Patient has no known allergies.  Review of Systems   Review of Systems  All other systems reviewed and are negative.   Physical Exam Updated Vital Signs BP (!) 172/82 (BP Location: Left Arm)   Pulse 96   Temp 97.9 F (36.6 C) (Oral)   Resp 16   Ht $R'5\' 8"'YZ$  (1.727 m)   Wt 81.2 kg   SpO2 91%   BMI 27.22 kg/m   Physical Exam Vitals and nursing note reviewed.   *** year old ***male, resting comfortably and in no acute distress. Vital signs are ***. Oxygen saturation is ***%, which is normal. Head is normocephalic and atraumatic. PERRLA, EOMI. Oropharynx is clear. Neck is nontender and supple without adenopathy or JVD. Back is nontender and there is no CVA tenderness. Lungs are clear without rales, wheezes, or rhonchi. Chest is  nontender. Heart has regular rate and rhythm without murmur. Abdomen is soft, flat, nontender without masses or hepatosplenomegaly and peristalsis is normoactive. Extremities have no cyanosis or edema, full range of motion is present. Skin is warm and dry without rash. Neurologic: Mental status is normal, cranial nerves are intact, there are no motor or sensory deficits.  ED Results / Procedures / Treatments   Labs (all labs ordered are listed, but only abnormal results are displayed) Labs Reviewed  URINALYSIS, ROUTINE W REFLEX MICROSCOPIC - Abnormal; Notable  for the following components:      Result Value   Color, Urine RED (*)    APPearance TURBID (*)    Glucose, UA   (*)    Value: TEST NOT REPORTED DUE TO COLOR INTERFERENCE OF URINE PIGMENT   Hgb urine dipstick   (*)    Value: TEST NOT REPORTED DUE TO COLOR INTERFERENCE OF URINE PIGMENT   Bilirubin Urine   (*)    Value: TEST NOT REPORTED DUE TO COLOR INTERFERENCE OF URINE PIGMENT   Ketones, ur   (*)    Value: TEST NOT REPORTED DUE TO COLOR INTERFERENCE OF URINE PIGMENT   Protein, ur   (*)    Value: TEST NOT REPORTED DUE TO COLOR INTERFERENCE OF URINE PIGMENT   Nitrite   (*)    Value: TEST NOT REPORTED DUE TO COLOR INTERFERENCE OF URINE PIGMENT   Leukocytes,Ua   (*)    Value: TEST NOT REPORTED DUE TO COLOR INTERFERENCE OF URINE PIGMENT   All other components within normal limits  URINALYSIS, MICROSCOPIC (REFLEX) - Abnormal; Notable for the following components:   Bacteria, UA RARE (*)    All other components within normal limits  URINE CULTURE  CBG MONITORING, ED    EKG None  Radiology No results found.  Procedures Procedures (including critical care time)  Medications Ordered in ED Medications  cephALEXin (KEFLEX) capsule 500 mg (has no administration in time range)    ED Course  I have reviewed the triage vital signs and the nursing notes.  Pertinent labs & imaging results that were available during my care  of the patient were reviewed by me and considered in my medical decision making (see chart for details).    MDM Rules/Calculators/A&P                          *** Final Clinical Impression(s) / ED Diagnoses Final diagnoses:  Hematuria, unspecified type  Idiopathic gout of left foot, unspecified chronicity  Normocytic anemia  Renal insufficiency  Elevated blood pressure reading with diagnosis of hypertension    Rx / DC Orders ED Discharge Orders         Ordered    colchicine 0.6 MG tablet  2 times daily PRN        04/16/20 0129    cephALEXin (KEFLEX) 500 MG capsule  3 times daily        04/16/20 0129

## 2020-04-16 NOTE — ED Notes (Signed)
E-signature pad unavailable at time of pt discharge. This RN discussed discharge materials with pt and answered all pt questions. Pt stated understanding of discharge material. ? ?

## 2020-04-16 NOTE — ED Notes (Signed)
Facesheet, discharge papers, and medical necessity papers given to [facility]

## 2020-04-16 NOTE — ED Notes (Signed)
PTAR called  

## 2020-04-16 NOTE — ED Provider Notes (Signed)
[facility] EMERGENCY DEPARTMENT Provider Note   CSN: [id number] Arrival date & time: 04/15/20  1142   History Chief Complaint  Patient presents with  . dark colored urine    Bradley Johns is a 86 y.o. male.  The history is provided by the patient.  He has history of hypertension, diabetes, hyperlipidemia, pulmonary fibrosis, COPD on home oxygen, gout and comes in because of blood in his urine throughout the day today.  He was just discharged from the hospital 3 days ago after being admitted for symptomatic anemia.  He denies urinary urgency, frequency, tenesmus, dysuria.  He denies abdominal pain or flank pain.  He denies nausea, vomiting.  Denies fever or chills.  He has noted some small clots present.  He has not had problems with hematuria in the past.  He is not on any anticoagulants or antiplatelet agents.  He is also complaining of pain in his left foot from gout.  He had recently had problems with pain in his right foot from gout and was put on a course of prednisone.  He does have prescription for colchicine, but had run out.  Past Medical History:  Diagnosis Date  . Arthritis   . Benign prostatic hypertrophy   . COPD (chronic obstructive pulmonary disease) ([place])    24/7 O2  . Diabetes mellitus    per Dr Posey Pronto  . Epidermal cyst of face    left lower lateral cheek  . Gout   . Hyperlipidemia   . Hypertension   . Pulmonary fibrosis ([place]) 2014  . Sleep apnea     Patient Active Problem List   Diagnosis Date Noted  . Hypoxia   . Acute respiratory distress   . Cardiac arrest ([place])   . Symptomatic anemia 03/31/2020  . Syncope and collapse 03/31/2020  . AKI (acute kidney injury) ([place]) 03/31/2020  . Chronic respiratory failure with hypoxia ([place]) 06/03/2018  . Anemia associated with acute blood loss 05/20/2018  . Acute GI bleeding 05/17/2018  . PCP NOTES >>>>>>>>>>>>>>>. 12/30/2014  . Pulmonary fibrosis ([place]) 12/14/2012  . Cubital tunnel syndrome on  left 11/14/2012  . Dyspnea on exertion 10/11/2012  . Trigger finger of both hands 09/26/2011  . Annual physical exam 03/18/2011  . BPH (benign prostatic hyperplasia) 07/11/2008  . Osteoarthritis 04/18/2008  . Hyperlipidemia 10/06/2006  . Diabetes mellitus type 2 in nonobese ([place]) 04/29/2006  . GOUT 04/29/2006  . Essential hypertension 04/29/2006  . COPD with emphysema, on O2 04/29/2006  . Sleep apnea 04/29/2006    Past Surgical History:  Procedure Laterality Date  . COLONOSCOPY WITH PROPOFOL N/A 05/18/2018   Procedure: COLONOSCOPY WITH PROPOFOL;  Surgeon: Carol Ada, MD;  [facility];  Service: Endoscopy;  Laterality: N/A;  . POLYPECTOMY  05/18/2018   Procedure: POLYPECTOMY;  Surgeon: Carol Ada, MD;  [facility];  Service: Endoscopy;;  . TOE SURGERY         Family History  Problem Relation Age of Onset  . Liver disease Mother   . Coronary artery disease Father 41  . Diabetes Sister   . Cancer Brother        type?  Marland Kitchen Hypertension Neg Hx   . Hyperlipidemia Neg Hx   . Heart attack Neg Hx   . Prostate cancer Neg Hx   . Colon cancer Neg Hx     Social History   Tobacco Use  . Smoking status: Former Smoker    Packs/day: 1.50    Years: 40.00  Pack years: 60.00    Types: Cigarettes    Quit date: 04/07/1996    Years since quitting: 24.0  . Smokeless tobacco: Never Used  Substance Use Topics  . Alcohol use: Yes    Comment: occassional beer  . Drug use: No    Home Medications Prior to Admission medications   Medication Sig Start Date End Date Taking? Authorizing Provider  cephALEXin (KEFLEX) 500 MG capsule Take 1 capsule (500 mg total) by mouth 3 (three) times daily. 8/41/66  Yes Delora Fuel, MD  ACCU-CHEK AVIVA PLUS test strip Reported on 06/20/2015 12/13/14   [provider]  ACCU-CHEK SOFTCLIX LANCETS lancets Reported on 06/20/2015 12/13/14   [provider]  aspirin 81 MG tablet Take 1 tablet (81 mg total) by mouth daily.  Start taking 05/27/18 05/27/18   Pokhrel, Corrie Mckusick, MD  atorvastatin (LIPITOR) 80 MG tablet Take 1 tablet (80 mg total) by mouth daily. Patient taking differently: Take 80 mg by mouth every evening. 04/15/17   Colon Branch, MD  B-D UF III MINI PEN NEEDLES 31G X 5 MM MISC Reported on 06/20/2015 11/07/14   [provider]  Blood Glucose Monitoring Suppl (ACCU-CHEK AVIVA PLUS) W/DEVICE KIT Reported on 06/20/2015 10/27/14   [provider]  carvedilol (COREG) 3.125 MG tablet Take 1 tablet (3.125 mg total) by mouth 2 (two) times daily with a meal. 04/13/20   [place] Donnamarie Poag, DO  colchicine 0.6 MG tablet Take 1 tablet (0.6 mg total) by mouth 2 (two) times daily as needed. 0/63/01   Delora Fuel, MD  doxazosin (CARDURA) 4 MG tablet Take 1 tablet (4 mg total) by mouth at bedtime. 03/01/19   Colon Branch, MD  ferrous sulfate 325 (65 FE) MG tablet Take 1 tablet (325 mg total) by mouth daily with breakfast. 04/14/20   [place] Donnamarie Poag, DO  ketoconazole (NIZORAL) 2 % cream Apply 1 application topically daily. Patient taking differently: Apply 1 application topically daily as needed (moisture around groin). 03/24/18   Colon Branch, MD  metFORMIN (GLUCOPHAGE) 500 MG tablet Take 500 mg by mouth 2 (two) times daily with a meal.    [provider]  Multiple Vitamin (MULTIVITAMIN) tablet Take 1 tablet by mouth daily.    [provider]  OXYGEN Inhale into the lungs. 3L Nocturnal and PRN for Hypoxemia    [provider]  pioglitazone (ACTOS) 45 MG tablet Take 45 mg by mouth daily.    [provider]  predniSONE (DELTASONE) 20 MG tablet Take 2 tablets (40 mg total) by mouth daily with breakfast for 5 days. 04/14/20 04/19/20  [place] Donnamarie Poag, DO  saline (AYR) GEL Place 1 application into both nostrils every 12 (twelve) hours. 04/13/20   [place] Donnamarie Poag, DO  sodium chloride (OCEAN) 0.65 % SOLN nasal spray Place 1 spray into both nostrils as needed for congestion. 04/13/20   [place] Eric J, DO     Allergies    Patient has no known allergies.  Review of Systems   Review of Systems  All other systems reviewed and are negative.   Physical Exam Updated Vital Signs BP (!) 172/82 (BP Location: Left Arm)   Pulse 96   Temp 97.9 F (36.6 C) (Oral)   Resp 16   Ht $R'5\' 8"'EA$  (1.727 m)   Wt 81.2 kg   SpO2 91%   BMI 27.22 kg/m   Physical Exam Vitals and nursing note reviewed.   86 year old 19male, resting comfortably and in  no acute distress. Vital signs are significant for elevated blood pressure. Oxygen saturation is 91 %, which is borderline. Head is normocephalic and atraumatic. PERRLA, EOMI. Oropharynx is clear. Neck is nontender and supple without adenopathy or JVD. Back is nontender and there is no CVA tenderness. Lungs are clear without rales, wheezes, or rhonchi. Chest is nontender. Heart has regular rate and rhythm without murmur. Abdomen is soft, flat, nontender without masses or hepatosplenomegaly and peristalsis is normoactive. Extremities have no cyanosis or edema, full range of motion is present.  There is slight warmth on the dorsum of the left midfoot but no erythema or swelling. Skin is warm and dry without rash. Neurologic: Mental status is normal, cranial nerves are intact, there are no motor or sensory deficits.  ED Results / Procedures / Treatments   Labs (all labs ordered are listed, but only abnormal results are displayed) Labs Reviewed  URINALYSIS, ROUTINE W REFLEX MICROSCOPIC - Abnormal; Notable for the following components:      Result Value   Color, Urine RED (*)    APPearance TURBID (*)    Glucose, UA   (*)    Value: TEST NOT REPORTED DUE TO COLOR INTERFERENCE OF URINE PIGMENT   Hgb urine dipstick   (*)    Value: TEST NOT REPORTED DUE TO COLOR INTERFERENCE OF URINE PIGMENT   Bilirubin Urine   (*)    Value: TEST NOT REPORTED DUE TO COLOR INTERFERENCE OF URINE PIGMENT   Ketones, ur   (*)    Value: TEST NOT REPORTED DUE TO COLOR INTERFERENCE OF  URINE PIGMENT   Protein, ur   (*)    Value: TEST NOT REPORTED DUE TO COLOR INTERFERENCE OF URINE PIGMENT   Nitrite   (*)    Value: TEST NOT REPORTED DUE TO COLOR INTERFERENCE OF URINE PIGMENT   Leukocytes,Ua   (*)    Value: TEST NOT REPORTED DUE TO COLOR INTERFERENCE OF URINE PIGMENT   All other components within normal limits  URINALYSIS, MICROSCOPIC (REFLEX) - Abnormal; Notable for the following components:   Bacteria, UA RARE (*)    All other components within normal limits  URINE CULTURE  CBG MONITORING, ED   Procedures Procedures   Medications Ordered in ED Medications  cephALEXin (KEFLEX) capsule 500 mg (500 mg Oral Given 04/16/20 0136)    ED Course  I have reviewed the triage vital signs and the nursing notes.  Pertinent lab results that were available during my care of the patient were reviewed by me and considered in my medical decision making (see chart for details).  MDM Rules/Calculators/A&P Hematuria, cause unclear.  Urinalysis is obtained showing greater than 50 RBCs, 11-20 WBCs, rare bacteria.  Hemoglobin is essentially unchanged from recent hospitalization as his renal function.  Old records were reviewed confirming recent hospitalization for symptomatic anemia for which she did receive blood transfusion.  He was discharged on a prednisone taper for gout flareup.  Although there is not clear evidence of infection, urine is sent for culture and he is discharged with a prescription for cephalexin, also given prescription for colchicine to take as needed for his gout.  He is referred to urology for outpatient work-up of hematuria, referred back to his PCP for management of multiple medical conditions including gout, hypertension, renal insufficiency.  Return precautions discussed.  Final Clinical Impression(s) / ED Diagnoses Final diagnoses:  Hematuria, unspecified type  Idiopathic gout of left foot, unspecified chronicity  Normocytic anemia  Renal insufficiency   Elevated blood  pressure reading with diagnosis of hypertension    Rx / DC Orders ED Discharge Orders         Ordered    colchicine 0.6 MG tablet  2 times daily PRN        04/16/20 0129    cephALEXin (KEFLEX) 500 MG capsule  3 times daily        04/16/20 2536           Delora Fuel, MD 64/40/34 0157

## 2020-04-16 NOTE — Discharge Instructions (Signed)
Return if you are having any problems. 

## 2020-04-17 LAB — URINE CULTURE: Culture: <10000 — AB

## 2020-04-18 DIAGNOSIS — E119 Type 2 diabetes mellitus without complications: Secondary | ICD-10-CM | POA: Diagnosis not present

## 2020-04-18 DIAGNOSIS — I509 Heart failure, unspecified: Secondary | ICD-10-CM | POA: Diagnosis not present

## 2020-04-18 DIAGNOSIS — R5381 Other malaise: Secondary | ICD-10-CM | POA: Diagnosis not present

## 2020-04-18 DIAGNOSIS — R319 Hematuria, unspecified: Secondary | ICD-10-CM | POA: Diagnosis not present

## 2020-04-18 DIAGNOSIS — J449 Chronic obstructive pulmonary disease, unspecified: Secondary | ICD-10-CM | POA: Diagnosis not present

## 2020-04-18 DIAGNOSIS — D508 Other iron deficiency anemias: Secondary | ICD-10-CM | POA: Diagnosis not present

## 2020-04-18 DIAGNOSIS — N179 Acute kidney failure, unspecified: Secondary | ICD-10-CM | POA: Diagnosis not present

## 2020-04-19 DIAGNOSIS — E785 Hyperlipidemia, unspecified: Secondary | ICD-10-CM | POA: Diagnosis not present

## 2020-04-20 DIAGNOSIS — J449 Chronic obstructive pulmonary disease, unspecified: Secondary | ICD-10-CM | POA: Diagnosis not present

## 2020-04-20 DIAGNOSIS — M109 Gout, unspecified: Secondary | ICD-10-CM | POA: Diagnosis not present

## 2020-04-27 DIAGNOSIS — I509 Heart failure, unspecified: Secondary | ICD-10-CM | POA: Diagnosis not present

## 2020-04-27 DIAGNOSIS — R319 Hematuria, unspecified: Secondary | ICD-10-CM | POA: Diagnosis not present

## 2020-04-27 DIAGNOSIS — J449 Chronic obstructive pulmonary disease, unspecified: Secondary | ICD-10-CM | POA: Diagnosis not present

## 2020-04-27 DIAGNOSIS — E119 Type 2 diabetes mellitus without complications: Secondary | ICD-10-CM | POA: Diagnosis not present

## 2020-04-27 DIAGNOSIS — R5381 Other malaise: Secondary | ICD-10-CM | POA: Diagnosis not present

## 2020-04-27 DIAGNOSIS — I10 Essential (primary) hypertension: Secondary | ICD-10-CM | POA: Diagnosis not present

## 2020-04-27 DIAGNOSIS — R0982 Postnasal drip: Secondary | ICD-10-CM | POA: Diagnosis not present

## 2020-04-27 DIAGNOSIS — D508 Other iron deficiency anemias: Secondary | ICD-10-CM | POA: Diagnosis not present

## 2020-04-27 DIAGNOSIS — N179 Acute kidney failure, unspecified: Secondary | ICD-10-CM | POA: Diagnosis not present

## 2020-04-27 DIAGNOSIS — R0602 Shortness of breath: Secondary | ICD-10-CM | POA: Diagnosis not present

## 2020-04-28 DIAGNOSIS — R0902 Hypoxemia: Secondary | ICD-10-CM | POA: Diagnosis not present

## 2020-04-30 ENCOUNTER — Telehealth: Payer: Self-pay | Admitting: Pulmonary Disease

## 2020-04-30 DIAGNOSIS — D649 Anemia, unspecified: Secondary | ICD-10-CM | POA: Diagnosis not present

## 2020-04-30 DIAGNOSIS — J189 Pneumonia, unspecified organism: Secondary | ICD-10-CM | POA: Diagnosis not present

## 2020-04-30 DIAGNOSIS — I509 Heart failure, unspecified: Secondary | ICD-10-CM | POA: Diagnosis not present

## 2020-04-30 DIAGNOSIS — R0602 Shortness of breath: Secondary | ICD-10-CM | POA: Diagnosis not present

## 2020-04-30 DIAGNOSIS — I10 Essential (primary) hypertension: Secondary | ICD-10-CM | POA: Diagnosis not present

## 2020-04-30 NOTE — Telephone Encounter (Signed)
Left message for patient's wife to call back tomorrow.

## 2020-05-02 DIAGNOSIS — J189 Pneumonia, unspecified organism: Secondary | ICD-10-CM | POA: Diagnosis not present

## 2020-05-02 DIAGNOSIS — E119 Type 2 diabetes mellitus without complications: Secondary | ICD-10-CM | POA: Diagnosis not present

## 2020-05-02 DIAGNOSIS — D508 Other iron deficiency anemias: Secondary | ICD-10-CM | POA: Diagnosis not present

## 2020-05-02 DIAGNOSIS — I10 Essential (primary) hypertension: Secondary | ICD-10-CM | POA: Diagnosis not present

## 2020-05-02 DIAGNOSIS — J449 Chronic obstructive pulmonary disease, unspecified: Secondary | ICD-10-CM | POA: Diagnosis not present

## 2020-05-03 ENCOUNTER — Telehealth: Payer: Self-pay | Admitting: Internal Medicine

## 2020-05-03 DIAGNOSIS — J189 Pneumonia, unspecified organism: Secondary | ICD-10-CM | POA: Diagnosis not present

## 2020-05-03 DIAGNOSIS — I10 Essential (primary) hypertension: Secondary | ICD-10-CM | POA: Diagnosis not present

## 2020-05-03 DIAGNOSIS — E119 Type 2 diabetes mellitus without complications: Secondary | ICD-10-CM | POA: Diagnosis not present

## 2020-05-03 DIAGNOSIS — I509 Heart failure, unspecified: Secondary | ICD-10-CM | POA: Diagnosis not present

## 2020-05-03 DIAGNOSIS — R5381 Other malaise: Secondary | ICD-10-CM | POA: Diagnosis not present

## 2020-05-03 NOTE — Telephone Encounter (Signed)
Caller: Lavella Lemons ([place]) Call back # [phone]  Patient would be discharge 05/04/[facility] from [facility] facility. They have order some home health, nursing, PT, OT and nurse Aid  Tanya from [place] would like to know if doctor would write orders

## 2020-05-03 NOTE — Telephone Encounter (Signed)
Called and spoke to pt's wife. She states she no longer needs help with pts O2, all is well. Nothing further needed.

## 2020-05-03 NOTE — Telephone Encounter (Signed)
Spoke w/ Lavella Lemons- informed that last OV for Pt was 10/2018. She will have Pt's wife call to schedule a hospital f/u for next week.

## 2020-05-04 ENCOUNTER — Encounter: Payer: Self-pay | Admitting: Internal Medicine

## 2020-05-04 DIAGNOSIS — J449 Chronic obstructive pulmonary disease, unspecified: Secondary | ICD-10-CM | POA: Diagnosis not present

## 2020-05-04 DIAGNOSIS — I5033 Acute on chronic diastolic (congestive) heart failure: Secondary | ICD-10-CM | POA: Diagnosis not present

## 2020-05-07 ENCOUNTER — Other Ambulatory Visit: Payer: Self-pay

## 2020-05-07 ENCOUNTER — Encounter: Payer: Self-pay | Admitting: Internal Medicine

## 2020-05-07 ENCOUNTER — Ambulatory Visit (INDEPENDENT_AMBULATORY_CARE_PROVIDER_SITE_OTHER): Payer: Medicare HMO | Admitting: Internal Medicine

## 2020-05-07 VITALS — BP 125/61 | HR 76 | Temp 97.8°F | Ht 68.0 in | Wt 179.0 lb

## 2020-05-07 DIAGNOSIS — R911 Solitary pulmonary nodule: Secondary | ICD-10-CM | POA: Diagnosis not present

## 2020-05-07 DIAGNOSIS — R7989 Other specified abnormal findings of blood chemistry: Secondary | ICD-10-CM

## 2020-05-07 DIAGNOSIS — Z23 Encounter for immunization: Secondary | ICD-10-CM | POA: Diagnosis not present

## 2020-05-07 DIAGNOSIS — D62 Acute posthemorrhagic anemia: Secondary | ICD-10-CM

## 2020-05-07 DIAGNOSIS — N179 Acute kidney failure, unspecified: Secondary | ICD-10-CM | POA: Diagnosis not present

## 2020-05-07 DIAGNOSIS — R310 Gross hematuria: Secondary | ICD-10-CM

## 2020-05-07 LAB — CBC WITH DIFFERENTIAL/PLATELET
Basophils Absolute: 0 10*3/uL (ref 0.0–0.1)
Basophils Relative: 0.4 % (ref 0.0–3.0)
Eosinophils Absolute: 0.1 10*3/uL (ref 0.0–0.7)
Eosinophils Relative: 0.9 % (ref 0.0–5.0)
HCT: 32.8 % — ABNORMAL LOW (ref 39.0–52.0)
Hemoglobin: 10.4 g/dL — ABNORMAL LOW (ref 13.0–17.0)
Lymphocytes Relative: 15.4 % (ref 12.0–46.0)
Lymphs Abs: 0.9 10*3/uL (ref 0.7–4.0)
MCHC: 31.8 g/dL (ref 30.0–36.0)
MCV: 93 fl (ref 78.0–100.0)
Monocytes Absolute: 0.6 10*3/uL (ref 0.1–1.0)
Monocytes Relative: 9.8 % (ref 3.0–12.0)
Neutro Abs: 4.5 10*3/uL (ref 1.4–7.7)
Neutrophils Relative %: 73.5 % (ref 43.0–77.0)
Platelets: 241 10*3/uL (ref 150.0–400.0)
RBC: 3.53 Mil/uL — ABNORMAL LOW (ref 4.22–5.81)
RDW: 26.8 % — ABNORMAL HIGH (ref 11.5–15.5)
WBC: 6.1 10*3/uL (ref 4.0–10.5)

## 2020-05-07 LAB — COMPREHENSIVE METABOLIC PANEL
ALT: 18 U/L (ref 0–53)
AST: 25 U/L (ref 0–37)
Albumin: 3.7 g/dL (ref 3.5–5.2)
Alkaline Phosphatase: 82 U/L (ref 39–117)
BUN: 24 mg/dL — ABNORMAL HIGH (ref 6–23)
CO2: 29 mEq/L (ref 19–32)
Calcium: 9.1 mg/dL (ref 8.4–10.5)
Chloride: 104 mEq/L (ref 96–112)
Creatinine, Ser: 1.58 mg/dL — ABNORMAL HIGH (ref 0.40–1.50)
GFR: 40.18 mL/min — ABNORMAL LOW (ref >60.00)
Glucose, Bld: 77 mg/dL (ref 70–99)
Potassium: 4.3 mEq/L (ref 3.5–5.1)
Sodium: 142 mEq/L (ref 135–145)
Total Bilirubin: 0.6 mg/dL (ref 0.2–1.2)
Total Protein: 6.9 g/dL (ref 6.0–8.3)

## 2020-05-07 LAB — TSH: TSH: 8.14 u[IU]/mL — ABNORMAL HIGH (ref 0.35–4.50)

## 2020-05-07 LAB — IRON: Iron: 51 ug/dL (ref 42–165)

## 2020-05-07 LAB — FERRITIN: Ferritin: 100.9 ng/mL (ref 22.0–322.0)

## 2020-05-07 NOTE — Assessment & Plan Note (Signed)
Here with his daughter Bradley Johns Syncope, severe anemia, PEA cardiac arrest, acute on chronic respiratory failure-COPD, AKI, HTN: Patient was admitted to the hospital with above problems.  Subsequently discharged to SNF -Syncope: No further episodes -Severe anemia: Admitted with a hemoglobin of 5, 2 PRBCs, IV iron, no GI symptoms, last colonoscopy was 05-2018.  Currently on iron by mouth.  Was not evaluated by GI in-house, checking labs, refer to GI. -Hematuria: On and off since before the hospital admission few weeks ago, no evidence of infection, has an appointment pending to see urology in few weeks, will call and ask for sooner appointment.  Consider check CT renal if not seen by urology soon. -DM: Last A1c below 6, ambulatory CBGs rarely less than 100, continue Metformin, Actos. -HTN: Currently on carvedilol, Cardura.  BP today is great, no change - ASA: No history of CAD or stroke, having hematuria on and off, rec to d/c   -AKI: Checking labs -Heart failure?  Question of volume overload at the hospital, echo showed LVH, diastolic heart failure? Pulmonary nodule: 10 mm, CT follow-up due 3 months -Increase TSH: Recheck -Flu shot today -Reassess in 3 weeks.  At that time consider schedule a visit with pulmonary, cardiology

## 2020-05-07 NOTE — Telephone Encounter (Signed)
[place] Call Back @ [phone]  Called requesting verbal orders for PT,OT, Nursing, and Aide .   Please Advise

## 2020-05-07 NOTE — Patient Instructions (Addendum)
Check the  blood pressure   BP GOAL is between 110/65 and  135/85. If it is consistently higher or lower, let me know  Call if your blood sugars are consistently less than 100  Okay to stop aspirin  I will try to schedule a CAT scan of your kidneys  GO TO THE LAB : Get the blood work     [place], [place] back for a checkup in 3 weeks

## 2020-05-07 NOTE — Patient Outreach (Signed)
[facility]) Care Management  05/07/2020  Bradley Johns 04-02-1935 [id number]   Referral Date: 05/07/20 Referral Source:[employer] Report Referral Reason:[employer] Report-discharge from [facility] and recovery on 05-04-20   Outreach Attempt: spoke with patient. He acknowledges being discharge on 05-04-20.  Discussed with patient reason for call and [facility] services and support.  Patient declined right now but agree to accept a letter for future reference if he changes his mind.     Plan: RN CM will send letter and close case.    Jone Baseman, RN, MSN [facility] Care Management Care Management Coordinator Direct Line [phone] Toll Free: [phone]  Fax: [phone]

## 2020-05-07 NOTE — Progress Notes (Signed)
Subjective:    Patient ID: Bradley Johns, male    DOB: [birth year], 86 y.o.   MRN: [id number]  DOS:  05/07/2020 Type of visit - description: Hospital follow-up, last visit 10-2018  Admitted 03/31/2020, discharged 04/13/2020: Presented to the ER after syncope, hemoglobin was 5.0, Hemoccult negative, he did report hematuria, received 2 PRBCs, developed dyspnea, become hypoxic, VQ scan low risk for PE. There was concern about pulmonary edema, IV Lasix and Solu-Medrol provided. On 04/03/2020 went into PEA arrest, had 1 round of CPR prior to ROSC. Was transferred to ICU.  Eventually IV Lasix discontinue, creatinine noted to be elevated. TTE showed LVH, EF 60 to 65%. HTN: Previously on losartan amlodipine, switch to carvedilol Anemia: Hemoglobin 5 on admission, ferritin 11, had 2 PRBCs.  Also IV iron. Discharge to SNF  ER visit 04/15/2020: Was seen with hematuria, felt to be stable, discharged home.  Review of Systems   The patient was discharged from the SNF and is currently at home for the last few days. He is here with his daughter Bradley Johns.  No fever chills Appetite somewhat decreased + DOE but no chest pain or shortness of breath at rest. Lower extremity edema at baseline No nausea, vomiting, diarrhea.  No blood in the stools. No cough,occ sputum production He continue with intermittent hematuria,  occasionally he is urine is turbid. Some urinary difficulty.   Past Medical History:  Diagnosis Date  . Arthritis   . Benign prostatic hypertrophy   . COPD (chronic obstructive pulmonary disease) ([place])    24/7 O2  . Diabetes mellitus    per Dr Posey Pronto  . Epidermal cyst of face    left lower lateral cheek  . Gout   . Hyperlipidemia   . Hypertension   . Pulmonary fibrosis ([place]) 2014  . Sleep apnea     Past Surgical History:  Procedure Laterality Date  . COLONOSCOPY WITH PROPOFOL N/A 05/18/2018   Procedure: COLONOSCOPY WITH PROPOFOL;  Surgeon: Carol Ada, MD;  Location: [facility];  Service: Endoscopy;  Laterality: N/A;  . POLYPECTOMY  05/18/2018   Procedure: POLYPECTOMY;  Surgeon: Carol Ada, MD;  Location: [facility];  Service: Endoscopy;;  . TOE SURGERY      Allergies as of 05/07/2020   No Known Allergies     Medication List       Accurate as of May 07, 2020 11:18 AM. If you have any questions, ask your nurse or doctor.        Accu-Chek Aviva Plus test strip Generic drug: glucose blood Reported on 06/20/2015   Accu-Chek Aviva Plus w/Device Kit Reported on 06/20/2015   Accu-Chek Softclix Lancets lancets Reported on 06/20/2015   aspirin 81 MG tablet Take 1 tablet (81 mg total) by mouth daily. Start taking 05/27/18   atorvastatin 80 MG tablet Commonly known as: LIPITOR Take 1 tablet (80 mg total) by mouth daily. What changed: when to take this   B-D UF III MINI PEN NEEDLES 31G X 5 MM Misc Generic drug: Insulin Pen Needle Reported on 06/20/2015   carvedilol 3.125 MG tablet Commonly known as: COREG Take 1 tablet (3.125 mg total) by mouth 2 (two) times daily with a meal.   cephALEXin 500 MG capsule Commonly known as: KEFLEX Take 1 capsule (500 mg total) by mouth 3 (three) times daily.   colchicine 0.6 MG tablet Take 1 tablet (0.6 mg total) by mouth 2 (two) times daily as needed.   doxazosin 4 MG tablet Commonly known as:  CARDURA Take 1 tablet (4 mg total) by mouth at bedtime.   ferrous sulfate 325 (65 FE) MG tablet Take 1 tablet (325 mg total) by mouth daily with breakfast.   ketoconazole 2 % cream Commonly known as: NIZORAL Apply 1 application topically daily. What changed:   when to take this  reasons to take this   metFORMIN 500 MG tablet Commonly known as: GLUCOPHAGE Take 500 mg by mouth 2 (two) times daily with a meal.   multivitamin tablet Take 1 tablet by mouth daily.   OXYGEN Inhale into the lungs. 3L Nocturnal and PRN for Hypoxemia   pioglitazone 45 MG tablet Commonly known as: ACTOS Take 45 mg by  mouth daily.   saline Gel Place 1 application into both nostrils every 12 (twelve) hours.   sodium chloride 0.65 % Soln nasal spray Commonly known as: OCEAN Place 1 spray into both nostrils as needed for congestion.          Objective:   Physical Exam BP 125/61 (BP Location: Left Arm, Patient Position: Sitting, Cuff Size: Large)   Pulse 76   Temp 97.8 F (36.6 C) (Oral)   Ht $R'5\' 8"'ES$  (1.727 m)   Wt 179 lb (81.2 kg)   SpO2 96%   BMI 27.22 kg/m  General:   Well developed, NAD, BMI noted.  HEENT:  Normocephalic . Face symmetric, atraumatic Lungs:  Decreased breath sounds Normal respiratory effort, no intercostal retractions, no accessory muscle use. Heart: RRR,  no murmur.  Abdomen:  Not distended, soft, non-tender. No rebound or rigidity.   Skin: Not pale. Not jaundice Lower extremities: no pretibial edema bilaterally  Neurologic:  alert & oriented X3.  Speech normal, gait appropriate for age and unassisted Psych--  Cognition and judgment appear intact.  Cooperative with normal attention span and concentration.  Behavior appropriate. No anxious or depressed appearing.     Assessment     Assessment  DM Dr. Posey Pronto  HTN Hyperlipidemia DJD-- rarely uses hydrocodone Gout -- occ colchicine PULM: --COPD on oxygen  24/7   --OSA - no compliant w/ cpap --Pulmonary fibrosis BPH (Nocturia), has not seen urology, last PSA DRE  2016 wnl, myrbetriq -no help    PLAN Here with his daughter Bradley Johns Syncope, severe anemia, PEA cardiac arrest, acute on chronic respiratory failure-COPD, AKI, HTN: Patient was admitted to the hospital with above problems.  Subsequently discharged to SNF -Syncope: No further episodes -Severe anemia: Admitted with a hemoglobin of 5, 2 PRBCs, IV iron, no GI symptoms, last colonoscopy was 05-2018.  Currently on iron by mouth.  Was not evaluated by GI in-house, checking labs, refer to GI. -Hematuria: On and off since before the hospital admission few  weeks ago, no evidence of infection, has an appointment pending to see urology in few weeks, will call and ask for sooner appointment.  Consider check CT renal if not seen by urology soon. -DM: Last A1c below 6, ambulatory CBGs rarely less than 100, continue Metformin, Actos. -HTN: Currently on carvedilol, Cardura.  BP today is great, no change - ASA: No history of CAD or stroke, having hematuria on and off, rec to d/c   -AKI: Checking labs -Heart failure?  Question of volume overload at the hospital, echo showed LVH, diastolic heart failure? Pulmonary nodule: 10 mm, CT follow-up due 3 months -Increase TSH: Recheck -Flu shot today -Reassess in 3 weeks.  At that time consider schedule a visit with pulmonary, cardiology    Time spent with the patient and his  daughter 45 minutes  This visit occurred during the SARS-CoV-2 public health emergency.  Safety protocols were in place, including screening questions prior to the visit, additional usage of staff PPE, and extensive cleaning of exam room while observing appropriate contact time as indicated for disinfecting solutions.

## 2020-05-07 NOTE — Telephone Encounter (Signed)
Spoke w/ Miranda- verbal orders given.

## 2020-05-08 NOTE — Addendum Note (Signed)
Addended byDamita Dunnings D on: 05/08/2020 07:49 AM   Modules accepted: Orders

## 2020-05-09 DIAGNOSIS — N400 Enlarged prostate without lower urinary tract symptoms: Secondary | ICD-10-CM | POA: Diagnosis not present

## 2020-05-09 DIAGNOSIS — R310 Gross hematuria: Secondary | ICD-10-CM | POA: Diagnosis not present

## 2020-05-11 DIAGNOSIS — D500 Iron deficiency anemia secondary to blood loss (chronic): Secondary | ICD-10-CM | POA: Diagnosis not present

## 2020-05-11 DIAGNOSIS — I5033 Acute on chronic diastolic (congestive) heart failure: Secondary | ICD-10-CM | POA: Diagnosis not present

## 2020-05-11 DIAGNOSIS — J9611 Chronic respiratory failure with hypoxia: Secondary | ICD-10-CM | POA: Diagnosis not present

## 2020-05-11 DIAGNOSIS — N1832 Chronic kidney disease, stage 3b: Secondary | ICD-10-CM | POA: Diagnosis not present

## 2020-05-11 DIAGNOSIS — I130 Hypertensive heart and chronic kidney disease with heart failure and stage 1 through stage 4 chronic kidney disease, or unspecified chronic kidney disease: Secondary | ICD-10-CM | POA: Diagnosis not present

## 2020-05-11 DIAGNOSIS — N400 Enlarged prostate without lower urinary tract symptoms: Secondary | ICD-10-CM | POA: Diagnosis not present

## 2020-05-11 DIAGNOSIS — J8410 Pulmonary fibrosis, unspecified: Secondary | ICD-10-CM | POA: Diagnosis not present

## 2020-05-11 DIAGNOSIS — J449 Chronic obstructive pulmonary disease, unspecified: Secondary | ICD-10-CM | POA: Diagnosis not present

## 2020-05-11 DIAGNOSIS — E1122 Type 2 diabetes mellitus with diabetic chronic kidney disease: Secondary | ICD-10-CM | POA: Diagnosis not present

## 2020-05-15 ENCOUNTER — Telehealth: Payer: Self-pay | Admitting: Internal Medicine

## 2020-05-15 DIAGNOSIS — N1832 Chronic kidney disease, stage 3b: Secondary | ICD-10-CM | POA: Diagnosis not present

## 2020-05-15 DIAGNOSIS — I130 Hypertensive heart and chronic kidney disease with heart failure and stage 1 through stage 4 chronic kidney disease, or unspecified chronic kidney disease: Secondary | ICD-10-CM | POA: Diagnosis not present

## 2020-05-15 DIAGNOSIS — I5033 Acute on chronic diastolic (congestive) heart failure: Secondary | ICD-10-CM | POA: Diagnosis not present

## 2020-05-15 DIAGNOSIS — D62 Acute posthemorrhagic anemia: Secondary | ICD-10-CM

## 2020-05-15 DIAGNOSIS — D500 Iron deficiency anemia secondary to blood loss (chronic): Secondary | ICD-10-CM | POA: Diagnosis not present

## 2020-05-15 DIAGNOSIS — J9611 Chronic respiratory failure with hypoxia: Secondary | ICD-10-CM | POA: Diagnosis not present

## 2020-05-15 DIAGNOSIS — N400 Enlarged prostate without lower urinary tract symptoms: Secondary | ICD-10-CM | POA: Diagnosis not present

## 2020-05-15 DIAGNOSIS — J8410 Pulmonary fibrosis, unspecified: Secondary | ICD-10-CM | POA: Diagnosis not present

## 2020-05-15 DIAGNOSIS — E1122 Type 2 diabetes mellitus with diabetic chronic kidney disease: Secondary | ICD-10-CM | POA: Diagnosis not present

## 2020-05-15 DIAGNOSIS — J449 Chronic obstructive pulmonary disease, unspecified: Secondary | ICD-10-CM | POA: Diagnosis not present

## 2020-05-15 NOTE — Telephone Encounter (Signed)
Referral changed

## 2020-05-15 NOTE — Telephone Encounter (Signed)
Patient would like referral to be change to [facility]

## 2020-05-16 DIAGNOSIS — J8410 Pulmonary fibrosis, unspecified: Secondary | ICD-10-CM | POA: Diagnosis not present

## 2020-05-16 DIAGNOSIS — N400 Enlarged prostate without lower urinary tract symptoms: Secondary | ICD-10-CM | POA: Diagnosis not present

## 2020-05-16 DIAGNOSIS — D500 Iron deficiency anemia secondary to blood loss (chronic): Secondary | ICD-10-CM | POA: Diagnosis not present

## 2020-05-16 DIAGNOSIS — J449 Chronic obstructive pulmonary disease, unspecified: Secondary | ICD-10-CM | POA: Diagnosis not present

## 2020-05-16 DIAGNOSIS — I130 Hypertensive heart and chronic kidney disease with heart failure and stage 1 through stage 4 chronic kidney disease, or unspecified chronic kidney disease: Secondary | ICD-10-CM | POA: Diagnosis not present

## 2020-05-16 DIAGNOSIS — E1122 Type 2 diabetes mellitus with diabetic chronic kidney disease: Secondary | ICD-10-CM | POA: Diagnosis not present

## 2020-05-16 DIAGNOSIS — J9611 Chronic respiratory failure with hypoxia: Secondary | ICD-10-CM | POA: Diagnosis not present

## 2020-05-16 DIAGNOSIS — I5033 Acute on chronic diastolic (congestive) heart failure: Secondary | ICD-10-CM | POA: Diagnosis not present

## 2020-05-16 DIAGNOSIS — N1832 Chronic kidney disease, stage 3b: Secondary | ICD-10-CM | POA: Diagnosis not present

## 2020-05-17 ENCOUNTER — Inpatient Hospital Stay: Payer: Medicare HMO | Admitting: Internal Medicine

## 2020-05-22 ENCOUNTER — Telehealth: Payer: Self-pay | Admitting: Internal Medicine

## 2020-05-22 NOTE — Telephone Encounter (Signed)
Caller: Bradley [facility]  Call Back @ [phone]  Patient has a 5 liter oxygen concentrator upstairs in his house. Patient is requesting a larger concentrator to move around in his home and would like to know if  Dr Larose Kells can write a script for a new  one.

## 2020-05-22 NOTE — Telephone Encounter (Signed)
Noted. Thank you for your help.

## 2020-05-22 NOTE — Telephone Encounter (Signed)
Call made to patient, confirmed DOB. Made aware it has been 2 years since his last office visit and he will need a office visit in order to address this. Patient took the phone number and says he will call us back to set up the appt. I made him aware this is the 2nd time this has been requested but we cannot address this until he comes in and see's the doctor. Voiced understanding.   Nothing further needed at this time.

## 2020-05-22 NOTE — Telephone Encounter (Signed)
He has not been seen in our office since February 2020 He needs an office visit for evaluation I see that I had previously declined a request for POC for the same reason in 2021. Also it seems that he has a diagnosis of pulmonary fibrosis and used to be on O FEV which I do not see on his last now.  All these need to be reviewed on office visit

## 2020-05-22 NOTE — Telephone Encounter (Signed)
Will forward to Pulmonary.

## 2020-05-23 DIAGNOSIS — I130 Hypertensive heart and chronic kidney disease with heart failure and stage 1 through stage 4 chronic kidney disease, or unspecified chronic kidney disease: Secondary | ICD-10-CM | POA: Diagnosis not present

## 2020-05-23 DIAGNOSIS — N1832 Chronic kidney disease, stage 3b: Secondary | ICD-10-CM | POA: Diagnosis not present

## 2020-05-23 DIAGNOSIS — J8410 Pulmonary fibrosis, unspecified: Secondary | ICD-10-CM | POA: Diagnosis not present

## 2020-05-23 DIAGNOSIS — J9611 Chronic respiratory failure with hypoxia: Secondary | ICD-10-CM | POA: Diagnosis not present

## 2020-05-23 DIAGNOSIS — E1122 Type 2 diabetes mellitus with diabetic chronic kidney disease: Secondary | ICD-10-CM | POA: Diagnosis not present

## 2020-05-23 DIAGNOSIS — D500 Iron deficiency anemia secondary to blood loss (chronic): Secondary | ICD-10-CM | POA: Diagnosis not present

## 2020-05-23 DIAGNOSIS — N400 Enlarged prostate without lower urinary tract symptoms: Secondary | ICD-10-CM | POA: Diagnosis not present

## 2020-05-23 DIAGNOSIS — I5033 Acute on chronic diastolic (congestive) heart failure: Secondary | ICD-10-CM | POA: Diagnosis not present

## 2020-05-23 DIAGNOSIS — J449 Chronic obstructive pulmonary disease, unspecified: Secondary | ICD-10-CM | POA: Diagnosis not present

## 2020-05-24 DIAGNOSIS — C689 Malignant neoplasm of urinary organ, unspecified: Secondary | ICD-10-CM

## 2020-05-24 DIAGNOSIS — R310 Gross hematuria: Secondary | ICD-10-CM | POA: Diagnosis not present

## 2020-05-24 HISTORY — DX: Malignant neoplasm of urinary organ, unspecified: C68.9

## 2020-05-30 ENCOUNTER — Other Ambulatory Visit: Payer: Self-pay

## 2020-05-30 ENCOUNTER — Telehealth: Payer: Self-pay

## 2020-05-30 DIAGNOSIS — I130 Hypertensive heart and chronic kidney disease with heart failure and stage 1 through stage 4 chronic kidney disease, or unspecified chronic kidney disease: Secondary | ICD-10-CM | POA: Diagnosis not present

## 2020-05-30 DIAGNOSIS — G4730 Sleep apnea, unspecified: Secondary | ICD-10-CM

## 2020-05-30 DIAGNOSIS — D500 Iron deficiency anemia secondary to blood loss (chronic): Secondary | ICD-10-CM | POA: Diagnosis not present

## 2020-05-30 DIAGNOSIS — N1832 Chronic kidney disease, stage 3b: Secondary | ICD-10-CM | POA: Diagnosis not present

## 2020-05-30 DIAGNOSIS — N400 Enlarged prostate without lower urinary tract symptoms: Secondary | ICD-10-CM | POA: Diagnosis not present

## 2020-05-30 DIAGNOSIS — E1122 Type 2 diabetes mellitus with diabetic chronic kidney disease: Secondary | ICD-10-CM | POA: Diagnosis not present

## 2020-05-30 DIAGNOSIS — Z9981 Dependence on supplemental oxygen: Secondary | ICD-10-CM

## 2020-05-30 DIAGNOSIS — J449 Chronic obstructive pulmonary disease, unspecified: Secondary | ICD-10-CM | POA: Diagnosis not present

## 2020-05-30 DIAGNOSIS — Z8674 Personal history of sudden cardiac arrest: Secondary | ICD-10-CM

## 2020-05-30 DIAGNOSIS — J8410 Pulmonary fibrosis, unspecified: Secondary | ICD-10-CM | POA: Diagnosis not present

## 2020-05-30 DIAGNOSIS — J9611 Chronic respiratory failure with hypoxia: Secondary | ICD-10-CM | POA: Diagnosis not present

## 2020-05-30 DIAGNOSIS — M109 Gout, unspecified: Secondary | ICD-10-CM

## 2020-05-30 DIAGNOSIS — I5033 Acute on chronic diastolic (congestive) heart failure: Secondary | ICD-10-CM | POA: Diagnosis not present

## 2020-05-30 DIAGNOSIS — Z9181 History of falling: Secondary | ICD-10-CM

## 2020-05-30 DIAGNOSIS — Z7984 Long term (current) use of oral hypoglycemic drugs: Secondary | ICD-10-CM

## 2020-05-30 NOTE — Telephone Encounter (Signed)
Plan of care received, signed by Dr. Larose Kells, faxed back to [facility] and fax confirmation received. -JMA

## 2020-05-31 ENCOUNTER — Ambulatory Visit (INDEPENDENT_AMBULATORY_CARE_PROVIDER_SITE_OTHER): Payer: Medicare HMO | Admitting: Internal Medicine

## 2020-05-31 ENCOUNTER — Encounter: Payer: Self-pay | Admitting: Internal Medicine

## 2020-05-31 ENCOUNTER — Other Ambulatory Visit: Payer: Self-pay

## 2020-05-31 VITALS — BP 144/66 | HR 73 | Temp 97.5°F | Resp 20 | Ht 68.0 in | Wt 179.2 lb

## 2020-05-31 DIAGNOSIS — N19 Unspecified kidney failure: Secondary | ICD-10-CM

## 2020-05-31 DIAGNOSIS — C670 Malignant neoplasm of trigone of bladder: Secondary | ICD-10-CM | POA: Diagnosis not present

## 2020-05-31 DIAGNOSIS — D62 Acute posthemorrhagic anemia: Secondary | ICD-10-CM | POA: Diagnosis not present

## 2020-05-31 DIAGNOSIS — J439 Emphysema, unspecified: Secondary | ICD-10-CM

## 2020-05-31 DIAGNOSIS — I10 Essential (primary) hypertension: Secondary | ICD-10-CM

## 2020-05-31 LAB — CBC WITH DIFFERENTIAL/PLATELET
Basophils Absolute: 0 10*3/uL (ref 0.0–0.1)
Basophils Relative: 0.1 % (ref 0.0–3.0)
Eosinophils Absolute: 0.1 10*3/uL (ref 0.0–0.7)
Eosinophils Relative: 1.5 % (ref 0.0–5.0)
HCT: 32.6 % — ABNORMAL LOW (ref 39.0–52.0)
Hemoglobin: 10.6 g/dL — ABNORMAL LOW (ref 13.0–17.0)
Lymphocytes Relative: 10.8 % — ABNORMAL LOW (ref 12.0–46.0)
Lymphs Abs: 0.8 10*3/uL (ref 0.7–4.0)
MCHC: 32.4 g/dL (ref 30.0–36.0)
MCV: 94.7 fl (ref 78.0–100.0)
Monocytes Absolute: 0.6 10*3/uL (ref 0.1–1.0)
Monocytes Relative: 7.6 % (ref 3.0–12.0)
Neutro Abs: 5.9 10*3/uL (ref 1.4–7.7)
Neutrophils Relative %: 80 % — ABNORMAL HIGH (ref 43.0–77.0)
Platelets: 242 10*3/uL (ref 150.0–400.0)
RBC: 3.45 Mil/uL — ABNORMAL LOW (ref 4.22–5.81)
RDW: 20.4 % — ABNORMAL HIGH (ref 11.5–15.5)
WBC: 7.4 10*3/uL (ref 4.0–10.5)

## 2020-05-31 LAB — BASIC METABOLIC PANEL
BUN: 32 mg/dL — ABNORMAL HIGH (ref 6–23)
CO2: 28 mEq/L (ref 19–32)
Calcium: 8.8 mg/dL (ref 8.4–10.5)
Chloride: 102 mEq/L (ref 96–112)
Creatinine, Ser: 1.9 mg/dL — ABNORMAL HIGH (ref 0.40–1.50)
GFR: 32.19 mL/min — ABNORMAL LOW (ref >60.00)
Glucose, Bld: 137 mg/dL — ABNORMAL HIGH (ref 70–99)
Potassium: 4.7 mEq/L (ref 3.5–5.1)
Sodium: 136 mEq/L (ref 135–145)

## 2020-05-31 NOTE — Progress Notes (Signed)
Pre visit review using our clinic review tool, if applicable. No additional management support is needed unless otherwise documented below in the visit note. 

## 2020-05-31 NOTE — Patient Instructions (Addendum)
We are referring you to the pulmonary office and the gastroenterology office.   Please call [facility] at [phone] they schedule for both the [facility] and [employer], if you prefer this location please let them know that then.   Please call [facility] to schedule an appointment- their number is [phone] (we have let them know that you are requesting to switch providers)  GO TO THE LAB : Get the blood work     [place], [place] back for a checkup in 6 weeks

## 2020-05-31 NOTE — Progress Notes (Signed)
Subjective:    Patient ID: Bradley Johns, male    DOB: [birth year], 86 y.o.   MRN: [id number]  DOS:  05/31/2020 Type of visit - description: Follow-up, here with his daughter  Since the last office visit saw urology, note reviewed. Was not able to see GI. Ambulatory BPs reviewed.  Wt Readings from Last 3 Encounters:  05/31/20 179 lb 4 oz (81.3 kg)  05/07/20 179 lb (81.2 kg)  04/15/20 179 lb (81.2 kg)      Review of Systems Denies nausea vomiting.  No blood in the stools No further hematuria  Past Medical History:  Diagnosis Date  . Arthritis   . Benign prostatic hypertrophy   . COPD (chronic obstructive pulmonary disease) ([place])    24/7 O2  . Diabetes mellitus    per Dr Posey Pronto  . Epidermal cyst of face    left lower lateral cheek  . Gout   . Hyperlipidemia   . Hypertension   . Pulmonary fibrosis ([place]) 2014  . Sleep apnea     Past Surgical History:  Procedure Laterality Date  . COLONOSCOPY WITH PROPOFOL N/A 05/18/2018   Procedure: COLONOSCOPY WITH PROPOFOL;  Surgeon: Carol Ada, MD;  Location: [facility];  Service: Endoscopy;  Laterality: N/A;  . POLYPECTOMY  05/18/2018   Procedure: POLYPECTOMY;  Surgeon: Carol Ada, MD;  Location: [facility];  Service: Endoscopy;;  . TOE SURGERY      Allergies as of 05/31/2020   No Known Allergies     Medication List       Accurate as of May 31, 2020 11:59 PM. If you have any questions, ask your nurse or doctor.        STOP taking these medications   Accu-Chek Aviva Plus test strip Generic drug: glucose blood Stopped by: Kathlene November, MD   Accu-Chek Aviva Plus w/Device Kit Stopped by: Kathlene November, MD   Accu-Chek Softclix Lancets lancets Stopped by: Kathlene November, MD   B-D UF III MINI PEN NEEDLES 31G X 5 MM Misc Generic drug: Insulin Pen Needle Stopped by: Kathlene November, MD   metFORMIN 500 MG tablet Commonly known as: GLUCOPHAGE Stopped by: Kathlene November, MD     TAKE these medications   atorvastatin 80 MG  tablet Commonly known as: LIPITOR Take 1 tablet (80 mg total) by mouth daily. What changed: when to take this   carvedilol 3.125 MG tablet Commonly known as: COREG Take 1 tablet (3.125 mg total) by mouth 2 (two) times daily with a meal.   colchicine 0.6 MG tablet Take 1 tablet (0.6 mg total) by mouth 2 (two) times daily as needed.   doxazosin 4 MG tablet Commonly known as: CARDURA Take 1 tablet (4 mg total) by mouth at bedtime.   ferrous sulfate 325 (65 FE) MG tablet Take 1 tablet (325 mg total) by mouth daily with breakfast.   ketoconazole 2 % cream Commonly known as: NIZORAL Apply 1 application topically daily. What changed:   when to take this  reasons to take this   multivitamin tablet Take 1 tablet by mouth daily.   OXYGEN Inhale into the lungs. 3L Nocturnal and PRN for Hypoxemia   pioglitazone 45 MG tablet Commonly known as: ACTOS Take 45 mg by mouth daily.   saline Gel Place 1 application into both nostrils every 12 (twelve) hours.   sodium chloride 0.65 % Soln nasal spray Commonly known as: OCEAN Place 1 spray into both nostrils as needed for congestion.  Objective:   Physical Exam BP (!) 144/66 (BP Location: Right Arm, Patient Position: Sitting, Cuff Size: Normal)   Pulse 73   Temp (!) 97.5 F (36.4 C) (Oral)   Resp 20   Ht _0  (1.727 m)   Wt 179 lb 4 oz (81.3 kg)   SpO2 90%   BMI 27.25 kg/m  General:   Well developed, NAD, BMI noted.  HEENT:  Normocephalic . Face symmetric, atraumatic Lungs:  Decreased breath sounds Normal respiratory effort, no intercostal retractions, no accessory muscle use. Heart: RRR,  no murmur.  Abdomen:  Not distended, soft, non-tender. No rebound or rigidity.   Skin: Not pale. Not jaundice Lower extremities: no pretibial edema bilaterally  Neurologic:  alert & oriented X3.  Speech normal, gait not tested Psych--  Cognition and judgment appear intact.  Cooperative with normal attention span and  concentration.  Behavior appropriate. No anxious or depressed appearing.     Assessment     Assessment  DM Dr. Posey Pronto  HTN Hyperlipidemia DJD-- rarely uses hydrocodone Gout -- occ colchicine PULM: --COPD on oxygen  24/7   --OSA - no compliant w/ cpap --Pulmonary fibrosis BPH (Nocturia), has not seen urology, last PSA DRE  2016 wnl, myrbetriq -no help    PLAN Here with his daughter Severe anemia: Since last visit, hemoglobin increased from 8.0-10.4.  Request a referral to GI at this location, refer to [facility].  Check CBC.  On iron supplements Hematuria: Saw urology 2- 2-22, they recommended cystoscopy CT for further evaluation. To see them again today.  No further symptoms. 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 AKI: Rechecking labs DM: Well-controlled on Metformin and Actos, rechecking a BMP today, consider adjust or decrease Metformin. HTN: On carvedilol, Cardura, patient reports ambulatory BPs are unreliable, they are  "up-and-down". BP today 144/66, when he saw urology 05/09/2020 it was 117/79.  No change. Increased TSH: See last labs, recheck in few months COPD, O2 dependent, needs to see pulmonary.  Referral sent. RTC 6 weeks   This visit occurred during the SARS-CoV-2 public health emergency.  Safety protocols were in place, including screening questions prior to the visit, additional usage of staff PPE, and extensive cleaning of exam room while observing appropriate contact time as indicated for disinfecting solutions.

## 2020-06-01 DIAGNOSIS — C679 Malignant neoplasm of bladder, unspecified: Secondary | ICD-10-CM | POA: Insufficient documentation

## 2020-06-01 NOTE — Assessment & Plan Note (Signed)
Here with his daughter Severe anemia: Since last visit, hemoglobin increased from 8.0-10.4.  Request a referral to GI at this location, refer to [place] [facility] in [facility].  Check CBC.  On iron supplements Hematuria: Saw urology 2- 06-12-2022, they recommended cystoscopy CT for further evaluation. To see them again today.  No further symptoms. 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 AKI: Rechecking labs DM: Well-controlled on Metformin and Actos, rechecking a BMP today, consider adjust or decrease Metformin. HTN: On carvedilol, Cardura, patient reports ambulatory BPs are unreliable, they are  "up-and-down". BP today 144/66, when he saw urology 05/09/2020 it was 117/79.  No change. Increased TSH: See last labs, recheck in few months COPD, O2 dependent, needs to see pulmonary.  Referral sent. RTC 6 weeks

## 2020-06-04 DIAGNOSIS — J449 Chronic obstructive pulmonary disease, unspecified: Secondary | ICD-10-CM | POA: Diagnosis not present

## 2020-06-06 ENCOUNTER — Telehealth: Payer: Self-pay | Admitting: Pulmonary Disease

## 2020-06-06 NOTE — Telephone Encounter (Signed)
Okay with me 

## 2020-06-06 NOTE — Telephone Encounter (Signed)
Spoke with the pt  He is wanting to switch from Dr Elsworth Soho to Dr Silas Flood  Pt with PF  The front staff already scheduled pt with Dr Silas Flood bc they received a referral  Pt was informed of the protocol when switching providers  Please advise if okay to keep appt with Dr. Silas Flood, thanks

## 2020-06-07 ENCOUNTER — Other Ambulatory Visit: Payer: Self-pay | Admitting: Urology

## 2020-06-08 NOTE — Telephone Encounter (Signed)
Pt already scheduled so will close encounter

## 2020-06-14 ENCOUNTER — Other Ambulatory Visit: Payer: Self-pay

## 2020-06-14 ENCOUNTER — Encounter: Payer: Self-pay | Admitting: Pulmonary Disease

## 2020-06-14 ENCOUNTER — Ambulatory Visit: Payer: Medicare HMO | Admitting: Pulmonary Disease

## 2020-06-14 VITALS — BP 130/82 | HR 80 | Temp 97.4°F | Ht 68.0 in | Wt 178.2 lb

## 2020-06-14 DIAGNOSIS — J432 Centrilobular emphysema: Secondary | ICD-10-CM

## 2020-06-14 NOTE — Patient Instructions (Signed)
Nice to meet you  Seriously consider doing the spinal anesthesia if it is an option  I will send a note to your Urologist  Use the Duoneb (medicine sent home after rehab stay) via nebulizer twice a day. If it helps Let me know and I will send refills.  We will send an order for a nebulizer machine to be sent to your house.   Follow up with Dr. Silas Flood in 6 months or sooner as needed

## 2020-06-15 ENCOUNTER — Telehealth: Payer: Self-pay | Admitting: Internal Medicine

## 2020-06-15 ENCOUNTER — Telehealth: Payer: Self-pay | Admitting: Pulmonary Disease

## 2020-06-15 NOTE — Telephone Encounter (Signed)
Spoke w/ Pt's daughterBenjamine Mola- informed of results and recommendations from last 2 lab results. She will call nephrology back to schedule visit.

## 2020-06-15 NOTE — Telephone Encounter (Signed)
Patient states that he does not remember why Dr Larose Kells referred him out due to the blood work he did recently  Please call and advise

## 2020-06-15 NOTE — Telephone Encounter (Signed)
Called let patient's daughter know order was sent to [facility] Nothing further needed at this time.

## 2020-06-15 NOTE — Progress Notes (Signed)
@Patient  ID: Bradley Johns, male    DOB: November 24, 1934, 86 y.o.   MRN: [id number]  Chief Complaint  Patient presents with  . Establish Care    Switching from RA to [facility] for emphysema and surgical clearance. States he normally uses between 2 to 3 L of O2. Has noticed his SOB has increased. Increase non-productive cough.     Referring provider: Colon Branch, MD  HPI:   86 year old with COPD and possible pulmonary fibrosis whom we are seeing in consultation for preoperative evaluation.  Patient formally followed with Dr. Elsworth Soho.  Most recent pulmonary notes x2 reviewed.  Most recent urology note reviewed, planning urologic surgery which prompted referral.  Multiple notes from recent hospitalization reviewed.  Patient states he is doing okay.  He has baseline dyspnea on exertion.  He thinks is gradual worse over time but not much change over the last several months.  Certainly worse over the last couple months although he thinks he slowly getting stamina back.  He was hospitalized on Christmas Day 2021.  He had a fall at home.  Hemoglobin was low.  He received blood.  VQ scan was negative for PE.  He got much worse couple days later and was given Lasix and Solu-Medrol for worsening hypoxemia.  He has brief with described as bradycardic PEA arrest and received CPR and chemical resuscitation.  He was not intubated.  Unclear if he truly lost a pulse.  He was monitored in the ICU after this.  He suddenly improved and was discharged home.  Had a rehab stent.  He is now back at home.  Again dyspnea worse than prior.  Not much worse overall from several months ago.  He thinks he slowly get the strength back from hospitalization.  He is using oxygen as prescribed.  Using around 4 L mostly.  He is on 3 L today.  No recent upper respiratory infections in the last month.  Hemoglobin most recently last month was greater than 10.  His oxygen saturation today is 95% on supplemental O2.  Reviewed chest images most recent  chest x-ray 12/28 2021 which shows interstitial changes worsened from admission concerning for pulmonary edema on my interpretation.  Most recent cross-sectional imaging 06/09/2018 reviewed which shows significant emphysematous changes in the upper lobes, paraseptal and peripheral cystic changes throughout consistent with emphysema versus pulmonary fibrosis on my interpretation.  Most recent TTE 03/2020 reviewed with normal EF, reportedly normal diastolic pressures, dilated LA, MVR, mild AS, elevated RA pressures, RA size normal, RV size and function normal, moderately elevated PASP.  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  MMRC: mMRC Dyspnea Scale mMRC Score  06/14/2020 3    Epworth:  No flowsheet data found.  Tests:   FENO:  No results found for: NITRICOXIDE  PFT: PFT Results Latest Ref Rng & Units 04/21/2014  FVC-Pre L 1.99  FVC-Predicted Pre % 59  FVC-Post L 2.22  FVC-Predicted Post % 66  Pre FEV1/FVC % % 74  Post FEV1/FCV % % 74  FEV1-Pre L 1.47  FEV1-Predicted Pre % 60  FEV1-Post L  1.65  DLCO uncorrected ml/min/mmHg 8.10  DLCO UNC% % 27  DLVA Predicted % 51  TLC L 4.21  TLC % Predicted % 64  RV % Predicted % 81  PFTs reviewed on my interpretation revealed moderate restriction with reduced DLCO, normal FEV1/FVC ratio, FEV1 and FVC both reduced, DLCO severely reduced  WALK:  SIX MIN WALK 12/28/2012 12/28/2012 12/14/2012 12/14/2012  Supplimental Oxygen during Test? (L/min) Yes Yes Yes No  O2 Flow Rate 3 2 3  -  Type  Continuous Continuous Continuous -  Tech Comments: - Pt was placed on 3 liters O2 cont flowand then recovered 91%  Pt desaturated 1 st lap 83% 2 liters. Pt was placed on 3 liters cont flow and O2 sats was 91%. pt did 2 laps with 3 liters cont flow and sats maintained 95% -    Imaging: Personally reviewed and as per EMR discussion in this note  Lab Results: Personally reviewed, anemia improving, most recent hemoglobin 10.6, no elevated eosinophils CBC    Component Value Date/Time   WBC 7.4 05/31/2020 0924   RBC 3.45 (L) 05/31/2020 0924   HGB 10.6 (L) 05/31/2020 0924   HCT 32.6 (L) 05/31/2020 0924   PLT 242.0 05/31/2020 0924   MCV 94.7 05/31/2020 0924   MCH 25.7 (L) 04/13/2020 0101   MCHC 32.4 05/31/2020 0924   RDW 20.4 (H) 05/31/2020 0924   LYMPHSABS 0.8 05/31/2020 0924   MONOABS 0.6 05/31/2020 0924   EOSABS 0.1 05/31/2020 0924   BASOSABS 0.0 05/31/2020 0924    BMET    Component Value Date/Time   NA 136 05/31/2020 0924   K 4.7 05/31/2020 0924   CL 102 05/31/2020 0924   CO2 28 05/31/2020 0924   GLUCOSE 137 (H) 05/31/2020 0924   GLUCOSE 176 07/12/2008 0000   BUN 32 (H) 05/31/2020 0924   CREATININE 1.90 (H) 05/31/2020 0924   CREATININE 1.36 (H) 12/21/2015 1430   CALCIUM 8.8 05/31/2020 0924   GFRNONAA 54 (L) 04/13/2020 0101   GFRAA >60 05/25/2018 0310    BNP    Component Value Date/Time   BNP 86.4 04/06/2020 0146    ProBNP    Component Value Date/Time   PROBNP 30.0 12/13/2012 1615    Specialty Problems      Pulmonary Problems   COPD with emphysema, on O2    FEV1 67%       Sleep apnea           Dyspnea on exertion   Pulmonary fibrosis (HCC)    Favor IPF DLCO 32%      Chronic respiratory failure with hypoxia (HCC)   Acute respiratory distress   Hypoxia      No Known Allergies  Immunization History  Administered Date(s) Administered  . Fluad Quad(high Dose 65+) 12/31/2018, 05/07/2020  . Influenza Split 03/18/2011  . Influenza Whole  01/10/2009, 01/11/2010  . Influenza, High Dose Seasonal PF 02/14/2013, 12/29/2014, 12/21/2015, 03/10/2017, 01/11/2018  . Influenza, Seasonal, Injecte, Preservative Fre 04/06/2012  . Influenza,inj,Quad PF,6+ Mos 02/16/2014  . PFIZER(Purple Top)SARS-COV-2 Vaccination 06/30/2019, 07/21/2019, 01/26/2020  . Pneumococcal Conjugate-13 12/29/2014  . Pneumococcal Polysaccharide-23 10/20/1999, 07/11/2008, 06/15/2013  . Td 04/10/1999, 01/11/2010    Past Medical History:  Diagnosis Date  . Arthritis   . Benign prostatic hypertrophy   . COPD (chronic obstructive pulmonary disease) ([place])    24/7 O2  . Diabetes mellitus    per Dr Posey Pronto  . Epidermal cyst of face    left lower lateral cheek  . Gout   .  Hyperlipidemia   . Hypertension   . Pulmonary fibrosis ([place]) 2014  . Sleep apnea   . Urothelial carcinoma ([place]) 05/24/2020    Tobacco History: Social History   Tobacco Use  Smoking Status Former Smoker  . Packs/day: 1.50  . Years: 40.00  . Pack years: 60.00  . Types: Cigarettes  . Quit date: 04/07/1996  . Years since quitting: 24.2  Smokeless Tobacco Never Used   Counseling given: Not Answered   Continue to not smoke  Outpatient Encounter Medications as of 06/14/2020  Medication Sig  . atorvastatin (LIPITOR) 80 MG tablet Take 1 tablet (80 mg total) by mouth daily. (Patient taking differently: Take 80 mg by mouth every evening.)  . carvedilol (COREG) 3.125 MG tablet Take 1 tablet (3.125 mg total) by mouth 2 (two) times daily with a meal.  . colchicine 0.6 MG tablet Take 1 tablet (0.6 mg total) by mouth 2 (two) times daily as needed.  . doxazosin (CARDURA) 4 MG tablet Take 1 tablet (4 mg total) by mouth at bedtime.  . ferrous sulfate 325 (65 FE) MG tablet Take 1 tablet (325 mg total) by mouth daily with breakfast.  . ketoconazole (NIZORAL) 2 % cream Apply 1 application topically daily. (Patient taking differently: Apply 1 application topically daily as needed (moisture around groin).)   . Multiple Vitamin (MULTIVITAMIN) tablet Take 1 tablet by mouth daily.  . OXYGEN Inhale into the lungs. 3L Nocturnal and PRN for Hypoxemia  . pioglitazone (ACTOS) 45 MG tablet Take 45 mg by mouth daily.  . saline (AYR) GEL Place 1 application into both nostrils every 12 (twelve) hours.  . sodium chloride (OCEAN) 0.65 % SOLN nasal spray Place 1 spray into both nostrils as needed for congestion.   No facility-administered encounter medications on file as of 06/14/2020.     Review of Systems  Review of Systems  No chest pain exertion.  No orthopnea or PND.  Comprehensive review of  systems otherwise negative. Physical Exam  BP 130/82   Pulse 80   Temp (!) 97.4 F (36.3 C) (Temporal)   Ht 5\' 8"  (1.727 m)   Wt 178 lb 3.2 oz (80.8 kg)   SpO2 95% Comment: on 3L  BMI 27.10 kg/m   Wt Readings from Last 5 Encounters:  06/14/20 178 lb 3.2 oz (80.8 kg)  05/31/20 179 lb 4 oz (81.3 kg)  05/07/20 179 lb (81.2 kg)  04/15/20 179 lb (81.2 kg)  04/13/20 173 lb 1 oz (78.5 kg)    BMI Readings from Last 5 Encounters:  06/14/20 27.10 kg/m  05/31/20 27.25 kg/m  05/07/20 27.22 kg/m  04/15/20 27.22 kg/m  04/13/20 26.31 kg/m     Physical Exam General: Thin, sitting in wheelchair Neck: Supple, no JVP Eyes: EOMI, no icterus Pulmonary: Distant sounds throughout, rhonchi in bilateral bases Cardiovascular: Regular rate and rhythm, no murmur Abdomen: Nondistended, bowel sounds present MSK: No synovitis, no joint effusion Neuro: In wheelchair, no focal weakness Psych: Normal mood, full affect   Assessment & Plan:   Dyspnea exertion: Multifactorial, related to COPD, deconditioning, possible pulmonary fibrosis although do query if this is just septal and peripheral emphysematous changes, significant valvular disease, diastolic dysfunction, left atrial hypertension.   COPD: Presumed based on emphysema, PFTs showed restrictive pattern although flow volume curve consistent with obstruction.   Nebulizer machine ordered today.  Instructed to use DuoNebs twice daily.  If helpful, would plan to escalate bronchodilator therapy.  Preoperative evaluation: I do not provide clearance but preoperative risk  assessment.  Based on age, O2 saturation today, hemoglobin greater than 10 on recent check, no recent upper respiratory infection, peripheral nature of surgery, and duration presume less than 2 hours, the patient has a low (1.3%) risk of pulmonary complication based on ARISCAT model.  Based on the same model, his maximum risk would be intermediate if surgery is greater than 3 hours, this pertains a 13% risk of pulmonary complication.  His significant parenchymal disease may provide higher risk than predicted.  Would highly recommend spinal or regional anesthesia to avoid general anesthesia and time on the ventilator if possible.  Patient indicated this seemed like a option, he was leery of this but I highly encouraged him to take this option. --Use regional, spinal anesthesia if able, --Avoid neuromuscular blockade if paresthesia as needed --Provide duo nebs preoperatively and in the PACU as well as every 6 hours as needed if patient remains admitted after surgery --Recommend arterial line for intraoperative blood gases if general anesthesia is needed --If patient is retaining CO2 during procedure, recommend postoperative postextubation blood gas as well to evaluate for CO2 retention --Given emphysema, patient should be extubated to BiPAP --If patient is admitted to the hospital, recommend nebulized budesonide twice daily, formoterol or arformoterol nebulized twice daily, Yupelri nebulized daily for maximum inhaled therapy for underlying lung disease   Return in about 6 months (around 12/15/2020).   Lanier Clam, MD 06/15/2020   This appointment required 90 minutes of patient care (this includes precharting, chart review, review of results, face-to-face care, etc.).

## 2020-06-21 ENCOUNTER — Other Ambulatory Visit: Payer: Self-pay

## 2020-06-21 ENCOUNTER — Encounter: Payer: Self-pay | Admitting: Gastroenterology

## 2020-06-21 ENCOUNTER — Ambulatory Visit (INDEPENDENT_AMBULATORY_CARE_PROVIDER_SITE_OTHER): Payer: Medicare HMO | Admitting: Gastroenterology

## 2020-06-21 VITALS — BP 148/66 | HR 67 | Ht 68.0 in | Wt 179.3 lb

## 2020-06-21 DIAGNOSIS — D509 Iron deficiency anemia, unspecified: Secondary | ICD-10-CM | POA: Diagnosis not present

## 2020-06-21 DIAGNOSIS — J439 Emphysema, unspecified: Secondary | ICD-10-CM

## 2020-06-21 DIAGNOSIS — C679 Malignant neoplasm of bladder, unspecified: Secondary | ICD-10-CM

## 2020-06-21 DIAGNOSIS — R319 Hematuria, unspecified: Secondary | ICD-10-CM

## 2020-06-21 NOTE — Progress Notes (Signed)
Chief Complaint: Anemia  Referring Provider:     Colon Branch, MD  HPI:     Bradley Johns is a 86 y.o. male history of COPD with emphysema, possible pulmonary fibrosis, chronic 2-3 L O2, diastolic heart failure (preserved EF on TTE in 03/2020), diabetes, HTN, HLD, CKD 3B, OSA, BPH, gout, referred to the [facility] for evaluation of iron deficiency anemia.  Hospitalized 12/25-1/7 for symptomatic IDA (dyspnea, syncopal episode at home), with Hgb 5 on arrival.  Transfused 2 units PRBCs.  VQ scan negative.  Increased O2 demand, treated with IV Lasix, Solu-Medrol.  PEA arrest on 04/03/2020 with 1 round of CPR of achieving ROSC.  Diuresed for pulmonary edema.  Treated with IV Feraheme x2 with Hgb 8.0 on DC.  03/31/2020: -H/H 5/19.8, ferritin 11, iron 20, sat 5%.  FOBT negative.  Normal B12, folate  05/07/2020: -H/H 10.4/32.8, ferritin 101, iron 51  05/31/2020: -H/H 10.6/32.6  Prior to hospitalization, had been having hematuria for a year. Was seen outpatient by Urology with subsequent cystoscopy and CT.  Per PCM notes, CT notable for urothelial carcinoma without mets and he is scheduled for bladder surgery on 08/02/20.  Otherwise, no hematochezia or melena. No abdomninal pain, n/v/f/c/d/c.    Currently taking PO iron. No anticoagulation.   No known family history of CRC, GI malignancy, liver disease, pancreatic disease, or IBD.  Hgb 09/2017 was 15.5. Repeat labs since then were in 05/2018 during admission for hematochezia and 03/2020 for admission for IDA as above, so baseline Hgb not entirely clear.   Endoscopic History: -Colonoscopy (05/2018, Dr. Benson Norway): 4 mm rectal hyperplastic polyp, diverticulosis -Colonoscopy (07/2009, Dr. Collene Mares): Tubular adenoma, internal hemorrhoids   Past Medical History:  Diagnosis Date  . Anemia   . Arthritis   . Benign prostatic hypertrophy   . COPD (chronic obstructive pulmonary disease) ([place])    24/7 O2  . Diabetes mellitus     per Dr Posey Pronto  . Epidermal cyst of face    left lower lateral cheek  . Gout   . History of colon polyps   . Hyperlipidemia   . Hypertension   . Kidney disease   . Pulmonary fibrosis ([place]) 2014  . Sleep apnea   . Urothelial carcinoma ([place]) 05/24/2020     Past Surgical History:  Procedure Laterality Date  . COLONOSCOPY WITH PROPOFOL N/A 05/18/2018   Procedure: COLONOSCOPY WITH PROPOFOL;  Surgeon: Carol Ada, MD;  Location: [facility] ENDOSCOPY;  Service: Endoscopy;  Laterality: N/A;  . POLYPECTOMY  05/18/2018   Procedure: POLYPECTOMY;  Surgeon: Carol Ada, MD;  Location: [facility] ENDOSCOPY;  Service: Endoscopy;;  . TOE SURGERY     Family History  Problem Relation Age of Onset  . Liver disease Mother   . Coronary artery disease Father 94  . Diabetes Sister   . Cancer Brother        type?  Marland Kitchen Hypertension Neg Hx   . Hyperlipidemia Neg Hx   . Heart attack Neg Hx   . Prostate cancer Neg Hx   . Colon cancer Neg Hx    Social History   Tobacco Use  . Smoking status: Former Smoker    Packs/day: 1.50    Years: 40.00    Pack years: 60.00    Types: Cigarettes    Quit date: 04/07/1996    Years since quitting: 24.2  . Smokeless tobacco: Never Used  Vaping Use  . Vaping Use:  Never used  Substance Use Topics  . Alcohol use: Yes    Comment: occassional beer  . Drug use: No   Current Outpatient Medications  Medication Sig Dispense Refill  . atorvastatin (LIPITOR) 80 MG tablet Take 1 tablet (80 mg total) by mouth daily. (Patient taking differently: Take 80 mg by mouth every evening.) 90 tablet 1  . carvedilol (COREG) 3.125 MG tablet Take 1 tablet (3.125 mg total) by mouth 2 (two) times daily with a meal.    . colchicine 0.6 MG tablet Take 1 tablet (0.6 mg total) by mouth 2 (two) times daily as needed. 60 tablet 0  . doxazosin (CARDURA) 4 MG tablet Take 1 tablet (4 mg total) by mouth at bedtime. 90 tablet 0  . ferrous sulfate 325 (65 FE) MG tablet Take 1 tablet (325 mg total) by mouth daily  with breakfast.  3  . ketoconazole (NIZORAL) 2 % cream Apply 1 application topically daily. (Patient taking differently: Apply 1 application topically daily as needed (moisture around groin).) 30 g 3  . Multiple Vitamin (MULTIVITAMIN) tablet Take 1 tablet by mouth daily.    . OXYGEN Inhale into the lungs. 3L Nocturnal and PRN for Hypoxemia    . pioglitazone (ACTOS) 45 MG tablet Take 45 mg by mouth daily.    . saline (AYR) GEL Place 1 application into both nostrils every 12 (twelve) hours.  0  . sodium chloride (OCEAN) 0.65 % SOLN nasal spray Place 1 spray into both nostrils as needed for congestion.  0   No current facility-administered medications for this visit.   No Known Allergies   Review of Systems: All systems reviewed and negative except where noted in HPI.     Physical Exam:    Wt Readings from Last 3 Encounters:  06/21/20 179 lb 5 oz (81.3 kg)  06/14/20 178 lb 3.2 oz (80.8 kg)  05/31/20 179 lb 4 oz (81.3 kg)    BP (!) 148/66   Pulse 67   Ht 5\' 8"  (1.727 m)   Wt 179 lb 5 oz (81.3 kg)   BMI 27.26 kg/m  Constitutional:  Pleasant, in no acute distress.  Supplemental O2 via nasal cannula Psychiatric: Normal mood and affect. Behavior is normal. Abdominal: Soft, nondistended, nontender. Bowel sounds active throughout. There are no masses palpable. No hepatomegaly. Skin: Skin is warm and dry. No rashes noted.   ASSESSMENT AND PLAN;   1) Iron deficiency anemia 2) Urothelial carcinoma 3) Hematuria -Suspect IDA is from GU pathology.  He is scheduled for bladder surgery next month.  He is otherwise without overt GI blood loss and recent FOBT was negative.  Given overall health and lack of GI symptoms, along with improving H/H and iron indices, plan to allow completion of GU work-up first.  If anemia should persist, can plan for further GI evaluation to include EGD/colonoscopy and possible VCE - RTC in July after surgery and f/u with Urology (scheduled for June). - Continue  PO iron  4) COPD, O2 dependent 5) History of PEA arrest -If planning on any endoscopic evaluation, needs to be scheduled at [facility] -Discussed his significant comorbidities with relation to any endoscopic evaluation and increased perioperative risks   Lavena Bullion, DO, FACG  06/21/2020, 11:20 AM   Colon Branch, MD

## 2020-06-21 NOTE — Patient Instructions (Signed)
If you are age 84 or older, your body mass index should be between 23-30. Your Body mass index is 27.26 kg/m. If this is out of the aforementioned range listed, please consider follow up with your Primary Care Provider.  If you are age 39 or younger, your body mass index should be between 19-25. Your Body mass index is 27.26 kg/m. If this is out of the aformentioned range listed, please consider follow up with your Primary Care Provider.   Due to recent changes in healthcare laws, you may see the results of your imaging and laboratory studies on MyChart before your provider has had a chance to review them.  We understand that in some cases there may be results that are confusing or concerning to you. Not all laboratory results come back in the same time frame and the provider may be waiting for multiple results in order to interpret others.  Please give [place] 48 hours in order for your provider to thoroughly review all the results before contacting the office for clarification of your results.   Please contact our office in June 2022 to schedule a follow up appointment for July 2022. [phone]  Thank you for choosing me and [facility].  Vito Cirigliano, D.O.

## 2020-06-27 ENCOUNTER — Encounter: Payer: Self-pay | Admitting: Internal Medicine

## 2020-07-12 ENCOUNTER — Ambulatory Visit: Payer: Medicare HMO | Admitting: Internal Medicine

## 2020-07-25 NOTE — Patient Instructions (Addendum)
DUE TO COVID-19 ONLY ONE VISITOR IS ALLOWED TO COME WITH YOU AND STAY IN THE WAITING ROOM ONLY DURING PRE OP AND PROCEDURE DAY OF SURGERY. THE 1 VISITOR  MAY VISIT WITH YOU AFTER SURGERY IN YOUR PRIVATE ROOM DURING VISITING HOURS ONLY!  YOU NEED TO HAVE A COVID 19 TEST ON__4/26_____ @_2 :40 PM______, THIS TEST MUST BE DONE BEFORE SURGERY,  COVID TESTING SITE [place] [address], IT IS ON THE RIGHT GOING OUT [address] APPROXIMATELY  2 MINUTES PAST [employer] ON THE RIGHT. ONCE YOUR COVID TEST IS COMPLETED,  PLEASE BEGIN THE QUARANTINE INSTRUCTIONS AS OUTLINED IN YOUR HANDOUT.                [name]   Your procedure is scheduled on: 08/02/20   Report to [facility] Main  Entrance   Report to admitting at 10:30 AM     Call this number if you have problems the morning of surgery [phone]    Remember: Do not eat food after Midnight.  You may have clear liquids until 9:00 am   BRUSH YOUR TEETH MORNING OF SURGERY AND RINSE YOUR MOUTH OUT, NO CHEWING GUM CANDY OR MINTS.     Take these medicines the morning of surgery with A SIP OF WATER: Carvedilol. Bring your mask and tubing to the hospital  How to Manage Your Diabetes Before and After Surgery  Why is it important to control my blood sugar before and after surgery? . Improving blood sugar levels before and after surgery helps healing and can limit problems. . A way of improving blood sugar control is eating a healthy diet by: o  Eating less sugar and carbohydrates o  Increasing activity/exercise o  Talking with your doctor about reaching your blood sugar goals . High blood sugars (greater than 180 mg/dL) can raise your risk of infections and slow your recovery, so you will need to focus on controlling your diabetes during the weeks before surgery. . Make sure that the doctor who takes care of your diabetes knows about your planned surgery including the date and location.  How  do I manage my blood sugar before surgery? . Check your blood sugar at least 4 times a day, starting 2 days before surgery, to make sure that the level is not too high or low. o Check your blood sugar the morning of your surgery when you wake up and every 2 hours until you get to the Short Stay unit. . If your blood sugar is less than 70 mg/dL, you will need to treat for low blood sugar: o Do not take insulin. o Treat a low blood sugar (less than 70 mg/dL) with  cup of clear juice (cranberry or apple), 4 glucose tablets, OR glucose gel. o Recheck blood sugar in 15 minutes after treatment (to make sure it is greater than 70 mg/dL). If your blood sugar is not greater than 70 mg/dL on recheck, call [phone] for further instructions. . Report your blood sugar to the short stay nurse when you get to Short Stay.  . If you are admitted to the hospital after surgery: o Your blood sugar will be checked by the staff and you will probably be given insulin after surgery (instead of oral diabetes medicines) to make sure you have good blood sugar levels. o The goal for blood sugar control after surgery is 80-180 mg/dL.   WHAT DO I DO ABOUT MY DIABETES MEDICATION?  Marland Kitchen  Do not take oral diabetes medicines (pills) the morning of surgery.                                 You may not have any metal on your body including              piercings  Do not wear jewelry,  lotions, powders or deodorant              Men may shave face and neck.   Do not bring valuables to the hospital. [place].  Contacts, dentures or bridgework may not be worn into surgery.      Patients discharged the day of surgery will not be allowed to drive home.   IF YOU ARE HAVING SURGERY AND GOING HOME THE SAME DAY, YOU MUST HAVE AN ADULT TO DRIVE YOU HOME AND BE WITH YOU FOR 24 HOURS.   YOU MAY GO HOME BY TAXI OR UBER OR ORTHERWISE, BUT AN ADULT MUST ACCOMPANY YOU HOME AND STAY  WITH YOU FOR 24 HOURS.  Name and phone number of your driver:  Special Instructions: N/A              Please read over the following fact sheets you were given: _____________________________________________________________________             [facility] - Preparing for Surgery Before surgery, you can play an important role.  Because skin is not sterile, your skin needs to be as free of germs as possible.  You can reduce the number of germs on your skin by washing with CHG (chlorahexidine gluconate) soap before surgery.  CHG is an antiseptic cleaner which kills germs and bonds with the skin to continue killing germs even after washing. Please DO NOT use if you have an allergy to CHG or antibacterial soaps.  If your skin becomes reddened/irritated stop using the CHG and inform your nurse when you arrive at Short Stay.  You may shave your face/neck.  Please follow these instructions carefully:  1.  Shower with CHG Soap the night before surgery and the  morning of Surgery.  2.  If you choose to wash your hair, wash your hair first as usual with your  normal  shampoo.  3.  After you shampoo, rinse your hair and body thoroughly to remove the  shampoo.                                        4.  Use CHG as you would any other liquid soap.  You can apply chg directly  to the skin and wash                       Gently with a scrungie or clean washcloth.  5.  Apply the CHG Soap to your body ONLY FROM THE NECK DOWN.   Do not use on face/ open                           Wound or open sores. Avoid contact with eyes, ears mouth and genitals (private parts).  Wash face,  Genitals (private parts) with your normal soap.             6.  Wash thoroughly, paying special attention to the area where your surgery  will be performed.  7.  Thoroughly rinse your body with warm water from the neck down.  8.  DO NOT shower/wash with your normal soap after using and rinsing off  the CHG Soap.              9.  Pat yourself dry with a clean towel.            10.  Wear clean pajamas.            11.  Place clean sheets on your bed the night of your first shower and do not  sleep with pets. Day of Surgery : Do not apply any lotions/deodorants the morning of surgery.  Please wear clean clothes to the hospital/surgery center.  FAILURE TO FOLLOW THESE INSTRUCTIONS MAY RESULT IN THE CANCELLATION OF YOUR SURGERY PATIENT SIGNATURE_________________________________  NURSE SIGNATURE__________________________________  ________________________________________________________________________

## 2020-07-26 ENCOUNTER — Encounter (HOSPITAL_COMMUNITY)
Admission: RE | Admit: 2020-07-26 | Discharge: 2020-07-26 | Disposition: A | Discharge status: Home / Self Care | Payer: Medicare HMO | Source: Ambulatory Visit | Attending: Urology | Admitting: Urology

## 2020-07-26 ENCOUNTER — Other Ambulatory Visit: Payer: Self-pay

## 2020-07-26 ENCOUNTER — Encounter (HOSPITAL_COMMUNITY): Payer: Self-pay

## 2020-07-26 DIAGNOSIS — Z01812 Encounter for preprocedural laboratory examination: Secondary | ICD-10-CM | POA: Diagnosis not present

## 2020-07-26 HISTORY — DX: Personal history of urinary calculi: Z87.442

## 2020-07-26 LAB — CBC
HCT: 36.9 % — ABNORMAL LOW (ref 39.0–52.0)
Hemoglobin: 11.4 g/dL — ABNORMAL LOW (ref 13.0–17.0)
MCH: 29.8 pg (ref 26.0–34.0)
MCHC: 30.9 g/dL (ref 30.0–36.0)
MCV: 96.6 fL (ref 80.0–100.0)
Platelets: 195 10*3/uL (ref 150–400)
RBC: 3.82 MIL/uL — ABNORMAL LOW (ref 4.22–5.81)
RDW: 14.1 % (ref 11.5–15.5)
WBC: 7.1 10*3/uL (ref 4.0–10.5)
nRBC: 0 % (ref 0.0–0.2)

## 2020-07-26 LAB — BASIC METABOLIC PANEL
Anion gap: 7 (ref 5–15)
BUN: 47 mg/dL — ABNORMAL HIGH (ref 8–23)
CO2: 26 mmol/L (ref 22–32)
Calcium: 9.2 mg/dL (ref 8.9–10.3)
Chloride: 106 mmol/L (ref 98–111)
Creatinine, Ser: 2.11 mg/dL — ABNORMAL HIGH (ref 0.61–1.24)
GFR, Estimated: 30 mL/min — ABNORMAL LOW (ref >60)
Glucose, Bld: 87 mg/dL (ref 70–99)
Potassium: 5 mmol/L (ref 3.5–5.1)
Sodium: 139 mmol/L (ref 135–145)

## 2020-07-26 LAB — GLUCOSE, CAPILLARY: Glucose-Capillary: 76 mg/dL (ref 70–99)

## 2020-07-26 LAB — HEMOGLOBIN A1C
Hgb A1c MFr Bld: 6.4 % — ABNORMAL HIGH (ref 4.8–5.6)
Mean Plasma Glucose: 136.98 mg/dL

## 2020-07-26 NOTE — Progress Notes (Signed)
COVID Vaccine Completed:Yes Date COVID Vaccine completed:07/21/19-booster 01/26/20 COVID vaccine manufacturer: Pfizer      PCP - Dr. French Ana Cardiologist - none Pulmonologist-Dr. Rockwell Alexandria and Dr. Silas Flood   Chest x-ray - 04/03/20-epic EKG - 04/03/20-epic Stress Test - no ECHO - 04/01/20-epic Cardiac Cath - no Pacemaker/ICD device last checked:NA  Sleep Study - yes CPAP - no  Fasting Blood Sugar - 87-130 Checks Blood Sugar _TID____ times a day  Blood Thinner Instructions:NA Aspirin Instructions: Last Dose:  Anesthesia review:   Patient denies shortness of breath, fever, cough and chest pain at PAT appointment Pt uses O2 at home all the time his O2 sats run in the low 90s. He gets SOB with most activities including ADLs. He lives with his wife.  Patient verbalized understanding of instructions that were given to them at the PAT appointment. Patient was also instructed that they will need to review over the PAT instructions again at home before surgery.yes. Pt has not been taking his iron.

## 2020-07-30 NOTE — Progress Notes (Signed)
Patient does have significant pulmonary issues.  I would suggest that for his  trans urethral resection of bladder tumor we consider spinal anesthesia.

## 2020-07-30 NOTE — Anesthesia Preprocedure Evaluation (Addendum)
Anesthesia Evaluation  Patient identified by MRN, date of birth, ID band Patient awake    Reviewed: Allergy & Precautions, NPO status , Patient's Chart, lab work & pertinent test results, reviewed documented beta blocker date and time   Airway Mallampati: III  TM Distance: >3 FB Neck ROM: Full    Dental  (+) Missing, Poor Dentition, Dental Advisory Given,    Pulmonary sleep apnea and Continuous Positive Airway Pressure Ventilation , COPD,  COPD inhaler and oxygen dependent, former smoker,  Pulmonary fibrosis On home O2 at 3l/[place]   Pulmonary exam normal breath sounds clear to auscultation       Cardiovascular hypertension, Pt. on medications Normal cardiovascular exam Rhythm:Regular Rate:Normal  EKG 03/2820 NSR, non specific ST-T abnormalities which are new  Echo 04/01/20 1. Left ventricular ejection fraction, by estimation, is 60 to 65%. The left ventricle has normal function. The left ventricle has no regional wall motion abnormalities. There is mild concentric left ventricular hypertrophy. Left ventricular diastolic parameters were normal.  2. Right ventricular systolic function is normal. The right ventricular size is normal. There is moderately elevated pulmonary artery systolic pressure. The estimated right ventricular systolic pressure is 96.7 mmHg.  3. Left atrial size was mildly dilated.  4. The mitral valve is normal in structure. Mild mitral valve  regurgitation. No evidence of mitral stenosis.  5. Tricuspid valve regurgitation is mild to moderate.  6. The aortic valve is normal in structure. There is moderate  calcification of the aortic valve. There is moderate thickening of the aortic valve. Aortic valve regurgitation is not visualized. Mild aortic alve stenosis. Aortic valve mean gradient measures 9.0 mmHg.  7. The inferior vena cava is dilated in size with >50% respiratory variability, suggesting right atrial  pressure of 8 mmHg.    Neuro/Psych  Neuromuscular disease    GI/Hepatic negative GI ROS, Neg liver ROS,   Endo/Other  diabetes, Well Controlled, Type 2, Oral Hypoglycemic AgentsHyperlipidemia  Renal/GU Renal InsufficiencyRenal diseaseHx/o renal calculi   Bladder Ca BPH    Musculoskeletal  (+) Arthritis , Osteoarthritis,    Abdominal   Peds  Hematology  (+) anemia ,   Anesthesia Other Findings   Reproductive/Obstetrics                            Anesthesia Physical Anesthesia Plan  ASA: IV  Anesthesia Plan: Spinal   Post-op Pain Management:    Induction: Intravenous  PONV Risk Score and Plan: 3 and Treatment may vary due to age or medical condition  Airway Management Planned: Natural Airway, Simple Face Mask and Nasal Cannula  Additional Equipment:   Intra-op Plan:   Post-operative Plan:   Informed Consent: I have reviewed the patients History and Physical, chart, labs and discussed the procedure including the risks, benefits and alternatives for the proposed anesthesia with the patient or authorized representative who has indicated his/her understanding and acceptance.     Dental advisory given  Plan Discussed with: CRNA and Anesthesiologist  Anesthesia Plan Comments: (Per pulmonology note 06/14/20, "Preoperative evaluation: I do not provide clearance but preoperative risk assessment.  Based on age, O2 saturation today, hemoglobin greater than 10 on recent check, no recent upper respiratory infection, peripheral nature of surgery, and duration presume less than 2 hours, the patient has a low (1.3%) risk of pulmonary complication based on ARISCAT model.  Based on the same model, his maximum risk would be intermediate if surgery is greater than 3  hours, this pertains a 13% risk of pulmonary complication.  His significant parenchymal disease may provide higher risk than predicted.  Would highly recommend spinal or regional anesthesia to  avoid general anesthesia and time on the ventilator if possible.  Patient indicated this seemed like a option, he was leery of this but I highly encouraged him to take this option. --Use regional, spinal anesthesia if able, --Avoid neuromuscular blockade if paresthesia as needed --Provide duo nebs preoperatively and in the PACU as well as every 6 hours as needed if patient remains admitted after surgery --Recommend arterial line for intraoperative blood gases if general anesthesia is needed --If patient is retaining CO2 during procedure, recommend postoperative postextubation blood gas as well to evaluate for CO2 retention --Given emphysema, patient should be extubated to BiPAP --If patient is admitted to the hospital, recommend nebulized budesonide twice daily, formoterol or arformoterol nebulized twice daily, Yupelri nebulized daily for maximum inhaled therapy for underlying lung disease")       Anesthesia Quick Evaluation

## 2020-07-31 ENCOUNTER — Other Ambulatory Visit (HOSPITAL_COMMUNITY)
Admission: RE | Admit: 2020-07-31 | Discharge: 2020-07-31 | Disposition: A | Discharge status: Home / Self Care | Payer: Medicare HMO | Source: Ambulatory Visit | Attending: Urology | Admitting: Urology

## 2020-07-31 DIAGNOSIS — Z20822 Contact with and (suspected) exposure to covid-19: Secondary | ICD-10-CM | POA: Insufficient documentation

## 2020-07-31 DIAGNOSIS — Z01812 Encounter for preprocedural laboratory examination: Secondary | ICD-10-CM | POA: Insufficient documentation

## 2020-08-01 LAB — SARS CORONAVIRUS 2 (TAT 6-24 HRS): SARS Coronavirus 2: NEGATIVE

## 2020-08-02 ENCOUNTER — Encounter (HOSPITAL_COMMUNITY)
Admission: RE | Disposition: A | Discharge status: Home / Self Care | Payer: Self-pay | Source: Home / Self Care | Attending: Internal Medicine

## 2020-08-02 ENCOUNTER — Ambulatory Visit (HOSPITAL_COMMUNITY): Payer: Medicare HMO | Admitting: Physician Assistant

## 2020-08-02 ENCOUNTER — Other Ambulatory Visit: Payer: Self-pay

## 2020-08-02 ENCOUNTER — Observation Stay (HOSPITAL_COMMUNITY): Payer: Medicare HMO

## 2020-08-02 ENCOUNTER — Encounter (HOSPITAL_COMMUNITY): Payer: Self-pay | Admitting: Urology

## 2020-08-02 ENCOUNTER — Ambulatory Visit (HOSPITAL_COMMUNITY): Payer: Medicare HMO | Admitting: Registered Nurse

## 2020-08-02 ENCOUNTER — Inpatient Hospital Stay (HOSPITAL_COMMUNITY)
Admission: RE | Admit: 2020-08-02 | Discharge: 2020-08-08 | DRG: 987 | Disposition: A | Discharge status: Home / Self Care | Payer: Medicare HMO | Attending: Internal Medicine | Admitting: Internal Medicine

## 2020-08-02 DIAGNOSIS — C670 Malignant neoplasm of trigone of bladder: Secondary | ICD-10-CM | POA: Diagnosis present

## 2020-08-02 DIAGNOSIS — J439 Emphysema, unspecified: Secondary | ICD-10-CM | POA: Diagnosis present

## 2020-08-02 DIAGNOSIS — Z8674 Personal history of sudden cardiac arrest: Secondary | ICD-10-CM

## 2020-08-02 DIAGNOSIS — C679 Malignant neoplasm of bladder, unspecified: Secondary | ICD-10-CM | POA: Diagnosis present

## 2020-08-02 DIAGNOSIS — N400 Enlarged prostate without lower urinary tract symptoms: Secondary | ICD-10-CM | POA: Diagnosis present

## 2020-08-02 DIAGNOSIS — E1122 Type 2 diabetes mellitus with diabetic chronic kidney disease: Secondary | ICD-10-CM | POA: Diagnosis present

## 2020-08-02 DIAGNOSIS — Z9981 Dependence on supplemental oxygen: Secondary | ICD-10-CM

## 2020-08-02 DIAGNOSIS — I10 Essential (primary) hypertension: Secondary | ICD-10-CM | POA: Diagnosis not present

## 2020-08-02 DIAGNOSIS — Y838 Other surgical procedures as the cause of abnormal reaction of the patient, or of later complication, without mention of misadventure at the time of the procedure: Secondary | ICD-10-CM | POA: Diagnosis present

## 2020-08-02 DIAGNOSIS — Z8249 Family history of ischemic heart disease and other diseases of the circulatory system: Secondary | ICD-10-CM | POA: Diagnosis not present

## 2020-08-02 DIAGNOSIS — Z20822 Contact with and (suspected) exposure to covid-19: Secondary | ICD-10-CM | POA: Diagnosis present

## 2020-08-02 DIAGNOSIS — M1990 Unspecified osteoarthritis, unspecified site: Secondary | ICD-10-CM | POA: Diagnosis present

## 2020-08-02 DIAGNOSIS — J9601 Acute respiratory failure with hypoxia: Secondary | ICD-10-CM | POA: Diagnosis not present

## 2020-08-02 DIAGNOSIS — I5031 Acute diastolic (congestive) heart failure: Secondary | ICD-10-CM | POA: Diagnosis not present

## 2020-08-02 DIAGNOSIS — M109 Gout, unspecified: Secondary | ICD-10-CM | POA: Diagnosis present

## 2020-08-02 DIAGNOSIS — Z87442 Personal history of urinary calculi: Secondary | ICD-10-CM | POA: Diagnosis not present

## 2020-08-02 DIAGNOSIS — Z833 Family history of diabetes mellitus: Secondary | ICD-10-CM

## 2020-08-02 DIAGNOSIS — I110 Hypertensive heart disease with heart failure: Secondary | ICD-10-CM | POA: Diagnosis present

## 2020-08-02 DIAGNOSIS — D500 Iron deficiency anemia secondary to blood loss (chronic): Secondary | ICD-10-CM | POA: Diagnosis present

## 2020-08-02 DIAGNOSIS — G4730 Sleep apnea, unspecified: Secondary | ICD-10-CM | POA: Diagnosis present

## 2020-08-02 DIAGNOSIS — R5381 Other malaise: Secondary | ICD-10-CM | POA: Diagnosis present

## 2020-08-02 DIAGNOSIS — E119 Type 2 diabetes mellitus without complications: Secondary | ICD-10-CM | POA: Diagnosis not present

## 2020-08-02 DIAGNOSIS — I5033 Acute on chronic diastolic (congestive) heart failure: Secondary | ICD-10-CM | POA: Diagnosis present

## 2020-08-02 DIAGNOSIS — J8410 Pulmonary fibrosis, unspecified: Secondary | ICD-10-CM | POA: Diagnosis present

## 2020-08-02 DIAGNOSIS — K5900 Constipation, unspecified: Secondary | ICD-10-CM | POA: Diagnosis not present

## 2020-08-02 DIAGNOSIS — E1169 Type 2 diabetes mellitus with other specified complication: Secondary | ICD-10-CM | POA: Diagnosis present

## 2020-08-02 DIAGNOSIS — I973 Postprocedural hypertension: Secondary | ICD-10-CM | POA: Diagnosis present

## 2020-08-02 DIAGNOSIS — Z79899 Other long term (current) drug therapy: Secondary | ICD-10-CM

## 2020-08-02 DIAGNOSIS — J9621 Acute and chronic respiratory failure with hypoxia: Secondary | ICD-10-CM | POA: Diagnosis present

## 2020-08-02 DIAGNOSIS — Z8719 Personal history of other diseases of the digestive system: Secondary | ICD-10-CM | POA: Diagnosis not present

## 2020-08-02 DIAGNOSIS — R0602 Shortness of breath: Secondary | ICD-10-CM | POA: Diagnosis not present

## 2020-08-02 DIAGNOSIS — Z7984 Long term (current) use of oral hypoglycemic drugs: Secondary | ICD-10-CM

## 2020-08-02 DIAGNOSIS — N1832 Chronic kidney disease, stage 3b: Secondary | ICD-10-CM | POA: Diagnosis present

## 2020-08-02 DIAGNOSIS — E785 Hyperlipidemia, unspecified: Secondary | ICD-10-CM | POA: Diagnosis present

## 2020-08-02 DIAGNOSIS — Z87891 Personal history of nicotine dependence: Secondary | ICD-10-CM

## 2020-08-02 HISTORY — PX: TRANSURETHRAL RESECTION OF BLADDER TUMOR WITH MITOMYCIN-C: SHX6459

## 2020-08-02 LAB — COMPREHENSIVE METABOLIC PANEL
ALT: 14 U/L (ref 0–44)
AST: 28 U/L (ref 15–41)
Albumin: 3.4 g/dL — ABNORMAL LOW (ref 3.5–5.0)
Alkaline Phosphatase: 64 U/L (ref 38–126)
Anion gap: 8 (ref 5–15)
BUN: 35 mg/dL — ABNORMAL HIGH (ref 8–23)
CO2: 24 mmol/L (ref 22–32)
Calcium: 9 mg/dL (ref 8.9–10.3)
Chloride: 107 mmol/L (ref 98–111)
Creatinine, Ser: 1.64 mg/dL — ABNORMAL HIGH (ref 0.61–1.24)
GFR, Estimated: 41 mL/min — ABNORMAL LOW (ref >60)
Glucose, Bld: 169 mg/dL — ABNORMAL HIGH (ref 70–99)
Potassium: 4.9 mmol/L (ref 3.5–5.1)
Sodium: 139 mmol/L (ref 135–145)
Total Bilirubin: 0.4 mg/dL (ref 0.3–1.2)
Total Protein: 7.2 g/dL (ref 6.5–8.1)

## 2020-08-02 LAB — CBC WITH DIFFERENTIAL/PLATELET
Abs Immature Granulocytes: 0.1 10*3/uL — ABNORMAL HIGH (ref 0.00–0.07)
Basophils Absolute: 0 10*3/uL (ref 0.0–0.1)
Basophils Relative: 0 %
Eosinophils Absolute: 0 10*3/uL (ref 0.0–0.5)
Eosinophils Relative: 0 %
HCT: 36.6 % — ABNORMAL LOW (ref 39.0–52.0)
Hemoglobin: 11.4 g/dL — ABNORMAL LOW (ref 13.0–17.0)
Immature Granulocytes: 1 %
Lymphocytes Relative: 8 %
Lymphs Abs: 0.6 10*3/uL — ABNORMAL LOW (ref 0.7–4.0)
MCH: 30.3 pg (ref 26.0–34.0)
MCHC: 31.1 g/dL (ref 30.0–36.0)
MCV: 97.3 fL (ref 80.0–100.0)
Monocytes Absolute: 0.1 10*3/uL (ref 0.1–1.0)
Monocytes Relative: 1 %
Neutro Abs: 6.5 10*3/uL (ref 1.7–7.7)
Neutrophils Relative %: 90 %
Platelets: 178 10*3/uL (ref 150–400)
RBC: 3.76 MIL/uL — ABNORMAL LOW (ref 4.22–5.81)
RDW: 14.6 % (ref 11.5–15.5)
WBC: 7.3 10*3/uL (ref 4.0–10.5)
nRBC: 0 % (ref 0.0–0.2)

## 2020-08-02 LAB — GLUCOSE, CAPILLARY
Glucose-Capillary: 112 mg/dL — ABNORMAL HIGH (ref 70–99)
Glucose-Capillary: 86 mg/dL (ref 70–99)
Glucose-Capillary: 155 mg/dL — ABNORMAL HIGH (ref 70–99)
Glucose-Capillary: 176 mg/dL — ABNORMAL HIGH (ref 70–99)

## 2020-08-02 LAB — TROPONIN I (HIGH SENSITIVITY): Troponin I (High Sensitivity): 24 ng/L — ABNORMAL HIGH (ref <18)

## 2020-08-02 SURGERY — TRANSURETHRAL RESECTION OF BLADDER TUMOR WITH MITOMYCIN-C
Anesthesia: Spinal

## 2020-08-02 MED ORDER — CEFAZOLIN SODIUM-DEXTROSE 2-4 GM/100ML-% IV SOLN
2.0000 g | INTRAVENOUS | Status: AC
  Administered 2020-08-02: 2 g via INTRAVENOUS
  Filled 2020-08-02: qty 100

## 2020-08-02 MED ORDER — HYDRALAZINE HCL 20 MG/ML IJ SOLN
10.0000 mg | Freq: Once | INTRAMUSCULAR | Status: AC
  Administered 2020-08-02: 10 mg via INTRAVENOUS

## 2020-08-02 MED ORDER — SODIUM CHLORIDE 0.9 % IR SOLN
Status: DC | PRN
  Administered 2020-08-02: 12000 mL

## 2020-08-02 MED ORDER — SENNA 8.6 MG PO TABS
1.0000 | ORAL_TABLET | Freq: Two times a day (BID) | ORAL | Status: DC
  Administered 2020-08-02 – 2020-08-08 (×11): 8.6 mg via ORAL
  Filled 2020-08-02 (×12): qty 1

## 2020-08-02 MED ORDER — HYDRALAZINE HCL 20 MG/ML IJ SOLN
INTRAMUSCULAR | Status: AC
  Filled 2020-08-02: qty 1

## 2020-08-02 MED ORDER — CHLORHEXIDINE GLUCONATE 0.12 % MT SOLN
15.0000 mL | Freq: Once | OROMUCOSAL | Status: AC
  Administered 2020-08-03: 15 mL via OROMUCOSAL
  Filled 2020-08-02: qty 15

## 2020-08-02 MED ORDER — CHLORHEXIDINE GLUCONATE CLOTH 2 % EX PADS
6.0000 | MEDICATED_PAD | Freq: Every day | CUTANEOUS | Status: DC
  Administered 2020-08-03 – 2020-08-05 (×3): 6 via TOPICAL

## 2020-08-02 MED ORDER — ONDANSETRON HCL 4 MG/2ML IJ SOLN
INTRAMUSCULAR | Status: AC
  Filled 2020-08-02: qty 2

## 2020-08-02 MED ORDER — POLYETHYLENE GLYCOL 3350 17 G PO PACK
17.0000 g | PACK | Freq: Every day | ORAL | Status: DC | PRN
  Administered 2020-08-05: 17 g via ORAL
  Filled 2020-08-02: qty 1

## 2020-08-02 MED ORDER — DOXAZOSIN MESYLATE 2 MG PO TABS
4.0000 mg | ORAL_TABLET | Freq: Every day | ORAL | Status: DC
  Administered 2020-08-02 – 2020-08-07 (×6): 4 mg via ORAL
  Filled 2020-08-02 (×6): qty 2

## 2020-08-02 MED ORDER — DOCUSATE SODIUM 100 MG PO CAPS
100.0000 mg | ORAL_CAPSULE | Freq: Two times a day (BID) | ORAL | Status: DC
  Administered 2020-08-03 – 2020-08-08 (×10): 100 mg via ORAL
  Filled 2020-08-02 (×11): qty 1

## 2020-08-02 MED ORDER — ONDANSETRON HCL 4 MG/2ML IJ SOLN
INTRAMUSCULAR | Status: DC | PRN
  Administered 2020-08-02: 4 mg via INTRAVENOUS

## 2020-08-02 MED ORDER — GUAIFENESIN ER 600 MG PO TB12
600.0000 mg | ORAL_TABLET | Freq: Two times a day (BID) | ORAL | Status: DC
  Administered 2020-08-02 – 2020-08-08 (×12): 600 mg via ORAL
  Filled 2020-08-02 (×12): qty 1

## 2020-08-02 MED ORDER — DEXAMETHASONE SODIUM PHOSPHATE 10 MG/ML IJ SOLN
INTRAMUSCULAR | Status: AC
  Filled 2020-08-02: qty 1

## 2020-08-02 MED ORDER — HYDROCODONE-ACETAMINOPHEN 5-325 MG PO TABS: 1.0000 | ORAL_TABLET | ORAL | Status: DC | PRN

## 2020-08-02 MED ORDER — GEMCITABINE CHEMO FOR BLADDER INSTILLATION 2000 MG: 2000.0000 mg | Freq: Once | INTRAVENOUS | Status: DC

## 2020-08-02 MED ORDER — STERILE WATER FOR IRRIGATION IR SOLN
Status: DC | PRN
  Administered 2020-08-02: 500 mL

## 2020-08-02 MED ORDER — SODIUM CHLORIDE 0.9 % IV SOLN: 250.0000 mL | INTRAVENOUS | Status: DC | PRN

## 2020-08-02 MED ORDER — ORAL CARE MOUTH RINSE: 15.0000 mL | Freq: Once | OROMUCOSAL | Status: AC

## 2020-08-02 MED ORDER — CARVEDILOL 3.125 MG PO TABS
3.1250 mg | ORAL_TABLET | Freq: Two times a day (BID) | ORAL | Status: DC
  Administered 2020-08-02 – 2020-08-08 (×12): 3.125 mg via ORAL
  Filled 2020-08-02 (×12): qty 1

## 2020-08-02 MED ORDER — ACETAMINOPHEN 325 MG PO TABS: 650.0000 mg | ORAL_TABLET | Freq: Four times a day (QID) | ORAL | Status: DC | PRN

## 2020-08-02 MED ORDER — PROPOFOL 500 MG/50ML IV EMUL
INTRAVENOUS | Status: DC | PRN
  Administered 2020-08-02: 25 ug/kg/min via INTRAVENOUS

## 2020-08-02 MED ORDER — SODIUM CHLORIDE 0.9% FLUSH
3.0000 mL | Freq: Two times a day (BID) | INTRAVENOUS | Status: DC
  Administered 2020-08-02 – 2020-08-08 (×12): 3 mL via INTRAVENOUS

## 2020-08-02 MED ORDER — MORPHINE SULFATE (PF) 2 MG/ML IV SOLN
2.0000 mg | INTRAVENOUS | Status: DC | PRN
  Administered 2020-08-03: 2 mg via INTRAVENOUS
  Filled 2020-08-02: qty 1

## 2020-08-02 MED ORDER — FENTANYL CITRATE (PF) 100 MCG/2ML IJ SOLN: 25.0000 ug | INTRAMUSCULAR | Status: DC | PRN

## 2020-08-02 MED ORDER — SODIUM CHLORIDE 0.9% FLUSH: 3.0000 mL | INTRAVENOUS | Status: DC | PRN

## 2020-08-02 MED ORDER — ACETAMINOPHEN 650 MG RE SUPP: 650.0000 mg | Freq: Four times a day (QID) | RECTAL | Status: DC | PRN

## 2020-08-02 MED ORDER — DEXAMETHASONE SODIUM PHOSPHATE 10 MG/ML IJ SOLN
INTRAMUSCULAR | Status: DC | PRN
  Administered 2020-08-02: 4 mg via INTRAVENOUS

## 2020-08-02 MED ORDER — INSULIN ASPART 100 UNIT/ML IJ SOLN
0.0000 [IU] | INTRAMUSCULAR | Status: DC
  Administered 2020-08-02: 2 [IU] via SUBCUTANEOUS
  Administered 2020-08-03: 1 [IU] via SUBCUTANEOUS
  Administered 2020-08-03: 2 [IU] via SUBCUTANEOUS
  Administered 2020-08-03 – 2020-08-04 (×4): 1 [IU] via SUBCUTANEOUS
  Administered 2020-08-04: 2 [IU] via SUBCUTANEOUS

## 2020-08-02 MED ORDER — FERROUS SULFATE 325 (65 FE) MG PO TABS
325.0000 mg | ORAL_TABLET | Freq: Every day | ORAL | Status: DC
  Administered 2020-08-03 – 2020-08-08 (×6): 325 mg via ORAL
  Filled 2020-08-02 (×6): qty 1

## 2020-08-02 MED ORDER — CEPHALEXIN 500 MG PO CAPS: 500.0000 mg | ORAL_CAPSULE | Freq: Two times a day (BID) | ORAL | 0 refills | Status: DC

## 2020-08-02 MED ORDER — PHENYLEPHRINE HCL-NACL 20-0.9 MG/250ML-% IV SOLN
INTRAVENOUS | Status: DC | PRN
  Administered 2020-08-02: 20 ug/min via INTRAVENOUS

## 2020-08-02 MED ORDER — LACTATED RINGERS IV SOLN: INTRAVENOUS | Status: DC

## 2020-08-02 MED ORDER — ONDANSETRON HCL 4 MG/2ML IJ SOLN: 4.0000 mg | Freq: Once | INTRAMUSCULAR | Status: DC | PRN

## 2020-08-02 MED ORDER — BUPIVACAINE IN DEXTROSE 0.75-8.25 % IT SOLN
INTRATHECAL | Status: DC | PRN
  Administered 2020-08-02: 1.7 mL via INTRATHECAL

## 2020-08-02 MED ORDER — PROPOFOL 1000 MG/100ML IV EMUL
INTRAVENOUS | Status: AC
  Filled 2020-08-02: qty 100

## 2020-08-02 SURGICAL SUPPLY — 23 items
BAG URINE DRAIN 2000ML AR STRL (UROLOGICAL SUPPLIES) IMPLANT
BAG URO CATCHER STRL LF (MISCELLANEOUS) ×2 IMPLANT
CATH FOLEY 2WAY SLVR  5CC 20FR (CATHETERS) ×2
CATH FOLEY 2WAY SLVR 30CC 24FR (CATHETERS) IMPLANT
CATH FOLEY 2WAY SLVR 5CC 20FR (CATHETERS) ×1 IMPLANT
DRAPE FOOT SWITCH (DRAPES) ×2 IMPLANT
ELECT REM PT RETURN 15FT ADLT (MISCELLANEOUS) IMPLANT
EVACUATOR MICROVAS BLADDER (UROLOGICAL SUPPLIES) IMPLANT
GLOVE BIOGEL M 8.0 STRL (GLOVE) ×2 IMPLANT
GOWN STRL REUS W/TWL XL LVL3 (GOWN DISPOSABLE) ×2 IMPLANT
KIT TURNOVER KIT A (KITS) ×2 IMPLANT
LOOP CUT BIPOLAR 24F LRG (ELECTROSURGICAL) ×2 IMPLANT
LOOP MONOPOLAR YLW (ELECTROSURGICAL) IMPLANT
MANIFOLD NEPTUNE II (INSTRUMENTS) ×2 IMPLANT
NDL SAFETY ECLIPSE 18X1.5 (NEEDLE) ×1 IMPLANT
NEEDLE HYPO 18GX1.5 SHARP (NEEDLE) ×2
PACK CYSTO (CUSTOM PROCEDURE TRAY) ×2 IMPLANT
PENCIL SMOKE EVACUATOR (MISCELLANEOUS) IMPLANT
SYR TOOMEY IRRIG 70ML (MISCELLANEOUS) ×2
SYRINGE TOOMEY IRRIG 70ML (MISCELLANEOUS) ×1 IMPLANT
TUBING CONNECTING 10 (TUBING) ×2 IMPLANT
TUBING UROLOGY SET (TUBING) ×2 IMPLANT
WATER STERILE IRR 3000ML UROMA (IV SOLUTION) ×2 IMPLANT

## 2020-08-02 NOTE — Transfer of Care (Signed)
Immediate Anesthesia Transfer of Care Note  Patient: Bradley Johns  Procedure(s) Performed: TRANSURETHRAL RESECTION OF BLADDER TUMOR (N/A )  Patient Location: PACU  Anesthesia Type:MAC and Spinal  Level of Consciousness: awake, alert , oriented and patient cooperative  Airway & Oxygen Therapy: Patient Spontanous Breathing and Patient connected to face mask oxygen  Post-op Assessment: Report given to RN and Post -op Vital signs reviewed and stable  Post vital signs: Reviewed and stable  Last Vitals:  Vitals Value Taken Time  BP 126/55 08/02/20 1420  Temp    Pulse 55 08/02/20 1421  Resp 14 08/02/20 1421  SpO2 90 % 08/02/20 1421  Vitals shown include unvalidated device data.  Last Pain:  Vitals:   08/02/20 1132  TempSrc:   PainSc: 0-No pain         Complications: No complications documented.

## 2020-08-02 NOTE — H&P (Signed)
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  We have scheduled him to undergo TURBT plus probable instillation of intravesical gemcitabine.  I discussed the procedure with the patient, risks, complications, expected outcomes.  He understands and desires to proceed.  Past Medical History:  Diagnosis Date  . Anemia    iron  . Arthritis   . Benign prostatic hypertrophy   . COPD (chronic obstructive pulmonary disease) ([place])    24/7 O2  . Diabetes mellitus    per Dr Posey Pronto  . Epidermal cyst of face    left lower lateral cheek  . Gout   . History of colon polyps   . History of kidney stones 1980s  . Hyperlipidemia   . Hypertension   . Kidney disease   . Pulmonary fibrosis ([place]) 2014  . Sleep apnea   . 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 COLONOSCOPY WITH PROPOFOL N/A 05/18/2018   Procedure: COLONOSCOPY WITH PROPOFOL;  Surgeon: Carol Ada, MD;  Location: [facility] END[facility]COPY;  Service: Endoscopy;  Laterality: N/A;  . POLYPECTOMY  05/18/2018   Procedure: POLYPECTOMY;  Surgeon: Carol Ada, MD;  Location: [facility] ENDOSCOPY;  Service: Endoscopy;;  . TOE SURGERY      Home Medications:    Allergies: No Known Allergies  Family History  Problem Relation Age of Onset  . Liver disease Mother   . Coronary artery disease Father 90  . Diabetes Sister   . Cancer Brother        type?  Marland Kitchen Hypertension Neg Hx   . Hyperlipidemia Neg Hx   . Heart attack Neg Hx   . Prostate cancer Neg Hx   . Colon cancer Neg Hx     Social History:  reports that he quit smoking about 24 years ago. His smoking use included cigarettes. He has a 60.00 pack-year smoking history. He has never used smokeless tobacco. He reports current alcohol use. He reports that he does not use drugs.  ROS: A  complete review of systems was performed.  All systems are negative except for pertinent findings as noted.  Physical Exam:  Vital signs in last 24 hours: BP (P) 121/61   Pulse (P) 71   Temp (!) (P) 97.5 F (36.4 C) (Oral)   Resp (P) 20   Ht 5\' 8"  (1.727 m)   Wt 81.4 kg   SpO2 (P) 90%   BMI 27.29 kg/m  Constitutional:  Alert and oriented, No acute distress Cardiovascular: Regular rate  Respiratory: Normal respiratory effort GI: Abdomen is soft, nontender, nondistended, no abdominal masses. No CVAT.  Genitourinary: Normal male phallus, testes are descended bilaterally and non-tender and without masses, scrotum is normal in appearance without lesions or masses, perineum is normal on inspection. Lymphatic: No lymphadenopathy Neurologic: Grossly intact, no focal deficits Psychiatric: Normal mood and affect  Laboratory Data:  No results for input(s): WBC, HGB, HCT, PLT in the last 72 hours.  No results for input(s): NA, K, CL, GLUCOSE, BUN, CALCIUM, CREATININE in the last 72 hours.  Invalid input(s): CO3   Results for orders placed or performed during the hospital encounter of 08/02/20 (from the past 24 hour(s))  Glucose, capillary     Status: Abnormal   Collection Time: 08/02/20 11:00 AM  Result Value Ref Range   Glucose-Capillary 112 (H) 70 -  99 mg/dL   Recent Results (from the past 240 hour(s))  SARS CORONAVIRUS 2 (TAT 6-24 HRS) Nasopharyngeal Nasopharyngeal Swab     Status: None   Collection Time: 07/31/20  3:16 PM   Specimen: Nasopharyngeal Swab  Result Value Ref Range Status   SARS Coronavirus 2 NEGATIVE NEGATIVE Final    Comment: (NOTE) SARS-CoV-2 target nucleic acids are NOT DETECTED.  The SARS-CoV-2 RNA is generally detectable in upper and lower respiratory specimens during the acute phase of infection. Negative results do not preclude SARS-CoV-2 infection, do not rule out co-infections with other pathogens, and should not be used as the sole basis for treatment  or other patient management decisions. Negative results must be combined with clinical observations, patient history, and epidemiological information. The expected result is Negative.  Fact Sheet for Patients: SugarRoll.be  Fact Sheet for Healthcare Providers: https://www.woods-mathews.com/  This test is not yet approved or cleared by the [place] FDA and  has been authorized for detection and/or diagnosis of SARS-CoV-2 by FDA under an Emergency Use Authorization (EUA). This EUA will remain  in effect (meaning this test can be used) for the duration of the COVID-19 declaration under Se ction 564(b)(1) of the Act, 21 U.S.C. section 360bbb-3(b)(1), unless the authorization is terminated or revoked sooner.  Performed at [facility], [place] [address] [place], [place] [address]     Renal Function: Recent Labs    07/26/20 1447  CREATININE 2.11*   Estimated Creatinine Clearance: 25.7 mL/min (A) (by C-G formula based on SCr of 2.11 mg/dL (H)).  Radiologic Imaging: No results found.  Impression/Assessment:  Bladder cancer  Plan:  Cystoscopy, TURBT, placement of intravesical gemcitabine

## 2020-08-02 NOTE — Discharge Instructions (Signed)
1. You may see some blood in the urine and may have some burning with urination for 48-72 hours. You also may notice that you have to urinate more frequently or urgently after your procedure which is normal.  2. You should call should you develop an inability urinate, fever > 101, persistent nausea and vomiting that prevents you from eating or drinking to stay hydrated. 3. If you have a catheter, you will be taught how to take care of the catheter by the nursing staff prior to discharge from the hospital.  You may periodically feel a strong urge to void with the catheter in place.  This is a bladder spasm and most often can occur when having a bowel movement or moving around. It is typically self-limited and usually will stop after a few minutes.  You may use some Vaseline or Neosporin around the tip of the catheter to reduce friction at the tip of the penis. You may also see some blood in the urine.  A very small amount of blood can make the urine look quite red.  As long as the catheter is draining well, there usually is not a problem.  However, if the catheter is not draining well and is bloody, you should call the office [phone]) to notify us.  If the urine is clear by Saturday morning, it is okay to remove it as instructed by the nurses.

## 2020-08-02 NOTE — H&P (Signed)
Bradley Johns [id number] DOB: 1934-05-13 DOA: 08/02/2020    PCP: Colon Branch, MD   Outpatient Specialists:      Pulmonary   Dr. Eyvonne Mechanic   GI Dr. Bryan Lemma  [facility]) Urology Dr. Diona Fanti  Patient arrived to ER on  at  Referred by Attending Toy Baker, MD   Patient coming from: home Lives   With family    Chief Complaint:  elevated BP HPI: Bradley Johns is a 86 y.o. male with medical history significant of HTN, bladder cancer, symptomatic anemia, pulmonary fibrosis, COPD, chronic respiratory failure with hypoxia on 3 L, gout, HLD HTN, DM2, CKD stage III, history of PEA arrest    Presented with patient undergone bladder mass biopsy tonight by urology postoperatively noted to have somewhat elevated blood pressures in the 190s patient at baseline blood pressure 150s 160s.  He is due to get his blood pressure medications tonight.  Is also noted patient was hypoxic down to mid 80s on his 3 to 4 L.  O2 was increased up to 5 L and he was able to sat in the low to mid 90s.  Patient states he has been having progressively worse dyspnea on exertion and chronic per family he has history of COPD and pulmonary fibrosis which she is on allergy.  They found that inhalers are not helping him in any way but he does need to be on chronic oxygen.  In the past he have had a PEA arrest which felt to be secondary to severe anemia he has been taking iron supplements but not consistently.    Infectious risk factors:  Reports shortness of breath,     Has  been vaccinated against COVID    Initial COVID TEST  NEGATIVE   Lab Results  Component Value Date   [place] NEGATIVE 07/31/2020   [place] NEGATIVE 04/13/2020   [place] NEGATIVE 03/31/2020   Regarding pertinent Chronic problems:    Hyperlipidemia -no longer takes Lipitor Lipid Panel     Component Value Date/Time   CHOL 135 03/24/2018 1149   TRIG 119.0 03/24/2018 1149   TRIG 122 03/03/2006 1011   HDL 46.30  03/24/2018 1149   CHOLHDL 3 03/24/2018 1149   VLDL 23.8 03/24/2018 1149   LDLCALC 65 03/24/2018 1149   LDLDIRECT 117.2 09/17/2011 0948     HTN on Coreg Cardura     DM 2 -  Lab Results  Component Value Date   HGBA1C 6.4 (H) 07/26/2020   on PO meds only,      COPD pulmonary fibrosis- followed by pulmonology   on baseline oxygen  3L,      CKD stage IIIb- baseline Cr 2.0 Estimated Creatinine Clearance: 33 mL/min (A) (by C-G formula based on SCr of 1.64 mg/dL (H)).  Lab Results  Component Value Date   CREATININE 2.11 (H) 07/26/2020   CREATININE 1.90 (H) 05/31/2020   CREATININE 1.58 (H) 05/07/2020      Chronic anemia - baseline hg Hemoglobin & Hematocrit  Recent Labs    05/07/20 1156 05/31/20 0924 07/26/20 1447  HGB 10.4* 10.6* 11.4*       ED Triage Vitals  Enc Vitals Group     BP 08/02/20 1054 (P) 121/61     Pulse Rate 08/02/20 1054 (P) 71     Resp 08/02/20 1054 (P) 20     Temp 08/02/20 1054 (!) (P) 97.5 F (36.4 C)     Temp Source 08/02/20 1054 (P) Oral  SpO2 08/02/20 1054 (P) 90 %     Weight 08/02/20 1041 179 lb 7.3 oz (81.4 kg)     Height 08/02/20 1041 5\' 8"  (1.727 m)     Head Circumference --      Peak Flow --      Pain Score 08/02/20 1132 0     Pain Loc --      Pain Edu? --      Excl. in [place]? --   TMAX(24)@     _________________________________________ Significant initial  Findings: Abnormal Labs Reviewed  GLUCOSE, CAPILLARY - Abnormal; Notable for the following components:      Result Value   Glucose-Capillary 112 (*)    All other components within normal limits  GLUCOSE, CAPILLARY - Abnormal; Notable for the following components:   Glucose-Capillary 155 (*)    All other components within normal limits  COMPREHENSIVE METABOLIC PANEL - Abnormal; Notable for the following components:   Glucose, Bld 169 (*)    BUN 35 (*)    Creatinine, Ser 1.64 (*)    Albumin 3.4 (*)    GFR, Estimated 41 (*)    All other components within normal limits  CBC WITH  DIFFERENTIAL/PLATELET - Abnormal; Notable for the following components:   RBC 3.76 (*)    Hemoglobin 11.4 (*)    HCT 36.6 (*)    Lymphs Abs 0.6 (*)    Abs Immature Granulocytes 0.10 (*)    All other components within normal limits  GLUCOSE, CAPILLARY - Abnormal; Notable for the following components:   Glucose-Capillary 176 (*)    All other components within normal limits  GLUCOSE, CAPILLARY - Abnormal; Notable for the following components:   Glucose-Capillary 150 (*)    All other components within normal limits  TROPONIN I (HIGH SENSITIVITY) - Abnormal; Notable for the following components:   Troponin I (High Sensitivity) 24 (*)    All other components within normal limits   ____________________________________________ Ordered   CXR -   NON acute    _________________________ Troponin 24 down from baseline ECG: Ordered Personally reviewed by me showing: HR : 70's Rhythm: NSR,    no evidence of ischemic changes   The recent clinical data is shown below. Vitals:   08/02/20 1819 08/02/20 1820 08/02/20 1832 08/02/20 1851  BP: (!) 199/72 (!) 199/72 (!) 194/79 (!) 196/87  Pulse:    83  Resp:    16  Temp:      TempSrc:      SpO2:      Weight:      Height:         WBC     Component Value Date/Time   WBC 7.1 07/26/2020 1447   LYMPHSABS 0.8 05/31/2020 0924   MONOABS 0.6 05/31/2020 0924   EOSABS 0.1 05/31/2020 0924   BASOSABS 0.0 05/31/2020 [id number]        Results for orders placed or performed during the hospital encounter of 07/31/20  SARS CORONAVIRUS 2 (TAT 6-24 HRS) Nasopharyngeal Nasopharyngeal Swab     Status: None   Collection Time: 07/31/20  3:16 PM   Specimen: Nasopharyngeal Swab  Result Value Ref Range Status   SARS Coronavirus 2 NEGATIVE NEGATIVE Final          _______________________________________________ Hospitalist was called for admission for  Accelerated HTN   Following Medications were ordered in ER: Medications  lactated ringers infusion (  Intravenous Anesthesia Volume Adjustment 08/02/20 1422)  chlorhexidine (PERIDEX) 0.12 % solution 15 mL (has no administration in  time range)    Or  MEDLINE mouth rinse (has no administration in time range)  fentaNYL (SUBLIMAZE) injection 25-50 mcg (has no administration in time range)  ondansetron (ZOFRAN) injection 4 mg (has no administration in time range)  sodium chloride irrigation 0.9 % (12,000 mLs Irrigation Given 08/02/20 1345)  sterile water for irrigation for irrigation (500 mLs  Given 08/02/20 1354)  hydrALAZINE (APRESOLINE) 20 MG/ML injection (has no administration in time range)  insulin aspart (novoLOG) injection 0-9 Units (has no administration in time range)  ceFAZolin (ANCEF) IVPB 2g/100 mL premix (2 g Intravenous Given 08/02/20 1310)  hydrALAZINE (APRESOLINE) injection 10 mg (10 mg Intravenous Given 08/02/20 1802)    OTHER Significant initial  Findings:  labs showing:    Recent Labs  Lab 08/02/20 2126  NA 139  K 4.9  CO2 24  GLUCOSE 169*  BUN 35*  CREATININE 1.64*  CALCIUM 9.0    Cr   stable,   Lab Results  Component Value Date   CREATININE 1.64 (H) 08/02/2020   CREATININE 2.11 (H) 07/26/2020   CREATININE 1.90 (H) 05/31/2020    Recent Labs  Lab 08/02/20 2126  AST 28  ALT 14  ALKPHOS 64  BILITOT 0.4  PROT 7.2  ALBUMIN 3.4*   Lab Results  Component Value Date   CALCIUM 9.0 08/02/2020   PHOS 4.5 04/07/2020       Plt: Lab Results  Component Value Date   PLT 195 07/26/2020         Recent Labs  Lab 08/02/20 2126  WBC 7.3  NEUTROABS 6.5  HGB 11.4*  HCT 36.6*  MCV 97.3  PLT 178    HG/HCT   stable,       Component Value Date/Time   HGB 11.4 (L) 08/02/2020 2126   HCT 36.6 (L) 08/02/2020 2126   MCV 97.3 08/02/2020 2126      BNP (last 3 results) Recent Labs    04/04/20 0047 04/05/20 0238 04/06/20 0146  BNP 756.9* 224.9* 86.4      DM  labs:  HbA1C: Recent Labs    03/31/20 1112 07/26/20 1447  HGBA1C 5.7* 6.4*       CBG  (last 3)  Recent Labs    08/02/20 1829 08/02/20 2046 08/03/20 0021  GLUCAP 155* 176* 150*         Cultures:    Component Value Date/Time   SDES URINE, RANDOM 04/15/2020 1938   SPECREQUEST NONE 04/15/2020 1938   CULT (A) 04/15/2020 1938    <10,000 COLONIES/mL INSIGNIFICANT GROWTH Performed at [facility], [address] [place], [place] [address]    REPTSTATUS 04/17/2020 FINAL 04/15/2020 1938     Radiological Exams on Admission: No results found. _______________________________________________________________________________________________________ Latest  Blood pressure (!) 196/87, pulse 83, temperature (!) 97.5 F (36.4 C), resp. rate 16, height 5\' 8"  (1.727 m), weight 81.4 kg, SpO2 96 %.   Review of Systems:    Pertinent positives include:  fatigue, dyspnea on exertion,  Constitutional:  No weight loss, night sweats, Fevers, chills,  weight loss  HEENT:  No headaches, Difficulty swallowing,Tooth/dental problems,Sore throat,  No sneezing, itching, ear ache, nasal congestion, post nasal drip,  Cardio-vascular:  No chest pain, Orthopnea, PND, anasarca, dizziness, palpitations.no Bilateral lower extremity swelling  GI:  No heartburn, indigestion, abdominal pain, nausea, vomiting, diarrhea, change in bowel habits, loss of appetite, melena, blood in stool, hematemesis Resp:  no shortness of breath at rest. No No excess mucus, no productive cough, No  non-productive cough, No coughing up of blood.No change in color of mucus.No wheezing. Skin:  no rash or lesions. No jaundice GU:  no dysuria, change in color of urine, no urgency or frequency. No straining to urinate.  No flank pain.  Musculoskeletal:  No joint pain or no joint swelling. No decreased range of motion. No back pain.  Psych:  No change in mood or affect. No depression or anxiety. No memory loss.  Neuro: no localizing neurological complaints, no tingling, no weakness, no double vision, no gait  abnormality, no slurred speech, no confusion  All systems reviewed and apart from [place] all are negative _______________________________________________________________________________________________ Past Medical History:   Past Medical History:  Diagnosis Date  . Anemia    iron  . Arthritis   . Benign prostatic hypertrophy   . COPD (chronic obstructive pulmonary disease) ([place])    24/7 O2  . Diabetes mellitus    per Dr Posey Pronto  . Epidermal cyst of face    left lower lateral cheek  . Gout   . History of colon polyps   . History of kidney stones 1980s  . Hyperlipidemia   . Hypertension   . Kidney disease   . Pulmonary fibrosis ([place]) 2014  . Sleep apnea   . Urothelial carcinoma ([place]) 05/24/2020      Past Surgical History:  Procedure Laterality Date  . COLONOSCOPY WITH PROPOFOL N/A 05/18/2018   Procedure: COLONOSCOPY WITH PROPOFOL;  Surgeon: Carol Ada, MD;  Location: [facility] ENDOSCOPY;  Service: Endoscopy;  Laterality: N/A;  . POLYPECTOMY  05/18/2018   Procedure: POLYPECTOMY;  Surgeon: Carol Ada, MD;  Location: [facility] ENDOSCOPY;  Service: Endoscopy;;  . TOE SURGERY      Social History:  Ambulatory    independently       reports that he quit smoking about 24 years ago. His smoking use included cigarettes. He has a 60.00 pack-year smoking history. He has never used smokeless tobacco. He reports current alcohol use. He reports that he does not use drugs.     Family History:   Family History  Problem Relation Age of Onset  . Liver disease Mother   . Coronary artery disease Father 74  . Diabetes Sister   . Cancer Brother        type?  Marland Kitchen Hypertension Neg Hx   . Hyperlipidemia Neg Hx   . Heart attack Neg Hx   . Prostate cancer Neg Hx   . Colon cancer Neg Hx    ______________________________________________________________________________________________ Allergies: No Known Allergies   Prior to Admission medications   Medication Sig Start Date End Date Taking?  Authorizing Provider  atorvastatin (LIPITOR) 80 MG tablet Take 1 tablet (80 mg total) by mouth daily. Patient taking differently: Take 80 mg by mouth every evening. 04/15/17  Yes Colon Branch, MD  carvedilol (COREG) 3.125 MG tablet Take 1 tablet (3.125 mg total) by mouth 2 (two) times daily with a meal. 04/13/20  Yes [place] Eric J, DO  cephALEXin (KEFLEX) 500 MG capsule Take 1 capsule (500 mg total) by mouth 2 (two) times daily for 6 doses. 08/02/20 08/05/20 Yes Dahlstedt, Annie Main, MD  doxazosin (CARDURA) 4 MG tablet Take 1 tablet (4 mg total) by mouth at bedtime. 03/01/19  Yes Paz, Alda Berthold, MD  Multiple Vitamin (MULTIVITAMIN) tablet Take 1 tablet by mouth daily.   Yes [provider]  OXYGEN Inhale 3 L into the lungs continuous.   Yes [provider]  pioglitazone (ACTOS) 45 MG tablet Take 45  mg by mouth daily.   Yes [provider]  saline (AYR) GEL Place 1 application into both nostrils every 12 (twelve) hours. 04/13/20  Yes [place] Eric J, DO  sodium chloride (OCEAN) 0.65 % SOLN nasal spray Place 1 spray into both nostrils as needed for congestion. 04/13/20  Yes [place] Eric J, DO  colchicine 0.6 MG tablet Take 1 tablet (0.6 mg total) by mouth 2 (two) times daily as needed. 0000000   Delora Fuel, MD  ferrous sulfate 325 (65 FE) MG tablet Take 1 tablet (325 mg total) by mouth daily with breakfast. 04/14/20   [place] Donnamarie Poag, DO  ketoconazole (NIZORAL) 2 % cream Apply 1 application topically daily. Patient taking differently: Apply 1 application topically daily as needed (moisture around groin). 03/24/18   Colon Branch, MD    ___________________________________________________________________________________________________ Physical Exam: Vitals with BMI 08/02/2020 08/02/2020 08/02/2020  Height - - -  Weight - - -  BMI - - -  Systolic [id number]  Diastolic 87 79 72  Pulse 83 - -    1. General:  in No  Acute distress    Chronically ill  -appearing 2. Psychological: Alert  and Oriented 3. Head/ENT:   Moist  Mucous Membranes                          Head Non traumatic, neck supple                        Poor Dentition 4. SKIN:  decreased Skin turgor,  Skin clean Dry and intact no rash 5. Heart: Regular rate and rhythm no  Murmur, no Rub or gallop 6. Lungs: no wheezes fine crackles   7. Abdomen: Soft, non-tender, Non distended owel sounds present 8. Lower extremities: no clubbing, cyanosis, trace edema 9. Neurologically Grossly intact, moving all 4 extremities equally  10. MSK: Normal range of motion    Chart has been reviewed  ______________________________________________________________________________________________  Assessment/Plan  86 y.o. male with medical history significant of HTN, bladder cancer, symptomatic anemia, pulmonary fibrosis, COPD, chronic respiratory failure with hypoxia on 3 L, gout, HLD HTN, DM2, CKD stage III, history of PEA arrest    Admitted for accelerated HTn and acute on chronic resp failure with hypoxia  Present on Admission: . Accelerated hypertension patient received a dose of hydralazine with some improvement resume home medications and continue to observe Avoid over aggressive blood pressure drop Treat pain as needed as this can contribute to hypertension  . Urothelial carcinoma of bladder ([place]) -as per urology  . Pulmonary fibrosis ([place]) -continue oxygen.  Patient is now requiring up to 5 L he is unclear if it is a change because he has not been checking his oxygen at home.  He has been dyspneic.  Continue oxygen and titrate as needed needs to follow-up with pulmonary  . Hyperlipidemia -chronic currently not on statin defer to PCP  . COPD with emphysema, on O2 -currently no evidence of exacerbation we will continue to monitor family states that he has not been on inhalers because they have not been helping him.  No wheezing currently  Patient stopped smoking . Acute on chronic respiratory failure with hypoxia  ([place]) . Iron deficiency anemia due to chronic blood loss  DM2 -  - Order Sensitive  SSI   -  check TSH and HgA1C  - Hold by mouth medications  History of anemia stable  Other plan  as per orders.  DVT prophylaxis:     SCD's Start: 08/02/20 1034    Code Status:    Code Status: Prior FULL CODE  as per patient   I had personally discussed CODE STATUS with patient     Family Communication:   Family   at  Bedside  plan of care was discussed   With  Daughter   Disposition Plan:      To home once workup is complete and patient is stable   Following barriers for discharge:                                                     Anemia stable                             Pain controlled with PO medications                                                            Will need to be able to tolerate PO                                               Will need consultants to evaluate patient prior to discharge                   Would benefit from PT/OT eval prior to [place] called: Urology is aware of pt   Admission status: Obs    Level of care   Progressive  tele indefinitely please discontinue once patient no longer qualifies COVID-19 Labs    Lab Results  Component Value Date   [place] 07/31/2020     Precautions: admitted as  Covid Negative      PPE: Used by the provider:   N95  eye Goggles,  Gloves     Allah Reason 08/02/2020, 8:44 PM    [facility]     after 2 AM please page floor coverage PA If 7AM-7PM, please contact the day team taking care of the patient using [email or web address]   Patient was evaluated in the context of the global COVID-19 pandemic, which necessitated consideration that the patient might be at risk for infection with the SARS-CoV-2 virus that causes COVID-19. Institutional protocols and algorithms that pertain to the evaluation of patients at risk for COVID-19 are in a state of rapid  change based on information released by regulatory bodies including the CDC and federal and state organizations. These policies and algorithms were followed during the patient's care.

## 2020-08-02 NOTE — Progress Notes (Signed)
Notified pt's daughter, Ashyr Hedgepath, that surgery is running late and will be closer to 1 PM. Notified pt that surgery will be closer to 1 PM.

## 2020-08-02 NOTE — Progress Notes (Signed)
Pt with systolics in 657Q and 469G. No CP or SOB. No HA.   Pt reports he is only on coreg and took it this AM.   Looks well, NAD Abd - soft , NT  Nurses were concerned about red urine. I rrigated the foley with equal return and it went from red to pink quickly. No significant bleeding currently, but as expected after a TURBT.   I spoke with hospitalist and really appreciate their willingness to admit pt for overnight observation on tele given all his medical history. He was supposed to go home after TURBT.   I will notify Dr. Diona Fanti of admission.

## 2020-08-02 NOTE — Op Note (Signed)
Preoperative diagnosis: Urothelial carcinoma the bladder, 3 to 4 cm in maximum diameter  Postoperative diagnosis: Urothelial carcinoma the bladder, greater than 5 cm in maximum diameter  Principal procedure: Cystoscopy, transurethral resection of bladder tumor  Surgeon: Manilla Strieter  Anesthesia: Spinal  Estimated blood loss: Less than 25 mL  Specimen: Bladder tumor fragments, to pathology  Drains: 45 French Foley catheter, to bedside bag  Complications: None  Indications: 86 year old male with COPD and significant respiratory compromise.  He was recently found to have right sided bladder wall/trigonal bladder tumors.  He presents at this time for resection of these as well as possible gemcitabine instillation.  I discussed the procedure with the patient, expected risks, complications and outcomes.  We did talk about the risk of infection, bladder perforation, side effects from the gemcitabine i.e. dysuria, frequency and urgency, anesthetic complications among others.  He understands these and desires to proceed.  Findings: Urethra was normal.  Prostate was minimally obstructive with bilobar hypertrophy.  There was a carpeting of low-lying urothelial carcinoma on the right bladder wall, extending medially and inferiorly to the right bladder neck at the 7 o'clock position, the intra trigonal ridge and 1 area on the left bladder wall, laterally.  All in all, I would say the tumor burden which was mostly contiguous took up one fourth of the bladder surface.  The largest diameter of the tumor was over 5 cm in size.  The left ureteral orifice was normally positioned and configured.  I never saw the right ureteral orifice due to overlying tumor.  Description of procedure: Patient was properly identified in the holding area.  He received preoperative IV antibiotics.  He was taken to the operating room where subarachnoid block was administered.  Once effective, he was placed in the dorsolithotomy  position.  Genitalia and perineum were prepped, draped, proper timeout performed.  35 French panendoscope was advanced under direct vision into the patient's bladder with the above-mentioned findings.  Once inspection was carried out, the resectoscope sheath was placed with the visual obturator.  The 30 degree lens and cutting loop/resectoscope were then placed.  Resection was first carried out on the left bladder wall tumor, resected into the wall of the bladder.  The base of resection was cauterized.  The large proportion of the right sided/posterior/trigonal tumor was very low lying, not papillary.  Because of the large volume of this, I used the cautery current to just rake along the surface of the bladder, cauterizing and removing the abnormal appearing tumor.  In this manner, most of the areas showed normal urothelium after I went by and cauterize/remove this tumor and that manner.  The larger papillary lesions were resected with the loop down to the muscular layer.  The area of resection was perhaps 5-1/2 cm in size.  The surrounding carpeted area was also cauterized, in addition to the base of the resected area.  There were quite a few places where I felt that the resection was carried down and thinned out the bladder wall, and I did not feel it was judicious to use the gemcitabine postoperatively.  After all of the tumor was resected, the bladder tumor base was cauterized and the surrounding/abnormal/carpeted areas were cauterized, there was adequate hemostasis.  All bladder tumor fragments were irrigated from the bladder at this point.  They were sent for permanent pathology.  As there was hemostasis, the scope was removed, a 20 [place] Foley catheter was placed.  This was then filled with 10 cc of water  to fill the balloon, it was hooked to dependent drainage and the procedure was terminated.  The patient was then taken to the PACU in stable condition.  He tolerated the procedure well.

## 2020-08-02 NOTE — Anesthesia Procedure Notes (Signed)
Spinal  Patient location during procedure: OR Start time: 08/02/2020 1:05 PM End time: 08/02/2020 1:09 PM Reason for block: surgical anesthesia Staffing Performed: anesthesiologist  Anesthesiologist: Josephine Igo, MD Preanesthetic Checklist Completed: patient identified, IV checked, site marked, risks and benefits discussed, surgical consent, monitors and equipment checked, pre-op evaluation and timeout performed Spinal Block Patient position: sitting Prep: DuraPrep and site prepped and draped Patient monitoring: heart rate, cardiac monitor, continuous pulse ox and blood pressure Approach: midline Location: L3-4 Injection technique: single-shot Needle Needle type: Pencan  Needle gauge: 24 G Needle length: 9 cm Needle insertion depth: 6 cm Assessment Sensory level: T4 Events: CSF return Additional Notes Patient tolerated procedure well. Adequate sensory level.

## 2020-08-02 NOTE — Progress Notes (Addendum)
Arcelia Jew, RN, paged and spoke with Dr. Junious Silk regarding patient's elevated BP, urine color and clots in Foley. Per MD, irrigate Foley now and MD en route.  Dr. Junious Silk at bedside to evaluate pt. Per MD, will admit overnight for observation.

## 2020-08-03 ENCOUNTER — Encounter (HOSPITAL_COMMUNITY): Payer: Self-pay | Admitting: Urology

## 2020-08-03 DIAGNOSIS — J9601 Acute respiratory failure with hypoxia: Secondary | ICD-10-CM | POA: Diagnosis not present

## 2020-08-03 LAB — CBC WITH DIFFERENTIAL/PLATELET
Abs Immature Granulocytes: 0.05 10*3/uL (ref 0.00–0.07)
Basophils Absolute: 0 10*3/uL (ref 0.0–0.1)
Basophils Relative: 0 %
Eosinophils Absolute: 0 10*3/uL (ref 0.0–0.5)
Eosinophils Relative: 0 %
HCT: 33.9 % — ABNORMAL LOW (ref 39.0–52.0)
Hemoglobin: 10.6 g/dL — ABNORMAL LOW (ref 13.0–17.0)
Immature Granulocytes: 1 %
Lymphocytes Relative: 9 %
Lymphs Abs: 0.6 10*3/uL — ABNORMAL LOW (ref 0.7–4.0)
MCH: 30.2 pg (ref 26.0–34.0)
MCHC: 31.3 g/dL (ref 30.0–36.0)
MCV: 96.6 fL (ref 80.0–100.0)
Monocytes Absolute: 0.3 10*3/uL (ref 0.1–1.0)
Monocytes Relative: 3 %
Neutro Abs: 6.4 10*3/uL (ref 1.7–7.7)
Neutrophils Relative %: 87 %
Platelets: 170 10*3/uL (ref 150–400)
RBC: 3.51 MIL/uL — ABNORMAL LOW (ref 4.22–5.81)
RDW: 14.5 % (ref 11.5–15.5)
WBC: 7.4 10*3/uL (ref 4.0–10.5)
nRBC: 0 % (ref 0.0–0.2)

## 2020-08-03 LAB — PHOSPHORUS: Phosphorus: 3.5 mg/dL (ref 2.5–4.6)

## 2020-08-03 LAB — COMPREHENSIVE METABOLIC PANEL
ALT: 13 U/L (ref 0–44)
AST: 27 U/L (ref 15–41)
Albumin: 3.2 g/dL — ABNORMAL LOW (ref 3.5–5.0)
Alkaline Phosphatase: 62 U/L (ref 38–126)
Anion gap: 9 (ref 5–15)
BUN: 35 mg/dL — ABNORMAL HIGH (ref 8–23)
CO2: 26 mmol/L (ref 22–32)
Calcium: 9.1 mg/dL (ref 8.9–10.3)
Chloride: 107 mmol/L (ref 98–111)
Creatinine, Ser: 1.54 mg/dL — ABNORMAL HIGH (ref 0.61–1.24)
GFR, Estimated: 44 mL/min — ABNORMAL LOW (ref >60)
Glucose, Bld: 161 mg/dL — ABNORMAL HIGH (ref 70–99)
Potassium: 4.5 mmol/L (ref 3.5–5.1)
Sodium: 142 mmol/L (ref 135–145)
Total Bilirubin: 0.5 mg/dL (ref 0.3–1.2)
Total Protein: 6.6 g/dL (ref 6.5–8.1)

## 2020-08-03 LAB — RETICULOCYTES
Immature Retic Fract: 22.1 % — ABNORMAL HIGH (ref 2.3–15.9)
RBC.: 3.53 MIL/uL — ABNORMAL LOW (ref 4.22–5.81)
Retic Count, Absolute: 61.8 10*3/uL (ref 19.0–186.0)
Retic Ct Pct: 1.8 % (ref 0.4–3.1)

## 2020-08-03 LAB — IRON AND TIBC
Iron: 47 ug/dL (ref 45–182)
Saturation Ratios: 14 % — ABNORMAL LOW (ref 17.9–39.5)
TIBC: 340 ug/dL (ref 250–450)
UIBC: 293 ug/dL

## 2020-08-03 LAB — GLUCOSE, CAPILLARY
Glucose-Capillary: 124 mg/dL — ABNORMAL HIGH (ref 70–99)
Glucose-Capillary: 125 mg/dL — ABNORMAL HIGH (ref 70–99)
Glucose-Capillary: 132 mg/dL — ABNORMAL HIGH (ref 70–99)
Glucose-Capillary: 141 mg/dL — ABNORMAL HIGH (ref 70–99)
Glucose-Capillary: 150 mg/dL — ABNORMAL HIGH (ref 70–99)
Glucose-Capillary: 164 mg/dL — ABNORMAL HIGH (ref 70–99)
Glucose-Capillary: 97 mg/dL (ref 70–99)

## 2020-08-03 LAB — MAGNESIUM: Magnesium: 1.6 mg/dL — ABNORMAL LOW (ref 1.7–2.4)

## 2020-08-03 LAB — VITAMIN B12: Vitamin B-12: 315 pg/mL (ref 180–914)

## 2020-08-03 LAB — FOLATE: Folate: 16.7 ng/mL (ref >5.9)

## 2020-08-03 LAB — TSH: TSH: 1.789 u[IU]/mL (ref 0.350–4.500)

## 2020-08-03 LAB — TROPONIN I (HIGH SENSITIVITY): Troponin I (High Sensitivity): 26 ng/L — ABNORMAL HIGH (ref <18)

## 2020-08-03 LAB — SURGICAL PATHOLOGY

## 2020-08-03 LAB — FERRITIN: Ferritin: 30 ng/mL (ref 24–336)

## 2020-08-03 MED ORDER — IPRATROPIUM-ALBUTEROL 0.5-2.5 (3) MG/3ML IN SOLN: 3.0000 mL | Freq: Four times a day (QID) | RESPIRATORY_TRACT | Status: DC | PRN

## 2020-08-03 MED ORDER — ARFORMOTEROL TARTRATE 15 MCG/2ML IN NEBU: 15.0000 ug | INHALATION_SOLUTION | Freq: Two times a day (BID) | RESPIRATORY_TRACT | Status: DC

## 2020-08-03 MED ORDER — ARFORMOTEROL TARTRATE 15 MCG/2ML IN NEBU
15.0000 ug | INHALATION_SOLUTION | Freq: Two times a day (BID) | RESPIRATORY_TRACT | Status: DC
  Administered 2020-08-03 – 2020-08-08 (×10): 15 ug via RESPIRATORY_TRACT
  Filled 2020-08-03 (×11): qty 2

## 2020-08-03 MED ORDER — IPRATROPIUM-ALBUTEROL 0.5-2.5 (3) MG/3ML IN SOLN
3.0000 mL | Freq: Four times a day (QID) | RESPIRATORY_TRACT | Status: DC
  Administered 2020-08-03: 3 mL via RESPIRATORY_TRACT
  Filled 2020-08-03: qty 3

## 2020-08-03 MED ORDER — VITAMIN B-12 1000 MCG PO TABS
1000.0000 ug | ORAL_TABLET | Freq: Every day | ORAL | Status: DC
  Administered 2020-08-03 – 2020-08-08 (×6): 1000 ug via ORAL
  Filled 2020-08-03 (×6): qty 1

## 2020-08-03 MED ORDER — REVEFENACIN 175 MCG/3ML IN SOLN
175.0000 ug | Freq: Every day | RESPIRATORY_TRACT | Status: DC
  Administered 2020-08-03 – 2020-08-08 (×5): 175 ug via RESPIRATORY_TRACT
  Filled 2020-08-03 (×6): qty 3

## 2020-08-03 MED ORDER — ZOLPIDEM TARTRATE 5 MG PO TABS
5.0000 mg | ORAL_TABLET | Freq: Once | ORAL | Status: AC
  Administered 2020-08-03: 5 mg via ORAL
  Filled 2020-08-03: qty 1

## 2020-08-03 MED ORDER — BUDESONIDE 0.25 MG/2ML IN SUSP
0.2500 mg | Freq: Two times a day (BID) | RESPIRATORY_TRACT | Status: DC
  Administered 2020-08-03 – 2020-08-08 (×10): 0.25 mg via RESPIRATORY_TRACT
  Filled 2020-08-03 (×11): qty 2

## 2020-08-03 MED ORDER — AMLODIPINE BESYLATE 5 MG PO TABS
5.0000 mg | ORAL_TABLET | Freq: Every day | ORAL | Status: DC
  Administered 2020-08-03: 5 mg via ORAL
  Filled 2020-08-03 (×2): qty 1

## 2020-08-03 MED ORDER — MAGNESIUM SULFATE 2 GM/50ML IV SOLN
2.0000 g | Freq: Once | INTRAVENOUS | Status: AC
  Administered 2020-08-03: 2 g via INTRAVENOUS
  Filled 2020-08-03: qty 50

## 2020-08-03 NOTE — Anesthesia Postprocedure Evaluation (Signed)
Anesthesia Post Note  Patient: Bradley Johns  Procedure(s) Performed: TRANSURETHRAL RESECTION OF BLADDER TUMOR (N/A )     Patient location during evaluation: PACU Anesthesia Type: Spinal Level of consciousness: oriented and awake and alert Pain management: pain level controlled Vital Signs Assessment: post-procedure vital signs reviewed and stable Respiratory status: spontaneous breathing, respiratory function stable, patient connected to nasal cannula oxygen and nonlabored ventilation Cardiovascular status: blood pressure returned to baseline and stable Postop Assessment: no headache, no backache, no apparent nausea or vomiting and spinal receding Anesthetic complications: no   No complications documented.                 Jaron Czarnecki A.

## 2020-08-03 NOTE — Evaluation (Signed)
Occupational Therapy Evaluation Patient Details Name: Bradley Johns MRN: [id number] DOB: February 21, 1935 Today's Date: 08/03/2020    History of Present Illness Bradley Johns is a 86 y.o. male with medical history significant of HTN, bladder cancer, symptomatic anemia, pulmonary fibrosis, COPD, chronic respiratory failure with hypoxia on 3 L, gout, HLD HTN, DM2, CKD stage III, history of PEA arrest   Clinical Impression   Bradley Johns is an 86 year old man who presents sitting on the side of the bed on 5 L [place]. On evaluation he demonstrates normal upper body strength, baseline balance, and ability to perform functional mobility and ADLs. Patient on increased o2 from baseline and o2 sat dropped to 81% on 6 L after walking in the hall. No significant complaints of dyspnea compared to his baseline. No OT needs at this time.     Follow Up Recommendations  No OT follow up    Equipment Recommendations  None recommended by OT    Recommendations for Other Services       Precautions / Restrictions Precautions Precaution Comments: monitor o2 sats Restrictions Weight Bearing Restrictions: No      Mobility Bed Mobility Overal bed mobility: Modified Independent             General bed mobility comments: sittong side of bed    Transfers Overall transfer level: Needs assistance Equipment used: Rolling walker (2 wheeled);Straight cane Transfers: Sit to/from Stand Sit to Stand: Supervision         General transfer comment: Ambulated initially with RW and then personal cane on 6 L [place]. Patient's o2 sat monitored. 81% on return to room. Reports dyspnea but no other significant difficulties.    Balance Overall balance assessment: Mild deficits observed, not formally tested                                         ADL either performed or assessed with clinical judgement   ADL Overall ADL's : At baseline                                              Vision Patient Visual Report: No change from baseline       Perception     Praxis      Pertinent Vitals/Pain Pain Assessment: No/denies pain     Hand Dominance Right   Extremity/Trunk Assessment Upper Extremity Assessment Upper Extremity Assessment: Overall WFL for tasks assessed   Lower Extremity Assessment Lower Extremity Assessment: Defer to PT evaluation   Cervical / Trunk Assessment Cervical / Trunk Assessment: Normal   Communication Communication Communication: No difficulties   Cognition Arousal/Alertness: Awake/alert Behavior During Therapy: WFL for tasks assessed/performed Overall Cognitive Status: Within Functional Limits for tasks assessed                                     General Comments       Exercises     Shoulder Instructions      Home Living Family/patient expects to be discharged to:: Private residence Living Arrangements: Spouse/significant other Available Help at Discharge: Family;Available PRN/intermittently Type of Home: House Home Access: Stairs to enter Entrance Stairs-Number of Steps: 4 Entrance Stairs-Rails: Right  Home Layout: Two level;Bed/bath upstairs Alternate Level Stairs-Number of Steps: 13 Alternate Level Stairs-Rails: Left Bathroom Shower/Tub: Occupational psychologist: Standard     Home Equipment: Cane - single point;Other (comment)          Prior Functioning/Environment Level of Independence: Independent with assistive device(s);Needs assistance  Gait / Transfers Assistance Needed: uses cane ADL's / Homemaking Assistance Needed: sits to shower, but is independent in self care            OT Problem List: Cardiopulmonary status limiting activity      OT Treatment/Interventions:      OT Goals(Current goals can be found in the care plan section) Acute Rehab OT Goals OT Goal Formulation: All assessment and education complete, DC therapy  OT Frequency:     Barriers to D/C:             Co-evaluation              AM-PAC OT "6 Clicks" Daily Activity     Outcome Measure Help from another person eating meals?: None Help from another person taking care of personal grooming?: None Help from another person toileting, which includes using toliet, bedpan, or urinal?: None Help from another person bathing (including washing, rinsing, drying)?: None Help from another person to put on and taking off regular upper body clothing?: None Help from another person to put on and taking off regular lower body clothing?: None 6 Click Score: 24   End of Session Equipment Utilized During Treatment: Gait belt;Rolling walker Nurse Communication: Mobility status  Activity Tolerance: Patient tolerated treatment well Patient left: in chair;with call bell/phone within reach;with chair alarm set  OT Visit Diagnosis: Muscle weakness (generalized) (M62.81)                Time: 2706-2376 OT Time Calculation (min): 29 min Charges:  OT General Charges $OT Visit: 1 Visit OT Evaluation $OT Eval Moderate Complexity: 1 Mod  Guy Toney, OTR/L [place]  Office [phone] Pager: [place] 08/03/2020, 10:28 AM

## 2020-08-03 NOTE — Progress Notes (Signed)
1 Day Post-Op Subjective: Patient reports occasional bladder spasm.  Appreciate  Dr Rueben Bash assistance  Objective: Vital signs in last 24 hours: Temp:  [97.5 F (36.4 C)-98.3 F (36.8 C)] 97.5 F (36.4 C) (04/29 0412) Pulse Rate:  [44-98] 71 (04/29 0412) Resp:  [0-25] 18 (04/29 0412) BP: (126-214)/(49-99) 148/49 (04/29 0412) SpO2:  [73 %-100 %] 92 % (04/29 0412) Weight:  [80.4 kg-81.4 kg] 80.4 kg (04/28 2117)  Intake/Output from previous day: 04/28 0701 - 04/29 0700 In: 1443.5 [P.O.:360; I.V.:983.5; IV Piggyback:100] Out: 2302 [Urine:2300; Stool:2] Intake/Output this shift: No intake/output data recorded.  Physical Exam:  Constitutional: Vital signs reviewed. WD WN in NAD   Eyes: PERRL, No scleral icterus.   Cardiovascular: RRR Extremities: No cyanosis or edema   Urine red w/o clots  Lab Results: Recent Labs    08/02/20 2126 08/03/20 0034  HGB 11.4* 10.6*  HCT 36.6* 33.9*   BMET Recent Labs    08/02/20 2126 08/03/20 0034  NA 139 142  K 4.9 4.5  CL 107 107  CO2 24 26  GLUCOSE 169* 161*  BUN 35* 35*  CREATININE 1.64* 1.54*  CALCIUM 9.0 9.1   No results for input(s): LABPT, INR in the last 72 hours. No results for input(s): LABURIN in the last 72 hours. Results for orders placed or performed during the hospital encounter of 07/31/20  SARS CORONAVIRUS 2 (TAT 6-24 HRS) Nasopharyngeal Nasopharyngeal Swab     Status: None   Collection Time: 07/31/20  3:16 PM   Specimen: Nasopharyngeal Swab  Result Value Ref Range Status   SARS Coronavirus 2 NEGATIVE NEGATIVE Final    Comment: (NOTE) SARS-CoV-2 target nucleic acids are NOT DETECTED.  The SARS-CoV-2 RNA is generally detectable in upper and lower respiratory specimens during the acute phase of infection. Negative results do not preclude SARS-CoV-2 infection, do not rule out co-infections with other pathogens, and should not be used as the sole basis for treatment or other patient management  decisions. Negative results must be combined with clinical observations, patient history, and epidemiological information. The expected result is Negative.  Fact Sheet for Patients: SugarRoll.be  Fact Sheet for Healthcare Providers: https://www.woods-mathews.com/  This test is not yet approved or cleared by the [place] FDA and  has been authorized for detection and/or diagnosis of SARS-CoV-2 by FDA under an Emergency Use Authorization (EUA). This EUA will remain  in effect (meaning this test can be used) for the duration of the COVID-19 declaration under Se ction 564(b)(1) of the Act, 21 U.S.C. section 360bbb-3(b)(1), unless the authorization is terminated or revoked sooner.  Performed at [facility], [place] [address], [place], [place] [address]     Studies/Results: DG CHEST PORT 1 VIEW  Result Date: 08/02/2020 CLINICAL DATA:  86 year old male with hypoxia EXAM: PORTABLE CHEST 1 VIEW COMPARISON:  Chest radiograph dated 04/03/2020 FINDINGS: Dated there is background of emphysema and diffuse chronic interstitial coarsening. Bilateral mid to lower lung field densities, likely chronic. No focal consolidation, pleural effusion or pneumothorax. Mild cardiomegaly. Atherosclerotic calcification of the aorta. No acute osseous pathology. IMPRESSION: 1. No acute cardiopulmonary process. 2. Emphysema and chronic interstitial coarsening/fibrosis. Electronically Signed   By: Anner Crete M.D.   On: 08/02/2020 20:05    Assessment/Plan:   POD 1 TURBT --large tumor burden.  Postop HTN now controlled  I'm fine w/ d/c from GU standpoint--he can take catheter out tomorrow @ home   LOS: 1 day   Jorja Loa 08/03/2020, 7:36 AM

## 2020-08-03 NOTE — Plan of Care (Signed)
  Problem: Education: Goal: Knowledge of General Education information will improve Description: Including pain rating scale, medication(s)/side effects and non-pharmacologic comfort measures Outcome: Progressing   Problem: Clinical Measurements: Goal: Ability to maintain clinical measurements within normal limits will improve Outcome: Progressing Goal: Respiratory complications will improve Outcome: Progressing Goal: Cardiovascular complication will be avoided Outcome: Progressing   Problem: Activity: Goal: Risk for activity intolerance will decrease Outcome: Progressing   Problem: Elimination: Goal: Will not experience complications related to urinary retention Outcome: Progressing   Problem: Pain Managment: Goal: General experience of comfort will improve Outcome: Progressing   Problem: Safety: Goal: Ability to remain free from injury will improve Outcome: Progressing

## 2020-08-03 NOTE — TOC Initial Note (Signed)
Transition of Care [facility]) - Initial/Assessment Note    Patient Details  Name: Bradley Johns MRN: [id number] Date of Birth: January 25, 1935  [facility] CM/SW Contact:    Leeroy Cha, RN Phone Number: 08/03/2020, 9:23 AM  Clinical Narrative:                 86 y.o. male with medical history significant of HTN, bladder cancer, symptomatic anemia, pulmonary fibrosis, COPD, chronic respiratory failure with hypoxia on 3 L, gout, HLD HTN, DM2, CKD stage III, history of PEA arrest    Presented with patient undergone bladder mass biopsy tonight by urology postoperatively noted to have somewhat elevated blood pressures in the 190s patient at baseline blood pressure 150s 160s.  He is due to get his blood pressure medications tonight.  Is also noted patient was hypoxic down to mid 80s on his 3 to 4 L.  O2 was increased up to 5 L and he was able to sat in the low to mid 90s.  Patient states he has been having progressively worse dyspnea on exertion and chronic per family he has history of COPD and pulmonary fibrosis which she is on allergy.  They found that inhalers are not helping him in any way but he does need to be on chronic oxygen.  In the past he have had a PEA arrest which felt to be secondary to severe anemia he has been taking iron supplements but not consistently. PLAN: following for rpogression and dme 02 needs. Expected Discharge Plan: Home/Self Care Barriers to Discharge: Continued Medical Work up   Patient Goals and CMS Choice Patient states their goals for this hospitalization and ongoing recovery are:: to go home CMS Medicare.gov Compare Post Acute Care list provided to:: Patient    Expected Discharge Plan and Services Expected Discharge Plan: Home/Self Care   Discharge Planning Services: CM Consult   Living arrangements for the past 2 months: Single Family Home Expected Discharge Date: 08/02/20                                    Prior Living  Arrangements/Services Living arrangements for the past 2 months: Single Family Home Lives with:: Spouse Patient language and need for interpreter reviewed:: Yes Do you feel safe going back to the place where you live?: Yes      Need for Family Participation in Patient Care: Yes (Comment) (wife is primary contact) Care giver support system in place?: Yes (comment) (wife)   Criminal Activity/Legal Involvement Pertinent to Current Situation/Hospitalization: No - Comment as needed  Activities of Daily Living Home Assistive Devices/Equipment: Cane (specify quad or straight),CBG Meter,Nebulizer,Other (Comment),Oxygen,Eyeglasses ADL Screening (condition at time of admission) Patient's cognitive ability adequate to safely complete daily activities?: Yes Is the patient deaf or have difficulty hearing?: No Does the patient have difficulty seeing, even when wearing glasses/contacts?: No Does the patient have difficulty concentrating, remembering, or making decisions?: Yes Patient able to express need for assistance with ADLs?: Yes Does the patient have difficulty dressing or bathing?: No Independently performs ADLs?: Yes (appropriate for developmental age) Does the patient have difficulty walking or climbing stairs?: No Weakness of Legs: None Weakness of Arms/Hands: None  Permission Sought/Granted                  Emotional Assessment Appearance:: Appears stated age Attitude/Demeanor/Rapport: Apprehensive Affect (typically observed): Calm Orientation: : Oriented to Self,Oriented to Place,Oriented  to  Time,Oriented to Situation Alcohol / Substance Use: Not Applicable Psych Involvement: No (comment)  Admission diagnosis:  Accelerated hypertension [I10] Patient Active Problem List   Diagnosis Date Noted  . Accelerated hypertension 08/02/2020  . Acute on chronic respiratory failure with hypoxia ([place]) 08/02/2020  . Iron deficiency anemia due to chronic blood loss 08/02/2020  .  Urothelial carcinoma of bladder ([place]) 06/01/2020  . Hypoxia   . Acute respiratory distress   . Cardiac arrest ([place])   . Symptomatic anemia 03/31/2020  . Syncope and collapse 03/31/2020  . AKI (acute kidney injury) ([place]) 03/31/2020  . Chronic respiratory failure with hypoxia ([place]) 06/03/2018  . Anemia associated with acute blood loss 05/20/2018  . Acute GI bleeding 05/17/2018  . PCP NOTES >>>>>>>>>>>>>>>. 12/30/2014  . Pulmonary fibrosis ([place]) 12/14/2012  . Cubital tunnel syndrome on left 11/14/2012  . Dyspnea on exertion 10/11/2012  . Trigger finger of both hands 09/26/2011  . Annual physical exam 03/18/2011  . BPH (benign prostatic hyperplasia) 07/11/2008  . Osteoarthritis 04/18/2008  . Hyperlipidemia 10/06/2006  . Diabetes mellitus type 2 in nonobese ([place]) 04/29/2006  . GOUT 04/29/2006  . Essential hypertension 04/29/2006  . COPD with emphysema, on O2 04/29/2006  . Sleep apnea 04/29/2006   PCP:  Colon Branch, MD Pharmacy:   [facility], [address] [place] [address] Phone: [phone] Fax: [facility] [place], [place] [address] Phone: [phone] Fax: [phone]  [place], [place] [address] [place] [address] Phone: [phone] Fax: [phone]     Social Determinants of Health (SDOH) Interventions    Readmission Risk Interventions No flowsheet data found.

## 2020-08-03 NOTE — Progress Notes (Signed)
Oxygen saturation level 82% on 4 liters at rest. Oxygen increased to 6 l [place] and sat increased to 90%.  Will continue to monitor. Manessa Buley, Laurel Dimmer, RN

## 2020-08-03 NOTE — Progress Notes (Addendum)
PROGRESS NOTE    Bradley Johns  UVO:[id number] DOB: 1934/12/31 DOA: 08/02/2020 PCP: Colon Branch, MD   Brief Narrative: 86 year old with past medical history significant for hypertension, bladder cancer, symptomatic anemia, pulmonary fibrosis, COPD, chronic respiratory failure with hypoxemia on 3 L of oxygen at home, gout, hypertension, CKD stage III, history of PEA arrest who underwent bladder mass removal and biopsy by urology  on 4/28.  Postop patient was noted to have elevated blood pressure systolic in the 742V, and worsening hypoxemia oxygen down to 80s on 3 L of oxygen.  Patient was admitted for management of hypertension and hypoxemia.  He report worsening shortness of breath on exertion and at rest for months.     Assessment & Plan:   Active Problems:   Diabetes mellitus type 2 in nonobese (HCC)   Hyperlipidemia   COPD with emphysema, on O2   Pulmonary fibrosis (HCC)   Urothelial carcinoma of bladder (HCC)   Accelerated hypertension   Acute on chronic respiratory failure with hypoxia (HCC)   Iron deficiency anemia due to chronic blood loss   1-Acute hypoxic respiratory failure, history of pulmonary fibrosis, COPD with emphysema: -On chronic 3 L of oxygen at home. -Worsening hypoxemia could be related to hypoventilation postsurgery. -Chest x-ray: Pulmonary process.  Emphysema and chronic interstitial coarsening/fibrosis -Change nebulizer albuterol ipratropium to PRN -Incentive spirometry. Home Medications; DuoNeb.  -Wean down oxygen as needed.  -Reviewed Pulmonologist Note pre op evaluation in 06/14/2020: He recommended Budesonide BID, Arformoterol BID and Yupelri daily. I have ordered this nebulizer. Change duoneb to PRN.  2-Urothelia Carcinoma of bladder:  -Post Cystoscopy, transurethral resection of bladder tumor -Urology following.   3-Accelerated hypertension: Received 1 dose of IV hydralazine. Continue with Coreg and Cardura. Added Norvasc  Hyperlipidemia:  Not on statins, per PCP  Diabetes type 2: Continue with sliding scale insulin  History of iron deficiency  anemia: Continue with ferrous sulfate.  Hypomagnesemia; Replete IV>  CKD stage III B Prior Cr; range: 1.5--1.9 Monitor Cr.   Estimated body mass index is 26.95 kg/m as calculated from the following:   Height as of this encounter: 5\' 8"  (1.727 m).   Weight as of this encounter: 80.4 kg.   DVT prophylaxis: SCDs Code Status: Full code Family Communication: Daughter Updated at bedside.  Disposition Plan:  Status is: Inpatient  Remains inpatient appropriate because:IV treatments appropriate due to intensity of illness or inability to take PO   Dispo: The patient is from: Home              Anticipated d/c is to: Home              Patient currently is not medically stable to d/c.   Difficult to place patient No        Consultants:   Urology  Procedures:  -Post Cystoscopy, transurethral resection of bladder tumor 4/28   Antimicrobials:    Subjective: He is feeling well, report chronic SOB for months, years.  He had BM today.  Urine is bloody  Objective: Vitals:   08/02/20 2117 08/03/20 0203 08/03/20 0412 08/03/20 0737  BP: (!) 199/74 (!) 147/49 (!) 148/49 (!) 185/74  Pulse: 86 70 71 (!) 59  Resp: 20 18 18 19   Temp: (!) 97.5 F (36.4 C) 98.3 F (36.8 C) (!) 97.5 F (36.4 C) 97.7 F (36.5 C)  TempSrc: Oral Oral Oral   SpO2: 100% 96% 92% 98%  Weight: 80.4 kg     Height: 5\' 8"  (1.727  m)       Intake/Output Summary (Last 24 hours) at 08/03/2020 0915 Last data filed at 08/03/2020 0853 Gross per 24 hour  Intake 1683.5 ml  Output 2302 ml  Net -618.5 ml   Filed Weights   08/02/20 1041 08/02/20 2117  Weight: 81.4 kg 80.4 kg    Examination:  General exam: Appears calm and comfortable  Respiratory system: no wheezing, fine crackles.  Cardiovascular system: S1 & S2 heard, RRR.  Gastrointestinal system: Abdomen is nondistended, soft and nontender.  No organomegaly or masses felt. Normal bowel sounds heard. Central nervous system: Alert and oriented. No focal neurological deficits. Extremities: Symmetric 5 x 5 power.  Data Reviewed: I have personally reviewed following labs and imaging studies  CBC: Recent Labs  Lab 08/02/20 2126 08/03/20 0034  WBC 7.3 7.4  NEUTROABS 6.5 6.4  HGB 11.4* 10.6*  HCT 36.6* 33.9*  MCV 97.3 96.6  PLT 178 401   Basic Metabolic Panel: Recent Labs  Lab 08/02/20 2126 08/03/20 0034  NA 139 142  K 4.9 4.5  CL 107 107  CO2 24 26  GLUCOSE 169* 161*  BUN 35* 35*  CREATININE 1.64* 1.54*  CALCIUM 9.0 9.1  MG  --  1.6*  PHOS  --  3.5   GFR: Estimated Creatinine Clearance: 35.2 mL/min (A) (by C-G formula based on SCr of 1.54 mg/dL (H)). Liver Function Tests: Recent Labs  Lab 08/02/20 2126 08/03/20 0034  AST 28 27  ALT 14 13  ALKPHOS 64 62  BILITOT 0.4 0.5  PROT 7.2 6.6  ALBUMIN 3.4* 3.2*   No results for input(s): LIPASE, AMYLASE in the last 168 hours. No results for input(s): AMMONIA in the last 168 hours. Coagulation Profile: No results for input(s): INR, PROTIME in the last 168 hours. Cardiac Enzymes: No results for input(s): CKTOTAL, CKMB, CKMBINDEX, TROPONINI in the last 168 hours. BNP (last 3 results) No results for input(s): PROBNP in the last 8760 hours. HbA1C: No results for input(s): HGBA1C in the last 72 hours. CBG: Recent Labs  Lab 08/02/20 1829 08/02/20 2046 08/03/20 0021 08/03/20 0412 08/03/20 0733  GLUCAP 155* 176* 150* 164* 125*   Lipid Profile: No results for input(s): CHOL, HDL, LDLCALC, TRIG, CHOLHDL, LDLDIRECT in the last 72 hours. Thyroid Function Tests: Recent Labs    08/03/20 0034  TSH 1.789   Anemia Panel: Recent Labs    08/03/20 0034  VITAMINB12 315  FOLATE 16.7  FERRITIN 30  TIBC 340  IRON 47  RETICCTPCT 1.8   Sepsis Labs: No results for input(s): PROCALCITON, LATICACIDVEN in the last 168 hours.  Recent Results (from the past 240  hour(s))  SARS CORONAVIRUS 2 (TAT 6-24 HRS) Nasopharyngeal Nasopharyngeal Swab     Status: None   Collection Time: 07/31/20  3:16 PM   Specimen: Nasopharyngeal Swab  Result Value Ref Range Status   SARS Coronavirus 2 NEGATIVE NEGATIVE Final    Comment: (NOTE) SARS-CoV-2 target nucleic acids are NOT DETECTED.  The SARS-CoV-2 RNA is generally detectable in upper and lower respiratory specimens during the acute phase of infection. Negative results do not preclude SARS-CoV-2 infection, do not rule out co-infections with other pathogens, and should not be used as the sole basis for treatment or other patient management decisions. Negative results must be combined with clinical observations, patient history, and epidemiological information. The expected result is Negative.  Fact Sheet for Patients: SugarRoll.be  Fact Sheet for Healthcare Providers: https://www.woods-mathews.com/  This test is not yet approved or cleared by  the [employer] and  has been authorized for detection and/or diagnosis of SARS-CoV-2 by FDA under an Emergency Use Authorization (EUA). This EUA will remain  in effect (meaning this test can be used) for the duration of the COVID-19 declaration under Se ction 564(b)(1) of the Act, 21 U.S.C. section 360bbb-3(b)(1), unless the authorization is terminated or revoked sooner.  Performed at [facility], [place] [address] [place], [place] [address]          Radiology Studies: DG CHEST PORT 1 VIEW  Result Date: 08/02/2020 CLINICAL DATA:  86 year old male with hypoxia EXAM: PORTABLE CHEST 1 VIEW COMPARISON:  Chest radiograph dated 04/03/2020 FINDINGS: Dated there is background of emphysema and diffuse chronic interstitial coarsening. Bilateral mid to lower lung field densities, likely chronic. No focal consolidation, pleural effusion or pneumothorax. Mild cardiomegaly. Atherosclerotic calcification of the aorta. No  acute osseous pathology. IMPRESSION: 1. No acute cardiopulmonary process. 2. Emphysema and chronic interstitial coarsening/fibrosis. Electronically Signed   By: Anner Crete M.D.   On: 08/02/2020 20:05        Scheduled Meds: . amLODipine  5 mg Oral Daily  . budesonide (PULMICORT) nebulizer solution  0.25 mg Nebulization BID  . carvedilol  3.125 mg Oral BID WC  . chlorhexidine  15 mL Mouth/Throat Once   Or  . mouth rinse  15 mL Mouth Rinse Once  . Chlorhexidine Gluconate Cloth  6 each Topical Daily  . docusate sodium  100 mg Oral BID  . doxazosin  4 mg Oral QHS  . ferrous sulfate  325 mg Oral Q breakfast  . guaiFENesin  600 mg Oral BID  . insulin aspart  0-9 Units Subcutaneous Q4H  . ipratropium-albuterol  3 mL Nebulization Q6H  . senna  1 tablet Oral BID  . sodium chloride flush  3 mL Intravenous Q12H  . vitamin B-12  1,000 mcg Oral Daily   Continuous Infusions: . sodium chloride    . lactated ringers 10 mL/hr at 08/02/20 1259  . magnesium sulfate bolus IVPB 2 g (08/03/20 0853)     LOS: 1 day    Time spent: 35 minutes    Jericha Bryden A Ruffin Lada, MD [facility]   If 7PM-7AM, please contact night-coverage [email or web address]  08/03/2020, 9:15 AM

## 2020-08-03 NOTE — Plan of Care (Signed)
  Problem: Clinical Measurements: Goal: Respiratory complications will improve 08/03/2020 1327 by Margarette Canada, RN Outcome: Progressing 08/03/2020 1325 by Margarette Canada, RN Outcome: Progressing Goal: Cardiovascular complication will be avoided 08/03/2020 1327 by Margarette Canada, RN Outcome: Progressing 08/03/2020 1325 by Margarette Canada, RN Outcome: Progressing   Problem: Activity: Goal: Risk for activity intolerance will decrease Outcome: Progressing

## 2020-08-03 NOTE — Plan of Care (Signed)
  Problem: Clinical Measurements: Goal: Respiratory complications will improve Outcome: Progressing Goal: Cardiovascular complication will be avoided Outcome: Progressing   Problem: Education: Goal: Knowledge of General Education information will improve Description: Including pain rating scale, medication(s)/side effects and non-pharmacologic comfort measures Outcome: Completed/Met

## 2020-08-03 NOTE — Evaluation (Signed)
Physical Therapy Evaluation Patient Details Name: Bradley Johns MRN: [id number] DOB: 01/31/1935 Today's Date: 08/03/2020   History of Present Illness  NIRAJ KUDRNA is a 86 y.o. male with medical history significant of HTN, bladder cancer, symptomatic anemia, pulmonary fibrosis, COPD, chronic respiratory failure with hypoxia on 3 L, gout, HLD HTN, DM2, CKD stage III, history of PEA arrest  Clinical Impression  Pt admitted as above and presenting with functional mobility limitations 2* limited endurance and premorbid deconditioning.  On evaluation pt demonstrates normal upper and lower body strength, baseline balance, and ability to ambulate limited distances safely with assistive device.  Patient on increased o2 from baseline and o2 sat dropped to 81% on 6 L after walking in the hall. No significant complaints of dyspnea compared to his baseline.  Pt should progress to dc home with family assist.  Follow Up Recommendations No PT follow up    Equipment Recommendations  None recommended by PT    Recommendations for Other Services       Precautions / Restrictions Precautions Precautions: Fall Precaution Comments: monitor o2 sats Restrictions Weight Bearing Restrictions: No      Mobility  Bed Mobility Overal bed mobility: Modified Independent             General bed mobility comments: sitting side of bed    Transfers Overall transfer level: Needs assistance Equipment used: Rolling walker (2 wheeled);Straight cane Transfers: Sit to/from Stand Sit to Stand: Supervision         General transfer comment: min cues for safety in transitions but no physical assist required  Ambulation/Gait Ambulation/Gait assistance: Min guard;Supervision Gait Distance (Feet): 200 Feet Assistive device: Rolling walker (2 wheeled);Straight cane Gait Pattern/deviations: Step-through pattern;Decreased step length - right;Decreased step length - left;Shuffle;Wide base of support Gait  velocity: decr   General Gait Details: Pt ambulated 100' with RW and additional 100' with SPC - mild instability but no LOB and good safety awareness.  Stairs            Wheelchair Mobility    Modified Rankin (Stroke Patients Only)       Balance Overall balance assessment: Mild deficits observed, not formally tested                                           Pertinent Vitals/Pain Pain Assessment: Faces Faces Pain Scale: Hurts even more Pain Location: with bladder spasms Pain Descriptors / Indicators: Grimacing;Spasm Pain Intervention(s): Monitored during session;Limited activity within patient's tolerance;Premedicated before session    Home Living Family/patient expects to be discharged to:: Private residence Living Arrangements: Spouse/significant other Available Help at Discharge: Family;Available PRN/intermittently Type of Home: House Home Access: Stairs to enter Entrance Stairs-Rails: Right Entrance Stairs-Number of Steps: 4 Home Layout: Two level;Bed/bath upstairs Home Equipment: Cane - single point;Other (comment)      Prior Function Level of Independence: Independent with assistive device(s);Needs assistance   Gait / Transfers Assistance Needed: uses cane; does stairs between levels once a day  ADL's / Homemaking Assistance Needed: sits to shower, but is independent in self care        Hand Dominance   Dominant Hand: Right    Extremity/Trunk Assessment   Upper Extremity Assessment Upper Extremity Assessment: Overall WFL for tasks assessed    Lower Extremity Assessment Lower Extremity Assessment: Overall WFL for tasks assessed    Cervical / Trunk Assessment Cervical /  Trunk Assessment: Normal  Communication   Communication: No difficulties  Cognition Arousal/Alertness: Awake/alert Behavior During Therapy: WFL for tasks assessed/performed Overall Cognitive Status: Within Functional Limits for tasks assessed                                         General Comments      Exercises     Assessment/Plan    PT Assessment Patient needs continued PT services  PT Problem List Decreased activity tolerance;Decreased balance;Decreased knowledge of use of DME;Pain       PT Treatment Interventions DME instruction;Gait training;Stair training;Functional mobility training;Therapeutic activities;Therapeutic exercise;Patient/family education    PT Goals (Current goals can be found in the Care Plan section)  Acute Rehab PT Goals Patient Stated Goal: Regain IND PT Goal Formulation: With patient Time For Goal Achievement: 08/16/20 Potential to Achieve Goals: Good    Frequency Min 3X/week   Barriers to discharge        Co-evaluation PT/OT/SLP Co-Evaluation/Treatment: Yes Reason for Co-Treatment: To address functional/ADL transfers PT goals addressed during session: Mobility/safety with mobility OT goals addressed during session: ADL's and self-care       AM-PAC PT "6 Clicks" Mobility  Outcome Measure Help needed turning from your back to your side while in a flat bed without using bedrails?: None Help needed moving from lying on your back to sitting on the side of a flat bed without using bedrails?: None Help needed moving to and from a bed to a chair (including a wheelchair)?: A Little Help needed standing up from a chair using your arms (e.g., wheelchair or bedside chair)?: A Little Help needed to walk in hospital room?: A Little Help needed climbing 3-5 steps with a railing? : A Little 6 Click Score: 20    End of Session Equipment Utilized During Treatment: Gait belt;Oxygen Activity Tolerance: Patient tolerated treatment well;Patient limited by fatigue Patient left: in chair;with call bell/phone within reach;with chair alarm set Nurse Communication: Mobility status PT Visit Diagnosis: Difficulty in walking, not elsewhere classified (R26.2)    Time: 8921-1941 PT Time Calculation  (min) (ACUTE ONLY): 35 min   Charges:   PT Evaluation $PT Eval Low Complexity: [place] [facility] Acute Rehabilitation Services Pager [phone] Office [phone]   Zariel Capano 08/03/2020, 12:03 PM

## 2020-08-04 DIAGNOSIS — D500 Iron deficiency anemia secondary to blood loss (chronic): Secondary | ICD-10-CM | POA: Diagnosis not present

## 2020-08-04 DIAGNOSIS — J9621 Acute and chronic respiratory failure with hypoxia: Secondary | ICD-10-CM | POA: Diagnosis not present

## 2020-08-04 DIAGNOSIS — I10 Essential (primary) hypertension: Secondary | ICD-10-CM | POA: Diagnosis not present

## 2020-08-04 DIAGNOSIS — J439 Emphysema, unspecified: Secondary | ICD-10-CM | POA: Diagnosis not present

## 2020-08-04 LAB — CBC
HCT: 32.9 % — ABNORMAL LOW (ref 39.0–52.0)
Hemoglobin: 10.2 g/dL — ABNORMAL LOW (ref 13.0–17.0)
MCH: 30 pg (ref 26.0–34.0)
MCHC: 31 g/dL (ref 30.0–36.0)
MCV: 96.8 fL (ref 80.0–100.0)
Platelets: 168 10*3/uL (ref 150–400)
RBC: 3.4 MIL/uL — ABNORMAL LOW (ref 4.22–5.81)
RDW: 14.8 % (ref 11.5–15.5)
WBC: 9.1 10*3/uL (ref 4.0–10.5)
nRBC: 0 % (ref 0.0–0.2)

## 2020-08-04 LAB — BASIC METABOLIC PANEL
Anion gap: 6 (ref 5–15)
BUN: 31 mg/dL — ABNORMAL HIGH (ref 8–23)
CO2: 30 mmol/L (ref 22–32)
Calcium: 9.1 mg/dL (ref 8.9–10.3)
Chloride: 107 mmol/L (ref 98–111)
Creatinine, Ser: 1.51 mg/dL — ABNORMAL HIGH (ref 0.61–1.24)
GFR, Estimated: 46 mL/min — ABNORMAL LOW (ref >60)
Glucose, Bld: 92 mg/dL (ref 70–99)
Potassium: 4.7 mmol/L (ref 3.5–5.1)
Sodium: 143 mmol/L (ref 135–145)

## 2020-08-04 LAB — GLUCOSE, CAPILLARY
Glucose-Capillary: 85 mg/dL (ref 70–99)
Glucose-Capillary: 78 mg/dL (ref 70–99)
Glucose-Capillary: 158 mg/dL — ABNORMAL HIGH (ref 70–99)
Glucose-Capillary: 138 mg/dL — ABNORMAL HIGH (ref 70–99)
Glucose-Capillary: 98 mg/dL (ref 70–99)

## 2020-08-04 MED ORDER — HYDRALAZINE HCL 20 MG/ML IJ SOLN
10.0000 mg | INTRAMUSCULAR | Status: DC | PRN
  Administered 2020-08-04 – 2020-08-05 (×2): 10 mg via INTRAVENOUS
  Filled 2020-08-04 (×2): qty 1

## 2020-08-04 MED ORDER — AMLODIPINE BESYLATE 10 MG PO TABS
10.0000 mg | ORAL_TABLET | Freq: Every day | ORAL | Status: DC
  Administered 2020-08-04 – 2020-08-08 (×5): 10 mg via ORAL
  Filled 2020-08-04 (×5): qty 1

## 2020-08-04 MED ORDER — MORPHINE SULFATE (PF) 2 MG/ML IV SOLN
1.0000 mg | Freq: Once | INTRAVENOUS | Status: AC
  Administered 2020-08-04: 1 mg via INTRAVENOUS
  Filled 2020-08-04: qty 1

## 2020-08-04 MED ORDER — HYDRALAZINE HCL 20 MG/ML IJ SOLN: 2.0000 mg | INTRAMUSCULAR | Status: DC | PRN

## 2020-08-04 MED ORDER — INSULIN ASPART 100 UNIT/ML IJ SOLN
0.0000 [IU] | Freq: Three times a day (TID) | INTRAMUSCULAR | Status: DC
  Administered 2020-08-04: 1 [IU] via SUBCUTANEOUS
  Administered 2020-08-05 (×2): 2 [IU] via SUBCUTANEOUS
  Administered 2020-08-05: 3 [IU] via SUBCUTANEOUS
  Administered 2020-08-06 (×3): 1 [IU] via SUBCUTANEOUS
  Administered 2020-08-06: 3 [IU] via SUBCUTANEOUS
  Administered 2020-08-07 (×2): 2 [IU] via SUBCUTANEOUS
  Administered 2020-08-07: 5 [IU] via SUBCUTANEOUS

## 2020-08-04 MED ORDER — MELATONIN 3 MG PO TABS
3.0000 mg | ORAL_TABLET | Freq: Once | ORAL | Status: AC
  Administered 2020-08-04: 3 mg via ORAL
  Filled 2020-08-04: qty 1

## 2020-08-04 MED ORDER — CLONIDINE HCL 0.1 MG PO TABS
0.1000 mg | ORAL_TABLET | Freq: Two times a day (BID) | ORAL | Status: DC
  Administered 2020-08-04 – 2020-08-08 (×8): 0.1 mg via ORAL
  Filled 2020-08-04 (×9): qty 1

## 2020-08-04 NOTE — Plan of Care (Signed)
  Problem: Clinical Measurements: Goal: Respiratory complications will improve Outcome: Progressing   Problem: Clinical Measurements: Goal: Cardiovascular complication will be avoided Outcome: Progressing   Problem: Activity: Goal: Risk for activity intolerance will decrease Outcome: Progressing   Problem: Coping: Goal: Level of anxiety will decrease Outcome: Progressing   Problem: Pain Managment: Goal: General experience of comfort will improve Outcome: Progressing   

## 2020-08-04 NOTE — Progress Notes (Signed)
PROGRESS NOTE    Bradley Johns  FXT:[id number] DOB: [birth year] DOA: 08/02/2020 PCP: Colon Branch, MD   Brief Narrative: 86 year old with past medical history significant for hypertension, bladder cancer, symptomatic anemia, pulmonary fibrosis, COPD, chronic respiratory failure with hypoxemia on 3 L of oxygen at home, gout, hypertension, CKD stage III, history of PEA arrest who underwent bladder mass removal and biopsy by urology  on 4/28.  Postop patient was noted to have elevated blood pressure systolic in the 299M, and worsening hypoxemia oxygen down to 80s on 3 L of oxygen.  Patient was admitted for management of hypertension and hypoxemia.  He report worsening shortness of breath on exertion and at rest for months.  08/04/2020: Patient seen.  Blood pressure remains uncontrolled.  Will increase Norvasc to 10 Mg p.o. once daily from 5 Mg p.o. once daily.  We will add clonidine cautiously.  We will hold clonidine for systolic blood pressure less than 130 mmHg.  Pulmonary symptoms persist.  Patient reports being constipated.   Assessment & Plan:   Active Problems:   Diabetes mellitus type 2 in nonobese (HCC)   Hyperlipidemia   COPD with emphysema, on O2   Pulmonary fibrosis (HCC)   Urothelial carcinoma of bladder (HCC)   Accelerated hypertension   Acute on chronic respiratory failure with hypoxia (HCC)   Iron deficiency anemia due to chronic blood loss   1-Acute hypoxic respiratory failure, history of pulmonary fibrosis, COPD with emphysema: -On chronic 3 L of oxygen at home. -Worsening hypoxemia could be related to hypoventilation postsurgery. -Chest x-ray: Pulmonary process.  Emphysema and chronic interstitial coarsening/fibrosis -Change nebulizer albuterol ipratropium to PRN -Incentive spirometry. Home Medications; DuoNeb.  -Wean down oxygen as needed.  -Reviewed Pulmonologist Note pre op evaluation in 06/14/2020: He recommended Budesonide BID, Arformoterol BID and Yupelri daily. I  have ordered this nebulizer. Change duoneb to PRN. 08/04/2020: Seems stable.  May benefit from palliative care follow-up on discharge.  2-Urothelia Carcinoma of bladder:  -Post Cystoscopy, transurethral resection of bladder tumor -Urology following.   3-Accelerated hypertension: Received 1 dose of IV hydralazine. Continue with Coreg and Cardura. Added Norvasc 08/04/2020: Increase the dose of Norvasc from 5 Mg p.o. once daily to 10 Mg p.o. once daily.  Add clonidine cautiously.  Hyperlipidemia: Not on statins, per PCP  Diabetes type 2: Continue with sliding scale insulin  History of iron deficiency  anemia: Continue with ferrous sulfate.  Hypomagnesemia; Replete IV>  CKD stage III B Prior Cr; range: 1.5--1.9 Monitor Cr.   Constipation: -Not responding to MiraLAX and senna. -We will add Colace. -We will use soapsuds enema as needed. -Patient is on opiates and iron.  Estimated body mass index is 26.95 kg/m as calculated from the following:   Height as of this encounter: 5\' 8"  (1.727 m).   Weight as of this encounter: 80.4 kg.   DVT prophylaxis: SCDs Code Status: Full code Family Communication: Daughter Updated at bedside.  Disposition Plan:  Status is: Inpatient  Remains inpatient appropriate because:IV treatments appropriate due to intensity of illness or inability to take PO   Dispo: The patient is from: Home              Anticipated d/c is to: Home              Patient currently is not medically stable to d/c.   Difficult to place patient No        Consultants:   Urology  Procedures:  -Post Cystoscopy, transurethral resection  of bladder tumor 4/28   Antimicrobials:    Subjective: Chronic shortness of breath process.   Patient reports being constipated.    Objective: Vitals:   08/04/20 0424 08/04/20 0432 08/04/20 0443 08/04/20 0811  BP: (!) 227/84 (!) 178/72 (!) 178/92   Pulse: 92 69    Resp: 18     Temp: 98.5 F (36.9 C)     TempSrc: Oral      SpO2: 99%   92%  Weight:      Height:        Intake/Output Summary (Last 24 hours) at 08/04/2020 1019 Last data filed at 08/04/2020 1003 Gross per 24 hour  Intake 1211.79 ml  Output 3100 ml  Net -1888.21 ml   Filed Weights   08/02/20 1041 08/02/20 2117  Weight: 81.4 kg 80.4 kg    Examination:  General exam: Appears calm and comfortable.  Chronically ill looking. Respiratory system: Decreased air entry globally.    Cardiovascular system: S1 & S2 heard, RRR.  Gastrointestinal system: Abdomen is nondistended, soft and nontender.  Central nervous system: Alert and oriented. No focal neurological deficits.  Data Reviewed: I have personally reviewed following labs and imaging studies  CBC: Recent Labs  Lab 08/02/20 2126 08/03/20 0034 08/04/20 0354  WBC 7.3 7.4 9.1  NEUTROABS 6.5 6.4  --   HGB 11.4* 10.6* 10.2*  HCT 36.6* 33.9* 32.9*  MCV 97.3 96.6 96.8  PLT 178 170 829   Basic Metabolic Panel: Recent Labs  Lab 08/02/20 2126 08/03/20 0034 08/04/20 0354  NA 139 142 143  K 4.9 4.5 4.7  CL 107 107 107  CO2 24 26 30   GLUCOSE 169* 161* 92  BUN 35* 35* 31*  CREATININE 1.64* 1.54* 1.51*  CALCIUM 9.0 9.1 9.1  MG  --  1.6*  --   PHOS  --  3.5  --    GFR: Estimated Creatinine Clearance: 35.9 mL/min (A) (by C-G formula based on SCr of 1.51 mg/dL (H)). Liver Function Tests: Recent Labs  Lab 08/02/20 2126 08/03/20 0034  AST 28 27  ALT 14 13  ALKPHOS 64 62  BILITOT 0.4 0.5  PROT 7.2 6.6  ALBUMIN 3.4* 3.2*   No results for input(s): LIPASE, AMYLASE in the last 168 hours. No results for input(s): AMMONIA in the last 168 hours. Coagulation Profile: No results for input(s): INR, PROTIME in the last 168 hours. Cardiac Enzymes: No results for input(s): CKTOTAL, CKMB, CKMBINDEX, TROPONINI in the last 168 hours. BNP (last 3 results) No results for input(s): PROBNP in the last 8760 hours. HbA1C: No results for input(s): HGBA1C in the last 72 hours. CBG: Recent  Labs  Lab 08/03/20 1658 08/03/20 2146 08/03/20 2350 08/04/20 0422 08/04/20 0735  GLUCAP 97 141* 132* 85 78   Lipid Profile: No results for input(s): CHOL, HDL, LDLCALC, TRIG, CHOLHDL, LDLDIRECT in the last 72 hours. Thyroid Function Tests: Recent Labs    08/03/20 0034  TSH 1.789   Anemia Panel: Recent Labs    08/03/20 0034  VITAMINB12 315  FOLATE 16.7  FERRITIN 30  TIBC 340  IRON 47  RETICCTPCT 1.8   Sepsis Labs: No results for input(s): PROCALCITON, LATICACIDVEN in the last 168 hours.  Recent Results (from the past 240 hour(s))  SARS CORONAVIRUS 2 (TAT 6-24 HRS) Nasopharyngeal Nasopharyngeal Swab     Status: None   Collection Time: 07/31/20  3:16 PM   Specimen: Nasopharyngeal Swab  Result Value Ref Range Status   SARS Coronavirus 2 NEGATIVE NEGATIVE  Final    Comment: (NOTE) SARS-CoV-2 target nucleic acids are NOT DETECTED.  The SARS-CoV-2 RNA is generally detectable in upper and lower respiratory specimens during the acute phase of infection. Negative results do not preclude SARS-CoV-2 infection, do not rule out co-infections with other pathogens, and should not be used as the sole basis for treatment or other patient management decisions. Negative results must be combined with clinical observations, patient history, and epidemiological information. The expected result is Negative.  Fact Sheet for Patients: SugarRoll.be  Fact Sheet for Healthcare Providers: https://www.woods-mathews.com/  This test is not yet approved or cleared by the [place] FDA and  has been authorized for detection and/or diagnosis of SARS-CoV-2 by FDA under an Emergency Use Authorization (EUA). This EUA will remain  in effect (meaning this test can be used) for the duration of the COVID-19 declaration under Se ction 564(b)(1) of the Act, 21 U.S.C. section 360bbb-3(b)(1), unless the authorization is terminated or revoked  sooner.  Performed at [facility], [place] [address], [place], [place] [address]          Radiology Studies: DG CHEST PORT 1 VIEW  Result Date: 08/02/2020 CLINICAL DATA:  86 year old male with hypoxia EXAM: PORTABLE CHEST 1 VIEW COMPARISON:  Chest radiograph dated 04/03/2020 FINDINGS: Dated there is background of emphysema and diffuse chronic interstitial coarsening. Bilateral mid to lower lung field densities, likely chronic. No focal consolidation, pleural effusion or pneumothorax. Mild cardiomegaly. Atherosclerotic calcification of the aorta. No acute osseous pathology. IMPRESSION: 1. No acute cardiopulmonary process. 2. Emphysema and chronic interstitial coarsening/fibrosis. Electronically Signed   By: Anner Crete M.D.   On: 08/02/2020 20:05        Scheduled Meds: . amLODipine  5 mg Oral Daily  . arformoterol  15 mcg Nebulization BID  . budesonide (PULMICORT) nebulizer solution  0.25 mg Nebulization BID  . carvedilol  3.125 mg Oral BID WC  . Chlorhexidine Gluconate Cloth  6 each Topical Daily  . docusate sodium  100 mg Oral BID  . doxazosin  4 mg Oral QHS  . ferrous sulfate  325 mg Oral Q breakfast  . guaiFENesin  600 mg Oral BID  . insulin aspart  0-9 Units Subcutaneous Q4H  . revefenacin  175 mcg Nebulization Daily  . senna  1 tablet Oral BID  . sodium chloride flush  3 mL Intravenous Q12H  . vitamin B-12  1,000 mcg Oral Daily   Continuous Infusions: . sodium chloride    . lactated ringers 10 mL/hr at 08/04/20 0731     LOS: 2 days    Time spent: 35 minutes    Bonnell Public, MD [facility]   If 7PM-7AM, please contact night-coverage [email or web address]  08/04/2020, 10:19 AM

## 2020-08-04 NOTE — Progress Notes (Signed)
Physical Therapy Treatment Patient Details Name: Bradley Johns MRN: [id number] DOB: 1934/07/22 Today's Date: 08/04/2020    History of Present Illness Bradley Johns is a 86 y.o. male with medical history significant of HTN, bladder cancer, symptomatic anemia, pulmonary fibrosis, COPD, chronic respiratory failure with hypoxia on 3 L, gout, HLD HTN, DM2, CKD stage III, history of PEA arrest    PT Comments    Pt OOB  In recliner.  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  Pt on 6 HFNC today vs 2 lts last session.  Assisted with amb.  General Gait Details: amb with walker for safety and monitoring HR/O2 sats.  Pt required 6 HFNC to achieve sats >90%.  Also required x 4 standing rest breaks due to dyspnea.  Instructed to to decrease his gait speed and perform purse lip breathing.   Follow Up Recommendations  No PT follow up     Equipment Recommendations  None recommended by PT (pt declines need for a walker)    Recommendations for Other Services       Precautions / Restrictions Precautions Precautions: Fall Precaution Comments: monitor o2 sats    Mobility  Bed Mobility               General bed mobility comments: OOB in recliner    Transfers Overall transfer level: Needs assistance Equipment used: Rolling walker (2 wheeled);Straight cane Transfers: Sit to/from Stand Sit to Stand: Supervision         General transfer comment: min cues for safety in transitions but no physical assist required.  Normanlly uses a cane at home.  Ambulation/Gait Ambulation/Gait assistance: Min guard;Supervision Gait Distance (Feet): 220 Feet Assistive device: Rolling walker (2 wheeled);Straight cane Gait Pattern/deviations: Step-through pattern;Decreased step length - right;Decreased step length - left;Shuffle;Wide base of support Gait velocity: decrease   General Gait Details: amb with walker for safety and monitoring HR/O2 sats.  Pt required 6 HFNC to achieve sats  >90%.  Also required x 4 standing rest breaks due to dyspnea.  Instructed to to decrease his gait speed and perform purse lip breathing.   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        Pertinent Vitals/Pain Pain Assessment: No/denies pain    Home Living                      Prior Function            PT Goals (current goals can now be found in the care plan section) Progress towards PT goals: Progressing toward goals    Frequency    Min 3X/week      PT Plan Current  plan remains appropriate    Co-evaluation              AM-PAC PT "6 Clicks" Mobility   Outcome Measure  Help needed turning from your back to your side while in a flat bed without using bedrails?: A Little Help needed moving from lying on your back to sitting on the side of a flat bed without using bedrails?: A Little Help needed moving to and from a bed to a chair (including a wheelchair)?: A Little Help needed standing up from a chair using your arms (e.g., wheelchair or bedside chair)?: A Little Help needed to walk in hospital room?: A Little Help needed climbing 3-5 steps with a railing? : A Little 6 Click Score: 18    End of Session Equipment Utilized During Treatment: Gait belt;Oxygen Activity Tolerance: Patient limited by fatigue Patient left: in chair;with call bell/phone within reach;with chair alarm set Nurse Communication: Mobility status PT Visit Diagnosis: Difficulty in walking, not elsewhere classified (R26.2)     Time:  1040-1105 PT Time Calculation (min) (ACUTE ONLY): 25 min  Charges:  $Gait Training: 8-22 mins $Therapeutic Activity: 8-22 mins                     Rica Koyanagi  PTA Acute  Rehabilitation Services Pager      [phone] Office      [phone]

## 2020-08-04 NOTE — Progress Notes (Signed)
Nursing staff able to titrate pt hfnc down to 6l with pt sating at 96 percent on that setting . Pt breathing overall improved from this am. RN will continue to monitor. Family at bedside.

## 2020-08-05 ENCOUNTER — Inpatient Hospital Stay (HOSPITAL_COMMUNITY): Payer: Medicare HMO

## 2020-08-05 DIAGNOSIS — D500 Iron deficiency anemia secondary to blood loss (chronic): Secondary | ICD-10-CM | POA: Diagnosis not present

## 2020-08-05 DIAGNOSIS — I10 Essential (primary) hypertension: Secondary | ICD-10-CM | POA: Diagnosis not present

## 2020-08-05 DIAGNOSIS — R0602 Shortness of breath: Secondary | ICD-10-CM

## 2020-08-05 DIAGNOSIS — J9621 Acute and chronic respiratory failure with hypoxia: Secondary | ICD-10-CM | POA: Diagnosis not present

## 2020-08-05 LAB — BLOOD GAS, ARTERIAL
Acid-Base Excess: 5.7 mmol/L — ABNORMAL HIGH (ref 0.0–2.0)
Bicarbonate: 30.5 mmol/L — ABNORMAL HIGH (ref 20.0–28.0)
FIO2: 52
O2 Saturation: 78.7 %
Patient temperature: 98.6
pCO2 arterial: 48.1 mmHg — ABNORMAL HIGH (ref 32.0–48.0)
pH, Arterial: 7.419 (ref 7.350–7.450)
pO2, Arterial: 46.3 mmHg — ABNORMAL LOW (ref 83.0–108.0)

## 2020-08-05 LAB — GLUCOSE, CAPILLARY
Glucose-Capillary: 90 mg/dL (ref 70–99)
Glucose-Capillary: 175 mg/dL — ABNORMAL HIGH (ref 70–99)
Glucose-Capillary: 162 mg/dL — ABNORMAL HIGH (ref 70–99)
Glucose-Capillary: 207 mg/dL — ABNORMAL HIGH (ref 70–99)

## 2020-08-05 LAB — BRAIN NATRIURETIC PEPTIDE: B Natriuretic Peptide: 471.6 pg/mL — ABNORMAL HIGH (ref 0.0–100.0)

## 2020-08-05 MED ORDER — FUROSEMIDE 10 MG/ML IJ SOLN
40.0000 mg | Freq: Once | INTRAMUSCULAR | Status: AC
  Administered 2020-08-05: 40 mg via INTRAVENOUS

## 2020-08-05 MED ORDER — FUROSEMIDE 10 MG/ML IJ SOLN
INTRAMUSCULAR | Status: AC
  Filled 2020-08-05: qty 4

## 2020-08-05 MED ORDER — METHYLPREDNISOLONE SODIUM SUCC 40 MG IJ SOLR
40.0000 mg | Freq: Two times a day (BID) | INTRAMUSCULAR | Status: DC
  Administered 2020-08-05 – 2020-08-06 (×2): 40 mg via INTRAVENOUS
  Filled 2020-08-05 (×2): qty 1

## 2020-08-05 NOTE — Progress Notes (Signed)
Notified Lab that ABG being sent for analysis. 

## 2020-08-05 NOTE — Progress Notes (Signed)
Rn discussed with pt [place] about pt continued need for increased 02. Pt remains on high flow [place] and desats with minimal activity requiring titration up and down of 02 [place]. Md ordering additional test and Rn will call Dr. Tresa Moore about the pt foley that remains, was placed by Urology with Surgery. Rn awaiting call back from Dr. Tresa Moore. Pt remains stable overall and md would like pt 02 to remain at 91 percent or above. Rt called to obtain routine ABG. Rn will continue to monitor.

## 2020-08-05 NOTE — Progress Notes (Signed)
Spoke with RN concerning ABG results. Shared with RN that results could be based on mixed venous / arterial sample.  RN states that MD is aware of results and has not ordered any additional gases at this time.

## 2020-08-05 NOTE — Progress Notes (Incomplete)
PROGRESS NOTE    Bradley Johns  RCV:[id number] DOB: 1934/11/17 DOA: 08/02/2020 PCP: Colon Branch, MD   Brief Narrative: 86 year old with past medical history significant for hypertension, bladder cancer, symptomatic anemia, pulmonary fibrosis, COPD, chronic respiratory failure with hypoxemia on 3 L of oxygen at home, gout, hypertension, CKD stage III, history of PEA arrest who underwent bladder mass removal and biopsy by urology  on 4/28.  Postop patient was noted to have elevated blood pressure systolic in the 102H, and worsening hypoxemia oxygen down to 80s on 3 L of oxygen.  Patient was admitted for management of hypertension and hypoxemia.  He report worsening shortness of breath on exertion and at rest for months.  08/04/2020: Patient seen.  Blood pressure remains uncontrolled.  Will increase Norvasc to 10 Mg p.o. once daily from 5 Mg p.o. once daily.  We will add clonidine cautiously.  We will hold clonidine for systolic blood pressure less than 130 mmHg.  Pulmonary symptoms persist.  Patient reports being constipated.   08/05/2020: Blood pressure is better controlled.  Patient is requiring 5 to 6 L of supplemental oxygen.  Patient is normally on 3 L of supplemental oxygen at home.  No significant constipation reported.  We will proceed with chest x-ray, ABG and cardiac BNP.  We will have a low threshold to consult the pulmonary team.  Further management depend on above.  Assessment & Plan:   Active Problems:   Diabetes mellitus type 2 in nonobese (HCC)   Hyperlipidemia   COPD with emphysema, on O2   Pulmonary fibrosis (HCC)   Urothelial carcinoma of bladder (HCC)   Accelerated hypertension   Acute on chronic respiratory failure with hypoxia (HCC)   Iron deficiency anemia due to chronic blood loss   1-Acute hypoxic respiratory failure, history of pulmonary fibrosis, COPD with emphysema: -On chronic 3 L of oxygen at home. -Worsening hypoxemia could be related to hypoventilation  postsurgery. -Chest x-ray: Pulmonary process.  Emphysema and chronic interstitial coarsening/fibrosis -Change nebulizer albuterol ipratropium to PRN -Incentive spirometry. Home Medications; DuoNeb.  -Wean down oxygen as needed.  -Reviewed Pulmonologist Note pre op evaluation in 06/14/2020: He recommended Budesonide BID, Arformoterol BID and Yupelri daily. I have ordered this nebulizer. Change duoneb to PRN. 08/04/2020: Seems stable.  May benefit from palliative care follow-up on discharge.  2-Urothelia Carcinoma of bladder:  -Post Cystoscopy, transurethral resection of bladder tumor -Urology following.   3-Accelerated hypertension: Received 1 dose of IV hydralazine. Continue with Coreg and Cardura. Added Norvasc 08/04/2020: Increase the dose of Norvasc from 5 Mg p.o. once daily to 10 Mg p.o. once daily.  Add clonidine cautiously.  Hyperlipidemia: Not on statins, per PCP  Diabetes type 2: Continue with sliding scale insulin  History of iron deficiency  anemia: Continue with ferrous sulfate.  Hypomagnesemia; Replete IV>  CKD stage III B Prior Cr; range: 1.5--1.9 Monitor Cr.   Constipation: -Not responding to MiraLAX and senna. -We will add Colace. -We will use soapsuds enema as needed. -Patient is on opiates and iron.  Estimated body mass index is 26.95 kg/m as calculated from the following:   Height as of this encounter: 5\' 8"  (1.727 m).   Weight as of this encounter: 80.4 kg.   DVT prophylaxis: SCDs Code Status: Full code Family Communication: Daughter Updated at bedside.  Disposition Plan:  Status is: Inpatient  Remains inpatient appropriate because:IV treatments appropriate due to intensity of illness or inability to take PO   Dispo: The patient is from: Home  Anticipated d/c is to: Home              Patient currently is not medically stable to d/c.   Difficult to place patient No        Consultants:   Urology  Procedures:  -Post  Cystoscopy, transurethral resection of bladder tumor 4/28   Antimicrobials:    Subjective: Chronic shortness of breath process.   Patient reports being constipated.    Objective: Vitals:   08/04/20 2123 08/05/20 0435 08/05/20 0620 08/05/20 0749  BP: (!) 159/63 (!) 173/65 (!) 144/60   Pulse: 70 63 76   Resp: 18 18    Temp: 98.3 F (36.8 C) 98.4 F (36.9 C)    TempSrc: Oral Oral    SpO2: 96% 94% 97% 96%  Weight:      Height:        Intake/Output Summary (Last 24 hours) at 08/05/2020 1303 Last data filed at 08/05/2020 0946 Gross per 24 hour  Intake 1683 ml  Output 3450 ml  Net -1767 ml   Filed Weights   08/02/20 1041 08/02/20 2117  Weight: 81.4 kg 80.4 kg    Examination:  General exam: Appears calm and comfortable.  Chronically ill looking. Respiratory system: Decreased air entry globally.    Cardiovascular system: S1 & S2 heard, RRR.  Gastrointestinal system: Abdomen is nondistended, soft and nontender.  Central nervous system: Alert and oriented. No focal neurological deficits.  Data Reviewed: I have personally reviewed following labs and imaging studies  CBC: Recent Labs  Lab 08/02/20 2126 08/03/20 0034 08/04/20 0354  WBC 7.3 7.4 9.1  NEUTROABS 6.5 6.4  --   HGB 11.4* 10.6* 10.2*  HCT 36.6* 33.9* 32.9*  MCV 97.3 96.6 96.8  PLT 178 170 703   Basic Metabolic Panel: Recent Labs  Lab 08/02/20 2126 08/03/20 0034 08/04/20 0354  NA 139 142 143  K 4.9 4.5 4.7  CL 107 107 107  CO2 24 26 30   GLUCOSE 169* 161* 92  BUN 35* 35* 31*  CREATININE 1.64* 1.54* 1.51*  CALCIUM 9.0 9.1 9.1  MG  --  1.6*  --   PHOS  --  3.5  --    GFR: Estimated Creatinine Clearance: 35.9 mL/min (A) (by C-G formula based on SCr of 1.51 mg/dL (H)). Liver Function Tests: Recent Labs  Lab 08/02/20 2126 08/03/20 0034  AST 28 27  ALT 14 13  ALKPHOS 64 62  BILITOT 0.4 0.5  PROT 7.2 6.6  ALBUMIN 3.4* 3.2*   No results for input(s): LIPASE, AMYLASE in the last 168 hours. No  results for input(s): AMMONIA in the last 168 hours. Coagulation Profile: No results for input(s): INR, PROTIME in the last 168 hours. Cardiac Enzymes: No results for input(s): CKTOTAL, CKMB, CKMBINDEX, TROPONINI in the last 168 hours. BNP (last 3 results) No results for input(s): PROBNP in the last 8760 hours. HbA1C: No results for input(s): HGBA1C in the last 72 hours. CBG: Recent Labs  Lab 08/04/20 1123 08/04/20 1604 08/04/20 2123 08/05/20 0750 08/05/20 1137  GLUCAP 158* 138* 98 90 175*   Lipid Profile: No results for input(s): CHOL, HDL, LDLCALC, TRIG, CHOLHDL, LDLDIRECT in the last 72 hours. Thyroid Function Tests: Recent Labs    08/03/20 0034  TSH 1.789   Anemia Panel: Recent Labs    08/03/20 0034  VITAMINB12 315  FOLATE 16.7  FERRITIN 30  TIBC 340  IRON 47  RETICCTPCT 1.8   Sepsis Labs: No results for input(s): PROCALCITON, LATICACIDVEN in  the last 168 hours.  Recent Results (from the past 240 hour(s))  SARS CORONAVIRUS 2 (TAT 6-24 HRS) Nasopharyngeal Nasopharyngeal Swab     Status: None   Collection Time: 07/31/20  3:16 PM   Specimen: Nasopharyngeal Swab  Result Value Ref Range Status   SARS Coronavirus 2 NEGATIVE NEGATIVE Final    Comment: (NOTE) SARS-CoV-2 target nucleic acids are NOT DETECTED.  The SARS-CoV-2 RNA is generally detectable in upper and lower respiratory specimens during the acute phase of infection. Negative results do not preclude SARS-CoV-2 infection, do not rule out co-infections with other pathogens, and should not be used as the sole basis for treatment or other patient management decisions. Negative results must be combined with clinical observations, patient history, and epidemiological information. The expected result is Negative.  Fact Sheet for Patients: SugarRoll.be  Fact Sheet for Healthcare Providers: https://www.woods-mathews.com/  This test is not yet approved or cleared by  the [place] FDA and  has been authorized for detection and/or diagnosis of SARS-CoV-2 by FDA under an Emergency Use Authorization (EUA). This EUA will remain  in effect (meaning this test can be used) for the duration of the COVID-19 declaration under Se ction 564(b)(1) of the Act, 21 U.S.C. section 360bbb-3(b)(1), unless the authorization is terminated or revoked sooner.  Performed at [facility], [place] [address] [place], [place] [address]          Radiology Studies: No results found.      Scheduled Meds: . amLODipine  10 mg Oral Daily  . arformoterol  15 mcg Nebulization BID  . budesonide (PULMICORT) nebulizer solution  0.25 mg Nebulization BID  . carvedilol  3.125 mg Oral BID WC  . Chlorhexidine Gluconate Cloth  6 each Topical Daily  . cloNIDine  0.1 mg Oral BID  . docusate sodium  100 mg Oral BID  . doxazosin  4 mg Oral QHS  . ferrous sulfate  325 mg Oral Q breakfast  . guaiFENesin  600 mg Oral BID  . insulin aspart  0-9 Units Subcutaneous TID AC & HS  . revefenacin  175 mcg Nebulization Daily  . senna  1 tablet Oral BID  . sodium chloride flush  3 mL Intravenous Q12H  . vitamin B-12  1,000 mcg Oral Daily   Continuous Infusions: . sodium chloride       LOS: 3 days    Time spent: 35 minutes    Bonnell Public, MD [facility]   If 7PM-7AM, please contact night-coverage [email or web address]  08/05/2020, 1:03 PM

## 2020-08-05 NOTE — Progress Notes (Signed)
Dr. Tresa Moore called to clarify that foley can be d/c'ed. He stated as long as medical team does not need it for strict monitoring then it can be d/c'ed from his standpoint. Rn will discuss with Dr. Marthenia Rolling.

## 2020-08-05 NOTE — Progress Notes (Signed)
PROGRESS NOTE    Bradley Johns  ACZ:[id number] DOB: 1935/01/08 DOA: 08/02/2020 PCP: Colon Branch, MD   Brief Narrative: 86 year old with past medical history significant for hypertension, bladder cancer, symptomatic anemia, pulmonary fibrosis, COPD, chronic respiratory failure with hypoxemia on 3 L of oxygen at home, gout, hypertension, CKD stage III, history of PEA arrest who underwent bladder mass removal and biopsy by urology  on 4/28.  Postop patient was noted to have elevated blood pressure systolic in the 109N, and worsening hypoxemia oxygen down to 80s on 3 L of oxygen.  Patient was admitted for management of hypertension and hypoxemia.  He report worsening shortness of breath on exertion and at rest for months.  08/04/2020: Patient seen.  Blood pressure remains uncontrolled.  Will increase Norvasc to 10 Mg p.o. once daily from 5 Mg p.o. once daily.  We will add clonidine cautiously.  We will hold clonidine for systolic blood pressure less than 130 mmHg.  Pulmonary symptoms persist.  Patient reports being constipated.   08/05/2020: Blood pressure is better controlled.  Patient is requiring 5 to 6 L of supplemental oxygen.  Patient is normally on 3 L of supplemental oxygen at home.  No significant constipation reported.  We will proceed with chest x-ray, ABG and cardiac BNP.  We will have a low threshold to consult the pulmonary team.  Further management depend on above.  Assessment & Plan:   Active Problems:   Diabetes mellitus type 2 in nonobese (HCC)   Hyperlipidemia   COPD with emphysema, on O2   Pulmonary fibrosis (HCC)   Urothelial carcinoma of bladder (HCC)   Accelerated hypertension   Acute on chronic respiratory failure with hypoxia (HCC)   Iron deficiency anemia due to chronic blood loss   1-Acute hypoxic respiratory failure, history of pulmonary fibrosis, COPD with emphysema: -On chronic 3 L of oxygen at home. -Worsening hypoxemia could be related to hypoventilation  postsurgery. -Chest x-ray: Pulmonary process.  Emphysema and chronic interstitial coarsening/fibrosis -Change nebulizer albuterol ipratropium to PRN -Incentive spirometry. Home Medications; DuoNeb.  -Wean down oxygen as needed.  -Reviewed Pulmonologist Note pre op evaluation in 06/14/2020: He recommended Budesonide BID, Arformoterol BID and Yupelri daily. I have ordered this nebulizer. Change duoneb to PRN. 08/04/2020: Seems stable.  May benefit from palliative care follow-up on discharge. 08/05/2020: Patient seen.  Patient is requiring 5 to 6 L of supplemental oxygen via nasal cannula.  Chest x-ray done revealed chronic background emphysema and pulmonary fibrosis with no acute airspace disease.  BNP was 471.6.  ABG revealed pH of 7.419, PCO2 of 48, PO2 of 46.  Will administer IV Lasix 40 Mg x1 dose.  We will proceed with VQ scan.  We will add IV Solu-Medrol.  2-Urothelia Carcinoma of bladder:  -Post Cystoscopy, transurethral resection of bladder tumor -Urology following.  08/05/2020: Discontinue Foley catheter if okay with the urology team.  3-Accelerated hypertension: Received 1 dose of IV hydralazine. Continue with Coreg and Cardura. Added Norvasc 08/04/2020: Increase the dose of Norvasc from 5 Mg p.o. once daily to 10 Mg p.o. once daily.  Add clonidine cautiously. 08/05/2020: Blood pressure is better controlled today.  Continue current regimen.  Have a low threshold to scale down antihypertensives.  Hyperlipidemia: Not on statins, per PCP  Diabetes type 2: Continue with sliding scale insulin  History of iron deficiency  anemia: Continue with ferrous sulfate.  Hypomagnesemia; Replete IV>  CKD stage III B Prior Cr; range: 1.5--1.9 Monitor Cr.   Constipation: -Not responding  to MiraLAX and senna. -We will add Colace. -We will use soapsuds enema as needed. -Patient is on opiates and iron. 08/05/2020: Constipation seems to have resolved significantly.  Estimated body mass index is 26.95  kg/m as calculated from the following:   Height as of this encounter: 5\' 8"  (1.727 m).   Weight as of this encounter: 80.4 kg.   DVT prophylaxis: SCDs Code Status: Full code Family Communication: Daughter Updated at bedside.  Disposition Plan:  Status is: Inpatient  Remains inpatient appropriate because:IV treatments appropriate due to intensity of illness or inability to take PO   Dispo: The patient is from: Home              Anticipated d/c is to: Home              Patient currently is not medically stable to d/c.   Difficult to place patient No        Consultants:   Urology  Procedures:  -Post Cystoscopy, transurethral resection of bladder tumor 4/28   Antimicrobials:    Subjective: Chronic shortness of breath process.   Patient is requiring high amount of supplemental oxygen.   Objective: Vitals:   08/05/20 0620 08/05/20 0749 08/05/20 1355 08/05/20 1620  BP: (!) 144/60  (!) 121/54   Pulse: 76  68   Resp:   18 18  Temp:   97.6 F (36.4 C)   TempSrc:   Oral   SpO2: 97% 96% 93% 95%  Weight:      Height:        Intake/Output Summary (Last 24 hours) at 08/05/2020 1625 Last data filed at 08/05/2020 1619 Gross per 24 hour  Intake 1683 ml  Output 3800 ml  Net -2117 ml   Filed Weights   08/02/20 1041 08/02/20 2117  Weight: 81.4 kg 80.4 kg    Examination:  General exam: Appears calm and comfortable.  Chronically ill looking. Respiratory system: Decreased air entry globally.    Cardiovascular system: S1 & S2 heard, RRR.  Gastrointestinal system: Abdomen is nondistended, soft and nontender.  Central nervous system: Alert and oriented. No focal neurological deficits.  Data Reviewed: I have personally reviewed following labs and imaging studies  CBC: Recent Labs  Lab 08/02/20 2126 08/03/20 0034 08/04/20 0354  WBC 7.3 7.4 9.1  NEUTROABS 6.5 6.4  --   HGB 11.4* 10.6* 10.2*  HCT 36.6* 33.9* 32.9*  MCV 97.3 96.6 96.8  PLT 178 170 [id number]   Basic  Metabolic Panel: Recent Labs  Lab 08/02/20 2126 08/03/20 0034 08/04/20 0354  NA 139 142 143  K 4.9 4.5 4.7  CL 107 107 107  CO2 24 26 30   GLUCOSE 169* 161* 92  BUN 35* 35* 31*  CREATININE 1.64* 1.54* 1.51*  CALCIUM 9.0 9.1 9.1  MG  --  1.6*  --   PHOS  --  3.5  --    GFR: Estimated Creatinine Clearance: 35.9 mL/min (A) (by C-G formula based on SCr of 1.51 mg/dL (H)). Liver Function Tests: Recent Labs  Lab 08/02/20 2126 08/03/20 0034  AST 28 27  ALT 14 13  ALKPHOS 64 62  BILITOT 0.4 0.5  PROT 7.2 6.6  ALBUMIN 3.4* 3.2*   No results for input(s): LIPASE, AMYLASE in the last 168 hours. No results for input(s): AMMONIA in the last 168 hours. Coagulation Profile: No results for input(s): INR, PROTIME in the last 168 hours. Cardiac Enzymes: No results for input(s): CKTOTAL, CKMB, CKMBINDEX, TROPONINI in the last 168  hours. BNP (last 3 results) No results for input(s): PROBNP in the last 8760 hours. HbA1C: No results for input(s): HGBA1C in the last 72 hours. CBG: Recent Labs  Lab 08/04/20 1123 08/04/20 1604 08/04/20 2123 08/05/20 0750 08/05/20 1137  GLUCAP 158* 138* 98 90 175*   Lipid Profile: No results for input(s): CHOL, HDL, LDLCALC, TRIG, CHOLHDL, LDLDIRECT in the last 72 hours. Thyroid Function Tests: Recent Labs    08/03/20 0034  TSH 1.789   Anemia Panel: Recent Labs    08/03/20 0034  VITAMINB12 315  FOLATE 16.7  FERRITIN 30  TIBC 340  IRON 47  RETICCTPCT 1.8   Sepsis Labs: No results for input(s): PROCALCITON, LATICACIDVEN in the last 168 hours.  Recent Results (from the past 240 hour(s))  SARS CORONAVIRUS 2 (TAT 6-24 HRS) Nasopharyngeal Nasopharyngeal Swab     Status: None   Collection Time: 07/31/20  3:16 PM   Specimen: Nasopharyngeal Swab  Result Value Ref Range Status   SARS Coronavirus 2 NEGATIVE NEGATIVE Final    Comment: (NOTE) SARS-CoV-2 target nucleic acids are NOT DETECTED.  The SARS-CoV-2 RNA is generally detectable in  upper and lower respiratory specimens during the acute phase of infection. Negative results do not preclude SARS-CoV-2 infection, do not rule out co-infections with other pathogens, and should not be used as the sole basis for treatment or other patient management decisions. Negative results must be combined with clinical observations, patient history, and epidemiological information. The expected result is Negative.  Fact Sheet for Patients: SugarRoll.be  Fact Sheet for Healthcare Providers: https://www.woods-mathews.com/  This test is not yet approved or cleared by the [place] FDA and  has been authorized for detection and/or diagnosis of SARS-CoV-2 by FDA under an Emergency Use Authorization (EUA). This EUA will remain  in effect (meaning this test can be used) for the duration of the COVID-19 declaration under Se ction 564(b)(1) of the Act, 21 U.S.C. section 360bbb-3(b)(1), unless the authorization is terminated or revoked sooner.  Performed at [facility], [place] [address], [place], [place] [address]          Radiology Studies: DG CHEST PORT 1 VIEW  Result Date: 08/05/2020 CLINICAL DATA:  Shortness of breath, COPD, hypertension EXAM: PORTABLE CHEST 1 VIEW COMPARISON:  08/02/2020 FINDINGS: Single frontal view of the chest demonstrates a stable cardiac silhouette. Background emphysema and bibasilar fibrosis are again noted unchanged. No airspace disease, effusion, or pneumothorax. No acute bony abnormalities. IMPRESSION: 1. Chronic background emphysema and pulmonary fibrosis. No acute airspace disease. Electronically Signed   By: Randa Ngo M.D.   On: 08/05/2020 15:39        Scheduled Meds: . amLODipine  10 mg Oral Daily  . arformoterol  15 mcg Nebulization BID  . budesonide (PULMICORT) nebulizer solution  0.25 mg Nebulization BID  . carvedilol  3.125 mg Oral BID WC  . Chlorhexidine Gluconate Cloth  6 each Topical  Daily  . cloNIDine  0.1 mg Oral BID  . docusate sodium  100 mg Oral BID  . doxazosin  4 mg Oral QHS  . ferrous sulfate  325 mg Oral Q breakfast  . furosemide      . guaiFENesin  600 mg Oral BID  . insulin aspart  0-9 Units Subcutaneous TID AC & HS  . revefenacin  175 mcg Nebulization Daily  . senna  1 tablet Oral BID  . sodium chloride flush  3 mL Intravenous Q12H  . vitamin B-12  1,000 mcg Oral Daily  Continuous Infusions: . sodium chloride       LOS: 3 days    Time spent: 35 minutes    Bonnell Public, MD [facility]   If 7PM-7AM, please contact night-coverage [email or web address]  08/05/2020, 4:25 PM

## 2020-08-05 NOTE — Plan of Care (Signed)
  Problem: Clinical Measurements: Goal: Diagnostic test results will improve Outcome: Progressing   Problem: Clinical Measurements: Goal: Respiratory complications will improve Outcome: Progressing   Problem: Clinical Measurements: Goal: Cardiovascular complication will be avoided Outcome: Progressing   Problem: Activity: Goal: Risk for activity intolerance will decrease Outcome: Progressing   Problem: Elimination: Goal: Will not experience complications related to bowel motility Outcome: Progressing   Problem: Elimination: Goal: Will not experience complications related to urinary retention Outcome: Progressing

## 2020-08-05 NOTE — Progress Notes (Signed)
Pt daughter [name] updated on plan of care for her farther and further work up on his breathing progression (still requiring a higher amount of 02 then he wears at home).

## 2020-08-06 ENCOUNTER — Inpatient Hospital Stay (HOSPITAL_COMMUNITY): Payer: Medicare HMO

## 2020-08-06 DIAGNOSIS — I5031 Acute diastolic (congestive) heart failure: Secondary | ICD-10-CM | POA: Diagnosis not present

## 2020-08-06 DIAGNOSIS — J9621 Acute and chronic respiratory failure with hypoxia: Secondary | ICD-10-CM | POA: Diagnosis not present

## 2020-08-06 LAB — ECHOCARDIOGRAM COMPLETE
Area-P 1/2: 3.72 cm2
Height: 68 in
S' Lateral: 2.8 cm
Weight: 2836 oz

## 2020-08-06 LAB — BASIC METABOLIC PANEL
Anion gap: 13 (ref 5–15)
BUN: 33 mg/dL — ABNORMAL HIGH (ref 8–23)
CO2: 25 mmol/L (ref 22–32)
Calcium: 8.9 mg/dL (ref 8.9–10.3)
Chloride: 101 mmol/L (ref 98–111)
Creatinine, Ser: 1.64 mg/dL — ABNORMAL HIGH (ref 0.61–1.24)
GFR, Estimated: 41 mL/min — ABNORMAL LOW (ref >60)
Glucose, Bld: 269 mg/dL — ABNORMAL HIGH (ref 70–99)
Potassium: 4.4 mmol/L (ref 3.5–5.1)
Sodium: 139 mmol/L (ref 135–145)

## 2020-08-06 LAB — GLUCOSE, CAPILLARY
Glucose-Capillary: 146 mg/dL — ABNORMAL HIGH (ref 70–99)
Glucose-Capillary: 219 mg/dL — ABNORMAL HIGH (ref 70–99)
Glucose-Capillary: 121 mg/dL — ABNORMAL HIGH (ref 70–99)
Glucose-Capillary: 139 mg/dL — ABNORMAL HIGH (ref 70–99)

## 2020-08-06 MED ORDER — POLYETHYLENE GLYCOL 3350 17 G PO PACK
17.0000 g | PACK | Freq: Every day | ORAL | Status: DC
  Administered 2020-08-07: 17 g via ORAL
  Filled 2020-08-06: qty 1

## 2020-08-06 MED ORDER — PREDNISONE 20 MG PO TABS
40.0000 mg | ORAL_TABLET | Freq: Every day | ORAL | Status: DC
  Administered 2020-08-07 – 2020-08-08 (×2): 40 mg via ORAL
  Filled 2020-08-06 (×2): qty 2

## 2020-08-06 MED ORDER — FUROSEMIDE 10 MG/ML IJ SOLN
60.0000 mg | Freq: Once | INTRAMUSCULAR | Status: AC
  Administered 2020-08-06: 60 mg via INTRAVENOUS
  Filled 2020-08-06: qty 6

## 2020-08-06 MED ORDER — TECHNETIUM TO 99M ALBUMIN AGGREGATED
4.4000 | Freq: Once | INTRAVENOUS | Status: AC | PRN
  Administered 2020-08-06: 4.4 via INTRAVENOUS

## 2020-08-06 NOTE — Progress Notes (Signed)
RN notified by [facility] needing clarification whether MD still wants VQ scan completed that was placed over the weekend. RN clarified with Dr. Tawanna Solo and MD does want VQ scan completed. RN notified Jan in [place].

## 2020-08-06 NOTE — Progress Notes (Signed)
Pt sitting up on side of bed for supper. Pt desatted to 70% on 4L [place]. RN increased pt to 6L and he recovered to 88-89% while on side of bed for supper. RN will report changes to night shift RN. Pt aware of plan.

## 2020-08-06 NOTE — Progress Notes (Signed)
  Echocardiogram 2D Echocardiogram has been performed.  Bradley Johns 08/06/2020, 4:19 PM

## 2020-08-06 NOTE — Progress Notes (Signed)
PT Cancellation Note  Patient Details Name: Bradley Johns MRN: [id number] DOB: 1934/08/30   Cancelled Treatment:    Reason Eval/Treat Not Completed: Patient at procedure or test/unavailable   Marva Hendryx,KATHrine E 08/06/2020, 11:15 AM 08/06/2020 Jannette Spanner PT, DPT Acute Rehabilitation Services Pager: [phone] Office: [phone]

## 2020-08-06 NOTE — Progress Notes (Addendum)
PROGRESS NOTE    Bradley Johns  DTO:[id number] DOB: 12-12-1934 DOA: 08/02/2020 PCP: Colon Branch, MD   Chief Complain: Shortness of breath  Brief Narrative: Patient is 86 year old male with history of hypertension, symptomatic anemia, pulmonary fibrosis, COPD on 3 L of oxygen at home, gout, CKD status 3B, history of PEA arrest  bladder cancer status post bladder mass removal/biopsy on 4/28,,who was admitted for acute on chronic hypoxic respiratory failure after the surgery.  He was also found to be severely hypertensive.  On baseline, he is on 2 L of oxygen per minute, requiring 5 to 6 L of oxygen.  Currently being treated for possible COPD exacerbation.  Assessment & Plan:   Active Problems:   Diabetes mellitus type 2 in nonobese (HCC)   Hyperlipidemia   COPD with emphysema, on O2   Pulmonary fibrosis (HCC)   Urothelial carcinoma of bladder (HCC)   Accelerated hypertension   Acute on chronic respiratory failure with hypoxia (HCC)   Iron deficiency anemia due to chronic blood loss   SOB (shortness of breath)   Acute on chronic hypoxic respiratory failure: Suspected to be from COPD with exacerbation, pulmonary fibrosis.  On 3 L of oxygen at home.  After surgery, he was requiring more oxygen.  Chest x-ray showed emphysema, chronic coarsening/fibrosis.  Continue bronchodilators, supplemental oxygen.  He was seen by pulmonology for preop evaluation on 06/14/2020, pulmonology had recommended budesonide twice daily, arformoterol twice daily, Yupelri.  Currently on 4-5 L of oxygen per minute.  Last chest x-ray showed emphysema, pulmonary fibrosis.    BNP was elevated.  Also given a dose of Lasix 40 mg IV once on 08/05/2020.  On IV steroids, changed to oral because he does not have any wheezes.  ABG on 5/1 showed hypoxia with PO2 46.  Plan for VQ scan to rule out PE. We hold  additional dose of IV Lasix , he had significant urine output during this hospitalization. Follow up echo.Elevated BNP If  there is no improvement in the oxygenation, will consider pulmonology evaluation.  Urothelial carcinoma bladder: Status post cystoscopy, transurethral resection of bladder tumor on 4/28 by urology.  Foley discontinued.  He needs to follow-up with urology as an outpatient.  Accelerated hypertension: Currently on Coreg, Cardura, Narvasc,clonidine. Blood pressure better controlled.  Monitor blood pressure.  Hyperlipidemia: Not on statin.  Diabetes type 2: Continue sliding scale insulin.  Monitor blood sugars.  History of iron deficiency anemia: Continue ferrous sulfate  Hypomagnesemia: Being monitored and supplemented.  CKD stage IIIb: Baseline creatinine ranges from 1.5-1.9.  Currently kidney function at baseline.  Constipation:   Continue bowel regimen  Debility/deconditioning: PT/OT consulted.            DVT prophylaxis:SCD Code Status: Full Family Communication: Called and discussed with daughter on phone on 08/06/20 Status is: Inpatient  Remains inpatient appropriate because:IV treatments appropriate due to intensity of illness or inability to take PO and Inpatient level of care appropriate due to severity of illness   Dispo: The patient is from: Home              Anticipated d/c is to: Home              Patient currently is not medically stable to d/c.   Difficult to place patient No   Consultants: Urology  Procedures:TURP  Antimicrobials:  Anti-infectives (From admission, onward)   Start     Dose/Rate Route Frequency Ordered Stop   08/02/20 1033  ceFAZolin (ANCEF) IVPB  2g/100 mL premix        2 g 200 mL/hr over 30 Minutes Intravenous 30 min pre-op 08/02/20 1033 08/02/20 1340   08/02/20 0000  cephALEXin (KEFLEX) 500 MG capsule        500 mg Oral 2 times daily 08/02/20 1423 08/05/20 2359      Subjective: Patient seen and examined at bedside this morning.  Hemodynamically stable.  On 4 L of oxygen per minute .  He says his breathing is better than yesterday.   Does not look volume overloaded but has crackles in bilateral bases  Objective: Vitals:   08/06/20 0511 08/06/20 0521 08/06/20 0639 08/06/20 0827  BP: (!) 134/45 (!) 147/55  (!) 130/55  Pulse: 65 64 70 79  Resp: (!) 21 (!) 21    Temp: 98.2 F (36.8 C)     TempSrc: Oral     SpO2: 94% 97% 93%   Weight:      Height:        Intake/Output Summary (Last 24 hours) at 08/06/2020 1001 Last data filed at 08/06/2020 0400 Gross per 24 hour  Intake 840 ml  Output 2725 ml  Net -1885 ml   Filed Weights   08/02/20 1041 08/02/20 2117  Weight: 81.4 kg 80.4 kg    Examination:  General exam: Appears calm and comfortable ,Not in distress,average built HEENT:PERRL,Oral mucosa moist, Ear/Nose normal on gross exam Respiratory system: Bilateral equal diminished air sounds on bases, crackles on bases  Cardiovascular system: S1 & S2 heard, RRR. No JVD, murmurs, rubs, gallops or clicks. No pedal edema. Gastrointestinal system: Abdomen is nondistended, soft and nontender. No organomegaly or masses felt. Normal bowel sounds heard. Central nervous system: Alert and oriented. No focal neurological deficits. Extremities: No edema, no clubbing ,no cyanosis Skin: No rashes, lesions or ulcers,no icterus ,no pallor   Data Reviewed: I have personally reviewed following labs and imaging studies  CBC: Recent Labs  Lab 08/02/20 2126 08/03/20 0034 08/04/20 0354  WBC 7.3 7.4 9.1  NEUTROABS 6.5 6.4  --   HGB 11.4* 10.6* 10.2*  HCT 36.6* 33.9* 32.9*  MCV 97.3 96.6 96.8  PLT 178 170 623   Basic Metabolic Panel: Recent Labs  Lab 08/02/20 2126 08/03/20 0034 08/04/20 0354 08/06/20 0848  NA 139 142 143 139  K 4.9 4.5 4.7 4.4  CL 107 107 107 101  CO2 24 26 30 25   GLUCOSE 169* 161* 92 269*  BUN 35* 35* 31* 33*  CREATININE 1.64* 1.54* 1.51* 1.64*  CALCIUM 9.0 9.1 9.1 8.9  MG  --  1.6*  --   --   PHOS  --  3.5  --   --    GFR: Estimated Creatinine Clearance: 33 mL/min (A) (by C-G formula based on SCr  of 1.64 mg/dL (H)). Liver Function Tests: Recent Labs  Lab 08/02/20 2126 08/03/20 0034  AST 28 27  ALT 14 13  ALKPHOS 64 62  BILITOT 0.4 0.5  PROT 7.2 6.6  ALBUMIN 3.4* 3.2*   No results for input(s): LIPASE, AMYLASE in the last 168 hours. No results for input(s): AMMONIA in the last 168 hours. Coagulation Profile: No results for input(s): INR, PROTIME in the last 168 hours. Cardiac Enzymes: No results for input(s): CKTOTAL, CKMB, CKMBINDEX, TROPONINI in the last 168 hours. BNP (last 3 results) No results for input(s): PROBNP in the last 8760 hours. HbA1C: No results for input(s): HGBA1C in the last 72 hours. CBG: Recent Labs  Lab 08/05/20 0750 08/05/20 1137  08/05/20 1629 08/05/20 2054 08/06/20 0739  GLUCAP 90 175* 162* 207* 146*   Lipid Profile: No results for input(s): CHOL, HDL, LDLCALC, TRIG, CHOLHDL, LDLDIRECT in the last 72 hours. Thyroid Function Tests: No results for input(s): TSH, T4TOTAL, FREET4, T3FREE, THYROIDAB in the last 72 hours. Anemia Panel: No results for input(s): VITAMINB12, FOLATE, FERRITIN, TIBC, IRON, RETICCTPCT in the last 72 hours. Sepsis Labs: No results for input(s): PROCALCITON, LATICACIDVEN in the last 168 hours.  Recent Results (from the past 240 hour(s))  SARS CORONAVIRUS 2 (TAT 6-24 HRS) Nasopharyngeal Nasopharyngeal Swab     Status: None   Collection Time: 07/31/20  3:16 PM   Specimen: Nasopharyngeal Swab  Result Value Ref Range Status   SARS Coronavirus 2 NEGATIVE NEGATIVE Final    Comment: (NOTE) SARS-CoV-2 target nucleic acids are NOT DETECTED.  The SARS-CoV-2 RNA is generally detectable in upper and lower respiratory specimens during the acute phase of infection. Negative results do not preclude SARS-CoV-2 infection, do not rule out co-infections with other pathogens, and should not be used as the sole basis for treatment or other patient management decisions. Negative results must be combined with clinical  observations, patient history, and epidemiological information. The expected result is Negative.  Fact Sheet for Patients: SugarRoll.be  Fact Sheet for Healthcare Providers: https://www.woods-mathews.com/  This test is not yet approved or cleared by the [place] FDA and  has been authorized for detection and/or diagnosis of SARS-CoV-2 by FDA under an Emergency Use Authorization (EUA). This EUA will remain  in effect (meaning this test can be used) for the duration of the COVID-19 declaration under Se ction 564(b)(1) of the Act, 21 U.S.C. section 360bbb-3(b)(1), unless the authorization is terminated or revoked sooner.  Performed at [facility], [place] [address] [place], [place] [address]          Radiology Studies: DG CHEST PORT 1 VIEW  Result Date: 08/05/2020 CLINICAL DATA:  Shortness of breath, COPD, hypertension EXAM: PORTABLE CHEST 1 VIEW COMPARISON:  08/02/2020 FINDINGS: Single frontal view of the chest demonstrates a stable cardiac silhouette. Background emphysema and bibasilar fibrosis are again noted unchanged. No airspace disease, effusion, or pneumothorax. No acute bony abnormalities. IMPRESSION: 1. Chronic background emphysema and pulmonary fibrosis. No acute airspace disease. Electronically Signed   By: Randa Ngo M.D.   On: 08/05/2020 15:39        Scheduled Meds: . amLODipine  10 mg Oral Daily  . arformoterol  15 mcg Nebulization BID  . budesonide (PULMICORT) nebulizer solution  0.25 mg Nebulization BID  . carvedilol  3.125 mg Oral BID WC  . cloNIDine  0.1 mg Oral BID  . docusate sodium  100 mg Oral BID  . doxazosin  4 mg Oral QHS  . ferrous sulfate  325 mg Oral Q breakfast  . guaiFENesin  600 mg Oral BID  . insulin aspart  0-9 Units Subcutaneous TID AC & HS  . methylPREDNISolone (SOLU-MEDROL) injection  40 mg Intravenous Q12H  . revefenacin  175 mcg Nebulization Daily  . senna  1 tablet Oral BID  .  sodium chloride flush  3 mL Intravenous Q12H  . vitamin B-12  1,000 mcg Oral Daily   Continuous Infusions: . sodium chloride       LOS: 4 days    Time spent: 35 mins.More than 50% of that time was spent in counseling and/or coordination of care.      Shelly Coss, MD [facility] P5/05/2020, 10:01 AM

## 2020-08-06 NOTE — Progress Notes (Signed)
Physical Therapy Treatment Patient Details Name: Bradley Johns MRN: [id number] DOB: 09/21/1934 Today's Date: 08/06/2020    History of Present Illness Bradley Johns is a 86 y.o. male admitted for Acute on chronic hypoxic respiratory failure: Suspected to be from COPD with exacerbation, pulmonary fibrosis.  Past medical history significant of HTN, bladder cancer, symptomatic anemia, pulmonary fibrosis, COPD, chronic respiratory failure with hypoxia on 3 L, gout, HLD HTN, DM2, CKD stage III, history of PEA arrest    PT Comments    Pt very agreeable to mobilize and denies dyspnea.  Pt reports using 3L O2 [place] at baseline.  Pt has pulse ox at home and states SPO2 reading in upper 70s at times during mobility however he has not increased his liters of oxygen.  Pt's saturations dropped during ambulation today on 6L O2 [place].  Discussed current oxygen saturations and liters with pt --also found below: "General Gait Details: verbal cues for safe use of RW, cues for pursed lip breathing, pt denies dyspnea, SpO2 dropped to mid70s on 6L HFNC, 79% upon return to room however improved to 92% on 7L with seated rest break and breathing for approx 3-4 minutes, able to return to 4.5 L at rest upon therapist leaving room"  RN also aware.    Follow Up Recommendations  No PT follow up     Equipment Recommendations  None recommended by PT    Recommendations for Other Services       Precautions / Restrictions Precautions Precautions: Fall Precaution Comments: monitor O2 sats    Mobility  Bed Mobility               General bed mobility comments: OOB in recliner    Transfers Overall transfer level: Needs assistance Equipment used: Rolling walker (2 wheeled) Transfers: Sit to/from Stand Sit to Stand: Supervision         General transfer comment: verbal cues for hand placement  Ambulation/Gait Ambulation/Gait assistance: Min guard Gait Distance (Feet): 200 Feet Assistive device: Rolling  walker (2 wheeled) Gait Pattern/deviations: Step-through pattern;Decreased stride length     General Gait Details: verbal cues for safe use of RW, cues for pursed lip breathing, pt denies dyspnea, SpO2 dropped to mid70s on 6L HFNC, 79% upon return to room however improved to 92% on 7L with seated rest break and breathing for approx 3-4 minutes, able to return to 4.5 L at rest upon therapist leaving room   Stairs             Wheelchair Mobility    Modified Rankin (Stroke Patients Only)       Balance                                            Cognition Arousal/Alertness: Awake/alert Behavior During Therapy: WFL for tasks assessed/performed Overall Cognitive Status: Within Functional Limits for tasks assessed                                        Exercises      General Comments        Pertinent Vitals/Pain Pain Assessment: No/denies pain    Home Living                      Prior Function  PT Goals (current goals can now be found in the care plan section) Progress towards PT goals: Progressing toward goals    Frequency    Min 3X/week      PT Plan Current plan remains appropriate    Co-evaluation              AM-PAC PT "6 Clicks" Mobility   Outcome Measure  Help needed turning from your back to your side while in a flat bed without using bedrails?: A Little Help needed moving from lying on your back to sitting on the side of a flat bed without using bedrails?: A Little Help needed moving to and from a bed to a chair (including a wheelchair)?: A Little Help needed standing up from a chair using your arms (e.g., wheelchair or bedside chair)?: A Little Help needed to walk in hospital room?: A Little Help needed climbing 3-5 steps with a railing? : A Little 6 Click Score: 18    End of Session Equipment Utilized During Treatment: Gait belt;Oxygen Activity Tolerance: Patient limited by  fatigue Patient left: in chair;with call bell/phone within reach Nurse Communication: Mobility status PT Visit Diagnosis: Difficulty in walking, not elsewhere classified (R26.2)     Time: 7341-9379 PT Time Calculation (min) (ACUTE ONLY): 20 min  Charges:  $Gait Training: 8-22 mins                    Arlyce Dice, DPT Acute Rehabilitation Services Pager: [phone] Office: [phone]  York Ram E 08/06/2020, 4:22 PM

## 2020-08-07 DIAGNOSIS — J9621 Acute and chronic respiratory failure with hypoxia: Secondary | ICD-10-CM | POA: Diagnosis not present

## 2020-08-07 DIAGNOSIS — J9601 Acute respiratory failure with hypoxia: Secondary | ICD-10-CM | POA: Diagnosis present

## 2020-08-07 LAB — CBC WITH DIFFERENTIAL/PLATELET
Abs Immature Granulocytes: 0.04 10*3/uL (ref 0.00–0.07)
Basophils Absolute: 0 10*3/uL (ref 0.0–0.1)
Basophils Relative: 0 %
Eosinophils Absolute: 0.1 10*3/uL (ref 0.0–0.5)
Eosinophils Relative: 1 %
HCT: 33.6 % — ABNORMAL LOW (ref 39.0–52.0)
Hemoglobin: 10.5 g/dL — ABNORMAL LOW (ref 13.0–17.0)
Immature Granulocytes: 0 %
Lymphocytes Relative: 17 %
Lymphs Abs: 1.6 10*3/uL (ref 0.7–4.0)
MCH: 29.8 pg (ref 26.0–34.0)
MCHC: 31.3 g/dL (ref 30.0–36.0)
MCV: 95.5 fL (ref 80.0–100.0)
Monocytes Absolute: 0.9 10*3/uL (ref 0.1–1.0)
Monocytes Relative: 10 %
Neutro Abs: 6.7 10*3/uL (ref 1.7–7.7)
Neutrophils Relative %: 72 %
Platelets: 212 10*3/uL (ref 150–400)
RBC: 3.52 MIL/uL — ABNORMAL LOW (ref 4.22–5.81)
RDW: 14.8 % (ref 11.5–15.5)
WBC: 9.3 10*3/uL (ref 4.0–10.5)
nRBC: 0 % (ref 0.0–0.2)

## 2020-08-07 LAB — BASIC METABOLIC PANEL
Anion gap: 10 (ref 5–15)
BUN: 38 mg/dL — ABNORMAL HIGH (ref 8–23)
CO2: 30 mmol/L (ref 22–32)
Calcium: 9.1 mg/dL (ref 8.9–10.3)
Chloride: 98 mmol/L (ref 98–111)
Creatinine, Ser: 1.53 mg/dL — ABNORMAL HIGH (ref 0.61–1.24)
GFR, Estimated: 45 mL/min — ABNORMAL LOW (ref >60)
Glucose, Bld: 100 mg/dL — ABNORMAL HIGH (ref 70–99)
Potassium: 3.8 mmol/L (ref 3.5–5.1)
Sodium: 138 mmol/L (ref 135–145)

## 2020-08-07 LAB — GLUCOSE, CAPILLARY
Glucose-Capillary: 100 mg/dL — ABNORMAL HIGH (ref 70–99)
Glucose-Capillary: 152 mg/dL — ABNORMAL HIGH (ref 70–99)
Glucose-Capillary: 197 mg/dL — ABNORMAL HIGH (ref 70–99)
Glucose-Capillary: 271 mg/dL — ABNORMAL HIGH (ref 70–99)

## 2020-08-07 LAB — HEMOGLOBIN A1C
Hgb A1c MFr Bld: 6.8 % — ABNORMAL HIGH (ref 4.8–5.6)
Mean Plasma Glucose: 148.46 mg/dL

## 2020-08-07 MED ORDER — POLYETHYLENE GLYCOL 3350 17 G PO PACK
17.0000 g | PACK | Freq: Two times a day (BID) | ORAL | Status: DC
  Administered 2020-08-07 – 2020-08-08 (×2): 17 g via ORAL
  Filled 2020-08-07 (×2): qty 1

## 2020-08-07 MED ORDER — FUROSEMIDE 10 MG/ML IJ SOLN
60.0000 mg | Freq: Once | INTRAMUSCULAR | Status: AC
  Administered 2020-08-07: 60 mg via INTRAVENOUS
  Filled 2020-08-07: qty 6

## 2020-08-07 MED ORDER — BISACODYL 10 MG RE SUPP
10.0000 mg | Freq: Once | RECTAL | Status: AC
  Administered 2020-08-07: 10 mg via RECTAL
  Filled 2020-08-07: qty 1

## 2020-08-07 NOTE — Progress Notes (Signed)
PROGRESS NOTE    Bradley Johns  [id number] DOB: 08-04-1934 DOA: 08/02/2020 PCP: Colon Branch, MD   Chief Complain: Shortness of breath  Brief Narrative: Patient is 86 year old male with history of hypertension, symptomatic anemia, pulmonary fibrosis, COPD on 3 L of oxygen at home, gout, CKD status 3B, history of PEA arrest  bladder cancer status post bladder mass removal/biopsy on 4/28,,who was admitted for acute on chronic hypoxic respiratory failure after the surgery.  He was also found to be severely hypertensive.  On baseline, he is on 2 L of oxygen per minute, requiring 5 to 6 L of oxygen.  Currently being treated for possible COPD exacerbation/volume overload.  Assessment & Plan:   Active Problems:   Diabetes mellitus type 2 in nonobese (HCC)   Hyperlipidemia   COPD with emphysema, on O2   Pulmonary fibrosis (HCC)   Urothelial carcinoma of bladder (HCC)   Accelerated hypertension   Acute on chronic respiratory failure with hypoxia (HCC)   Iron deficiency anemia due to chronic blood loss   SOB (shortness of breath)   Acute respiratory failure with hypoxia (HCC)   Acute on chronic hypoxic respiratory failure: Suspected to be from COPD with exacerbation, pulmonary fibrosis, acute on chronic diastolic congestive heart failure.  On 3 L of oxygen at home.  After surgery, he was requiring more oxygen.  Chest x-ray showed emphysema, chronic coarsening/fibrosis.  Continue bronchodilators, supplemental oxygen.  He was seen by pulmonology for preop evaluation on 06/14/2020, pulmonology had recommended budesonide twice daily, arformoterol twice daily, Yupelri.  Currently on 4 L of oxygen per minute.  Last chest x-ray showed emphysema, pulmonary fibrosis.    On IV steroids, changed to oral because he does not have any wheezes.  ABG on 5/1 showed hypoxia with PO2 46.   VQ scan negative for  PE. Echo showed EF of 60 to 123456, grade 1 diastolic dysfunction, mildly reduced right ventricular systolic  function.BNP was elevated.  On 60 mg of IV Lasix daily.  Oxygenation has improved.  Urothelial carcinoma bladder: Status post cystoscopy, transurethral resection of bladder tumor on 4/28 by urology.  Foley discontinued.  He needs to follow-up with urology as an outpatient.  Accelerated hypertension: Currently on Coreg, Cardura, Narvasc,clonidine. Blood pressure better controlled now.  Monitor blood pressure.  Hyperlipidemia: Not on statin.  Diabetes type 2: Continue sliding scale insulin.  Monitor blood sugars.  History of iron deficiency anemia: Continue ferrous sulfate  Hypomagnesemia: Being monitored and supplemented.  CKD stage IIIb: Baseline creatinine ranges from 1.5-1.9.  Currently kidney function at baseline.  Constipation:   Continue bowel regimen, given a dose of Dulcolax today  Debility/deconditioning: PT/OT consulted.            DVT prophylaxis:SCD Code Status: Full Family Communication: Called and discussed with daughter on phone on 08/06/20 Status is: Inpatient  Remains inpatient appropriate because:IV treatments appropriate due to intensity of illness or inability to take PO and Inpatient level of care appropriate due to severity of illness   Dispo: The patient is from: Home              Anticipated d/c is to: Home              Patient currently is not medically stable to d/c.   Difficult to place patient No   Consultants: Urology  Procedures:TURP  Antimicrobials:  Anti-infectives (From admission, onward)   Start     Dose/Rate Route Frequency Ordered Stop   08/02/20 1033  ceFAZolin (ANCEF) IVPB 2g/100 mL premix        2 g 200 mL/hr over 30 Minutes Intravenous 30 min pre-op 08/02/20 1033 08/02/20 1340   08/02/20 0000  cephALEXin (KEFLEX) 500 MG capsule        500 mg Oral 2 times daily 08/02/20 1423 08/05/20 2359      Subjective: Patient seen and examined the bedside this morning.  He was having difficult time with constipation.  He was on bedside  commode but unable to have bowel movement.  Oxygenation has improved.  On 4 L of oxygen without high flow.  He thinks he is almost ready to go home.  Objective: Vitals:   08/06/20 1724 08/06/20 1938 08/06/20 2013 08/07/20 0448  BP:   (!) 148/57 (!) 158/65  Pulse: 69  72 (!) 59  Resp:   16 16  Temp:   97.7 F (36.5 C) 97.7 F (36.5 C)  TempSrc:   Oral Oral  SpO2:  91% 90% 94%  Weight:      Height:        Intake/Output Summary (Last 24 hours) at 08/07/2020 0733 Last data filed at 08/07/2020 0615 Gross per 24 hour  Intake 360 ml  Output 2275 ml  Net -1915 ml   Filed Weights   08/02/20 1041 08/02/20 2117  Weight: 81.4 kg 80.4 kg    Examination:  General exam: Overall comfortable, not in distress, deconditioned HEENT: PERRL Respiratory system: Diminished air sounds on bilateral bases, few crackles on the bases Cardiovascular system: S1 & S2 heard, RRR.  Gastrointestinal system: Abdomen is nondistended, soft and nontender. Central nervous system: Alert and oriented Extremities: No edema, no clubbing ,no cyanosis Skin: No rashes, no ulcers,no icterus    Data Reviewed: I have personally reviewed following labs and imaging studies  CBC: Recent Labs  Lab 08/02/20 2126 08/03/20 0034 08/04/20 0354 08/07/20 0432  WBC 7.3 7.4 9.1 9.3  NEUTROABS 6.5 6.4  --  6.7  HGB 11.4* 10.6* 10.2* 10.5*  HCT 36.6* 33.9* 32.9* 33.6*  MCV 97.3 96.6 96.8 95.5  PLT 178 170 168 841   Basic Metabolic Panel: Recent Labs  Lab 08/02/20 2126 08/03/20 0034 08/04/20 0354 08/06/20 0848 08/07/20 0432  NA 139 142 143 139 138  K 4.9 4.5 4.7 4.4 3.8  CL 107 107 107 101 98  CO2 24 26 30 25 30   GLUCOSE 169* 161* 92 269* 100*  BUN 35* 35* 31* 33* 38*  CREATININE 1.64* 1.54* 1.51* 1.64* 1.53*  CALCIUM 9.0 9.1 9.1 8.9 9.1  MG  --  1.6*  --   --   --   PHOS  --  3.5  --   --   --    GFR: Estimated Creatinine Clearance: 35.4 mL/min (A) (by C-G formula based on SCr of 1.53 mg/dL (H)). Liver  Function Tests: Recent Labs  Lab 08/02/20 2126 08/03/20 0034  AST 28 27  ALT 14 13  ALKPHOS 64 62  BILITOT 0.4 0.5  PROT 7.2 6.6  ALBUMIN 3.4* 3.2*   No results for input(s): LIPASE, AMYLASE in the last 168 hours. No results for input(s): AMMONIA in the last 168 hours. Coagulation Profile: No results for input(s): INR, PROTIME in the last 168 hours. Cardiac Enzymes: No results for input(s): CKTOTAL, CKMB, CKMBINDEX, TROPONINI in the last 168 hours. BNP (last 3 results) No results for input(s): PROBNP in the last 8760 hours. HbA1C: No results for input(s): HGBA1C in the last 72 hours. CBG: Recent Labs  Lab 08/06/20 0739 08/06/20 1206 08/06/20 1644 08/06/20 2012 08/07/20 0732  GLUCAP 146* 219* 121* 139* 100*   Lipid Profile: No results for input(s): CHOL, HDL, LDLCALC, TRIG, CHOLHDL, LDLDIRECT in the last 72 hours. Thyroid Function Tests: No results for input(s): TSH, T4TOTAL, FREET4, T3FREE, THYROIDAB in the last 72 hours. Anemia Panel: No results for input(s): VITAMINB12, FOLATE, FERRITIN, TIBC, IRON, RETICCTPCT in the last 72 hours. Sepsis Labs: No results for input(s): PROCALCITON, LATICACIDVEN in the last 168 hours.  Recent Results (from the past 240 hour(s))  SARS CORONAVIRUS 2 (TAT 6-24 HRS) Nasopharyngeal Nasopharyngeal Swab     Status: None   Collection Time: 07/31/20  3:16 PM   Specimen: Nasopharyngeal Swab  Result Value Ref Range Status   SARS Coronavirus 2 NEGATIVE NEGATIVE Final    Comment: (NOTE) SARS-CoV-2 target nucleic acids are NOT DETECTED.  The SARS-CoV-2 RNA is generally detectable in upper and lower respiratory specimens during the acute phase of infection. Negative results do not preclude SARS-CoV-2 infection, do not rule out co-infections with other pathogens, and should not be used as the sole basis for treatment or other patient management decisions. Negative results must be combined with clinical observations, patient history, and  epidemiological information. The expected result is Negative.  Fact Sheet for Patients: SugarRoll.be  Fact Sheet for Healthcare Providers: https://www.woods-mathews.com/  This test is not yet approved or cleared by the [place] FDA and  has been authorized for detection and/or diagnosis of SARS-CoV-2 by FDA under an Emergency Use Authorization (EUA). This EUA will remain  in effect (meaning this test can be used) for the duration of the COVID-19 declaration under Se ction 564(b)(1) of the Act, 21 U.S.C. section 360bbb-3(b)(1), unless the authorization is terminated or revoked sooner.  Performed at [facility], [place] [address], [place], [place] [address]          Radiology Studies: NM Pulmonary Perfusion  Result Date: 08/06/2020 CLINICAL DATA:  Dyspnea on exertion. History of emphysema and pulmonary fibrosis. EXAM: NUCLEAR MEDICINE PERFUSION LUNG SCAN TECHNIQUE: Perfusion images were obtained in multiple projections after intravenous injection of radiopharmaceutical. Ventilation scans intentionally deferred if perfusion scan and chest x-ray adequate for interpretation during COVID 19 epidemic. RADIOPHARMACEUTICALS:  4.4 mCi Tc-64m MAA IV COMPARISON:  Single-view of the chest 08/05/2020. CT chest 06/09/2018 FINDINGS: No segmental or subsegmental defect is identified. IMPRESSION: Negative for pulmonary embolus. Electronically Signed   By: Inge Rise M.D.   On: 08/06/2020 12:43   DG CHEST PORT 1 VIEW  Result Date: 08/05/2020 CLINICAL DATA:  Shortness of breath, COPD, hypertension EXAM: PORTABLE CHEST 1 VIEW COMPARISON:  08/02/2020 FINDINGS: Single frontal view of the chest demonstrates a stable cardiac silhouette. Background emphysema and bibasilar fibrosis are again noted unchanged. No airspace disease, effusion, or pneumothorax. No acute bony abnormalities. IMPRESSION: 1. Chronic background emphysema and pulmonary fibrosis. No  acute airspace disease. Electronically Signed   By: Randa Ngo M.D.   On: 08/05/2020 15:39   ECHOCARDIOGRAM COMPLETE  Result Date: 08/06/2020    ECHOCARDIOGRAM REPORT   Patient Name:   Bradley Johns Date of Exam: 08/06/2020 Medical Rec #:  [id number]       Height:       68.0 in Accession #:    [id number]      Weight:       177.2 lb Date of Birth:  1934/12/26        BSA:          1.941 m  Patient Age:    77 years        BP:           130/55 mmHg Patient Gender: M               HR:           68 bpm. Exam Location:  Inpatient Procedure: 2D Echo, Cardiac Doppler and Color Doppler Indications:    CHF-Acute Diastolic [id number]  History:        Patient has prior history of Echocardiogram examinations, most                 recent 04/01/2020. COPD; Risk Factors:Hypertension, Dyslipidemia                 and Diabetes.  Sonographer:    Bernadene Person RDCS Referring Phys: [id number] Derryck Shahan IMPRESSIONS  1. Left ventricular ejection fraction, by estimation, is 60 to 65%. The left ventricle has normal function. The left ventricle has no regional wall motion abnormalities. There is mild left ventricular hypertrophy. Left ventricular diastolic parameters are consistent with Grade I diastolic dysfunction (impaired relaxation).  2. Right ventricular systolic function is mildly reduced. The right ventricular size is moderately enlarged. There is normal pulmonary artery systolic pressure. The estimated right ventricular systolic pressure is 0000000 mmHg.  3. The mitral valve is normal in structure. Trivial mitral valve regurgitation. No evidence of mitral stenosis.  4. The aortic valve is tricuspid. Aortic valve regurgitation is not visualized. Mild aortic valve sclerosis is present, with no evidence of aortic valve stenosis.  5. The inferior vena cava is normal in size with greater than 50% respiratory variability, suggesting right atrial pressure of 3 mmHg. FINDINGS  Left Ventricle: Left ventricular ejection fraction, by  estimation, is 60 to 65%. The left ventricle has normal function. The left ventricle has no regional wall motion abnormalities. The left ventricular internal cavity size was normal in size. There is  mild left ventricular hypertrophy. Left ventricular diastolic parameters are consistent with Grade I diastolic dysfunction (impaired relaxation). Right Ventricle: The right ventricular size is moderately enlarged. No increase in right ventricular wall thickness. Right ventricular systolic function is mildly reduced. There is normal pulmonary artery systolic pressure. The tricuspid regurgitant velocity is 2.08 m/s, and with an assumed right atrial pressure of 3 mmHg, the estimated right ventricular systolic pressure is 0000000 mmHg. Left Atrium: Left atrial size was normal in size. Right Atrium: Right atrial size was normal in size. Pericardium: Trivial pericardial effusion is present. Mitral Valve: The mitral valve is normal in structure. There is mild calcification of the mitral valve leaflet(s). Mild mitral annular calcification. Trivial mitral valve regurgitation. No evidence of mitral valve stenosis. Tricuspid Valve: The tricuspid valve is normal in structure. Tricuspid valve regurgitation is trivial. Aortic Valve: The aortic valve is tricuspid. Aortic valve regurgitation is not visualized. Mild aortic valve sclerosis is present, with no evidence of aortic valve stenosis. Pulmonic Valve: The pulmonic valve was normal in structure. Pulmonic valve regurgitation is trivial. Aorta: The aortic root is normal in size and structure. Venous: The inferior vena cava is normal in size with greater than 50% respiratory variability, suggesting right atrial pressure of 3 mmHg. IAS/Shunts: No atrial level shunt detected by color flow Doppler.  LEFT VENTRICLE PLAX 2D LVIDd:         4.40 cm  Diastology LVIDs:         2.80 cm  LV e' medial:    7.95 cm/s LV PW:  1.10 cm  LV E/e' medial:  10.3 LV IVS:        1.20 cm  LV e' lateral:    7.14 cm/s LVOT diam:     1.90 cm  LV E/e' lateral: 11.5 LV SV:         63 LV SV Index:   33 LVOT Area:     2.84 cm  RIGHT VENTRICLE RV S prime:     11.40 cm/s TAPSE (M-mode): 1.7 cm LEFT ATRIUM             Index       RIGHT ATRIUM           Index LA diam:        3.40 cm 1.75 cm/m  RA Area:     11.70 cm LA Vol (A2C):   49.8 ml 25.65 ml/m RA Volume:   24.10 ml  12.41 ml/m LA Vol (A4C):   52.4 ml 26.99 ml/m LA Biplane Vol: 53.2 ml 27.40 ml/m  AORTIC VALVE LVOT Vmax:   101.00 cm/s LVOT Vmean:  64.400 cm/s LVOT VTI:    0.223 m  AORTA Ao Root diam: 3.10 cm Ao Asc diam:  3.00 cm MITRAL VALVE               TRICUSPID VALVE MV Area (PHT): 3.72 cm    TR Peak grad:   17.3 mmHg MV Decel Time: 204 msec    TR Vmax:        208.00 cm/s MV E velocity: 81.80 cm/s MV A velocity: 79.70 cm/s  SHUNTS MV E/A ratio:  1.03        Systemic VTI:  0.22 m                            Systemic Diam: 1.90 cm Loralie Champagne MD Electronically signed by Loralie Champagne MD Signature Date/Time: 08/06/2020/4:37:17 PM    Final         Scheduled Meds: . amLODipine  10 mg Oral Daily  . arformoterol  15 mcg Nebulization BID  . budesonide (PULMICORT) nebulizer solution  0.25 mg Nebulization BID  . carvedilol  3.125 mg Oral BID WC  . cloNIDine  0.1 mg Oral BID  . docusate sodium  100 mg Oral BID  . doxazosin  4 mg Oral QHS  . ferrous sulfate  325 mg Oral Q breakfast  . guaiFENesin  600 mg Oral BID  . insulin aspart  0-9 Units Subcutaneous TID AC & HS  . polyethylene glycol  17 g Oral Daily  . predniSONE  40 mg Oral Q breakfast  . revefenacin  175 mcg Nebulization Daily  . senna  1 tablet Oral BID  . sodium chloride flush  3 mL Intravenous Q12H  . vitamin B-12  1,000 mcg Oral Daily   Continuous Infusions: . sodium chloride       LOS: 5 days    Time spent: 25 mins.More than 50% of that time was spent in counseling and/or coordination of care.      Shelly Coss, MD [facility] P5/06/2020, 7:33 AM

## 2020-08-07 NOTE — Progress Notes (Signed)
OT Cancellation Note  Patient Details Name: Bradley Johns MRN: [id number] DOB: [birth year]   Cancelled Treatment:    Reason Eval/Treat Not Completed: OT screened, no needs identified, will sign off.  OT order received, OT evaluation completed on 4/29 with report of patient at baseline with self care tasks and no acute OT needs at this time. Please see eval on 4/29 for further detail.   Delbert Phenix OT OT pager: [phone] 08/07/2020, 6:26 AM

## 2020-08-08 DIAGNOSIS — J9621 Acute and chronic respiratory failure with hypoxia: Secondary | ICD-10-CM | POA: Diagnosis not present

## 2020-08-08 LAB — BASIC METABOLIC PANEL
Anion gap: 9 (ref 5–15)
BUN: 44 mg/dL — ABNORMAL HIGH (ref 8–23)
CO2: 31 mmol/L (ref 22–32)
Calcium: 8.8 mg/dL — ABNORMAL LOW (ref 8.9–10.3)
Chloride: 99 mmol/L (ref 98–111)
Creatinine, Ser: 1.57 mg/dL — ABNORMAL HIGH (ref 0.61–1.24)
GFR, Estimated: 43 mL/min — ABNORMAL LOW (ref >60)
Glucose, Bld: 82 mg/dL (ref 70–99)
Potassium: 4.3 mmol/L (ref 3.5–5.1)
Sodium: 139 mmol/L (ref 135–145)

## 2020-08-08 LAB — GLUCOSE, CAPILLARY
Glucose-Capillary: 100 mg/dL — ABNORMAL HIGH (ref 70–99)
Glucose-Capillary: 135 mg/dL — ABNORMAL HIGH (ref 70–99)
Glucose-Capillary: 176 mg/dL — ABNORMAL HIGH (ref 70–99)
Glucose-Capillary: 86 mg/dL (ref 70–99)

## 2020-08-08 MED ORDER — POLYETHYLENE GLYCOL 3350 17 G PO PACK: 17.0000 g | PACK | Freq: Every day | ORAL | 0 refills | Status: DC

## 2020-08-08 MED ORDER — MOMETASONE FURO-FORMOTEROL FUM 100-5 MCG/ACT IN AERO
2.0000 | INHALATION_SPRAY | Freq: Two times a day (BID) | RESPIRATORY_TRACT | Status: DC
  Filled 2020-08-08: qty 8.8

## 2020-08-08 MED ORDER — ALBUTEROL SULFATE HFA 108 (90 BASE) MCG/ACT IN AERS: 2.0000 | INHALATION_SPRAY | RESPIRATORY_TRACT | 1 refills | Status: AC | PRN

## 2020-08-08 MED ORDER — GUAIFENESIN ER 600 MG PO TB12: 600.0000 mg | ORAL_TABLET | Freq: Two times a day (BID) | ORAL | 0 refills | Status: AC

## 2020-08-08 MED ORDER — IPRATROPIUM-ALBUTEROL 0.5-2.5 (3) MG/3ML IN SOLN
3.0000 mL | Freq: Four times a day (QID) | RESPIRATORY_TRACT | 0 refills | Status: DC | PRN
Start: 2020-08-08 — End: 2020-11-01

## 2020-08-08 MED ORDER — FUROSEMIDE 20 MG PO TABS: 20.0000 mg | ORAL_TABLET | Freq: Every day | ORAL | 1 refills | Status: DC

## 2020-08-08 MED ORDER — MOMETASONE FURO-FORMOTEROL FUM 100-5 MCG/ACT IN AERO: 2.0000 | INHALATION_SPRAY | Freq: Two times a day (BID) | RESPIRATORY_TRACT | 1 refills | Status: DC

## 2020-08-08 MED ORDER — AMLODIPINE BESYLATE 10 MG PO TABS: 10.0000 mg | ORAL_TABLET | Freq: Every day | ORAL | 0 refills | Status: DC

## 2020-08-08 MED ORDER — ALBUTEROL SULFATE HFA 108 (90 BASE) MCG/ACT IN AERS
2.0000 | INHALATION_SPRAY | RESPIRATORY_TRACT | Status: DC | PRN
  Filled 2020-08-08: qty 6.7

## 2020-08-08 MED ORDER — CLONIDINE HCL 0.1 MG PO TABS: 0.1000 mg | ORAL_TABLET | Freq: Two times a day (BID) | ORAL | 0 refills | Status: DC

## 2020-08-08 MED ORDER — SENNA 8.6 MG PO TABS: 1.0000 | ORAL_TABLET | Freq: Two times a day (BID) | ORAL | 0 refills | Status: AC

## 2020-08-08 MED ORDER — PREDNISONE 20 MG PO TABS: 40.0000 mg | ORAL_TABLET | Freq: Every day | ORAL | 0 refills | Status: AC

## 2020-08-08 NOTE — Care Management Important Message (Signed)
Important Message  Patient Details IM Letter given to the Patient. Name: Bradley Johns MRN: [id number] Date of Birth: [birth year]   Medicare Important Message Given:  Yes     [name] 08/08/2020, 12:00 PM

## 2020-08-08 NOTE — Discharge Summary (Signed)
Physician Discharge Summary  PETERSON MATHEY ACZ:[id number] DOB: [birth year] DOA: 08/02/2020  PCP: Colon Branch, MD  Admit date: 08/02/2020 Discharge date: 08/08/2020  Admitted From: Home Disposition:  Home  Discharge Condition:Stable CODE STATUS:FULL Diet recommendation: Heart Healthy   Brief/Interim Summary:  Patient is 86 year old male with history of hypertension, symptomatic anemia, pulmonary fibrosis, COPD on 3 L of oxygen at home, gout, CKD status 3B, history of PEA arrest  ,bladder cancer status post bladder mass removal/biopsy on 4/28,,who was admitted for acute on chronic hypoxic respiratory failure after the surgery.  He was also found to be severely hypertensive.  On baseline, he is on 2 L of oxygen per minute, required 5 to 6 L of oxygen.  He was  treated for possible COPD exacerbation/volume overload.  Currently respiratory status has improved with bronchodilators, steroids and diuretics.  He is requiring 3 to 4 L of oxygen per minute which is close to his baseline.  He is medically stable for discharge home today.  He needs to follow-up with pulmonology, PCP and urology as an outpatient.  Following problems were addressed during his hospitalization:  Acute on chronic hypoxic respiratory failure: Suspected to be from COPD with exacerbation, pulmonary fibrosis, acute on chronic diastolic congestive heart failure.  On 3 L of oxygen at home.  After surgery, he was requiring more oxygen.  Chest x-ray showed emphysema, chronic coarsening/fibrosis.  Continue bronchodilators, supplemental oxygen.  He was seen by pulmonology for preop evaluation on 06/14/2020, pulmonology had recommended budesonide twice daily, arformoterol twice daily, Yupelri.  Currently on 4 L of oxygen per minute.  Last chest x-ray showed emphysema, pulmonary fibrosis.    On IV steroids, changed to oral because he does not have any wheezes.  ABG on 5/1 showed hypoxia with PO2 46.   VQ scan negative for  PE. Echo showed EF of 60  to 10%, grade 1 diastolic dysfunction, mildly reduced right ventricular systolic function.BNP was elevated.  He was on IV lasix ,now changed to oral.  Oxygenation has improved. We will continue DuoNeb nebulization, Dulera, albuterol inhaler, oral steroids at home.  He needs to follow-up with his pulmonologist as an outpatient in 1 to 2 weeks.  Urothelial carcinoma bladder: Status post cystoscopy, transurethral resection of bladder tumor on 4/28 by urology.  Foley discontinued.  He needs to follow-up with urology as an outpatient.  Accelerated hypertension: Currently on Coreg, Cardura, Narvasc,clonidine. Blood pressure better controlled now.  Monitor blood pressure at home  Hyperlipidemia: Not on statin.  Diabetes type 2: Continue home regimen  History of iron deficiency anemia: Continue ferrous sulfate  CKD stage IIIb: Baseline creatinine ranges from 1.5-1.9.  Currently kidney function at baseline.Check BMP in a week  Constipation:   Continue bowel regimen  Debility/deconditioning: PT/OT consulted,no follow up recommended     Discharge Diagnoses:  Active Problems:   Diabetes mellitus type 2 in nonobese (HCC)   Hyperlipidemia   COPD with emphysema, on O2   Pulmonary fibrosis (HCC)   Urothelial carcinoma of bladder (HCC)   Accelerated hypertension   Acute on chronic respiratory failure with hypoxia (HCC)   Iron deficiency anemia due to chronic blood loss   SOB (shortness of breath)   Acute respiratory failure with hypoxia [facility])    Discharge Instructions  Discharge Instructions    Diet - low sodium heart healthy   Complete by: As directed    Discharge instructions   Complete by: As directed    1)Please take prescribed medications as instructed  2)Follow up with your PCP in a week.  Do a BMP test during the follow-up 3)Follow up with your pulmonologist in 1 to 2 weeks. 4)Follow up with urology.  Name and number of the provider has been attached.  Appointment has  been fixed.   Increase activity slowly   Complete by: As directed      Allergies as of 08/08/2020   No Known Allergies     Medication List    TAKE these medications   albuterol 108 (90 Base) MCG/ACT inhaler Commonly known as: VENTOLIN HFA Inhale 2 puffs into the lungs every 4 (four) hours as needed for wheezing or shortness of breath.   amLODipine 10 MG tablet Commonly known as: NORVASC Take 1 tablet (10 mg total) by mouth daily. Start taking on: Aug 09, 2020   atorvastatin 80 MG tablet Commonly known as: LIPITOR Take 1 tablet (80 mg total) by mouth daily. What changed: when to take this   carvedilol 3.125 MG tablet Commonly known as: COREG Take 1 tablet (3.125 mg total) by mouth 2 (two) times daily with a meal.   cloNIDine 0.1 MG tablet Commonly known as: CATAPRES Take 1 tablet (0.1 mg total) by mouth 2 (two) times daily.   colchicine 0.6 MG tablet Take 1 tablet (0.6 mg total) by mouth 2 (two) times daily as needed.   doxazosin 4 MG tablet Commonly known as: CARDURA Take 1 tablet (4 mg total) by mouth at bedtime.   ferrous sulfate 325 (65 FE) MG tablet Take 1 tablet (325 mg total) by mouth daily with breakfast.   furosemide 20 MG tablet Commonly known as: Lasix Take 1 tablet (20 mg total) by mouth daily.   guaiFENesin 600 MG 12 hr tablet Commonly known as: MUCINEX Take 1 tablet (600 mg total) by mouth 2 (two) times daily for 5 days.   ipratropium-albuterol 0.5-2.5 (3) MG/3ML Soln Commonly known as: DUONEB Take 3 mLs by nebulization every 6 (six) hours as needed.   ketoconazole 2 % cream Commonly known as: NIZORAL Apply 1 application topically daily. What changed:   when to take this  reasons to take this   mometasone-formoterol 100-5 MCG/ACT Aero Commonly known as: DULERA Inhale 2 puffs into the lungs 2 (two) times daily.   multivitamin tablet Take 1 tablet by mouth daily.   OXYGEN Inhale 3 L into the lungs continuous.   pioglitazone 45 MG  tablet Commonly known as: ACTOS Take 45 mg by mouth daily.   polyethylene glycol 17 g packet Commonly known as: MIRALAX / GLYCOLAX Take 17 g by mouth daily.   predniSONE 20 MG tablet Commonly known as: DELTASONE Take 2 tablets (40 mg total) by mouth daily with breakfast for 3 days. Start taking on: Aug 09, 2020   saline Gel Place 1 application into both nostrils every 12 (twelve) hours.   sodium chloride 0.65 % Soln nasal spray Commonly known as: OCEAN Place 1 spray into both nostrils as needed for congestion.   senna 8.6 MG Tabs tablet Commonly known as: SENOKOT Take 1 tablet (8.6 mg total) by mouth 2 (two) times daily.       Follow-up Information    Franchot Gallo, MD Follow up.   Specialty: Urology Why: 6.3.2022 @ 3:30 Contact information: [place] [address] [phone]        Colon Branch, MD. Schedule an appointment as soon as possible for a visit in 1 week(s).   Specialty: Internal Medicine Contact information: [address] [place] [address] [phone]        Hunsucker, Bonna Gains, MD. Schedule an appointment as soon as possible for a visit in 1 week(s).   Specialty: Pulmonary Disease Contact information: [address] [place] [address] [phone]              No Known Allergies  Consultations:  Urology   Procedures/Studies: NM Pulmonary Perfusion  Result Date: 08/06/2020 CLINICAL DATA:  Dyspnea on exertion. History of emphysema and pulmonary fibrosis. EXAM: NUCLEAR MEDICINE PERFUSION LUNG SCAN TECHNIQUE: Perfusion images were obtained in multiple projections after intravenous injection of radiopharmaceutical. Ventilation scans intentionally deferred if perfusion scan and chest x-ray adequate for interpretation during COVID 19 epidemic. RADIOPHARMACEUTICALS:  4.4 mCi Tc-73m MAA IV COMPARISON:  Single-view of the chest 08/05/2020. CT chest 06/09/2018 FINDINGS: No segmental or  subsegmental defect is identified. IMPRESSION: Negative for pulmonary embolus. Electronically Signed   By: Inge Rise M.D.   On: 08/06/2020 12:43   DG CHEST PORT 1 VIEW  Result Date: 08/05/2020 CLINICAL DATA:  Shortness of breath, COPD, hypertension EXAM: PORTABLE CHEST 1 VIEW COMPARISON:  08/02/2020 FINDINGS: Single frontal view of the chest demonstrates a stable cardiac silhouette. Background emphysema and bibasilar fibrosis are again noted unchanged. No airspace disease, effusion, or pneumothorax. No acute bony abnormalities. IMPRESSION: 1. Chronic background emphysema and pulmonary fibrosis. No acute airspace disease. Electronically Signed   By: Randa Ngo M.D.   On: 08/05/2020 15:39   DG CHEST PORT 1 VIEW  Result Date: 08/02/2020 CLINICAL DATA:  86 year old male with hypoxia EXAM: PORTABLE CHEST 1 VIEW COMPARISON:  Chest radiograph dated 04/03/2020 FINDINGS: Dated there is background of emphysema and diffuse chronic interstitial coarsening. Bilateral mid to lower lung field densities, likely chronic. No focal consolidation, pleural effusion or pneumothorax. Mild cardiomegaly. Atherosclerotic calcification of the aorta. No acute osseous pathology. IMPRESSION: 1. No acute cardiopulmonary process. 2. Emphysema and chronic interstitial coarsening/fibrosis. Electronically Signed   By: Anner Crete M.D.   On: 08/02/2020 20:05   ECHOCARDIOGRAM COMPLETE  Result Date: 08/06/2020    ECHOCARDIOGRAM REPORT   Patient Name:   JOBIN MONTELONGO Date of Exam: 08/06/2020 Medical Rec #:  [id number]       Height:       68.0 in Accession #:    [id number]      Weight:       177.2 lb Date of Birth:  [birth year]        BSA:          1.941 m Patient Age:    72 years        BP:           130/55 mmHg Patient Gender: M               HR:           68 bpm. Exam Location:  Inpatient Procedure: 2D Echo, Cardiac Doppler and Color Doppler Indications:    CHF-Acute Diastolic V89.38  History:        Patient has prior history of  Echocardiogram examinations, most                 recent 04/01/2020. COPD; Risk Factors:Hypertension, Dyslipidemia                 and Diabetes.  Sonographer:    Bernadene Person RDCS Referring Phys: [id number] Kloee Ballew IMPRESSIONS  1. Left ventricular ejection fraction, by estimation, is 60 to  65%. The left ventricle has normal function. The left ventricle has no regional wall motion abnormalities. There is mild left ventricular hypertrophy. Left ventricular diastolic parameters are consistent with Grade I diastolic dysfunction (impaired relaxation).  2. Right ventricular systolic function is mildly reduced. The right ventricular size is moderately enlarged. There is normal pulmonary artery systolic pressure. The estimated right ventricular systolic pressure is 20.3 mmHg.  3. The mitral valve is normal in structure. Trivial mitral valve regurgitation. No evidence of mitral stenosis.  4. The aortic valve is tricuspid. Aortic valve regurgitation is not visualized. Mild aortic valve sclerosis is present, with no evidence of aortic valve stenosis.  5. The inferior vena cava is normal in size with greater than 50% respiratory variability, suggesting right atrial pressure of 3 mmHg. FINDINGS  Left Ventricle: Left ventricular ejection fraction, by estimation, is 60 to 65%. The left ventricle has normal function. The left ventricle has no regional wall motion abnormalities. The left ventricular internal cavity size was normal in size. There is  mild left ventricular hypertrophy. Left ventricular diastolic parameters are consistent with Grade I diastolic dysfunction (impaired relaxation). Right Ventricle: The right ventricular size is moderately enlarged. No increase in right ventricular wall thickness. Right ventricular systolic function is mildly reduced. There is normal pulmonary artery systolic pressure. The tricuspid regurgitant velocity is 2.08 m/s, and with an assumed right atrial pressure of 3 mmHg, the estimated  right ventricular systolic pressure is 20.3 mmHg. Left Atrium: Left atrial size was normal in size. Right Atrium: Right atrial size was normal in size. Pericardium: Trivial pericardial effusion is present. Mitral Valve: The mitral valve is normal in structure. There is mild calcification of the mitral valve leaflet(s). Mild mitral annular calcification. Trivial mitral valve regurgitation. No evidence of mitral valve stenosis. Tricuspid Valve: The tricuspid valve is normal in structure. Tricuspid valve regurgitation is trivial. Aortic Valve: The aortic valve is tricuspid. Aortic valve regurgitation is not visualized. Mild aortic valve sclerosis is present, with no evidence of aortic valve stenosis. Pulmonic Valve: The pulmonic valve was normal in structure. Pulmonic valve regurgitation is trivial. Aorta: The aortic root is normal in size and structure. Venous: The inferior vena cava is normal in size with greater than 50% respiratory variability, suggesting right atrial pressure of 3 mmHg. IAS/Shunts: No atrial level shunt detected by color flow Doppler.  LEFT VENTRICLE PLAX 2D LVIDd:         4.40 cm  Diastology LVIDs:         2.80 cm  LV e' medial:    7.95 cm/s LV PW:         1.10 cm  LV E/e' medial:  10.3 LV IVS:        1.20 cm  LV e' lateral:   7.14 cm/s LVOT diam:     1.90 cm  LV E/e' lateral: 11.5 LV SV:         63 LV SV Index:   33 LVOT Area:     2.84 cm  RIGHT VENTRICLE RV S prime:     11.40 cm/s TAPSE (M-mode): 1.7 cm LEFT ATRIUM             Index       RIGHT ATRIUM           Index LA diam:        3.40 cm 1.75 cm/m  RA Area:     11.70 cm LA Vol (A2C):   49.8 ml 25.65 ml/m RA Volume:  24.10 ml  12.41 ml/m LA Vol (A4C):   52.4 ml 26.99 ml/m LA Biplane Vol: 53.2 ml 27.40 ml/m  AORTIC VALVE LVOT Vmax:   101.00 cm/s LVOT Vmean:  64.400 cm/s LVOT VTI:    0.223 m  AORTA Ao Root diam: 3.10 cm Ao Asc diam:  3.00 cm MITRAL VALVE               TRICUSPID VALVE MV Area (PHT): 3.72 cm    TR Peak grad:   17.3 mmHg  MV Decel Time: 204 msec    TR Vmax:        208.00 cm/s MV E velocity: 81.80 cm/s MV A velocity: 79.70 cm/s  SHUNTS MV E/A ratio:  1.03        Systemic VTI:  0.22 m                            Systemic Diam: 1.90 cm Loralie Champagne MD Electronically signed by Loralie Champagne MD Signature Date/Time: 08/06/2020/4:37:17 PM    Final        Subjective:  Patient seen and examined at the bedside this morning.  Hemodynamically stable for discharge today.  Discharge Exam: Vitals:   08/08/20 0357 08/08/20 0836  BP: (!) 162/70 135/84  Pulse: (!) 59 81  Resp: 20   Temp: 97.7 F (36.5 C)   SpO2: 91%    Vitals:   08/07/20 2035 08/07/20 2221 08/08/20 0357 08/08/20 0836  BP: (!) 120/55 123/66 (!) 162/70 135/84  Pulse: 72 70 (!) 59 81  Resp: 20  20   Temp: 97.6 F (36.4 C)  97.7 F (36.5 C)   TempSrc: Oral     SpO2: 91%  91%   Weight:      Height:        General: Pt is alert, awake, not in acute distress Cardiovascular: RRR, S1/S2 +, no rubs, no gallops Respiratory: CTA bilaterally, no wheezing, no rhonchi Abdominal: Soft, NT, ND, bowel sounds + Extremities: no edema, no cyanosis    The results of significant diagnostics from this hospitalization (including imaging, microbiology, ancillary and laboratory) are listed below for reference.     Microbiology: Recent Results (from the past 240 hour(s))  SARS CORONAVIRUS 2 (TAT 6-24 HRS) Nasopharyngeal Nasopharyngeal Swab     Status: None   Collection Time: 07/31/20  3:16 PM   Specimen: Nasopharyngeal Swab  Result Value Ref Range Status   SARS Coronavirus 2 NEGATIVE NEGATIVE Final    Comment: (NOTE) SARS-CoV-2 target nucleic acids are NOT DETECTED.  The SARS-CoV-2 RNA is generally detectable in upper and lower respiratory specimens during the acute phase of infection. Negative results do not preclude SARS-CoV-2 infection, do not rule out co-infections with other pathogens, and should not be used as the sole basis for treatment or other  patient management decisions. Negative results must be combined with clinical observations, patient history, and epidemiological information. The expected result is Negative.  Fact Sheet for Patients: SugarRoll.be  Fact Sheet for Healthcare Providers: https://www.woods-mathews.com/  This test is not yet approved or cleared by the [place] FDA and  has been authorized for detection and/or diagnosis of SARS-CoV-2 by FDA under an Emergency Use Authorization (EUA). This EUA will remain  in effect (meaning this test can be used) for the duration of the COVID-19 declaration under Se ction 564(b)(1) of the Act, 21 U.S.C. section 360bbb-3(b)(1), unless the authorization is terminated or revoked sooner.  Performed at [facility]  [facility], [place] [address], [place], [place] [address]      Labs: BNP (last 3 results) Recent Labs    04/05/20 0238 04/06/20 0146 08/05/20 1403  BNP 224.9* 86.4 123XX123*   Basic Metabolic Panel: Recent Labs  Lab 08/03/20 0034 08/04/20 0354 08/06/20 0848 08/07/20 0432 08/08/20 0414  NA 142 143 139 138 139  K 4.5 4.7 4.4 3.8 4.3  CL 107 107 101 98 99  CO2 26 30 25 30 31   GLUCOSE 161* 92 269* 100* 82  BUN 35* 31* 33* 38* 44*  CREATININE 1.54* 1.51* 1.64* 1.53* 1.57*  CALCIUM 9.1 9.1 8.9 9.1 8.8*  MG 1.6*  --   --   --   --   PHOS 3.5  --   --   --   --    Liver Function Tests: Recent Labs  Lab 08/02/20 2126 08/03/20 0034  AST 28 27  ALT 14 13  ALKPHOS 64 62  BILITOT 0.4 0.5  PROT 7.2 6.6  ALBUMIN 3.4* 3.2*   No results for input(s): LIPASE, AMYLASE in the last 168 hours. No results for input(s): AMMONIA in the last 168 hours. CBC: Recent Labs  Lab 08/02/20 2126 08/03/20 0034 08/04/20 0354 08/07/20 0432  WBC 7.3 7.4 9.1 9.3  NEUTROABS 6.5 6.4  --  6.7  HGB 11.4* 10.6* 10.2* 10.5*  HCT 36.6* 33.9* 32.9* 33.6*  MCV 97.3 96.6 96.8 95.5  PLT 178 170 168 212   Cardiac Enzymes: No results for  input(s): CKTOTAL, CKMB, CKMBINDEX, TROPONINI in the last 168 hours. BNP: Invalid input(s): POCBNP CBG: Recent Labs  Lab 08/07/20 1610 08/07/20 2035 08/08/20 0013 08/08/20 0357 08/08/20 0729  GLUCAP 197* 271* 176* 86 100*   D-Dimer No results for input(s): DDIMER in the last 72 hours. Hgb A1c Recent Labs    08/07/20 0432  HGBA1C 6.8*   Lipid Profile No results for input(s): CHOL, HDL, LDLCALC, TRIG, CHOLHDL, LDLDIRECT in the last 72 hours. Thyroid function studies No results for input(s): TSH, T4TOTAL, T3FREE, THYROIDAB in the last 72 hours.  Invalid input(s): FREET3 Anemia work up No results for input(s): VITAMINB12, FOLATE, FERRITIN, TIBC, IRON, RETICCTPCT in the last 72 hours. Urinalysis    Component Value Date/Time   COLORURINE RED (A) 04/15/2020 1938   APPEARANCEUR TURBID (A) 04/15/2020 1938   LABSPEC  04/15/2020 1938    TEST NOT REPORTED DUE TO COLOR INTERFERENCE OF URINE PIGMENT   PHURINE  04/15/2020 1938    TEST NOT REPORTED DUE TO COLOR INTERFERENCE OF URINE PIGMENT   GLUCOSEU (A) 04/15/2020 1938    TEST NOT REPORTED DUE TO COLOR INTERFERENCE OF URINE PIGMENT   GLUCOSEU NEGATIVE 10/16/2014 1203   HGBUR (A) 04/15/2020 1938    TEST NOT REPORTED DUE TO COLOR INTERFERENCE OF URINE PIGMENT   HGBUR negative 10/06/2006 0756   BILIRUBINUR (A) 04/15/2020 1938    TEST NOT REPORTED DUE TO COLOR INTERFERENCE OF URINE PIGMENT   BILIRUBINUR negative 09/09/2017 1217   KETONESUR (A) 04/15/2020 1938    TEST NOT REPORTED DUE TO COLOR INTERFERENCE OF URINE PIGMENT   PROTEINUR (A) 04/15/2020 1938    TEST NOT REPORTED DUE TO COLOR INTERFERENCE OF URINE PIGMENT   UROBILINOGEN 0.2 09/09/2017 1217   UROBILINOGEN 1.0 10/16/2014 1203   NITRITE (A) 04/15/2020 1938    TEST NOT REPORTED DUE TO COLOR INTERFERENCE OF URINE PIGMENT   LEUKOCYTESUR (A) 04/15/2020 1938    TEST NOT REPORTED DUE TO COLOR INTERFERENCE OF URINE PIGMENT  Sepsis Labs Invalid input(s): PROCALCITONIN,  WBC,   LACTICIDVEN Microbiology Recent Results (from the past 240 hour(s))  SARS CORONAVIRUS 2 (TAT 6-24 HRS) Nasopharyngeal Nasopharyngeal Swab     Status: None   Collection Time: 07/31/20  3:16 PM   Specimen: Nasopharyngeal Swab  Result Value Ref Range Status   SARS Coronavirus 2 NEGATIVE NEGATIVE Final    Comment: (NOTE) SARS-CoV-2 target nucleic acids are NOT DETECTED.  The SARS-CoV-2 RNA is generally detectable in upper and lower respiratory specimens during the acute phase of infection. Negative results do not preclude SARS-CoV-2 infection, do not rule out co-infections with other pathogens, and should not be used as the sole basis for treatment or other patient management decisions. Negative results must be combined with clinical observations, patient history, and epidemiological information. The expected result is Negative.  Fact Sheet for Patients: SugarRoll.be  Fact Sheet for Healthcare Providers: https://www.woods-mathews.com/  This test is not yet approved or cleared by the [place] FDA and  has been authorized for detection and/or diagnosis of SARS-CoV-2 by FDA under an Emergency Use Authorization (EUA). This EUA will remain  in effect (meaning this test can be used) for the duration of the COVID-19 declaration under Se ction 564(b)(1) of the Act, 21 U.S.C. section 360bbb-3(b)(1), unless the authorization is terminated or revoked sooner.  Performed at [facility], [place] [address], [place], [place] [address]     Please note: You were cared for by a hospitalist during your hospital stay. Once you are discharged, your primary care physician will handle any further medical issues. Please note that NO REFILLS for any discharge medications will be authorized once you are discharged, as it is imperative that you return to your primary care physician (or establish a relationship with a primary care physician if you do not have  one) for your post hospital discharge needs so that they can reassess your need for medications and monitor your lab values.    Time coordinating discharge: 40 minutes  SIGNED:   Shelly Coss, MD  [facility] 08/08/2020, 10:31 AM Pager [phone]  If 7PM-7AM, please contact night-coverage [email or web address] Password [email or web address]

## 2020-08-08 NOTE — Progress Notes (Signed)
RN reviewed discharge paperwork with patient and his daughter. All questions addressed. IV removed. Telemetry removed. No further needs at this time. Pt discharge with home oxygen tank in private vehicle with daughter.

## 2020-08-15 ENCOUNTER — Telehealth: Payer: Self-pay | Admitting: Internal Medicine

## 2020-08-15 NOTE — Telephone Encounter (Signed)
LMOM asking for call back.  

## 2020-08-15 NOTE — Telephone Encounter (Signed)
Patient's daughter would like a call back in regards to patient medication list. She believes there is a discrepancy

## 2020-08-17 ENCOUNTER — Telehealth: Payer: Self-pay

## 2020-08-17 NOTE — Telephone Encounter (Signed)
na

## 2020-08-17 NOTE — Telephone Encounter (Signed)
Pt daughter called back and had several questions about fathers medications given to him at hospital, was advised to come in sooner for h/f and was booked with [facility] on 08/20/2020. -JMA

## 2020-08-20 ENCOUNTER — Encounter: Payer: Self-pay | Admitting: Family

## 2020-08-20 ENCOUNTER — Ambulatory Visit (INDEPENDENT_AMBULATORY_CARE_PROVIDER_SITE_OTHER): Payer: Medicare HMO | Admitting: Family

## 2020-08-20 ENCOUNTER — Other Ambulatory Visit: Payer: Self-pay

## 2020-08-20 VITALS — BP 136/50 | HR 64 | Temp 98.5°F | Resp 16 | Wt 181.0 lb

## 2020-08-20 DIAGNOSIS — E785 Hyperlipidemia, unspecified: Secondary | ICD-10-CM

## 2020-08-20 DIAGNOSIS — J9621 Acute and chronic respiratory failure with hypoxia: Secondary | ICD-10-CM

## 2020-08-20 DIAGNOSIS — E119 Type 2 diabetes mellitus without complications: Secondary | ICD-10-CM

## 2020-08-20 DIAGNOSIS — I10 Essential (primary) hypertension: Secondary | ICD-10-CM

## 2020-08-20 DIAGNOSIS — J9611 Chronic respiratory failure with hypoxia: Secondary | ICD-10-CM

## 2020-08-20 LAB — BASIC METABOLIC PANEL
BUN: 20 mg/dL (ref 6–23)
CO2: 32 mEq/L (ref 19–32)
Calcium: 8.8 mg/dL (ref 8.4–10.5)
Chloride: 100 mEq/L (ref 96–112)
Creatinine, Ser: 1.53 mg/dL — ABNORMAL HIGH (ref 0.40–1.50)
GFR: 41.68 mL/min — ABNORMAL LOW (ref >60.00)
Glucose, Bld: 113 mg/dL — ABNORMAL HIGH (ref 70–99)
Potassium: 4.9 mEq/L (ref 3.5–5.1)
Sodium: 140 mEq/L (ref 135–145)

## 2020-08-20 MED ORDER — CARVEDILOL 3.125 MG PO TABS: 3.1250 mg | ORAL_TABLET | Freq: Two times a day (BID) | ORAL | 0 refills | Status: DC

## 2020-08-20 NOTE — Progress Notes (Signed)
Subjective:   By signing my name below, I, Shehryar Baig, attest that this documentation has been prepared under the direction and in the presence of Debbrah Alar NP. 08/20/2020     Patient ID: Bradley Johns, male    DOB: Aug 29, 1934, 86 y.o.   MRN: [id number]  Chief Complaint  Patient presents with  . Follow-up    Here for hospital follow up    HPI Patient is in today for a hospital follow up. He is accompanied by is daughter to help assist him during the appointment. He had a bladder procedure on 08/02/2020  but post-operatively, his blood pressure elevated and his O2 saturation dropped so was admitted to in the hospital for acute on chronic respiratory failure.    Acute on Chronic respiratory failure- he was discharged home on 4L oxygen/minute. He was continued on bronchodilators, budesonide,  Aformoterol. He had a negative V/Q scan during his hospitalization.  He was treated for volume overload with IV lasix which was transitioned to PO.    Urothelial carcinoma bladder:Status post cystoscopy, transurethral resection of bladder tumor on 4/28 by urology  Hypertension- He is complaining of his 20 mg lasix daily PO causing him to use the bathroom evey hour and half. He was not given his 3.125 mg carvedilol daily PO after his hospital stay. His other provider stopped his amlodipine. He reports that his blood pressure this morning was high but he also measured it right after drinking coffee.    DM2- maintained on actos 45mg .   Lab Results  Component Value Date   HGBA1C 6.8 (H) 08/07/2020   HGBA1C 6.4 (H) 07/26/2020   HGBA1C 5.7 (H) 03/31/2020   Lab Results  Component Value Date   MICROALBUR 5.4 (H) 04/18/2008   LDLCALC 65 03/24/2018   CREATININE 1.53 (H) 08/20/2020   Accelerated HTN- BP medications include:  Clonidine 0.1mg  bid, coreg 3.125mg  bid, amlodipine 10mg  once daily, cardura 4mg .   BP Readings from Last 3 Encounters:  08/20/20 (!) 136/50  08/08/20 135/84   07/26/20 (!) 156/52   Hyperlipidemia- maintained on atorvastatin 80mg  once dialy.   Past Medical History:  Diagnosis Date  . Anemia    iron  . Arthritis   . Benign prostatic hypertrophy   . COPD (chronic obstructive pulmonary disease) ([place])    24/7 O2  . Diabetes mellitus    per Dr Posey Pronto  . Epidermal cyst of face    left lower lateral cheek  . Gout   . History of colon polyps   . History of kidney stones 1980s  . Hyperlipidemia   . Hypertension   . Kidney disease   . Pulmonary fibrosis ([place]) 2014  . Sleep apnea   . Urothelial carcinoma ([place]) 05/24/2020    Past Surgical History:  Procedure Laterality Date  . COLONOSCOPY WITH PROPOFOL N/A 05/18/2018   Procedure: COLONOSCOPY WITH PROPOFOL;  Surgeon: Carol Ada, MD;  Location: [facility];  Service: Endoscopy;  Laterality: N/A;  . POLYPECTOMY  05/18/2018   Procedure: POLYPECTOMY;  Surgeon: Carol Ada, MD;  Location: [facility];  Service: Endoscopy;;  . TOE SURGERY    . TRANSURETHRAL RESECTION OF BLADDER TUMOR WITH MITOMYCIN-C N/A 08/02/2020   Procedure: TRANSURETHRAL RESECTION OF BLADDER TUMOR;  Surgeon: Franchot Gallo, MD;  Location: [facility];  Service: Urology;  Laterality: N/A;  1 HR    Family History  Problem Relation Age of Onset  . Liver disease Mother   . Coronary artery disease Father 33  . Diabetes Sister   .  Cancer Brother        type?  Marland Kitchen Hypertension Neg Hx   . Hyperlipidemia Neg Hx   . Heart attack Neg Hx   . Prostate cancer Neg Hx   . Colon cancer Neg Hx     Social History   Socioeconomic History  . Marital status: Married    Spouse name: Not on file  . Number of children: 2  . Years of education: Not on file  . Highest education level: Not on file  Occupational History  . Occupation: retired  Tobacco Use  . Smoking status: Former Smoker    Packs/day: 1.50    Years: 40.00    Pack years: 60.00    Types: Cigarettes    Quit date: 04/07/1996    Years since quitting: 24.3  . Smokeless  tobacco: Never Used  Vaping Use  . Vaping Use: Never used  Substance and Sexual Activity  . Alcohol use: Yes    Comment: occassional beer  . Drug use: No  . Sexual activity: Yes  Other Topics Concern  . Not on file  Social History Narrative   Lives w/ wife   Social Determinants of Health   Financial Resource Strain: Not on file  Food Insecurity: Not on file  Transportation Needs: Not on file  Physical Activity: Not on file  Stress: Not on file  Social Connections: Not on file  Intimate Partner Violence: Not on file    Outpatient Medications Prior to Visit  Medication Sig Dispense Refill  . albuterol (VENTOLIN HFA) 108 (90 Base) MCG/ACT inhaler Inhale 2 puffs into the lungs every 4 (four) hours as needed for wheezing or shortness of breath. 8 g 1  . amLODipine (NORVASC) 10 MG tablet Take 1 tablet (10 mg total) by mouth daily. 30 tablet 0  . atorvastatin (LIPITOR) 80 MG tablet Take 1 tablet (80 mg total) by mouth daily. (Patient taking differently: Take 80 mg by mouth every evening.) 90 tablet 1  . cloNIDine (CATAPRES) 0.1 MG tablet Take 1 tablet (0.1 mg total) by mouth 2 (two) times daily. 60 tablet 0  . colchicine 0.6 MG tablet Take 1 tablet (0.6 mg total) by mouth 2 (two) times daily as needed. 60 tablet 0  . doxazosin (CARDURA) 4 MG tablet Take 1 tablet (4 mg total) by mouth at bedtime. 90 tablet 0  . ferrous sulfate 325 (65 FE) MG tablet Take 1 tablet (325 mg total) by mouth daily with breakfast.  3  . furosemide (LASIX) 20 MG tablet Take 1 tablet (20 mg total) by mouth daily. 30 tablet 1  . ipratropium-albuterol (DUONEB) 0.5-2.5 (3) MG/3ML SOLN Take 3 mLs by nebulization every 6 (six) hours as needed. 360 mL 0  . ketoconazole (NIZORAL) 2 % cream Apply 1 application topically daily. (Patient taking differently: Apply 1 application topically daily as needed (moisture around groin).) 30 g 3  . mometasone-formoterol (DULERA) 100-5 MCG/ACT AERO Inhale 2 puffs into the lungs 2 (two)  times daily. 13 each 1  . Multiple Vitamin (MULTIVITAMIN) tablet Take 1 tablet by mouth daily.    . OXYGEN Inhale 3 L into the lungs continuous.    . pioglitazone (ACTOS) 45 MG tablet Take 45 mg by mouth daily.    . polyethylene glycol (MIRALAX / GLYCOLAX) 17 g packet Take 17 g by mouth daily. 14 each 0  . saline (AYR) GEL Place 1 application into both nostrils every 12 (twelve) hours.  0  . senna (SENOKOT) 8.6 MG TABS  tablet Take 1 tablet (8.6 mg total) by mouth 2 (two) times daily. 14 tablet 0  . sodium chloride (OCEAN) 0.65 % SOLN nasal spray Place 1 spray into both nostrils as needed for congestion.  0  . carvedilol (COREG) 3.125 MG tablet Take 1 tablet (3.125 mg total) by mouth 2 (two) times daily with a meal.     No facility-administered medications prior to visit.    No Known Allergies  ROS    see HPI  Objective:    Physical Exam Constitutional:      Appearance: Normal appearance.  HENT:     Head: Normocephalic and atraumatic.     Right Ear: External ear normal.     Left Ear: External ear normal.  Eyes:     Extraocular Movements: Extraocular movements intact.     Pupils: Pupils are equal, round, and reactive to light.  Cardiovascular:     Rate and Rhythm: Normal rate and regular rhythm.     Pulses: Normal pulses.     Heart sounds: Normal heart sounds. No murmur heard. No friction rub. No gallop.   Pulmonary:     Effort: Pulmonary effort is normal. No respiratory distress.     Breath sounds: Examination of the right-lower field reveals rales. Examination of the left-lower field reveals rales. Rales present.  Skin:    General: Skin is warm and dry.  Neurological:     Mental Status: He is alert and oriented to person, place, and time.  Psychiatric:        Behavior: Behavior normal.     BP (!) 136/50 (BP Location: Right Arm, Patient Position: Sitting, Cuff Size: Small)   Pulse 64   Temp 98.5 F (36.9 C) (Oral)   Resp 16   Wt 181 lb (82.1 kg)   SpO2 98%   BMI  27.52 kg/m  Wt Readings from Last 3 Encounters:  08/20/20 181 lb (82.1 kg)  08/02/20 177 lb 4 oz (80.4 kg)  07/26/20 179 lb 7.3 oz (81.4 kg)    Diabetic Foot Exam - Simple   No data filed    Lab Results  Component Value Date   WBC 9.3 08/07/2020   HGB 10.5 (L) 08/07/2020   HCT 33.6 (L) 08/07/2020   PLT 212 08/07/2020   GLUCOSE 113 (H) 08/20/2020   CHOL 135 03/24/2018   TRIG 119.0 03/24/2018   HDL 46.30 03/24/2018   LDLDIRECT 117.2 09/17/2011   LDLCALC 65 03/24/2018   ALT 13 08/03/2020   AST 27 08/03/2020   NA 140 08/20/2020   K 4.9 08/20/2020   CL 100 08/20/2020   CREATININE 1.53 (H) 08/20/2020   BUN 20 08/20/2020   CO2 32 08/20/2020   TSH 1.789 08/03/2020   PSA 1.62 09/09/2017   HGBA1C 6.8 (H) 08/07/2020   MICROALBUR 5.4 (H) 04/18/2008    Lab Results  Component Value Date   TSH 1.789 08/03/2020   Lab Results  Component Value Date   WBC 9.3 08/07/2020   HGB 10.5 (L) 08/07/2020   HCT 33.6 (L) 08/07/2020   MCV 95.5 08/07/2020   PLT 212 08/07/2020   Lab Results  Component Value Date   NA 140 08/20/2020   K 4.9 08/20/2020   CO2 32 08/20/2020   GLUCOSE 113 (H) 08/20/2020   BUN 20 08/20/2020   CREATININE 1.53 (H) 08/20/2020   BILITOT 0.5 08/03/2020   ALKPHOS 62 08/03/2020   AST 27 08/03/2020   ALT 13 08/03/2020   PROT 6.6 08/03/2020  ALBUMIN 3.2 (L) 08/03/2020   CALCIUM 8.8 08/20/2020   ANIONGAP 9 08/08/2020   GFR 41.68 (L) 08/20/2020   Lab Results  Component Value Date   CHOL 135 03/24/2018   Lab Results  Component Value Date   HDL 46.30 03/24/2018   Lab Results  Component Value Date   LDLCALC 65 03/24/2018   Lab Results  Component Value Date   TRIG 119.0 03/24/2018   Lab Results  Component Value Date   CHOLHDL 3 03/24/2018   Lab Results  Component Value Date   HGBA1C 6.8 (H) 08/07/2020       Assessment & Plan:   Problem List Items Addressed This Visit   None   Visit Diagnoses    Primary hypertension    -  Primary    Relevant Medications   carvedilol (COREG) 3.125 MG tablet   Other Relevant Orders   Basic metabolic panel (Completed)       Meds ordered this encounter  Medications  . DISCONTD: carvedilol (COREG) 3.125 MG tablet    Sig: Take 1 tablet (3.125 mg total) by mouth 2 (two) times daily with a meal.    Dispense:  60 tablet    Refill:  0    Order Specific Question:   Supervising Provider    Answer:   Penni Homans A [[id number]]  . carvedilol (COREG) 3.125 MG tablet    Sig: Take 1 tablet (3.125 mg total) by mouth 2 (two) times daily with a meal.    Dispense:  180 tablet    Refill:  0    Order Specific Question:   Supervising Provider    Answer:   Penni Homans A [[id number]]    I, Debbrah Alar NP, personally preformed the services described in this documentation.  All medical record entries made by the scribe were at my direction and in my presence.  I have reviewed the chart and discharge instructions (if applicable) and agree that the record reflects my personal performance and is accurate and complete. 08/20/2020   I,Shehryar Baig,acting as a Education administrator for Nance Pear, NP.,have documented all relevant documentation on the behalf of Nance Pear, NP,as directed by  Nance Pear, NP while in the presence of Nance Pear, NP.   Nance Pear, NP

## 2020-08-20 NOTE — Patient Instructions (Addendum)
Please complete lab work prior to leaving. Follow up as scheduled with Dr. Larose Kells and Pulmonology.

## 2020-08-23 ENCOUNTER — Ambulatory Visit: Payer: Medicare HMO | Admitting: Internal Medicine

## 2020-08-26 NOTE — Assessment & Plan Note (Signed)
Lab Results  Component Value Date   CHOL 135 03/24/2018   HDL 46.30 03/24/2018   LDLCALC 65 03/24/2018   LDLDIRECT 117.2 09/17/2011   TRIG 119.0 03/24/2018   CHOLHDL 3 03/24/2018   Patient continues atorvastatin 80mg . Will be due for follow up lipid panel next visit.

## 2020-08-26 NOTE — Assessment & Plan Note (Signed)
A1C is at goal on Actos, continue same.

## 2020-08-26 NOTE — Assessment & Plan Note (Signed)
BP Readings from Last 3 Encounters:  08/20/20 (!) 136/50  08/08/20 135/84  07/26/20 (!) 156/52   BP is currently stable. Continue Clonidine 0.1mg  bid, coreg 3.125mg  bid, amlodipine 10mg  once daily, cardura 4mg .

## 2020-08-26 NOTE — Assessment & Plan Note (Addendum)
Patient is back at baseline.  Continue oxygen via nasal cannula and lasix. Continue to monitor weights. Pt appears euvolemic today on exam.   Wt Readings from Last 3 Encounters:  08/20/20 181 lb (82.1 kg)  08/02/20 177 lb 4 oz (80.4 kg)  07/26/20 179 lb 7.3 oz (81.4 kg)   Patient is advised to keep his upcoming appointment with pulmonology on Monday.

## 2020-08-27 ENCOUNTER — Encounter: Payer: Self-pay | Admitting: Pulmonary Disease

## 2020-08-27 ENCOUNTER — Other Ambulatory Visit: Payer: Self-pay

## 2020-08-27 ENCOUNTER — Ambulatory Visit: Payer: Medicare HMO | Admitting: Pulmonary Disease

## 2020-08-27 VITALS — BP 140/50 | HR 70 | Temp 97.4°F | Ht 68.0 in | Wt 179.0 lb

## 2020-08-27 DIAGNOSIS — J9611 Chronic respiratory failure with hypoxia: Secondary | ICD-10-CM

## 2020-08-27 DIAGNOSIS — R0600 Dyspnea, unspecified: Secondary | ICD-10-CM | POA: Diagnosis not present

## 2020-08-27 DIAGNOSIS — R0609 Other forms of dyspnea: Secondary | ICD-10-CM

## 2020-08-27 DIAGNOSIS — J439 Emphysema, unspecified: Secondary | ICD-10-CM

## 2020-08-27 NOTE — Progress Notes (Signed)
@Patient  ID: Bradley Johns, male    DOB: May 01, 1934, 86 y.o.   MRN: [id number]  Chief Complaint  Patient presents with  . Follow-up    SOB with activity, dry cough with exertion    Referring provider: Colon Branch, MD  HPI:   86 year old with COPD and possible pulmonary fibrosis whom we are seeing in follow up after recent hospitalization. Op note from bladder tumor resection reviewed. Discharge summary reviewed.   Patient had bladder resection 08/02/2020.  Seems like things went well.  He had severe hypertension in the PACU per his report, systolic in [id number].  Escalating oxygen requirements.  He was treated for COPD exacerbation with steroids although noted not to be wheezing.  He was diuresed with IV Lasix and suddenly discharged on oral Lasix.  Oxygen requirement slowly improved.  Using 4 L with exertion up from 3 L prior to surgery.  Notes that he was instructed to use 5 L when using his downstairs concentrator due to the long length of his tubing.  He has baseline dyspnea that seems largely unchanged.  He uses duo nebs infrequently thinks it helps some.  Not using regularly.  Review chest x-ray immediately postop which reveals interstitial and mild alveolar filling with fluid in the fissure on the right consistent with mild volume overload on my interpretation, largely unchanged from 04/03/2020.  HPI at initial visit: Patient states he is doing okay.  He has baseline dyspnea on exertion.  He thinks is gradual worse over time but not much change over the last several months.  Certainly worse over the last couple months although he thinks he slowly getting stamina back.  He was hospitalized on Christmas Day 2021.  He had a fall at home.  Hemoglobin was low.  He received blood.  VQ scan was negative for PE.  He got much worse couple days later and was given Lasix and Solu-Medrol for worsening hypoxemia.  He has brief with described as bradycardic PEA arrest and received CPR and chemical  resuscitation.  He was not intubated.  Unclear if he truly lost a pulse.  He was monitored in the ICU after this.  He suddenly improved and was discharged home.  Had a rehab stent.  He is now back at home.  Again dyspnea worse than prior.  Not much worse overall from several months ago.  He thinks he slowly get the strength back from hospitalization.  He is using oxygen as prescribed.  Using around 4 L mostly.  He is on 3 L today.  No recent upper respiratory infections in the last month.  Hemoglobin most recently last month was greater than 10.  His oxygen saturation today is 95% on supplemental O2.  Reviewed chest images most recent chest x-ray 12/28 2021 which shows interstitial changes worsened from admission concerning for pulmonary edema on my interpretation.  Most recent cross-sectional imaging 06/09/2018 reviewed which shows significant emphysematous changes in the upper lobes, paraseptal and peripheral cystic changes throughout consistent with emphysema versus pulmonary fibrosis on my interpretation.  Most recent TTE 03/2020 reviewed with normal EF, reportedly normal diastolic pressures, dilated LA, MVR, mild AS, elevated RA pressures, RA size normal, RV size and function normal, moderately elevated PASP.  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  MMRC: mMRC Dyspnea Scale mMRC Score  06/14/2020 3    Epworth:  No flowsheet data found.  Tests:   FENO:  No results found for: NITRICOXIDE  PFT: PFT Results Latest Ref Rng & Units  04/21/2014  FVC-Pre L 1.99  FVC-Predicted Pre % 59  FVC-Post L 2.22  FVC-Predicted Post % 66  Pre FEV1/FVC % % 74  Post FEV1/FCV % % 74  FEV1-Pre L 1.47  FEV1-Predicted Pre % 60  FEV1-Post L 1.65  DLCO uncorrected ml/min/mmHg 8.10  DLCO UNC% % 27  DLVA Predicted % 51  TLC L 4.21  TLC % Predicted % 64  RV % Predicted % 81  PFTs reviewed on my interpretation revealed moderate restriction with reduced DLCO, normal FEV1/FVC ratio, FEV1 and FVC both reduced, DLCO severely reduced  WALK:  SIX MIN WALK 12/28/2012 12/28/2012 12/14/2012 12/14/2012  Supplimental Oxygen during Test? (L/min) Yes Yes Yes No  O2 Flow Rate 3 2 3  -  Type Continuous Continuous Continuous -  Tech Comments: - Pt was placed on 3 liters O2 cont flowand then recovered 91%  Pt desaturated 1 st lap 83% 2 liters. Pt was placed on 3 liters cont flow and O2 sats was 91%. pt did 2 laps with 3 liters cont flow and sats maintained 95% -    Imaging: Personally reviewed and as per EMR discussion in this note  Lab Results: Personally reviewed, anemia stable, most recent hemoglobin 10.5, no elevated eosinophils CBC    Component Value Date/Time   WBC 9.3 08/07/2020 0432   RBC 3.52 (L) 08/07/2020 0432   HGB 10.5 (L) 08/07/2020 0432   HCT 33.6 (L) 08/07/2020 0432   PLT 212 08/07/2020 0432   MCV 95.5 08/07/2020 0432   MCH 29.8 08/07/2020 0432   MCHC 31.3 08/07/2020 0432   RDW 14.8 08/07/2020 0432   LYMPHSABS 1.6 08/07/2020 0432   MONOABS 0.9 08/07/2020 0432   EOSABS 0.1 08/07/2020 0432   BASOSABS 0.0 08/07/2020 0432    BMET    Component Value Date/Time   NA 140 08/20/2020 1322   K 4.9 08/20/2020 1322   CL 100 08/20/2020 1322   CO2 32 08/20/2020 1322   GLUCOSE 113 (H) 08/20/2020 1322   GLUCOSE 176 07/12/2008 0000   BUN 20 08/20/2020 1322   CREATININE 1.53 (H) 08/20/2020 1322   CREATININE 1.36 (H) 12/21/2015 1430   CALCIUM 8.8 08/20/2020 1322   GFRNONAA 43 (L) 08/08/2020 0414   GFRAA >60 05/25/2018 0310    BNP     Component Value Date/Time   BNP 471.6 (H) 08/05/2020 1403    ProBNP    Component Value Date/Time   PROBNP 30.0 12/13/2012 1615    Specialty Problems      Pulmonary Problems   COPD with emphysema, on O2    FEV1 67%       Sleep apnea           Dyspnea on exertion   Pulmonary fibrosis (HCC)    Favor IPF DLCO 32%      Chronic respiratory failure with hypoxia (HCC)   Hypoxia   Acute on chronic respiratory failure with hypoxia (HCC)   SOB (shortness of breath)   Acute respiratory failure with hypoxia (HCC)      No Known Allergies  Immunization History  Administered Date(s)  Administered  . Fluad Quad(high Dose 65+) 12/31/2018, 05/07/2020  . Influenza Split 03/18/2011  . Influenza Whole 01/10/2009, 01/11/2010  . Influenza, High Dose Seasonal PF 02/14/2013, 12/29/2014, 12/21/2015, 03/10/2017, 01/11/2018  . Influenza, Seasonal, Injecte, Preservative Fre 04/06/2012  . Influenza,inj,Quad PF,6+ Mos 02/16/2014  . PFIZER(Purple Top)SARS-COV-2 Vaccination 06/30/2019, 07/21/2019, 01/26/2020  . Pneumococcal Conjugate-13 12/29/2014  . Pneumococcal Polysaccharide-23 10/20/1999, 07/11/2008, 06/15/2013  . Td 04/10/1999, 01/11/2010    Past Medical History:  Diagnosis Date  . Anemia    iron  . Arthritis   . Benign prostatic hypertrophy   . COPD (chronic obstructive pulmonary disease) ([place])    24/7 O2  . Diabetes mellitus    per Dr Posey Pronto  . Epidermal cyst of face    left lower lateral cheek  . Gout   . History of colon polyps   . History of kidney stones 1980s  . Hyperlipidemia   . Hypertension   . Kidney disease   . Pulmonary fibrosis ([place]) 2014  . Sleep apnea   . Urothelial carcinoma ([place]) 05/24/2020    Tobacco History: Social History   Tobacco Use  Smoking Status Former Smoker  . Packs/day: 1.50  . Years: 40.00  . Pack years: 60.00  . Types: Cigarettes  . Quit date: 04/07/1996  . Years since quitting: 24.4  Smokeless Tobacco Never Used   Counseling  given: Not Answered   Continue to not smoke  Outpatient Encounter Medications as of 08/27/2020  Medication Sig  . albuterol (VENTOLIN HFA) 108 (90 Base) MCG/ACT inhaler Inhale 2 puffs into the lungs every 4 (four) hours as needed for wheezing or shortness of breath.  Marland Kitchen amLODipine (NORVASC) 10 MG tablet Take 1 tablet (10 mg total) by mouth daily.  Marland Kitchen atorvastatin (LIPITOR) 80 MG tablet Take 1 tablet (80 mg total) by mouth daily. (Patient taking differently: Take 80 mg by mouth every evening.)  . carvedilol (COREG) 3.125 MG tablet Take 1 tablet (3.125 mg total) by mouth 2 (two) times daily with a meal.  . cloNIDine (CATAPRES) 0.1 MG tablet Take 1 tablet (0.1 mg total) by mouth 2 (two) times daily.  . colchicine 0.6 MG tablet Take 1 tablet (0.6 mg total) by mouth 2 (two) times daily as needed.  . doxazosin (CARDURA) 4 MG tablet Take 1 tablet (4 mg total) by mouth at bedtime.  . ferrous sulfate 325 (65 FE) MG tablet Take 1 tablet (325 mg total) by mouth daily with breakfast.  . furosemide (LASIX) 20 MG tablet Take 1 tablet (20 mg total) by mouth daily.  Marland Kitchen ipratropium-albuterol (DUONEB) 0.5-2.5 (3) MG/3ML SOLN Take 3 mLs by nebulization every 6 (six) hours as needed.  Marland Kitchen ketoconazole (NIZORAL) 2 % cream Apply 1 application topically daily. (Patient taking differently: Apply 1 application topically daily as needed (moisture around groin).)  . mometasone-formoterol (DULERA) 100-5 MCG/ACT AERO Inhale 2 puffs into the lungs 2 (two) times daily.  . Multiple Vitamin (MULTIVITAMIN) tablet Take 1 tablet by mouth daily.  . OXYGEN Inhale 3 L into the lungs continuous.  . pioglitazone (ACTOS) 45 MG tablet Take 45 mg by mouth daily.  . polyethylene glycol (MIRALAX / GLYCOLAX) 17 g packet Take 17 g by mouth daily.  . saline (AYR) GEL Place 1 application into both nostrils every 12 (twelve) hours.  . senna (SENOKOT) 8.6 MG TABS tablet Take 1 tablet (8.6 mg total) by mouth 2 (two) times daily.  . sodium chloride  (OCEAN) 0.65 % SOLN nasal spray Place 1 spray into both  nostrils as needed for congestion.   No facility-administered encounter medications on file as of 08/27/2020.     Review of Systems  Review of Systems  n/a Physical Exam  BP (!) 140/50 (BP Location: Right Arm, Cuff Size: Normal)   Pulse 70   Temp (!) 97.4 F (36.3 C)   Ht 5\' 8"  (1.727 m)   Wt 179 lb (81.2 kg)   SpO2 92%   BMI 27.22 kg/m   Wt Readings from Last 5 Encounters:  08/27/20 179 lb (81.2 kg)  08/20/20 181 lb (82.1 kg)  08/02/20 177 lb 4 oz (80.4 kg)  07/26/20 179 lb 7.3 oz (81.4 kg)  06/21/20 179 lb 5 oz (81.3 kg)    BMI Readings from Last 5 Encounters:  08/27/20 27.22 kg/m  08/20/20 27.52 kg/m  08/02/20 26.95 kg/m  07/26/20 27.29 kg/m  06/21/20 27.26 kg/m     Physical Exam General: Thin, sitting in exam chair Neck: Supple, no JVP Eyes: EOMI, no icterus Pulmonary: Distant sounds throughout, NWOB on [place] Cardiovascular: Regular rate and rhythm, no murmur   Assessment & Plan:   Dyspnea exertion: Multifactorial, related to COPD, deconditioning, possible pulmonary fibrosis although do query if this is just septal and peripheral emphysematous changes, significant valvular disease, diastolic dysfunction, left atrial hypertension.   COPD: Presumed based on emphysema, PFTs showed restrictive pattern although flow volume curve consistent with obstruction.  Nebulizer machine ordered today.  Instructed to use DuoNebs twice daily as he did not follow through with this last visit.  If helpful, would plan to escalate bronchodilator therapy.  Hypoxemic respiratory failure, chroinic: related to emphysema, COPD. Had high BP while admitted and improved with diuretics - suspect flash pulmonary edema. Chronic LA dilation so pre-disposed to fluid accumulation. Advised to continue lasix started during admission.   Return in about 6 months (around 02/27/2021).   Lanier Clam, MD 08/27/2020

## 2020-08-27 NOTE — Patient Instructions (Addendum)
Neck to see you  Use the nebulizer twice a day, morning and afternoon/evening.  Lets see if this helps your breathing and shortness of breath.  Otherwise, no medication changes.  Follow-up with Dr. Silas Flood in 6 months or sooner as needed

## 2020-08-30 ENCOUNTER — Ambulatory Visit (INDEPENDENT_AMBULATORY_CARE_PROVIDER_SITE_OTHER): Payer: Medicare HMO | Admitting: Internal Medicine

## 2020-08-30 ENCOUNTER — Ambulatory Visit: Payer: Medicare HMO | Attending: Internal Medicine

## 2020-08-30 ENCOUNTER — Other Ambulatory Visit: Payer: Self-pay

## 2020-08-30 ENCOUNTER — Encounter: Payer: Self-pay | Admitting: Internal Medicine

## 2020-08-30 VITALS — BP 126/60 | HR 66 | Temp 98.0°F | Resp 22 | Ht 68.0 in | Wt 178.5 lb

## 2020-08-30 DIAGNOSIS — E119 Type 2 diabetes mellitus without complications: Secondary | ICD-10-CM | POA: Diagnosis not present

## 2020-08-30 DIAGNOSIS — E785 Hyperlipidemia, unspecified: Secondary | ICD-10-CM | POA: Diagnosis not present

## 2020-08-30 DIAGNOSIS — N19 Unspecified kidney failure: Secondary | ICD-10-CM

## 2020-08-30 DIAGNOSIS — J9611 Chronic respiratory failure with hypoxia: Secondary | ICD-10-CM | POA: Diagnosis not present

## 2020-08-30 DIAGNOSIS — Z23 Encounter for immunization: Secondary | ICD-10-CM

## 2020-08-30 DIAGNOSIS — Z0100 Encounter for examination of eyes and vision without abnormal findings: Secondary | ICD-10-CM

## 2020-08-30 DIAGNOSIS — D62 Acute posthemorrhagic anemia: Secondary | ICD-10-CM | POA: Diagnosis not present

## 2020-08-30 NOTE — Progress Notes (Signed)
Subjective:    Patient ID: Bradley Johns, male    DOB: 12-Dec-1934, 86 y.o.   MRN: [id number]  DOS:  08/30/2020 Type of visit - description: Follow-up, here with her daughter.  In general feels well. O2 sat was quite low when he arrived to the office, we rechecked it and it was okay.  Recently diagnosed with bladder cancer, currently doing well and denies gross hematuria.  He does check his blood pressure and O2 sat at home but did not remember the readings.  BP Readings from Last 3 Encounters:  08/30/20 126/60  08/27/20 (!) 140/50  08/20/20 (!) 136/50     Review of Systems Denies nausea, vomiting.  Appetite is okay.  No blood in the stools.  Again no recent gross hematuria  Past Medical History:  Diagnosis Date  . Anemia    iron  . Arthritis   . Benign prostatic hypertrophy   . COPD (chronic obstructive pulmonary disease) ([place])    24/7 O2  . Diabetes mellitus    per Dr Posey Pronto  . Epidermal cyst of face    left lower lateral cheek  . Gout   . History of colon polyps   . History of kidney stones 1980s  . Hyperlipidemia   . Hypertension   . Kidney disease   . Pulmonary fibrosis ([place]) 2014  . Sleep apnea   . Urothelial carcinoma ([place]) 05/24/2020    Past Surgical History:  Procedure Laterality Date  . COLONOSCOPY WITH PROPOFOL N/A 05/18/2018   Procedure: COLONOSCOPY WITH PROPOFOL;  Surgeon: Carol Ada, MD;  Location: [facility];  Service: Endoscopy;  Laterality: N/A;  . POLYPECTOMY  05/18/2018   Procedure: POLYPECTOMY;  Surgeon: Carol Ada, MD;  Location: [facility];  Service: Endoscopy;;  . TOE SURGERY    . TRANSURETHRAL RESECTION OF BLADDER TUMOR WITH MITOMYCIN-C N/A 08/02/2020   Procedure: TRANSURETHRAL RESECTION OF BLADDER TUMOR;  Surgeon: Franchot Gallo, MD;  Location: [facility];  Service: Urology;  Laterality: N/A;  1 HR    Allergies as of 08/30/2020   No Known Allergies     Medication List       Accurate as of Aug 30, 2020  9:04 PM. If you  have any questions, ask your nurse or doctor.        STOP taking these medications   ferrous sulfate 325 (65 FE) MG tablet Stopped by: Kathlene November, MD     TAKE these medications   albuterol 108 (90 Base) MCG/ACT inhaler Commonly known as: VENTOLIN HFA Inhale 2 puffs into the lungs every 4 (four) hours as needed for wheezing or shortness of breath.   amLODipine 10 MG tablet Commonly known as: NORVASC Take 1 tablet (10 mg total) by mouth daily.   atorvastatin 80 MG tablet Commonly known as: LIPITOR Take 1 tablet (80 mg total) by mouth daily. What changed: when to take this   carvedilol 3.125 MG tablet Commonly known as: COREG Take 1 tablet (3.125 mg total) by mouth 2 (two) times daily with a meal.   cloNIDine 0.1 MG tablet Commonly known as: CATAPRES Take 1 tablet (0.1 mg total) by mouth 2 (two) times daily.   colchicine 0.6 MG tablet Take 1 tablet (0.6 mg total) by mouth 2 (two) times daily as needed.   doxazosin 4 MG tablet Commonly known as: CARDURA Take 1 tablet (4 mg total) by mouth at bedtime.   furosemide 20 MG tablet Commonly known as: Lasix Take 1 tablet (20 mg total) by mouth  daily.   ipratropium-albuterol 0.5-2.5 (3) MG/3ML Soln Commonly known as: DUONEB Take 3 mLs by nebulization every 6 (six) hours as needed.   ketoconazole 2 % cream Commonly known as: NIZORAL Apply 1 application topically daily. What changed:   when to take this  reasons to take this   mometasone-formoterol 100-5 MCG/ACT Aero Commonly known as: DULERA Inhale 2 puffs into the lungs 2 (two) times daily.   multivitamin tablet Take 1 tablet by mouth daily.   OXYGEN Inhale 3 L into the lungs continuous.   pioglitazone 45 MG tablet Commonly known as: ACTOS Take 45 mg by mouth daily.   polyethylene glycol 17 g packet Commonly known as: MIRALAX / GLYCOLAX Take 17 g by mouth daily.   saline Gel Place 1 application into both nostrils every 12 (twelve) hours.   sodium chloride  0.65 % Soln nasal spray Commonly known as: OCEAN Place 1 spray into both nostrils as needed for congestion.   senna 8.6 MG Tabs tablet Commonly known as: SENOKOT Take 1 tablet (8.6 mg total) by mouth 2 (two) times daily.          Objective:   Physical Exam BP 126/60 (BP Location: Left Arm, Patient Position: Sitting, Cuff Size: Normal)   Pulse 66   Temp 98 F (36.7 C) (Oral)   Resp (!) 22   Ht 5\' 8"  (1.727 m)   Wt 178 lb 8 oz (81 kg)   SpO2 98%   BMI 27.14 kg/m  General:   Well developed, NAD, BMI noted. HEENT:  Normocephalic . Face symmetric, atraumatic Lungs:  Decreased breath sounds Normal respiratory effort, no intercostal retractions, no accessory muscle use. Heart: RRR,  no murmur.  Lower extremities: no pretibial edema bilaterally  Skin: Not pale. Not jaundice Neurologic:  alert & oriented X3.  Speech normal, gait appropriate for age and unassisted Psych--  Cognition and judgment appear intact.  Cooperative with normal attention span and concentration.  Behavior appropriate. No anxious or depressed appearing.      Assessment   Assessment  DM Dr. Posey Pronto  HTN Hyperlipidemia DJD-- rarely uses hydrocodone Gout -- occ colchicine PULM: Chronic respiratory failure with hypoxia --COPD on oxygen  24/7   --OSA - no compliant w/ cpap --Pulmonary fibrosis GU: ---BPH    ---High-grade papillary urothelial carcinoma Dx 07/2020, s/p surgery   PLAN DM: Last A1c satisfactory with continue Actos. HTN: BP today is 126/60, continue present care.  Last BMP satisfactory Dyslipidemia: On Lipitor, checking labs. Chronic respiratory failure with hypoxia Last seen by pulmonary 3 days ago, felt to be stable, he did recommend nebulization twice daily. O2 sat upon arrival after walking was 71, I rechecked it after resting: 98%.  No distress.  No change. Iron deficiency anemia: Saw GI 06/21/2020, they felt this was likely due to the GU pathology, now he has confirmed urothelial  carcinoma of the bladder.  They still could consider EGD and colonoscopy in the future. Daughter reports that he for some reason is not taking iron.  We will check a CBC, iron, ferritin.  Restart iron if needed. Acute kidney injury: Saw nephrology 08/09/2020, the daughter brought some records, he has proteinuria. Next visit w/ renal is  September 20, 2020.  I noticed he is not on ARB's, I will let nephrology decide whether or not he needs to prescribe ARB or ACE inhibitor. Preventive care: Recommend COVID-vaccine #4 Will call labs to the daughter RTC 4 months  Time spent: 30 minutes. Daughter had multiple questions  about patient's care, they were answered to the best of my ability.   This visit occurred during the SARS-CoV-2 public health emergency.  Safety protocols were in place, including screening questions prior to the visit, additional usage of staff PPE, and extensive cleaning of exam room while observing appropriate contact time as indicated for disinfecting solutions.

## 2020-08-30 NOTE — Patient Instructions (Addendum)
Per our records your last diabetic eye exam was in 2017, we have placed a referral back to Dr. Antionette Fairy. Please expect a call in the next several days to schedule appointment.   Check the  blood pressure 2 or 3 times a week BP GOAL is between 110/65 and  135/85. If it is consistently higher or lower, let me know  Recommend to proceed with your COVID vaccination #4    GO TO THE LAB : Get the blood work     [place], [place] back for a checkup in 4 months

## 2020-08-30 NOTE — Progress Notes (Signed)
   Covid-19 Vaccination Clinic  Name:  Bradley Johns    MRN: [id number] DOB: 1934/10/27  08/30/2020  Bradley Johns was observed post Covid-19 immunization for 15 minutes without incident. He was provided with Vaccine Information Sheet and instruction to access the V-Safe system.   Bradley Johns was instructed to call 911 with any severe reactions post vaccine: Marland Kitchen Difficulty breathing  . Swelling of face and throat  . A fast heartbeat  . A bad rash all over body  . Dizziness and weakness   Immunizations Administered    Name Date Dose VIS Date Route   PFIZER Comrnaty(Gray TOP) Covid-19 Vaccine 08/30/2020  1:58 PM 0.3 mL 03/15/2020 Intramuscular   Manufacturer: [place]   Lot: [id number]   NDC: [phone]

## 2020-08-30 NOTE — Assessment & Plan Note (Addendum)
  DM: Last A1c satisfactory with continue Actos. HTN: BP today is 126/60, continue present care.  Last BMP satisfactory Dyslipidemia: On Lipitor, checking labs. Chronic respiratory failure with hypoxia Last seen by pulmonary 3 days ago, felt to be stable, he did recommend nebulization twice daily. O2 sat upon arrival after walking was 71, I rechecked it after resting: 98%.  No distress.  No change. Iron deficiency anemia: Saw GI 06/21/2020, they felt this was likely due to the GU pathology, now he has confirmed urothelial carcinoma of the bladder.  They still could consider EGD and colonoscopy in the future. Daughter reports that he for some reason is not taking iron.  We will check a CBC, iron, ferritin.  Restart iron if needed. Acute kidney injury: Saw nephrology 08/09/2020, the daughter brought some records, he has proteinuria. Next visit w/ renal is  September 20, 2020.  I noticed he is not on ARB's, I will let nephrology decide whether or not he needs to prescribe ARB or ACE inhibitor. Preventive care: Recommend COVID-vaccine #4 Will call labs to the daughter RTC 4 months

## 2020-08-31 LAB — LIPID PANEL
Cholesterol: 167 mg/dL (ref 0–200)
HDL: 58.5 mg/dL (ref >39.00)
LDL Cholesterol: 95 mg/dL (ref 0–99)
NonHDL: 108.9
Total CHOL/HDL Ratio: 3
Triglycerides: 72 mg/dL (ref 0.0–149.0)
VLDL: 14.4 mg/dL (ref 0.0–40.0)

## 2020-08-31 LAB — CBC WITH DIFFERENTIAL/PLATELET
Basophils Absolute: 0.1 10*3/uL (ref 0.0–0.1)
Basophils Relative: 1.3 % (ref 0.0–3.0)
Eosinophils Absolute: 0.3 10*3/uL (ref 0.0–0.7)
Eosinophils Relative: 5.7 % — ABNORMAL HIGH (ref 0.0–5.0)
HCT: 32.4 % — ABNORMAL LOW (ref 39.0–52.0)
Hemoglobin: 10.6 g/dL — ABNORMAL LOW (ref 13.0–17.0)
Lymphocytes Relative: 14.6 % (ref 12.0–46.0)
Lymphs Abs: 0.8 10*3/uL (ref 0.7–4.0)
MCHC: 32.8 g/dL (ref 30.0–36.0)
MCV: 93.4 fl (ref 78.0–100.0)
Monocytes Absolute: 0.5 10*3/uL (ref 0.1–1.0)
Monocytes Relative: 9.6 % (ref 3.0–12.0)
Neutro Abs: 3.9 10*3/uL (ref 1.4–7.7)
Neutrophils Relative %: 68.8 % (ref 43.0–77.0)
Platelets: 267 10*3/uL (ref 150.0–400.0)
RBC: 3.47 Mil/uL — ABNORMAL LOW (ref 4.22–5.81)
RDW: 16 % — ABNORMAL HIGH (ref 11.5–15.5)
WBC: 5.7 10*3/uL (ref 4.0–10.5)

## 2020-08-31 LAB — FERRITIN: Ferritin: 39.2 ng/mL (ref 22.0–322.0)

## 2020-08-31 LAB — IRON: Iron: 31 ug/dL — ABNORMAL LOW (ref 42–165)

## 2020-09-05 MED ORDER — FERROUS FUMARATE 325 (106 FE) MG PO TABS: 1.0000 | ORAL_TABLET | Freq: Two times a day (BID) | ORAL | 3 refills | Status: DC

## 2020-09-05 NOTE — Addendum Note (Signed)
Addended byDamita Dunnings D on: 09/05/2020 04:47 PM   Modules accepted: Orders

## 2020-09-07 ENCOUNTER — Other Ambulatory Visit (HOSPITAL_BASED_OUTPATIENT_CLINIC_OR_DEPARTMENT_OTHER): Payer: Self-pay

## 2020-09-07 MED ORDER — PFIZER-BIONT COVID-19 VAC-TRIS 30 MCG/0.3ML IM SUSP
INTRAMUSCULAR | 0 refills | Status: DC
  Filled 2020-09-07: qty 0.3, 1d supply, fill #0

## 2020-10-22 ENCOUNTER — Other Ambulatory Visit: Payer: Self-pay | Admitting: Urology

## 2020-10-23 ENCOUNTER — Telehealth: Payer: Self-pay | Admitting: Pulmonary Disease

## 2020-10-23 NOTE — Telephone Encounter (Signed)
Fax received from Dr. Diona Fanti to perform a removal of a bladder tumor on patient.  Patient needs surgery clearance. Surgery scheduled for 8/3-8/42022. Patient was just seen 08/27/20. Office protocol is a risk assessment can be sent to surgeon if patient has been seen in 60 days or less.   Sending to Dr. Silas Flood for risk assessment or recommendations if patient needs to be seen in office prior to surgical procedure.

## 2020-10-23 NOTE — Telephone Encounter (Signed)
  Preoperative evaluation: I do not provide clearance but preoperative risk assessment.  Based on age, O2 saturation at recent PCP visit, hemoglobin greater than 10 on recent check, no recent upper respiratory infection, peripheral nature of surgery, and duration presume less than 2 hours, the patient has a low (1.3%) risk of pulmonary complication based on ARISCAT model.  Based on the same model, his maximum risk would be intermediate if surgery is greater than 3 hours, this pertains a 13% risk of pulmonary complication.  His significant parenchymal disease may provide higher risk than predicted. In fact he had a post operative pulmonary complication in May of 4859. Would highly recommend spinal or regional anesthesia to avoid general anesthesia and time on the ventilator if possible.  --Use regional, spinal anesthesia if able --Avoid neuromuscular blockade if general anesthesia is needed --Provide duo nebs preoperatively and in the PACU as well as every 6 hours as needed if patient remains admitted after surgery --Recommend arterial line for intraoperative blood gases if general anesthesia is needed --If patient is retaining CO2 during procedure, recommend postoperative postextubation blood gas as well to evaluate for CO2 retention --Given emphysema, patient should be extubated to BiPAP --If patient is admitted to the hospital, I recommend stepdown or progressive level of care which can be de-escalated if stable on POD1 --If patient is admitted to the hospital, recommend nebulized budesonide twice daily, formoterol or arformoterol nebulized twice daily, Yupelri nebulized daily for maximum inhaled therapy for underlying lung disease  I do not require a office visit prior to surgery. If patient or surgeon would prefer he be seen, I am happy to see him at any time.

## 2020-10-23 NOTE — Telephone Encounter (Signed)
Risk assessment printed along with last OV and faxed to [facility].   Nothing further needed at this time.

## 2020-11-02 ENCOUNTER — Other Ambulatory Visit: Payer: Self-pay | Admitting: Family

## 2020-11-07 ENCOUNTER — Encounter (HOSPITAL_COMMUNITY): Payer: Self-pay | Admitting: Urology

## 2020-11-07 ENCOUNTER — Other Ambulatory Visit: Payer: Self-pay

## 2020-11-07 ENCOUNTER — Observation Stay (HOSPITAL_COMMUNITY)
Admission: RE | Admit: 2020-11-07 | Discharge: 2020-11-09 | Disposition: A | Discharge status: Home / Self Care | Payer: Medicare HMO | Attending: Urology | Admitting: Urology

## 2020-11-07 DIAGNOSIS — J449 Chronic obstructive pulmonary disease, unspecified: Secondary | ICD-10-CM | POA: Diagnosis not present

## 2020-11-07 DIAGNOSIS — C678 Malignant neoplasm of overlapping sites of bladder: Secondary | ICD-10-CM

## 2020-11-07 DIAGNOSIS — E1159 Type 2 diabetes mellitus with other circulatory complications: Secondary | ICD-10-CM | POA: Diagnosis present

## 2020-11-07 DIAGNOSIS — Z20822 Contact with and (suspected) exposure to covid-19: Secondary | ICD-10-CM | POA: Insufficient documentation

## 2020-11-07 DIAGNOSIS — Z87891 Personal history of nicotine dependence: Secondary | ICD-10-CM | POA: Diagnosis not present

## 2020-11-07 DIAGNOSIS — E119 Type 2 diabetes mellitus without complications: Secondary | ICD-10-CM

## 2020-11-07 DIAGNOSIS — I10 Essential (primary) hypertension: Secondary | ICD-10-CM | POA: Insufficient documentation

## 2020-11-07 DIAGNOSIS — E785 Hyperlipidemia, unspecified: Secondary | ICD-10-CM | POA: Diagnosis present

## 2020-11-07 DIAGNOSIS — J439 Emphysema, unspecified: Secondary | ICD-10-CM | POA: Diagnosis present

## 2020-11-07 DIAGNOSIS — C673 Malignant neoplasm of anterior wall of bladder: Secondary | ICD-10-CM | POA: Diagnosis not present

## 2020-11-07 DIAGNOSIS — E1169 Type 2 diabetes mellitus with other specified complication: Secondary | ICD-10-CM | POA: Diagnosis present

## 2020-11-07 DIAGNOSIS — C679 Malignant neoplasm of bladder, unspecified: Secondary | ICD-10-CM | POA: Diagnosis present

## 2020-11-07 DIAGNOSIS — J9611 Chronic respiratory failure with hypoxia: Secondary | ICD-10-CM | POA: Diagnosis not present

## 2020-11-07 LAB — BASIC METABOLIC PANEL
Anion gap: 9 (ref 5–15)
BUN: 32 mg/dL — ABNORMAL HIGH (ref 8–23)
CO2: 29 mmol/L (ref 22–32)
Calcium: 9.3 mg/dL (ref 8.9–10.3)
Chloride: 102 mmol/L (ref 98–111)
Creatinine, Ser: 1.91 mg/dL — ABNORMAL HIGH (ref 0.61–1.24)
GFR, Estimated: 34 mL/min — ABNORMAL LOW (ref >60)
Glucose, Bld: 113 mg/dL — ABNORMAL HIGH (ref 70–99)
Potassium: 4.8 mmol/L (ref 3.5–5.1)
Sodium: 140 mmol/L (ref 135–145)

## 2020-11-07 LAB — SARS CORONAVIRUS 2 BY RT PCR (HOSPITAL ORDER, PERFORMED IN ~~LOC~~ HOSPITAL LAB): SARS Coronavirus 2: NEGATIVE

## 2020-11-07 LAB — HEMOGLOBIN AND HEMATOCRIT, BLOOD
HCT: 39.7 % (ref 39.0–52.0)
Hemoglobin: 12.3 g/dL — ABNORMAL LOW (ref 13.0–17.0)

## 2020-11-07 LAB — GLUCOSE, CAPILLARY: Glucose-Capillary: 164 mg/dL — ABNORMAL HIGH (ref 70–99)

## 2020-11-07 MED ORDER — FERROUS FUMARATE 324 (106 FE) MG PO TABS
1.0000 | ORAL_TABLET | Freq: Two times a day (BID) | ORAL | Status: DC
  Administered 2020-11-07 – 2020-11-09 (×3): 106 mg via ORAL
  Filled 2020-11-07 (×5): qty 1

## 2020-11-07 MED ORDER — CLONIDINE HCL 0.1 MG PO TABS
0.1000 mg | ORAL_TABLET | Freq: Two times a day (BID) | ORAL | Status: DC
  Administered 2020-11-07 – 2020-11-09 (×3): 0.1 mg via ORAL
  Filled 2020-11-07 (×3): qty 1

## 2020-11-07 MED ORDER — IPRATROPIUM-ALBUTEROL 0.5-2.5 (3) MG/3ML IN SOLN
3.0000 mL | Freq: Four times a day (QID) | RESPIRATORY_TRACT | Status: DC
  Administered 2020-11-07 – 2020-11-08 (×4): 3 mL via RESPIRATORY_TRACT
  Filled 2020-11-07 (×5): qty 3

## 2020-11-07 MED ORDER — ZOLPIDEM TARTRATE 5 MG PO TABS: 5.0000 mg | ORAL_TABLET | Freq: Every evening | ORAL | Status: DC | PRN

## 2020-11-07 MED ORDER — FUROSEMIDE 20 MG PO TABS
20.0000 mg | ORAL_TABLET | Freq: Every day | ORAL | Status: DC
  Administered 2020-11-09: 20 mg via ORAL
  Filled 2020-11-07: qty 1

## 2020-11-07 MED ORDER — AMLODIPINE BESYLATE 10 MG PO TABS
10.0000 mg | ORAL_TABLET | Freq: Every day | ORAL | Status: DC
  Administered 2020-11-09: 10 mg via ORAL
  Filled 2020-11-07: qty 1

## 2020-11-07 MED ORDER — ATORVASTATIN CALCIUM 40 MG PO TABS
80.0000 mg | ORAL_TABLET | Freq: Every day | ORAL | Status: DC
  Administered 2020-11-09: 80 mg via ORAL
  Filled 2020-11-07: qty 2

## 2020-11-07 MED ORDER — ALBUTEROL SULFATE (2.5 MG/3ML) 0.083% IN NEBU
2.5000 mg | INHALATION_SOLUTION | RESPIRATORY_TRACT | Status: DC | PRN
  Administered 2020-11-07: 2.5 mg via RESPIRATORY_TRACT
  Filled 2020-11-07: qty 3

## 2020-11-07 MED ORDER — INSULIN ASPART 100 UNIT/ML IJ SOLN
0.0000 [IU] | INTRAMUSCULAR | Status: DC
  Administered 2020-11-07: 2 [IU] via SUBCUTANEOUS
  Administered 2020-11-08: 1 [IU] via SUBCUTANEOUS

## 2020-11-07 MED ORDER — CEFAZOLIN SODIUM-DEXTROSE 2-4 GM/100ML-% IV SOLN
2.0000 g | INTRAVENOUS | Status: AC
  Administered 2020-11-08: 2 g via INTRAVENOUS
  Filled 2020-11-07: qty 100

## 2020-11-07 MED ORDER — SENNA 8.6 MG PO TABS
1.0000 | ORAL_TABLET | Freq: Two times a day (BID) | ORAL | Status: DC
  Administered 2020-11-07 – 2020-11-09 (×3): 8.6 mg via ORAL
  Filled 2020-11-07 (×3): qty 1

## 2020-11-07 MED ORDER — ORAL CARE MOUTH RINSE
15.0000 mL | Freq: Two times a day (BID) | OROMUCOSAL | Status: DC
  Administered 2020-11-07 – 2020-11-09 (×3): 15 mL via OROMUCOSAL

## 2020-11-07 MED ORDER — POLYETHYLENE GLYCOL 3350 17 G PO PACK
17.0000 g | PACK | Freq: Every day | ORAL | Status: DC
  Administered 2020-11-09: 17 g via ORAL
  Filled 2020-11-07: qty 1

## 2020-11-07 MED ORDER — DOXAZOSIN MESYLATE 2 MG PO TABS
4.0000 mg | ORAL_TABLET | Freq: Every day | ORAL | Status: DC
  Administered 2020-11-07 – 2020-11-09 (×2): 4 mg via ORAL
  Filled 2020-11-07 (×2): qty 2

## 2020-11-07 MED ORDER — PIOGLITAZONE HCL 45 MG PO TABS: 45.0000 mg | ORAL_TABLET | Freq: Every day | ORAL | Status: DC

## 2020-11-07 MED ORDER — CARVEDILOL 3.125 MG PO TABS
3.1250 mg | ORAL_TABLET | Freq: Two times a day (BID) | ORAL | Status: DC
  Administered 2020-11-08 – 2020-11-09 (×3): 3.125 mg via ORAL
  Filled 2020-11-07 (×3): qty 1

## 2020-11-07 MED ORDER — PREDNISONE 20 MG PO TABS
20.0000 mg | ORAL_TABLET | Freq: Every day | ORAL | Status: DC | PRN
  Administered 2020-11-08: 20 mg via ORAL
  Filled 2020-11-07: qty 1

## 2020-11-07 NOTE — Consult Note (Signed)
[facility] Medical Consultation  Bradley Johns [id number] DOB: 08/06/1934 DOA: 11/07/2020 PCP: Colon Branch, MD   Requesting physician: Franchot Gallo MD (urology) Date of consultation: 11/07/2020 Reason for consultation: Medical management  Impression/Recommendations Principal Problem:   Malignant neoplasm of overlapping sites of bladder [facility]) Active Problems:   Diabetes mellitus type 2 in nonobese [facility])   Hyperlipidemia   Essential hypertension   COPD with emphysema, on O2   Chronic respiratory failure with hypoxia ([place])   High-grade nonmuscle invasive bladder cancer: Admitted under urology service for planned TURBT 11/08/2020.  COPD/pulmonary fibrosis with chronic hypoxic respiratory failure: Stable on home 3 L O2 via [place] at rest.  Uses up to 5 L O2 with exertion. -Continue supplemental O2 via [place] -Continue DuoNeb and as needed albuterol  CKD stage IIIb: Monitor, BMP pending.  Hypertension: Continue amlodipine, Coreg, clonidine, Lasix.  Chronic diastolic CHF: Euvolemic and asymptomatic on admission.  Continue Lasix 20 mg daily.  Type 2 diabetes: Hold Actos, place on sensitive SSI q4h.  Anemia of chronic disease and iron deficiency: Follow H&H.  Continue Feraheme.  Hyperlipidemia: Continue atorvastatin.  OSA: Not using CPAP at home.  Continue supplemental O2 via [place].  TRH will followup again tomorrow. Please contact us if we can be of assistance in the meanwhile. Thank you for this consultation.  Chief Complaint: Bladder cancer  HPI:  Bradley Johns is a 86 y.o. male with medical history significant for COPD, chronic respiratory failure with hypoxia on 3-5 L O2 via [place], CKD stage IIIb, T2DM, HTN, HLD, chronic diastolic CHF (EF [id number]), anemia of chronic disease and iron deficiency, OSA, and high-grade invasive bladder cancer who previously underwent TURBT 08/02/2020 and is now admitted for repeat TURBT planned for 11/08/2020.  After previous TURBT in  April, he had postoperative complications requiring prolonged hospitalization due to acute on chronic hypoxic respiratory failure related to exacerbation of COPD and CHF in setting of accelerated hypertension.  He is admitted under the urology service for repeat TURBT after persistent carcinoma noted on recent cystoscopy.  The hospitalist service is consulted for assistance with medical management while in hospital.  Patient currently feels near his baseline.  He denies any significant shortness of breath, chest pain, abdominal pain, dysuria, or lower extremity edema.  Review of Systems:  All systems reviewed and are negative except as documented in history of present illness above.  Past Medical History:  Diagnosis Date   Anemia    iron   Arthritis    Benign prostatic hypertrophy    COPD (chronic obstructive pulmonary disease) ([place])    24/7 O2   Diabetes mellitus    per Dr Posey Pronto   Epidermal cyst of face    left lower lateral cheek   Gout    History of colon polyps    History of kidney stones 1980s   Hyperlipidemia    Hypertension    Kidney disease    Pulmonary fibrosis ([place]) 2014   Sleep apnea    Urothelial carcinoma ([place]) 05/24/2020   Past Surgical History:  Procedure Laterality Date   COLONOSCOPY WITH PROPOFOL N/A 05/18/2018   Procedure: COLONOSCOPY WITH PROPOFOL;  Surgeon: Carol Ada, MD;  [facility];  Service: Endoscopy;  Laterality: N/A;   POLYPECTOMY  05/18/2018   Procedure: POLYPECTOMY;  Surgeon: Carol Ada, MD;  [facility];  Service: Endoscopy;;   TOE SURGERY     TRANSURETHRAL RESECTION OF BLADDER TUMOR WITH MITOMYCIN-C N/A 08/02/2020   Procedure: TRANSURETHRAL RESECTION OF BLADDER  TUMOR;  Surgeon: Franchot Gallo, MD;  [facility];  Service: Urology;  Laterality: N/A;  1 HR   Social History:  reports that he quit smoking about 24 years ago. His smoking use included cigarettes. He has a 60.00 pack-year smoking history. He has never  used smokeless tobacco. He reports current alcohol use. He reports that he does not use drugs.  No Known Allergies Family History  Problem Relation Age of Onset   Liver disease Mother    Coronary artery disease Father 48   Diabetes Sister    Cancer Brother        type?   Hypertension Neg Hx    Hyperlipidemia Neg Hx    Heart attack Neg Hx    Prostate cancer Neg Hx    Colon cancer Neg Hx     Prior to Admission medications   Medication Sig Start Date End Date Taking? Authorizing Provider  albuterol (VENTOLIN HFA) 108 (90 Base) MCG/ACT inhaler Inhale 2 puffs into the lungs every 4 (four) hours as needed for wheezing or shortness of breath. 08/08/20  Yes Shelly Coss, MD  amLODipine (NORVASC) 10 MG tablet Take 1 tablet (10 mg total) by mouth daily. 08/09/20  Yes Shelly Coss, MD  atorvastatin (LIPITOR) 80 MG tablet Take 1 tablet (80 mg total) by mouth daily. Patient taking differently: Take 80 mg by mouth every evening. 04/15/17  Yes Paz, Alda Berthold, MD  cholecalciferol (VITAMIN D3) 25 MCG (1000 UNIT) tablet Take 1,000 Units by mouth daily.   Yes [provider]  cloNIDine (CATAPRES) 0.1 MG tablet Take 1 tablet (0.1 mg total) by mouth 2 (two) times daily. 08/08/20  Yes Shelly Coss, MD  colchicine 0.6 MG tablet Take 1 tablet (0.6 mg total) by mouth 2 (two) times daily as needed. Patient taking differently: Take 0.6 mg by mouth 2 (two) times daily as needed (gout). 0000000  Yes Delora Fuel, MD  doxazosin (CARDURA) 4 MG tablet Take 1 tablet (4 mg total) by mouth at bedtime. 03/01/19  Yes Paz, Alda Berthold, MD  ferrous fumarate (HEMOCYTE - 106 MG FE) 325 (106 Fe) MG TABS tablet Take 1 tablet (106 mg of iron total) by mouth 2 (two) times daily. 09/05/20  Yes Paz, Alda Berthold, MD  furosemide (LASIX) 20 MG tablet Take 20 mg by mouth daily.   Yes [provider]  ipratropium-albuterol (DUONEB) 0.5-2.5 (3) MG/3ML SOLN Take 3 mLs by nebulization in the morning and at bedtime.   Yes [provider]  ketoconazole (NIZORAL) 2 % cream Apply 1 application topically daily. Patient taking differently: Apply 1 application topically daily as needed (moisture around groin). 03/24/18  Yes Paz, Alda Berthold, MD  MAGNESIUM PO Take 1 tablet by mouth daily.   Yes [provider]  Multiple Vitamin (MULTIVITAMIN) tablet Take 1 tablet by mouth daily.   Yes [provider]  OXYGEN Inhale 3 L into the lungs continuous.   Yes [provider]  pioglitazone (ACTOS) 45 MG tablet Take 45 mg by mouth daily.   Yes [provider]  polyethylene glycol (MIRALAX / GLYCOLAX) 17 g packet Take 17 g by mouth daily. 08/08/20  Yes Shelly Coss, MD  predniSONE (DELTASONE) 20 MG tablet Take 20 mg by mouth daily as needed (gout).   Yes [provider]  sodium chloride (OCEAN) 0.65 % SOLN nasal spray Place 1 spray into both nostrils as needed for congestion. 04/13/20  Yes [place] Eric J, DO  carvedilol (COREG) 3.125 MG tablet  Take 1 tablet (3.125 mg total) by mouth 2 (two) times daily with a meal. 11/02/20   Colon Branch, MD  COVID-19 mRNA Vac-TriS, Pfizer, (PFIZER-BIONT COVID-19 VAC-TRIS) SUSP injection Inject into the muscle. 08/30/20   Carlyle Basques, MD  mometasone-formoterol [facility]) 100-5 MCG/ACT AERO Inhale 2 puffs into the lungs 2 (two) times daily. Patient not taking: No sig reported 08/08/20   Shelly Coss, MD  saline (AYR) GEL Place 1 application into both nostrils every 12 (twelve) hours. Patient not taking: Reported on 11/01/2020 04/13/20   [place] Donnamarie Poag, DO  senna (SENOKOT) 8.6 MG TABS tablet Take 1 tablet (8.6 mg total) by mouth 2 (two) times daily. Patient not taking: No sig reported 08/08/20   Shelly Coss, MD   Physical Exam: Blood pressure (!) 162/59, pulse 81, temperature 97.6 F (36.4 C), temperature source Oral, resp. rate 19, height '5\' 8"'$  (1.727 m), weight 79.6 kg, SpO2 90 %. Vitals:   11/07/20 1755  BP: (!) 162/59  Pulse: 81  Resp: 19  Temp: 97.6  F (36.4 C)  SpO2: 90%   Constitutional: Resting in bed, NAD, calm, comfortable Eyes: PERRL, lids and conjunctivae normal ENMT: Mucous membranes are moist. Posterior pharynx clear of any exudate or lesions.Normal dentition.  Neck: normal, supple, no masses. Respiratory: Bibasilar inspiratory crackles otherwise clear to auscultation.  Normal respiratory effort. No accessory muscle use.  Cardiovascular: Regular rate and rhythm, no murmurs / rubs / gallops. No extremity edema. 2+ pedal pulses. Abdomen: no tenderness, no masses palpated. No hepatosplenomegaly. Bowel sounds positive.  Musculoskeletal: no clubbing / cyanosis. No joint deformity upper and lower extremities. Good ROM, no contractures. Normal muscle tone.  Skin: no rashes, lesions, ulcers. No induration Neurologic: CN 2-12 grossly intact. Sensation intact. Strength 5/5 in all 4.  Psychiatric: Normal judgment and insight. Alert and oriented x 3. Normal mood.   Labs on Admission:  Basic Metabolic Panel: No results for input(s): NA, K, CL, CO2, GLUCOSE, BUN, CREATININE, CALCIUM, MG, PHOS in the last 168 hours. Liver Function Tests: No results for input(s): AST, ALT, ALKPHOS, BILITOT, PROT, ALBUMIN in the last 168 hours. No results for input(s): LIPASE, AMYLASE in the last 168 hours. No results for input(s): AMMONIA in the last 168 hours. CBC: Recent Labs  Lab 11/07/20 1803  HGB 12.3*  HCT 39.7   Cardiac Enzymes: No results for input(s): CKTOTAL, CKMB, CKMBINDEX, TROPONINI in the last 168 hours. BNP: Invalid input(s): POCBNP CBG: No results for input(s): GLUCAP in the last 168 hours.  Radiological Exams on Admission: No results found.  EKG: Not performed.   Zada Finders, MD [facility] If 7PM-7AM, please contact night-coverage [email or web address] 11/07/2020, 6:52 PM

## 2020-11-07 NOTE — H&P (Signed)
H&P  Chief Complaint: Bladder cancer  History of Present Illness: 86 year old male with multiple medical problems including COPD, pulmonary fibrosis, chronic renal insufficiency, hypertension, diabetes and obstructive sleep apnea.  The patient also has high-grade nonmuscle invasive bladder cancer.  He underwent TURBT in late April of this year.  He had significant hypertension postoperatively, and ended up spending quite a few days in the hospital after this relatively limited procedure.  He does have persistent carcinoma, found on recent cystoscopy.  He presents at this time to be admitted the night before his procedure for medication management in preparation of repeat TURBT on 11/08/2020.  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    Past Surgical History:  Procedure Laterality Date   COLONOSCOPY WITH PROPOFOL N/A 05/18/2018   Procedure: COLONOSCOPY WITH PROPOFOL;  Surgeon: Carol Ada, MD;  Location: [facility];  Service: Endoscopy;  Laterality: N/A;   POLYPECTOMY  05/18/2018   Procedure: POLYPECTOMY;  Surgeon: Carol Ada, MD;  Location: [facility];  Service: Endoscopy;;   TOE SURGERY     TRANSURETHRAL RESECTION OF BLADDER TUMOR WITH MITOMYCIN-C N/A 08/02/2020   Procedure: TRANSURETHRAL RESECTION OF BLADDER TUMOR;  Surgeon: Franchot Gallo, MD;  Location: [facility];  Service: Urology;  Laterality: N/A;  1 HR    Home Medications:    Allergies: No Known Allergies  Family History  Problem Relation Age of Onset   Liver disease Mother    Coronary artery  disease Father 88   Diabetes Sister    Cancer Brother        type?   Hypertension Neg Hx    Hyperlipidemia Neg Hx    Heart attack Neg Hx    Prostate cancer Neg Hx    Colon cancer Neg Hx     Social History:  reports that he quit smoking about 24 years ago. His smoking use included cigarettes. He has a 60.00 pack-year smoking history. He has never used smokeless tobacco. He reports current alcohol use. He reports that he does not use drugs.  ROS: A complete review of systems was performed.  All systems are negative except for pertinent findings as noted.  Physical Exam:  Vital signs in last 24 hours: There were no vitals taken for this visit. Constitutional:  Alert and oriented, No acute distress.  He does have nasal cannula oxygen on. Cardiovascular: Regular rate  Respiratory: Use of accessory muscles present. Lymphatic: No lymphadenopathy Neurologic: Grossly intact, no focal deficits Psychiatric: Normal mood and affect  Laboratory Data:  No results for input(s): WBC, HGB, HCT, PLT in the last 72 hours.  No results for input(s): NA, K, CL, GLUCOSE, BUN, CALCIUM, CREATININE in the last 72 hours.  Invalid input(s): CO3   No results found for this or any previous visit (from the past 24 hour(s)). No results found for this or any previous visit (from the past 240 hour(s)).  Renal Function: No results for input(s):  CREATININE in the last 168 hours. CrCl cannot be calculated (Patient's most recent lab result is older than the maximum 21 days allowed.).  Radiologic Imaging: No results found.  Impression/Assessment:  High-grade nonmuscle invasive bladder cancer  Pulmonary fibrosis, COPD, obstructive sleep apnea  History of extended hospitalization after his first TURBT  Plan:  He is to be admitted tonight for medication management in advance of his procedure tomorrow  I have asked the hospital service to consult on him to assist with preparing him for his procedure

## 2020-11-07 NOTE — Plan of Care (Signed)
  Problem: Education: Goal: Knowledge of General Education information will improve Description: Including pain rating scale, medication(s)/side effects and non-pharmacologic comfort measures Outcome: Completed/Met

## 2020-11-08 ENCOUNTER — Encounter (HOSPITAL_COMMUNITY)
Admission: RE | Disposition: A | Discharge status: Home / Self Care | Payer: Self-pay | Source: Home / Self Care | Attending: Urology

## 2020-11-08 ENCOUNTER — Observation Stay (HOSPITAL_COMMUNITY): Payer: Medicare HMO | Admitting: Anesthesiology

## 2020-11-08 ENCOUNTER — Encounter (HOSPITAL_COMMUNITY): Payer: Self-pay | Admitting: Urology

## 2020-11-08 DIAGNOSIS — Z20822 Contact with and (suspected) exposure to covid-19: Secondary | ICD-10-CM | POA: Diagnosis not present

## 2020-11-08 DIAGNOSIS — J9611 Chronic respiratory failure with hypoxia: Secondary | ICD-10-CM | POA: Diagnosis not present

## 2020-11-08 DIAGNOSIS — E785 Hyperlipidemia, unspecified: Secondary | ICD-10-CM

## 2020-11-08 DIAGNOSIS — C673 Malignant neoplasm of anterior wall of bladder: Secondary | ICD-10-CM | POA: Diagnosis not present

## 2020-11-08 DIAGNOSIS — E119 Type 2 diabetes mellitus without complications: Secondary | ICD-10-CM | POA: Diagnosis not present

## 2020-11-08 DIAGNOSIS — J449 Chronic obstructive pulmonary disease, unspecified: Secondary | ICD-10-CM | POA: Diagnosis not present

## 2020-11-08 DIAGNOSIS — I10 Essential (primary) hypertension: Secondary | ICD-10-CM | POA: Diagnosis not present

## 2020-11-08 DIAGNOSIS — J439 Emphysema, unspecified: Secondary | ICD-10-CM | POA: Diagnosis not present

## 2020-11-08 DIAGNOSIS — C678 Malignant neoplasm of overlapping sites of bladder: Secondary | ICD-10-CM | POA: Diagnosis not present

## 2020-11-08 HISTORY — PX: TRANSURETHRAL RESECTION OF BLADDER TUMOR WITH MITOMYCIN-C: SHX6459

## 2020-11-08 LAB — BASIC METABOLIC PANEL
Anion gap: 7 (ref 5–15)
BUN: 30 mg/dL — ABNORMAL HIGH (ref 8–23)
CO2: 27 mmol/L (ref 22–32)
Calcium: 8.9 mg/dL (ref 8.9–10.3)
Chloride: 105 mmol/L (ref 98–111)
Creatinine, Ser: 1.7 mg/dL — ABNORMAL HIGH (ref 0.61–1.24)
GFR, Estimated: 39 mL/min — ABNORMAL LOW (ref >60)
Glucose, Bld: 99 mg/dL (ref 70–99)
Potassium: 4.5 mmol/L (ref 3.5–5.1)
Sodium: 139 mmol/L (ref 135–145)

## 2020-11-08 LAB — GLUCOSE, CAPILLARY
Glucose-Capillary: 108 mg/dL — ABNORMAL HIGH (ref 70–99)
Glucose-Capillary: 111 mg/dL — ABNORMAL HIGH (ref 70–99)
Glucose-Capillary: 121 mg/dL — ABNORMAL HIGH (ref 70–99)
Glucose-Capillary: 131 mg/dL — ABNORMAL HIGH (ref 70–99)
Glucose-Capillary: 143 mg/dL — ABNORMAL HIGH (ref 70–99)
Glucose-Capillary: 147 mg/dL — ABNORMAL HIGH (ref 70–99)
Glucose-Capillary: 61 mg/dL — ABNORMAL LOW (ref 70–99)
Glucose-Capillary: 91 mg/dL (ref 70–99)

## 2020-11-08 SURGERY — TRANSURETHRAL RESECTION OF BLADDER TUMOR WITH MITOMYCIN-C
Anesthesia: Monitor Anesthesia Care | Site: Urethra

## 2020-11-08 MED ORDER — ONDANSETRON HCL 4 MG/2ML IJ SOLN
4.0000 mg | Freq: Once | INTRAMUSCULAR | Status: AC | PRN
  Administered 2020-11-08: 4 mg via INTRAVENOUS

## 2020-11-08 MED ORDER — CHLORHEXIDINE GLUCONATE CLOTH 2 % EX PADS: 6.0000 | MEDICATED_PAD | Freq: Every day | CUTANEOUS | Status: DC

## 2020-11-08 MED ORDER — BELLADONNA ALKALOIDS-OPIUM 16.2-60 MG RE SUPP
1.0000 | Freq: Once | RECTAL | Status: DC
Start: 2020-11-08 — End: 2020-11-08

## 2020-11-08 MED ORDER — INSULIN ASPART 100 UNIT/ML IJ SOLN
0.0000 [IU] | Freq: Three times a day (TID) | INTRAMUSCULAR | Status: DC
  Administered 2020-11-09: 1 [IU] via SUBCUTANEOUS

## 2020-11-08 MED ORDER — BELLADONNA ALKALOIDS-OPIUM 16.2-30 MG RE SUPP: 1.0000 | Freq: Once | RECTAL | Status: DC

## 2020-11-08 MED ORDER — PROPOFOL 1000 MG/100ML IV EMUL
INTRAVENOUS | Status: AC
  Filled 2020-11-08: qty 100

## 2020-11-08 MED ORDER — OXYCODONE HCL 5 MG PO TABS
5.0000 mg | ORAL_TABLET | Freq: Once | ORAL | Status: AC | PRN
  Administered 2020-11-08: 5 mg via ORAL

## 2020-11-08 MED ORDER — PROPOFOL 500 MG/50ML IV EMUL
INTRAVENOUS | Status: DC | PRN
  Administered 2020-11-08: 50 ug/kg/min via INTRAVENOUS

## 2020-11-08 MED ORDER — IPRATROPIUM-ALBUTEROL 0.5-2.5 (3) MG/3ML IN SOLN
3.0000 mL | Freq: Two times a day (BID) | RESPIRATORY_TRACT | Status: DC
  Administered 2020-11-09: 3 mL via RESPIRATORY_TRACT
  Filled 2020-11-08: qty 3

## 2020-11-08 MED ORDER — MOMETASONE FURO-FORMOTEROL FUM 100-5 MCG/ACT IN AERO
2.0000 | INHALATION_SPRAY | Freq: Two times a day (BID) | RESPIRATORY_TRACT | Status: DC
  Administered 2020-11-08: 2 via RESPIRATORY_TRACT
  Filled 2020-11-08: qty 8.8

## 2020-11-08 MED ORDER — ONDANSETRON HCL 4 MG/2ML IJ SOLN
INTRAMUSCULAR | Status: DC | PRN
  Administered 2020-11-08: 4 mg via INTRAVENOUS

## 2020-11-08 MED ORDER — FLUTICASONE PROPIONATE 50 MCG/ACT NA SUSP
2.0000 | Freq: Every day | NASAL | Status: DC
  Administered 2020-11-09: 2 via NASAL
  Filled 2020-11-08: qty 16

## 2020-11-08 MED ORDER — OXYCODONE HCL 5 MG PO TABS
ORAL_TABLET | ORAL | Status: AC
  Filled 2020-11-08: qty 1

## 2020-11-08 MED ORDER — BELLADONNA ALKALOIDS-OPIUM 16.2-60 MG RE SUPP
RECTAL | Status: AC
  Administered 2020-11-08: 1 via RECTAL
  Filled 2020-11-08: qty 1

## 2020-11-08 MED ORDER — ONDANSETRON HCL 4 MG/2ML IJ SOLN
INTRAMUSCULAR | Status: AC
  Filled 2020-11-08: qty 2

## 2020-11-08 MED ORDER — PANTOPRAZOLE SODIUM 40 MG PO TBEC
40.0000 mg | DELAYED_RELEASE_TABLET | Freq: Every day | ORAL | Status: DC
  Administered 2020-11-09: 40 mg via ORAL
  Filled 2020-11-08: qty 1

## 2020-11-08 MED ORDER — STERILE WATER FOR IRRIGATION IR SOLN
Status: DC | PRN
  Administered 2020-11-08 (×5): 3000 mL

## 2020-11-08 MED ORDER — LORATADINE 10 MG PO TABS
10.0000 mg | ORAL_TABLET | Freq: Every day | ORAL | Status: DC
  Administered 2020-11-09: 10 mg via ORAL
  Filled 2020-11-08: qty 1

## 2020-11-08 MED ORDER — TRAMADOL HCL 50 MG PO TABS
50.0000 mg | ORAL_TABLET | Freq: Four times a day (QID) | ORAL | Status: DC | PRN
  Administered 2020-11-08: 50 mg via ORAL
  Filled 2020-11-08: qty 1

## 2020-11-08 MED ORDER — OXYCODONE HCL 5 MG/5ML PO SOLN
5.0000 mg | Freq: Once | ORAL | Status: AC | PRN
Start: 2020-11-08 — End: 2020-11-08

## 2020-11-08 MED ORDER — BUPIVACAINE IN DEXTROSE 0.75-8.25 % IT SOLN
INTRATHECAL | Status: DC | PRN
  Administered 2020-11-08: 1.7 mL via INTRATHECAL

## 2020-11-08 MED ORDER — FENTANYL CITRATE (PF) 100 MCG/2ML IJ SOLN
INTRAMUSCULAR | Status: AC
  Filled 2020-11-08: qty 2

## 2020-11-08 MED ORDER — ACETAMINOPHEN 500 MG PO TABS
1000.0000 mg | ORAL_TABLET | Freq: Four times a day (QID) | ORAL | Status: DC
  Administered 2020-11-08 – 2020-11-09 (×4): 1000 mg via ORAL
  Filled 2020-11-08 (×4): qty 2

## 2020-11-08 MED ORDER — LACTATED RINGERS IV SOLN: INTRAVENOUS | Status: DC

## 2020-11-08 MED ORDER — FENTANYL CITRATE (PF) 100 MCG/2ML IJ SOLN
25.0000 ug | INTRAMUSCULAR | Status: DC | PRN
  Administered 2020-11-08: 25 ug via INTRAVENOUS

## 2020-11-08 MED ORDER — PROPOFOL 10 MG/ML IV BOLUS
INTRAVENOUS | Status: DC | PRN
  Administered 2020-11-08: 20 mg via INTRAVENOUS
  Administered 2020-11-08: 10 mg via INTRAVENOUS
  Administered 2020-11-08: 20 mg via INTRAVENOUS

## 2020-11-08 SURGICAL SUPPLY — 22 items
BAG URINE DRAIN 2000ML AR STRL (UROLOGICAL SUPPLIES) ×2 IMPLANT
BAG URO CATCHER STRL LF (MISCELLANEOUS) ×2 IMPLANT
CATH FOLEY 2WAY SLVR  5CC 20FR (CATHETERS) ×2
CATH FOLEY 2WAY SLVR 30CC 24FR (CATHETERS) IMPLANT
CATH FOLEY 2WAY SLVR 5CC 20FR (CATHETERS) ×1 IMPLANT
DRAPE FOOT SWITCH (DRAPES) ×2 IMPLANT
ELECT REM PT RETURN 15FT ADLT (MISCELLANEOUS) IMPLANT
EVACUATOR MICROVAS BLADDER (UROLOGICAL SUPPLIES) IMPLANT
GLOVE SURG ENC TEXT LTX SZ8 (GLOVE) ×2 IMPLANT
GOWN STRL REUS W/TWL XL LVL3 (GOWN DISPOSABLE) ×2 IMPLANT
KIT TURNOVER KIT A (KITS) ×2 IMPLANT
LOOP MONOPOLAR YLW (ELECTROSURGICAL) ×2 IMPLANT
MANIFOLD NEPTUNE II (INSTRUMENTS) ×2 IMPLANT
NDL SAFETY ECLIPSE 18X1.5 (NEEDLE) IMPLANT
NEEDLE HYPO 18GX1.5 SHARP (NEEDLE)
PACK CYSTO (CUSTOM PROCEDURE TRAY) ×2 IMPLANT
SYR TOOMEY IRRIG 70ML (MISCELLANEOUS)
SYRINGE TOOMEY IRRIG 70ML (MISCELLANEOUS) IMPLANT
TUBING CONNECTING 10 (TUBING) ×2 IMPLANT
TUBING UROLOGY SET (TUBING) ×2 IMPLANT
WATER STERILE IRR 3000ML UROMA (IV SOLUTION) ×2 IMPLANT
WATER STERILE IRR 500ML POUR (IV SOLUTION) ×2 IMPLANT

## 2020-11-08 NOTE — Anesthesia Procedure Notes (Addendum)
Spinal  Patient location during procedure: OR Start time: 11/08/2020 9:35 AM End time: 11/08/2020 9:45 AM Reason for block: surgical anesthesia Staffing Performed: anesthesiologist  Anesthesiologist: Merlinda Frederick, MD Other anesthesia staff: Milford Cage, CRNA Preanesthetic Checklist Completed: patient identified, IV checked, risks and benefits discussed, surgical consent, monitors and equipment checked, pre-op evaluation and timeout performed Spinal Block Patient position: sitting Prep: DuraPrep Patient monitoring: cardiac monitor, continuous pulse ox and blood pressure Approach: midline Location: L3-4 Injection technique: single-shot Needle Needle type: Pencan  Needle gauge: 24 G Needle length: 9 cm Assessment Events: CSF return and second provider Additional Notes Functioning IV was confirmed and monitors were applied. Sterile prep and drape, including hand hygiene and sterile gloves were used. The patient was positioned and the spine was prepped. The skin was anesthetized with lidocaine.  Free flow of clear CSF was obtained prior to injecting local anesthetic into the CSF.  The spinal needle aspirated freely following injection.  The needle was carefully withdrawn.  The patient tolerated the procedure well.

## 2020-11-08 NOTE — Addendum Note (Signed)
Addendum  created 11/08/20 1307 by Merlinda Frederick, MD   Order list changed, Pharmacy for encounter modified

## 2020-11-08 NOTE — Anesthesia Postprocedure Evaluation (Signed)
Anesthesia Post Note  Patient: Bradley Johns  Procedure(s) Performed: TRANSURETHRAL RESECTION OF BLADDER TUMOR (Urethra)     Patient location during evaluation: PACU Anesthesia Type: MAC Level of consciousness: oriented and awake and alert Pain management: pain level controlled Vital Signs Assessment: post-procedure vital signs reviewed and stable Respiratory status: spontaneous breathing, respiratory function stable and patient connected to nasal cannula oxygen Cardiovascular status: blood pressure returned to baseline and stable Postop Assessment: no headache, no backache, no apparent nausea or vomiting and spinal receding Anesthetic complications: no   No notable events documented.  Last Vitals:  Vitals:   11/08/20 1100 11/08/20 1130  BP: (!) 141/51   Pulse:    Resp:    Temp:    SpO2:  91%    Last Pain:  Vitals:   11/08/20 1050  TempSrc:   PainSc: 0-No pain                 Merlinda Frederick

## 2020-11-08 NOTE — Interval H&P Note (Signed)
History and Physical Interval Note:  11/08/2020 9:02 AM  Bradley Johns  has presented today for surgery, with the diagnosis of BLADDER CANCER.  The various methods of treatment have been discussed with the patient and family. After consideration of risks, benefits and other options for treatment, the patient has consented to  Procedure(s): TRANSURETHRAL RESECTION OF BLADDER TUMOR WITH GEMCITABINE (N/A) as a surgical intervention.  The patient's history has been reviewed, patient examined, no change in status, stable for surgery.  I have reviewed the patient's chart and labs.  Questions were answered to the patient's satisfaction.     Jamilyn Pigeon Abu-Salha

## 2020-11-08 NOTE — Progress Notes (Signed)
PROGRESS NOTE/consult follow-up    Bradley Johns  [id number] DOB: October 30, 1934 DOA: 11/07/2020 PCP: Colon Branch, MD    No chief complaint on file.   Brief Narrative:  Patient 86 year old gentleman history of COPD/chronic respiratory failure with hypoxia on 3 to 5 L O2 via nasal cannula, CKD stage IIIb, type 2 diabetes mellitus, hypertension, hyperlipidemia, chronic diastolic CHF, anemia of chronic disease/iron deficiency anemia, OSA, high-grade invasive bladder cancer underwent prior TURBT 08/02/2020 and admitted under urology service for repeat TURBT on 11/08/2020, hospitalist consulted for medical management while in-house.   Assessment & Plan:   Principal Problem:   Malignant neoplasm of overlapping sites of bladder [facility]) Active Problems:   Diabetes mellitus type 2 in nonobese ([place])   Hyperlipidemia   Essential hypertension   COPD with emphysema, on O2   Chronic respiratory failure with hypoxia ([facility])  #1 high-grade nonmuscle invasive bladder cancer -Patient underwent TURBT this morning 11/08/2020. -Per primary team.  2.  Pulmonary fibrosis with chronic respiratory failure with hypoxia/COPD -Currently stable on 3 L home O2. -Patient noted to require up to 5 L O2 on exertion. -Continue current scheduled duo nebs. -Placed on Dulera, Claritin, PPI. -Supportive care.  3.  Hypertension -Continue home regimen Norvasc, Coreg, clonidine, Cardura, Lasix.  4.  CKD stage IIIb -Stable. -Follow.  5.  Chronic diastolic CHF -Currently euvolemic/compensated. -Continue cardiac regimen of Coreg, Lasix, Norvasc.  6.  Diabetes mellitus type 2 -Hemoglobin A1c 6.8 (08/07/2020) -CBG at 91 this morning. -Continue to hold Actos. -Postoperatively change CBGs to before meals and at bedtime. -Continue SSI. -Follow-up.  7.  Anemia of chronic disease/iron deficiency anemia -Continue oral iron supplement. -Follow H&H.  8.  Hyperlipidemia -Statin.  9.  OSA -It is noted that patient not  using CPAP at home uses supplemental O2.    DVT prophylaxis: Per primary team. Code Status: Full Family Communication: Updated patient. Disposition:   Status is: Observation  The patient remains OBS appropriate and will d/c before 2 midnights.  Dispo: The patient is from: Home              Anticipated d/c is to: Home              Patient currently is not medically stable to d/c.   Difficult to place patient No       Consultants:  [facility] hospitalist: Dr. Posey Pronto 11/07/2020  Procedures:  Cystoscopy/transurethral resection of bladder tumor per Dr. Diona Fanti 11/08/2020  Antimicrobials:  IV Ancef preop 11/08/2020   Subjective: In PACU with emesis bag in hand. C/O lower abdominal pain. Some nausea. No CP. No SOB.   Objective: Vitals:   11/08/20 0850 11/08/20 1050 11/08/20 1100 11/08/20 1130  BP: (!) 158/78 (!) 90/51 (!) 141/51   Pulse: 83 63    Resp: 20 15    Temp: 97.6 F (36.4 C) 98 F (36.7 C)    TempSrc: Oral     SpO2: 93%   91%  Weight:      Height:        Intake/Output Summary (Last 24 hours) at 11/08/2020 1333 Last data filed at 11/08/2020 1052 Gross per 24 hour  Intake 850 ml  Output 10 ml  Net 840 ml   Filed Weights   11/07/20 1841  Weight: 79.6 kg    Examination:  General exam: Appears calm and comfortable  Respiratory system: Clear to auscultation anterior lung fields. Respiratory effort normal. Cardiovascular system: S1 & S2 heard, RRR. No JVD, murmurs, rubs, gallops or  clicks. No pedal edema. Gastrointestinal system: Abdomen is nondistended, soft and tender to palpation left lower quadrant.  Positive bowel sounds.  No rebound.  No guarding.  Central nervous system: Alert and oriented. No focal neurological deficits. Extremities: Symmetric 5 x 5 power. Skin: No rashes, lesions or ulcers Psychiatry: Judgement and insight appear normal. Mood & affect appropriate.     Data Reviewed: I have personally reviewed following labs and imaging  studies  CBC: Recent Labs  Lab 11/07/20 1803  HGB 12.3*  HCT [id number]    Basic Metabolic Panel: Recent Labs  Lab 11/07/20 1803 11/08/20 0506  NA 140 139  K 4.8 4.5  CL 102 105  CO2 29 27  GLUCOSE 113* 99  BUN 32* 30*  CREATININE 1.91* 1.70*  CALCIUM 9.3 8.9    GFR: Estimated Creatinine Clearance: 31.3 mL/min (A) (by C-G formula based on SCr of 1.7 mg/dL (H)).  Liver Function Tests: No results for input(s): AST, ALT, ALKPHOS, BILITOT, PROT, ALBUMIN in the last 168 hours.  CBG: Recent Labs  Lab 11/08/20 0000 11/08/20 0031 11/08/20 0441 11/08/20 0724 11/08/20 1055  GLUCAP 61* 108* 91 121* 111*     Recent Results (from the past 240 hour(s))  SARS Coronavirus 2 by RT PCR (hospital order, performed in [facility] hospital lab) Nasopharyngeal Nasopharyngeal Swab     Status: None   Collection Time: 11/07/20  5:53 PM   Specimen: Nasopharyngeal Swab  Result Value Ref Range Status   SARS Coronavirus 2 NEGATIVE NEGATIVE Final    Comment: (NOTE) SARS-CoV-2 target nucleic acids are NOT DETECTED.  The SARS-CoV-2 RNA is generally detectable in upper and lower respiratory specimens during the acute phase of infection. The lowest concentration of SARS-CoV-2 viral copies this assay can detect is 250 copies / mL. A negative result does not preclude SARS-CoV-2 infection and should not be used as the sole basis for treatment or other patient management decisions.  A negative result may occur with improper specimen collection / handling, submission of specimen other than nasopharyngeal swab, presence of viral mutation(s) within the areas targeted by this assay, and inadequate number of viral copies (<250 copies / mL). A negative result must be combined with clinical observations, patient history, and epidemiological information.  Fact Sheet for Patients:   StrictlyIdeas.no  Fact Sheet for Healthcare  Providers: BankingDealers.co.za  This test is not yet approved or  cleared by the [place] FDA and has been authorized for detection and/or diagnosis of SARS-CoV-2 by FDA under an Emergency Use Authorization (EUA).  This EUA will remain in effect (meaning this test can be used) for the duration of the COVID-19 declaration under Section 564(b)(1) of the Act, 21 U.S.C. section 360bbb-3(b)(1), unless the authorization is terminated or revoked sooner.  Performed at [facility], [place] [address] [place], [place] [address]          Radiology Studies: No results found.      Scheduled Meds:  [MAR Hold] amLODipine  10 mg Oral Daily   [MAR Hold] atorvastatin  80 mg Oral Daily   opium-belladonna  1 suppository Rectal Once   [MAR Hold] carvedilol  3.125 mg Oral BID WC   [MAR Hold] cloNIDine  0.1 mg Oral BID   [MAR Hold] doxazosin  4 mg Oral QHS   fentaNYL       [MAR Hold] Ferrous Fumarate  1 tablet Oral BID   [MAR Hold] fluticasone  2 spray Each Nare Daily   [MAR Hold] furosemide  20 mg Oral Daily   [MAR Hold] insulin aspart  0-9 Units Subcutaneous Q4H   [MAR Hold] ipratropium-albuterol  3 mL Nebulization Q6H   [MAR Hold] loratadine  10 mg Oral Daily   [MAR Hold] mouth rinse  15 mL Mouth Rinse BID   [MAR Hold] mometasone-formoterol  2 puff Inhalation BID   opium-belladonna       oxyCODONE       [MAR Hold] pantoprazole  40 mg Oral Q0600   [MAR Hold] polyethylene glycol  17 g Oral Daily   [MAR Hold] senna  1 tablet Oral BID   Continuous Infusions:  lactated ringers Stopped (11/08/20 1052)     LOS: 0 days    Time spent: 35 minutes    Irine Seal, MD [facility]   To contact the attending provider between 7A-7P or the covering provider during after hours 7P-7A, please log into the web site [email or web address] and access using universal [place] password for that web site. If you do not have the password, please call  the hospital operator.  11/08/2020, 1:33 PM

## 2020-11-08 NOTE — Anesthesia Preprocedure Evaluation (Signed)
Anesthesia Evaluation    Airway Mallampati: II  TM Distance: >3 FB Neck ROM: Full    Dental  (+) Poor Dentition, Missing,  Several missing posterior teeth as well:   Pulmonary sleep apnea , COPD,  COPD inhaler and oxygen dependent, former smoker,  Pulmonary fibrosis   Pulmonary exam normal breath sounds clear to auscultation       Cardiovascular Exercise Tolerance: Poor hypertension, Normal cardiovascular exam Rhythm:Regular Rate:Normal   1. Left ventricular ejection fraction, by estimation, is 60 to 65%. The  left ventricle has normal function. The left ventricle has no regional  wall motion abnormalities. There is mild left ventricular hypertrophy.  Left ventricular diastolic parameters  are consistent with Grade I diastolic dysfunction (impaired relaxation).  2. Right ventricular systolic function is mildly reduced. The right  ventricular size is moderately enlarged. There is normal pulmonary artery  systolic pressure. The estimated right ventricular systolic pressure is  29.4 mmHg.  3. The mitral valve is normal in structure. Trivial mitral valve  regurgitation. No evidence of mitral stenosis.  4. The aortic valve is tricuspid. Aortic valve regurgitation is not  visualized. Mild aortic valve sclerosis is present, with no evidence of  aortic valve stenosis.  5. The inferior vena cava is normal in size with greater than 50%  respiratory variability, suggesting right atrial pressure of 3 mmHg.    Neuro/Psych    GI/Hepatic negative GI ROS,   Endo/Other  diabetes, Type 2  Renal/GU      Musculoskeletal  (+) Arthritis , Osteoarthritis,    Abdominal   Peds  Hematology  (+) anemia ,   Anesthesia Other Findings   Reproductive/Obstetrics                            Anesthesia Physical Anesthesia Plan  ASA: 4  Anesthesia Plan: Spinal and MAC   Post-op Pain Management:    Induction:  Intravenous  PONV Risk Score and Plan: 1 and Propofol infusion, TIVA and Treatment may vary due to age or medical condition  Airway Management Planned: Natural Airway and Simple Face Mask  Additional Equipment: None  Intra-op Plan:   Post-operative Plan:   Informed Consent: I have reviewed the patients History and Physical, chart, labs and discussed the procedure including the risks, benefits and alternatives for the proposed anesthesia with the patient or authorized representative who has indicated his/her understanding and acceptance.     Dental advisory given  Plan Discussed with: CRNA, Anesthesiologist and Surgeon  Anesthesia Plan Comments: (Severe pulmonary disease. Per pulmonology recs, will plan for spinal anesthesia. GA/LMA as backup plan. Norton Blizzard, MD  )        Anesthesia Quick Evaluation

## 2020-11-08 NOTE — Addendum Note (Signed)
Addendum  created 11/08/20 1332 by Merlinda Frederick, MD   Order list changed, Pharmacy for encounter modified

## 2020-11-08 NOTE — Plan of Care (Signed)

## 2020-11-08 NOTE — Transfer of Care (Signed)
Immediate Anesthesia Transfer of Care Note  Patient: Bradley Johns  Procedure(s) Performed: TRANSURETHRAL RESECTION OF BLADDER TUMOR (Urethra)  Patient Location: PACU  Anesthesia Type:Spinal  Level of Consciousness: awake  Airway & Oxygen Therapy: Patient Spontanous Breathing and Patient connected to nasal cannula oxygen  Post-op Assessment: Report given to RN and Post -op Vital signs reviewed and stable  Post vital signs: Reviewed and stable  Last Vitals:  Vitals Value Taken Time  BP 90/51 11/08/20 1050  Temp 36.7 C 11/08/20 1050  Pulse 55 11/08/20 1052  Resp 12 11/08/20 1052  SpO2 92 % 11/08/20 1052  Vitals shown include unvalidated device data.  Last Pain:  Vitals:   11/08/20 0850  TempSrc: Oral  PainSc:       Patients Stated Pain Goal: 0 ([phone] [id number])  Complications: No notable events documented.

## 2020-11-08 NOTE — Op Note (Addendum)
Preoperative diagnosis: Bladder tumor (~6cm)  Postoperative diagnosis:  Bladder tumor (~6cm)  Procedure:  Cystoscopy Transurethral resection of bladder tumor   Surgeon: William Dalton. M.D.  Assistant: Bishop Limbo, MD  Anesthesia: General  Complications: None  Intraoperative findings:  Bladder tumor: Papillary appearing bladder tumor interspersed but conglomerating along right bladder neck/trigone just lateral to the right ureteral orifice, long the right inferior posterior bladder wall (majority of tumor burden) and about 1cm of tumor burden long the anterior left bladder neck. All resected and sent together for specimen . A deep bladder tumor base specimen was sent form the posterior bladder wall tumor burden.   EBL: Minimal  Specimens: Superficial Bladder tumor Deep bladder tumor  Disposition of specimens: Pathology  Indication: Bradley Johns is a patient who was found to have a bladder tumor. After reviewing the management options for treatment, he elected to proceed with the above surgical procedure(s). We have discussed the potential benefits and risks of the procedure, side effects of the proposed treatment, the likelihood of the patient achieving the goals of the procedure, and any potential problems that might occur during the procedure or recuperation. Informed consent has been obtained.  Description of procedure:  The patient was taken to the operating room and spinal anesthesia was induced.  The patient was placed in the dorsal lithotomy position, prepped and draped in the usual sterile fashion, and preoperative antibiotics were administered. A preoperative time-out was performed.   Cystourethroscopy was performed.  The patient's urethra was examined and was demonstrated mild bilobar prostatic hypertrophy.  The bladder was then systematically examined in its entirety revealing the baove findings.   The bladder was then re-examined after the  resectoscope was placed.  The bladder tumor was approximately 6cm in area.  It was located along the right bladder neck, right lateral trigone just lateral to the right ureteral orifice, and along posterior right bladder wall, and anterior left bladder neck and appeared papillary. Using loop cautery resection, the entire tumor was resected and removed for permanent pathologic analysis.   Separate biopsies were also taken of the underlying deep bladder tissue and sent as a separate specimen.   Hemostasis was then achieved with the loop cautery and the bladder was emptied and reinspected with no further bleeding noted at the end of the procedure. The ureteral orifices were spared. Erythematous areas were fulgurated on coag settting.    Bladder left full and 20Fr 2 way foley was placed with 10cc sterile water in the balloon. The patient appeared to tolerate the procedure well and without complications.  The patient was able to be awakened and transferred to the recovery unit in satisfactory condition.   I participated in all aspects of the above case agree with the above operative note.

## 2020-11-09 ENCOUNTER — Encounter (HOSPITAL_COMMUNITY): Payer: Self-pay | Admitting: Urology

## 2020-11-09 DIAGNOSIS — J439 Emphysema, unspecified: Secondary | ICD-10-CM | POA: Diagnosis not present

## 2020-11-09 DIAGNOSIS — E119 Type 2 diabetes mellitus without complications: Secondary | ICD-10-CM | POA: Diagnosis not present

## 2020-11-09 DIAGNOSIS — C673 Malignant neoplasm of anterior wall of bladder: Secondary | ICD-10-CM | POA: Diagnosis not present

## 2020-11-09 DIAGNOSIS — J9611 Chronic respiratory failure with hypoxia: Secondary | ICD-10-CM | POA: Diagnosis not present

## 2020-11-09 DIAGNOSIS — C678 Malignant neoplasm of overlapping sites of bladder: Secondary | ICD-10-CM | POA: Diagnosis not present

## 2020-11-09 LAB — CBC
HCT: 33.4 % — ABNORMAL LOW (ref 39.0–52.0)
Hemoglobin: 10.6 g/dL — ABNORMAL LOW (ref 13.0–17.0)
MCH: 31.8 pg (ref 26.0–34.0)
MCHC: 31.7 g/dL (ref 30.0–36.0)
MCV: 100.3 fL — ABNORMAL HIGH (ref 80.0–100.0)
Platelets: 130 10*3/uL — ABNORMAL LOW (ref 150–400)
RBC: 3.33 MIL/uL — ABNORMAL LOW (ref 4.22–5.81)
RDW: 14.6 % (ref 11.5–15.5)
WBC: 6.9 10*3/uL (ref 4.0–10.5)
nRBC: 0 % (ref 0.0–0.2)

## 2020-11-09 LAB — BASIC METABOLIC PANEL
Anion gap: 6 (ref 5–15)
BUN: 29 mg/dL — ABNORMAL HIGH (ref 8–23)
CO2: 26 mmol/L (ref 22–32)
Calcium: 8.6 mg/dL — ABNORMAL LOW (ref 8.9–10.3)
Chloride: 101 mmol/L (ref 98–111)
Creatinine, Ser: 1.84 mg/dL — ABNORMAL HIGH (ref 0.61–1.24)
GFR, Estimated: 36 mL/min — ABNORMAL LOW (ref >60)
Glucose, Bld: 105 mg/dL — ABNORMAL HIGH (ref 70–99)
Potassium: 4.4 mmol/L (ref 3.5–5.1)
Sodium: 133 mmol/L — ABNORMAL LOW (ref 135–145)

## 2020-11-09 LAB — GLUCOSE, CAPILLARY
Glucose-Capillary: 98 mg/dL (ref 70–99)
Glucose-Capillary: 129 mg/dL — ABNORMAL HIGH (ref 70–99)
Glucose-Capillary: 90 mg/dL (ref 70–99)

## 2020-11-09 LAB — SURGICAL PATHOLOGY

## 2020-11-09 MED ORDER — TRAMADOL HCL 50 MG PO TABS: 50.0000 mg | ORAL_TABLET | Freq: Four times a day (QID) | ORAL | 0 refills | Status: AC | PRN

## 2020-11-09 MED ORDER — CLONIDINE HCL 0.1 MG PO TABS: 0.1000 mg | ORAL_TABLET | Freq: Two times a day (BID) | ORAL | 1 refills | Status: DC

## 2020-11-09 MED ORDER — SORBITOL 70 % SOLN
30.0000 mL | Freq: Once | Status: AC
  Administered 2020-11-09: 30 mL via ORAL
  Filled 2020-11-09: qty 30

## 2020-11-09 MED ORDER — GUAIFENESIN-DM 100-10 MG/5ML PO SYRP
5.0000 mL | ORAL_SOLUTION | ORAL | Status: DC | PRN
  Administered 2020-11-09: 5 mL via ORAL
  Filled 2020-11-09: qty 10

## 2020-11-09 MED ORDER — CHLORHEXIDINE GLUCONATE CLOTH 2 % EX PADS: 6.0000 | MEDICATED_PAD | Freq: Every day | CUTANEOUS | Status: DC

## 2020-11-09 NOTE — Progress Notes (Signed)
Pt to be discharged to home this afternoon. Pt and Pt's Daughter given discharge teaching/instructions. Teaching included foley and leg bag care for home. All discharge Medications and schedules for these Medications reviewed with the Pt and Pt's Daughter. Understanding verbalized of all discharge teaching. Discharge AVS with Pt at time discharge.

## 2020-11-09 NOTE — Progress Notes (Signed)
Dr. Diona Fanti updated on Pt's voiding trail post foley catheter removal early am. Pt has voided but output small and Pt with c/o feeling like bladder is not empty. Pt educated regarding discussion with the MD   Per MD orders 18 French catheter inserted with a return of 250 cc bloody urine and a few small blood clots were noted. Pt  tolerated procedure fair with discomfort during insertion.

## 2020-11-09 NOTE — Discharge Instructions (Signed)

## 2020-11-09 NOTE — Final Progress Note (Signed)
1 Day Post-Op Subjective: Patient reports feeling fine--stable overnight. No bladder pain.  Objective: Vital signs in last 24 hours: Temp:  [97.6 F (36.4 C)-98.2 F (36.8 C)] 98.2 F (36.8 C) (08/05 0454) Pulse Rate:  [52-83] 60 (08/05 0535) Resp:  [10-22] 17 (08/05 0454) BP: (90-158)/(49-78) 120/56 (08/05 0535) SpO2:  [91 %-99 %] 96 % (08/05 0454)  Intake/Output from previous day: 08/04 0701 - 08/05 0700 In: 490 [P.O.:240; I.V.:250] Out: 1105 [Urine:1095; Blood:10] Intake/Output this shift: No intake/output data recorded.  Physical Exam:  Constitutional: Vital signs reviewed. WD WN in NAD   Eyes: PERRL, No scleral icterus.   Cardiovascular: RRR Pulmonary/Chest: Normal effort for pt  Urine clear pink--no clots   Lab Results: Recent Labs    11/07/20 1803 11/09/20 0452  HGB 12.3* 10.6*  HCT 39.7 33.4*   BMET Recent Labs    11/08/20 0506 11/09/20 0452  NA 139 133*  K 4.5 4.4  CL 105 101  CO2 27 26  GLUCOSE 99 105*  BUN 30* 29*  CREATININE 1.70* 1.84*  CALCIUM 8.9 8.6*   No results for input(s): LABPT, INR in the last 72 hours. No results for input(s): LABURIN in the last 72 hours. Results for orders placed or performed during the hospital encounter of 11/07/20  SARS Coronavirus 2 by RT PCR (hospital order, performed in [facility] hospital lab) Nasopharyngeal Nasopharyngeal Swab     Status: None   Collection Time: 11/07/20  5:53 PM   Specimen: Nasopharyngeal Swab  Result Value Ref Range Status   SARS Coronavirus 2 NEGATIVE NEGATIVE Final    Comment: (NOTE) SARS-CoV-2 target nucleic acids are NOT DETECTED.  The SARS-CoV-2 RNA is generally detectable in upper and lower respiratory specimens during the acute phase of infection. The lowest concentration of SARS-CoV-2 viral copies this assay can detect is 250 copies / mL. A negative result does not preclude SARS-CoV-2 infection and should not be used as the sole basis for treatment or other patient  management decisions.  A negative result may occur with improper specimen collection / handling, submission of specimen other than nasopharyngeal swab, presence of viral mutation(s) within the areas targeted by this assay, and inadequate number of viral copies (<250 copies / mL). A negative result must be combined with clinical observations, patient history, and epidemiological information.  Fact Sheet for Patients:   StrictlyIdeas.no  Fact Sheet for Healthcare Providers: BankingDealers.co.za  This test is not yet approved or  cleared by the [place] FDA and has been authorized for detection and/or diagnosis of SARS-CoV-2 by FDA under an Emergency Use Authorization (EUA).  This EUA will remain in effect (meaning this test can be used) for the duration of the COVID-19 declaration under Section 564(b)(1) of the Act, 21 U.S.C. section 360bbb-3(b)(1), unless the authorization is terminated or revoked sooner.  Performed at [facility], [place] [address], [place], [place] [address]     Studies/Results: No results found.  Assessment/Plan:  POD 1 repeat TURBT  doing well. I will d/c foley--OK to go home later once he has voided and once signed off by hospitalists   LOS: 0 days   Jorja Loa 11/09/2020, 7:36 AM

## 2020-11-09 NOTE — Discharge Summary (Addendum)
[facility] Discharge Summary  Admit date: 11/07/2020  Discharge date and time: 11/09/20   Discharge to: Home  Discharge Service: Urology  Discharge Attending Physician:  Franchot Gallo, MD  Discharge  Diagnoses: Malignant neoplasm of overlapping sites of bladder [facility])  Secondary Diagnosis: Principal Problem:   Malignant neoplasm of overlapping sites of bladder [facility]) Active Problems:   Diabetes mellitus type 2 in nonobese [facility])   Hyperlipidemia   Essential hypertension   COPD with emphysema, on O2   Chronic respiratory failure with hypoxia ([place])   OR Procedures: Procedure(s): TRANSURETHRAL RESECTION OF BLADDER TUMOR 11/08/2020   Ancillary Procedures: None   Discharge Day Services: The patient was seen and examined by the Urology team both in the morning and immediately prior to discharge.  Vital signs and laboratory values were stable and within normal limits.  The physical exam was benign and unchanged and all surgical wounds were examined.  Discharge instructions were explained and all questions answered.  Subjective  No acute events overnight. Pain Controlled. No fever or chills.  Objective Patient Vitals for the past 8 hrs:  BP Temp Temp src Pulse Resp SpO2  11/09/20 1120 (!) 146/56 (!) 97.5 F (36.4 C) -- 71 -- (!) 81 %  11/09/20 1028 (!) 122/58 -- -- -- -- --  11/09/20 0843 -- -- -- -- -- 96 %  11/09/20 0535 (!) 120/56 -- -- 60 -- --  11/09/20 0454 (!) 127/49 98.2 F (36.8 C) Oral (!) 52 17 96 %   Total I/O In: 240 [P.O.:240] Out: 400 [Urine:400]  General Appearance:        No acute distress Lungs:                       Normal work of breathing on 2L [place] Heart:                                Regular rate and rhythm Abdomen:                         Soft, non-tender, non-distended. Voiding spontaneously Extremities:                      Warm and well perfused   Hospital Course:  Pre-admitted due to his comobridities including pulmonary fibrosis and  COPD on 2L [place] at home. The patient underwent TURBT under spinal anesthesia on 11/08/2020.  The patient tolerated the procedure well, was extubated in the OR, and afterwards was taken to the PACU for routine post-surgical care. When stable the patient was transferred to the floor.   The patient did well postoperatively.  Void trial on POD1 was unsuccessful so a foley catheter was reinserted. The patient's diet was slowly advanced and at the time of discharge was tolerating a regular diet.  The patient was discharged home 1 Day Post-Op, at which point was tolerating a regular solid diet, have adequate pain control with P.O. pain medication, and could ambulate without difficulty. The patient will follow up with [place] for post op check and foley catheter removal.   Condition at Discharge: Improved  Discharge Medications:  Allergies as of 11/09/2020   No Known Allergies      Medication List     TAKE these medications    albuterol 108 (90 Base) MCG/ACT inhaler Commonly known as: VENTOLIN HFA Inhale 2 puffs into the lungs  every 4 (four) hours as needed for wheezing or shortness of breath.   amLODipine 10 MG tablet Commonly known as: NORVASC Take 1 tablet (10 mg total) by mouth daily.   carvedilol 3.125 MG tablet Commonly known as: COREG Take 1 tablet (3.125 mg total) by mouth 2 (two) times daily with a meal.   cholecalciferol 25 MCG (1000 UNIT) tablet Commonly known as: VITAMIN D3 Take 1,000 Units by mouth daily.   cloNIDine 0.1 MG tablet Commonly known as: CATAPRES Take 1 tablet (0.1 mg total) by mouth 2 (two) times daily.   doxazosin 4 MG tablet Commonly known as: CARDURA Take 1 tablet (4 mg total) by mouth at bedtime.   ferrous fumarate 325 (106 Fe) MG Tabs tablet Commonly known as: HEMOCYTE - 106 mg FE Take 1 tablet (106 mg of iron total) by mouth 2 (two) times daily.   furosemide 20 MG tablet Commonly known as: LASIX Take 20 mg by mouth daily.   ipratropium-albuterol 0.5-2.5 (3)  MG/3ML Soln Commonly known as: DUONEB Take 3 mLs by nebulization in the morning and at bedtime.   MAGNESIUM PO Take 1 tablet by mouth daily.   multivitamin tablet Take 1 tablet by mouth daily.   OXYGEN Inhale 3 L into the lungs continuous.   Pfizer-BioNT COVID-19 Vac-TriS Susp injection Generic drug: COVID-19 mRNA Vac-TriS (Pfizer) Inject into the muscle.   pioglitazone 45 MG tablet Commonly known as: ACTOS Take 45 mg by mouth daily.   polyethylene glycol 17 g packet Commonly known as: MIRALAX / GLYCOLAX Take 17 g by mouth daily.   predniSONE 20 MG tablet Commonly known as: DELTASONE Take 20 mg by mouth daily as needed (gout).   sodium chloride 0.65 % Soln nasal spray Commonly known as: OCEAN Place 1 spray into both nostrils as needed for congestion.   traMADol 50 MG tablet Commonly known as: Ultram Take 1 tablet (50 mg total) by mouth every 6 (six) hours as needed for up to 5 days for moderate pain.       ASK your doctor about these medications    atorvastatin 80 MG tablet Commonly known as: LIPITOR Take 1 tablet (80 mg total) by mouth daily.   colchicine 0.6 MG tablet Take 1 tablet (0.6 mg total) by mouth 2 (two) times daily as needed.   ketoconazole 2 % cream Commonly known as: NIZORAL Apply 1 application topically daily.   mometasone-formoterol 100-5 MCG/ACT Aero Commonly known as: DULERA Inhale 2 puffs into the lungs 2 (two) times daily.   senna 8.6 MG Tabs tablet Commonly known as: SENOKOT Take 1 tablet (8.6 mg total) by mouth 2 (two) times daily.   saline Gel Place 1 application into both nostrils every 12 (twelve) hours.

## 2020-11-09 NOTE — Plan of Care (Signed)

## 2020-11-09 NOTE — Progress Notes (Signed)
PROGRESS NOTE/consult follow-up    Bradley Johns  [id number] DOB: 08-28-1934 DOA: 11/07/2020 PCP: Colon Branch, MD    No chief complaint on file.   Brief Narrative:  Patient 86 year old gentleman history of COPD/chronic respiratory failure with hypoxia on 3 to 5 L O2 via nasal cannula, CKD stage IIIb, type 2 diabetes mellitus, hypertension, hyperlipidemia, chronic diastolic CHF, anemia of chronic disease/iron deficiency anemia, OSA, high-grade invasive bladder cancer underwent prior TURBT 08/02/2020 and admitted under urology service for repeat TURBT on 11/08/2020, hospitalist consulted for medical management while in-house.   Assessment & Plan:   Principal Problem:   Malignant neoplasm of overlapping sites of bladder [facility]) Active Problems:   Diabetes mellitus type 2 in nonobese [facility])   Hyperlipidemia   Essential hypertension   COPD with emphysema, on O2   Chronic respiratory failure with hypoxia (HCC)  #1 high-grade nonmuscle invasive bladder cancer -Patient underwent TURBT on 11/08/2020. -Per primary team.  2.  Pulmonary fibrosis with chronic respiratory failure with hypoxia/COPD -Currently stable on 3 L home O2. -Patient noted to require up to 5 L O2 on exertion. -Continue current scheduled duo nebs, Dulera, Claritin, PPI. -Supportive care..  3.  Hypertension -Controlled on current regimen of Norvasc, Coreg, clonidine, Cardura, Lasix.   -Outpatient follow-up with PCP.   4.  CKD stage IIIb -Stable. -Follow.  5.  Chronic diastolic CHF -Currently euvolemic/compensated. -Continue cardiac regimen of Coreg, Lasix, Norvasc.  6.  Diabetes mellitus type 2 -Hemoglobin A1c 6.8 (08/07/2020) -CBG 98 this morning.   -Continue SSI.   -Continue to hold Actos and resume on discharge.   -Outpatient follow-up with PCP.  7.  Anemia of chronic disease/iron deficiency anemia -Continue oral iron supplement. -Follow H&H.  8.  Hyperlipidemia -Continue statin.  9.  OSA -It is noted  that patient not using CPAP at home uses supplemental O2.  10.  Constipation -Continue MiraLAX twice daily, Senokot-S twice daily. -Sorbitol p.o. x1.    DVT prophylaxis: Per primary team. Code Status: Full Family Communication: Updated patient. Disposition:   Status is: Observation  The patient remains OBS appropriate and will d/c before 2 midnights.  Dispo: The patient is from: Home              Anticipated d/c is to: Home              Patient currently is medically stable from a hospitalist standpoint for discharge if okay with urology.   Difficult to place patient No       Consultants:  [facility] hospitalist: Dr. Posey Pronto 11/07/2020  Procedures:  Cystoscopy/transurethral resection of bladder tumor per Dr. Diona Fanti 11/08/2020  Antimicrobials:  IV Ancef preop 11/08/2020   Subjective: Sitting up at the side of the bed.  Denies any chest pain.  No shortness of breath.  Complain of constipation and awaiting to have good urine output.  Hopeful to go home today.   Objective: Vitals:   11/09/20 0535 11/09/20 0843 11/09/20 1028 11/09/20 1120  BP: (!) 120/56  (!) 122/58 (!) 146/56  Pulse: 60   71  Resp:      Temp:    (!) 97.5 F (36.4 C)  TempSrc:      SpO2:  96%  (!) 81%  Weight:      Height:        Intake/Output Summary (Last 24 hours) at 11/09/2020 1136 Last data filed at 11/09/2020 0959 Gross per 24 hour  Intake 1280 ml  Output 1495 ml  Net -215 ml  Filed Weights   11/07/20 1841  Weight: 79.6 kg    Examination:  General exam: NAD Respiratory system: Bibasilar fine crackles.  No wheezes, no rhonchi, fair air movement.  Speaking in full sentences. Cardiovascular system: Regular rate and rhythm no murmurs rubs or gallops.  No JVD.  No lower extremity edema. Gastrointestinal system: Abdomen is soft, nondistended, nontender to palpation.  Positive bowel sounds.  No rebound.  No guarding. Central nervous system: Alert and oriented.  Moving extremities spontaneously.   No focal neurological deficits.  Extremities: Symmetric 5 x 5 power. Skin: No rashes, lesions or ulcers Psychiatry: Judgement and insight appear normal. Mood & affect appropriate.     Data Reviewed: I have personally reviewed following labs and imaging studies  CBC: Recent Labs  Lab 11/07/20 1803 11/09/20 0452  WBC  --  6.9  HGB 12.3* 10.6*  HCT 39.7 33.4*  MCV  --  100.3*  PLT  --  130*     Basic Metabolic Panel: Recent Labs  Lab 11/07/20 1803 11/08/20 0506 11/09/20 0452  NA 140 139 133*  K 4.8 4.5 4.4  CL 102 105 101  CO2 '29 27 26  '$ GLUCOSE 113* 99 105*  BUN 32* 30* 29*  CREATININE 1.91* 1.70* 1.84*  CALCIUM 9.3 8.9 8.6*     GFR: Estimated Creatinine Clearance: 28.9 mL/min (A) (by C-G formula based on SCr of 1.84 mg/dL (H)).  Liver Function Tests: No results for input(s): AST, ALT, ALKPHOS, BILITOT, PROT, ALBUMIN in the last 168 hours.  CBG: Recent Labs  Lab 11/08/20 1413 11/08/20 1631 11/08/20 1938 11/09/20 0719 11/09/20 1117  GLUCAP 143* 131* 147* 98 129*      Recent Results (from the past 240 hour(s))  SARS Coronavirus 2 by RT PCR (hospital order, performed in [facility] hospital lab) Nasopharyngeal Nasopharyngeal Swab     Status: None   Collection Time: 11/07/20  5:53 PM   Specimen: Nasopharyngeal Swab  Result Value Ref Range Status   SARS Coronavirus 2 NEGATIVE NEGATIVE Final    Comment: (NOTE) SARS-CoV-2 target nucleic acids are NOT DETECTED.  The SARS-CoV-2 RNA is generally detectable in upper and lower respiratory specimens during the acute phase of infection. The lowest concentration of SARS-CoV-2 viral copies this assay can detect is 250 copies / mL. A negative result does not preclude SARS-CoV-2 infection and should not be used as the sole basis for treatment or other patient management decisions.  A negative result may occur with improper specimen collection / handling, submission of specimen other than nasopharyngeal swab,  presence of viral mutation(s) within the areas targeted by this assay, and inadequate number of viral copies (<250 copies / mL). A negative result must be combined with clinical observations, patient history, and epidemiological information.  Fact Sheet for Patients:   StrictlyIdeas.no  Fact Sheet for Healthcare Providers: BankingDealers.co.za  This test is not yet approved or  cleared by the [place] FDA and has been authorized for detection and/or diagnosis of SARS-CoV-2 by FDA under an Emergency Use Authorization (EUA).  This EUA will remain in effect (meaning this test can be used) for the duration of the COVID-19 declaration under Section 564(b)(1) of the Act, 21 U.S.C. section 360bbb-3(b)(1), unless the authorization is terminated or revoked sooner.  Performed at [facility], [place] [address] [place], [place] [address]           Radiology Studies: No results found.      Scheduled Meds:  acetaminophen  1,000 mg  Oral Q6H   amLODipine  10 mg Oral Daily   atorvastatin  80 mg Oral Daily   carvedilol  3.125 mg Oral BID WC   cloNIDine  0.1 mg Oral BID   doxazosin  4 mg Oral QHS   Ferrous Fumarate  1 tablet Oral BID   fluticasone  2 spray Each Nare Daily   furosemide  20 mg Oral Daily   insulin aspart  0-9 Units Subcutaneous TID WC   ipratropium-albuterol  3 mL Nebulization BID   loratadine  10 mg Oral Daily   mouth rinse  15 mL Mouth Rinse BID   mometasone-formoterol  2 puff Inhalation BID   pantoprazole  40 mg Oral Q0600   polyethylene glycol  17 g Oral Daily   senna  1 tablet Oral BID   sorbitol  30 mL Oral Once   Continuous Infusions:     LOS: 0 days    Time spent: 35 minutes    Irine Seal, MD [facility]   To contact the attending provider between 7A-7P or the covering provider during after hours 7P-7A, please log into the web site [email or web address] and access using  universal [place] password for that web site. If you do not have the password, please call the hospital operator.  11/09/2020, 11:36 AM

## 2020-11-12 ENCOUNTER — Telehealth: Payer: Self-pay

## 2020-11-12 NOTE — Telephone Encounter (Signed)
Transition Care Management Follow-up Telephone Call Date of discharge and from where: 11/09/2020-[place] How have you been since you were released from the hospital? Doing ok but annoyed by his catheter per daughter Any questions or concerns? No  Items Reviewed: Did the pt receive and understand the discharge instructions provided? Yes  Medications obtained and verified? Yes  Other? Yes  Any new allergies since your discharge? No  Dietary orders reviewed? Yes Do you have support at home? Yes   Home Care and Equipment/Supplies: Were home health services ordered? no If so, what is the name of the agency? N/a  Has the agency set up a time to come to the patient's home? not applicable Were any new equipment or medical supplies ordered?  No What is the name of the medical supply agency? N/a Were you able to get the supplies/equipment? not applicable Do you have any questions related to the use of the equipment or supplies? N/a  Functional Questionnaire: (I = Independent and D = Dependent) ADLs: I  Bathing/Dressing- I  Meal Prep- I  Eating- I  Maintaining continence- I-CATHETER  Transferring/Ambulation- I  Managing Meds- I  Follow up appointments reviewed:  PCP Hospital f/u appt confirmed? Yes  Scheduled to see Dr. Larose Kells on 11/15/2020 @ 11:20. [place] [facility] f/u appt confirmed? Yes  Scheduled to see Dr.Dahlstedt on 11/28/2020 @ 2:45. Are transportation arrangements needed? No  If their condition worsens, is the pt aware to call PCP or go to the Emergency Dept.? Yes Was the patient provided with contact information for the PCP's office or ED? Yes Was to pt encouraged to call back with questions or concerns? Yes

## 2020-11-15 ENCOUNTER — Encounter: Payer: Self-pay | Admitting: Internal Medicine

## 2020-11-15 ENCOUNTER — Other Ambulatory Visit: Payer: Self-pay

## 2020-11-15 ENCOUNTER — Ambulatory Visit (INDEPENDENT_AMBULATORY_CARE_PROVIDER_SITE_OTHER): Payer: Medicare HMO | Admitting: Internal Medicine

## 2020-11-15 VITALS — BP 114/72 | HR 63 | Temp 97.8°F | Resp 20 | Ht 68.0 in | Wt 179.5 lb

## 2020-11-15 DIAGNOSIS — I10 Essential (primary) hypertension: Secondary | ICD-10-CM | POA: Diagnosis not present

## 2020-11-15 DIAGNOSIS — E119 Type 2 diabetes mellitus without complications: Secondary | ICD-10-CM

## 2020-11-15 NOTE — Progress Notes (Signed)
Subjective:    Patient ID: Bradley Johns, male    DOB: 08-Jan-1935, 86 y.o.   MRN: [id number]  DOS:  11/15/2020 Type of visit - description: Hospital follow-up, here with his daughter [name]  Admitted to the hospital and discharged 11/09/2020. He was admitted for resection of a bladder tumor, postop he was stable.  Since he left the hospital he is doing okay, Foley catheter was removed 2 days ago. Since then had supra pubic pain and some dysuria but that is getting better. Ambulatory BPs very good. Denies any fever chills  Review of Systems See above   Past Medical History:  Diagnosis Date   Anemia    iron   Arthritis    Benign prostatic hypertrophy    COPD (chronic obstructive pulmonary disease) ([place])    24/7 O2   Diabetes mellitus    per Dr Posey Pronto   Epidermal cyst of face    left lower lateral cheek   Gout    History of colon polyps    History of kidney stones 1980s   Hyperlipidemia    Hypertension    Kidney disease    Pulmonary fibrosis ([place]) 2014   Sleep apnea    Urothelial carcinoma ([place]) 05/24/2020    Past Surgical History:  Procedure Laterality Date   COLONOSCOPY WITH PROPOFOL N/A 05/18/2018   Procedure: COLONOSCOPY WITH PROPOFOL;  Surgeon: Carol Ada, MD;  Location: [facility];  Service: Endoscopy;  Laterality: N/A;   POLYPECTOMY  05/18/2018   Procedure: POLYPECTOMY;  Surgeon: Carol Ada, MD;  Location: [facility];  Service: Endoscopy;;   TOE SURGERY     TRANSURETHRAL RESECTION OF BLADDER TUMOR WITH MITOMYCIN-C N/A 08/02/2020   Procedure: TRANSURETHRAL RESECTION OF BLADDER TUMOR;  Surgeon: Franchot Gallo, MD;  Location: [facility];  Service: Urology;  Laterality: N/A;  1 HR   TRANSURETHRAL RESECTION OF BLADDER TUMOR WITH MITOMYCIN-C N/A 11/08/2020   Procedure: TRANSURETHRAL RESECTION OF BLADDER TUMOR;  Surgeon: Franchot Gallo, MD;  Location: [facility];  Service: Urology;  Laterality: N/A;    Allergies as of 11/15/2020   No Known Allergies       Medication List        Accurate as of November 15, 2020 11:59 PM. If you have any questions, ask your nurse or doctor.          STOP taking these medications    Pfizer-BioNT COVID-19 Vac-TriS Susp injection Generic drug: COVID-19 mRNA Vac-TriS Therapist, music) Stopped by: Kathlene November, MD       TAKE these medications    albuterol 108 (90 Base) MCG/ACT inhaler Commonly known as: VENTOLIN HFA Inhale 2 puffs into the lungs every 4 (four) hours as needed for wheezing or shortness of breath.   amLODipine 10 MG tablet Commonly known as: NORVASC Take 1 tablet (10 mg total) by mouth daily.   atorvastatin 80 MG tablet Commonly known as: LIPITOR Take 1 tablet (80 mg total) by mouth daily. What changed: when to take this   carvedilol 3.125 MG tablet Commonly known as: COREG Take 1 tablet (3.125 mg total) by mouth 2 (two) times daily with a meal.   cholecalciferol 25 MCG (1000 UNIT) tablet Commonly known as: VITAMIN D3 Take 1,000 Units by mouth daily.   cloNIDine 0.1 MG tablet Commonly known as: CATAPRES Take 1 tablet (0.1 mg total) by mouth 2 (two) times daily.   colchicine 0.6 MG tablet Take 1 tablet (0.6 mg total) by mouth 2 (two) times daily as needed. What changed: reasons  to take this   doxazosin 4 MG tablet Commonly known as: CARDURA Take 1 tablet (4 mg total) by mouth at bedtime.   ferrous fumarate 325 (106 Fe) MG Tabs tablet Commonly known as: HEMOCYTE - 106 mg FE Take 1 tablet (106 mg of iron total) by mouth 2 (two) times daily.   furosemide 20 MG tablet Commonly known as: LASIX Take 20 mg by mouth daily.   HYDROcodone-acetaminophen 5-325 MG tablet Commonly known as: NORCO/VICODIN Take 1 tablet by mouth every 6 (six) hours as needed for moderate pain.   ipratropium-albuterol 0.5-2.5 (3) MG/3ML Soln Commonly known as: DUONEB Take 3 mLs by nebulization in the morning and at bedtime.   ketoconazole 2 % cream Commonly known as: NIZORAL Apply 1 application  topically daily. What changed:  when to take this reasons to take this   MAGNESIUM PO Take 1 tablet by mouth daily.   mometasone-formoterol 100-5 MCG/ACT Aero Commonly known as: DULERA Inhale 2 puffs into the lungs 2 (two) times daily.   multivitamin tablet Take 1 tablet by mouth daily.   OXYGEN Inhale 3 L into the lungs continuous.   pioglitazone 45 MG tablet Commonly known as: ACTOS Take 45 mg by mouth daily.   polyethylene glycol 17 g packet Commonly known as: MIRALAX / GLYCOLAX Take 17 g by mouth daily.   predniSONE 20 MG tablet Commonly known as: DELTASONE Take 20 mg by mouth daily as needed (gout).   saline Gel Place 1 application into both nostrils every 12 (twelve) hours.   sodium chloride 0.65 % Soln nasal spray Commonly known as: OCEAN Place 1 spray into both nostrils as needed for congestion.   senna 8.6 MG Tabs tablet Commonly known as: SENOKOT Take 1 tablet (8.6 mg total) by mouth 2 (two) times daily.           Objective:   Physical Exam BP 114/72 (BP Location: Left Arm, Patient Position: Sitting, Cuff Size: Small)   Pulse 63   Temp 97.8 F (36.6 C) (Oral)   Resp 20   Ht '5\' 8"'$  (1.727 m)   Wt 179 lb 8 oz (81.4 kg)   SpO2 90%   BMI 27.29 kg/m  General:   Well developed, NAD, BMI noted. HEENT:  Normocephalic . Face symmetric, atraumatic Lungs:  Decreased breath sounds, few dry crackles at bases? Normal respiratory effort, no intercostal retractions, no accessory muscle use. Heart: RRR,  no murmur.  Lower extremities: no pretibial edema bilaterally  Skin: Not pale. Not jaundice Neurologic:  alert & oriented X3.  Speech normal, gait not tested, sitting in wheelchair.  Psych--  Cognition and judgment appear intact.  Cooperative with normal attention span and concentration.  Behavior appropriate. No anxious or depressed appearing.      Assessment     Assessment  DM Dr. Posey Pronto  HTN Hyperlipidemia DJD-- rarely uses  hydrocodone Gout -- occ colchicine PULM: Chronic respiratory failure with hypoxia --COPD on oxygen  24/7   --OSA - no compliant w/ cpap --Pulmonary fibrosis GU: ---BPH    ---High-grade papillary urothelial carcinoma Dx 07/2020, s/p surgery   PLAN High-grade papillary urothelial carcinoma, s/p TURB, released from the hospital 11/09/2020, he seems to be doing well from that standpoint. HTN: Ambulatory BPs in the 114/70, rarely in the 140s.  Last BMP okay, continue with amlodipine, carvedilol, clonidine, Cardura, Lasix.  Continue monitoring. DM: Per Dr. Posey Pronto Patient education: The patient asked me to review all his medication and explain  what are they for, every  single prescribed medication reviewed with him and his daughter [name]. RTC 3 months  Time spent 24 minutes, I reviewed the discharge summary, I review every medication with the patient and his daughter at their request and explained what it was for. Multiple questions answered to the best of my ability This visit occurred during the SARS-CoV-2 public health emergency.  Safety protocols were in place, including screening questions prior to the visit, additional usage of staff PPE, and extensive cleaning of exam room while observing appropriate contact time as indicated for disinfecting solutions.

## 2020-11-15 NOTE — Patient Instructions (Signed)
Check the  blood pressure  BP GOAL is between 110/65 and  135/85. If it is consistently higher or lower, let me know     GO TO THE FRONT DESK, PLEASE SCHEDULE YOUR APPOINTMENTS Come back for a check up in 3 months

## 2020-11-16 NOTE — Assessment & Plan Note (Signed)
High-grade papillary urothelial carcinoma, s/p TURB, released from the hospital 11/09/2020, he seems to be doing well from that standpoint. HTN: Ambulatory BPs in the 114/70, rarely in the 140s.  Last BMP okay, continue with amlodipine, carvedilol, clonidine, Cardura, Lasix.  Continue monitoring. DM: Per Dr. Posey Pronto Patient education: The patient asked me to review all his medication and explain  what are they for, every single prescribed medication reviewed with him and his daughter Bradley Johns. RTC 3 months

## 2021-01-03 ENCOUNTER — Ambulatory Visit: Payer: Medicare HMO | Admitting: Internal Medicine

## 2021-02-14 ENCOUNTER — Ambulatory Visit: Payer: Medicare HMO | Admitting: Internal Medicine

## 2021-02-21 DIAGNOSIS — I10 Essential (primary) hypertension: Secondary | ICD-10-CM | POA: Diagnosis not present

## 2021-02-21 DIAGNOSIS — E7800 Pure hypercholesterolemia, unspecified: Secondary | ICD-10-CM | POA: Diagnosis not present

## 2021-02-21 DIAGNOSIS — Z7984 Long term (current) use of oral hypoglycemic drugs: Secondary | ICD-10-CM | POA: Diagnosis not present

## 2021-02-21 DIAGNOSIS — E119 Type 2 diabetes mellitus without complications: Secondary | ICD-10-CM | POA: Diagnosis not present

## 2021-02-21 LAB — HM DIABETES FOOT EXAM: HM Diabetic Foot Exam: NORMAL

## 2021-02-21 LAB — HEMOGLOBIN A1C: Hemoglobin A1C: 6

## 2021-03-07 ENCOUNTER — Ambulatory Visit: Payer: Medicare HMO | Attending: Internal Medicine

## 2021-03-07 ENCOUNTER — Encounter: Payer: Self-pay | Admitting: Internal Medicine

## 2021-03-07 ENCOUNTER — Ambulatory Visit (INDEPENDENT_AMBULATORY_CARE_PROVIDER_SITE_OTHER): Payer: Medicare HMO | Admitting: Internal Medicine

## 2021-03-07 VITALS — BP 126/74 | HR 61 | Temp 97.4°F | Resp 20 | Ht 68.0 in | Wt 179.0 lb

## 2021-03-07 DIAGNOSIS — Z23 Encounter for immunization: Secondary | ICD-10-CM

## 2021-03-07 DIAGNOSIS — I10 Essential (primary) hypertension: Secondary | ICD-10-CM

## 2021-03-07 DIAGNOSIS — J439 Emphysema, unspecified: Secondary | ICD-10-CM | POA: Diagnosis not present

## 2021-03-07 DIAGNOSIS — N1832 Chronic kidney disease, stage 3b: Secondary | ICD-10-CM

## 2021-03-07 DIAGNOSIS — E119 Type 2 diabetes mellitus without complications: Secondary | ICD-10-CM

## 2021-03-07 DIAGNOSIS — N183 Chronic kidney disease, stage 3 unspecified: Secondary | ICD-10-CM | POA: Insufficient documentation

## 2021-03-07 LAB — BASIC METABOLIC PANEL
BUN: 32 mg/dL — ABNORMAL HIGH (ref 6–23)
CO2: 31 mEq/L (ref 19–32)
Calcium: 9.1 mg/dL (ref 8.4–10.5)
Chloride: 100 mEq/L (ref 96–112)
Creatinine, Ser: 1.88 mg/dL — ABNORMAL HIGH (ref 0.40–1.50)
GFR: 32.42 mL/min — ABNORMAL LOW (ref >60.00)
Glucose, Bld: 86 mg/dL (ref 70–99)
Potassium: 4.6 mEq/L (ref 3.5–5.1)
Sodium: 136 mEq/L (ref 135–145)

## 2021-03-07 NOTE — Patient Instructions (Addendum)
Per our records you are due for your diabetic eye exam. Please contact your eye doctor to schedule an appointment. Please have them send copies of your office visit notes to [place]. Our fax number is [phone]. If you need a referral to an eye doctor please let us know.  Vaccines I  recommended: A COVID booster, you can make an appointment downstairs. At your convenience also get: - Tdap (tetanus shot) - The shingles shot. - A pneumonia shot call "PNM 20"  Check the  blood pressure regularly BP GOAL is between 110/65 and  135/85. If it is consistently higher or lower, let me know    GO TO THE LAB : Get the blood work     [place], [place] back for a physical exam in 4 to 5 months

## 2021-03-07 NOTE — Assessment & Plan Note (Signed)
Vaccines discussed. Tdap: Recommend to get at the pharmacy [facility] at his convenience PNM 20: We will discuss at the next opportunity Flu shot today COVID-vaccine booster recommended

## 2021-03-07 NOTE — Progress Notes (Signed)
   Covid-19 Vaccination Clinic  Name:  Bradley Johns    MRN: [id number] DOB: 1934-09-28  03/07/2021  Mr. Hasten was observed post Covid-19 immunization for 15 minutes without incident. He was provided with Vaccine Information Sheet and instruction to access the V-Safe system.   Mr. Hartland was instructed to call 911 with any severe reactions post vaccine: Difficulty breathing  Swelling of face and throat  A fast heartbeat  A bad rash all over body  Dizziness and weakness   Immunizations Administered     Name Date Dose VIS Date Route   Pfizer Covid-19 Vaccine Bivalent Booster 03/07/2021  2:25 PM 0.3 mL 12/05/2020 Intramuscular   Manufacturer: [place]   Lot: [id number]   [place]: [phone]

## 2021-03-07 NOTE — Progress Notes (Signed)
Subjective:    Patient ID: Bradley Johns, male    DOB: 1934/11/23, 86 y.o.   MRN: [id number]  DOS:  03/07/2021 Type of visit - description: f/u  Here with his daughter. He reports he is doing well. We went over all his medications.  Denies nausea vomiting Currently with no cough, chest congestion or wheezing.  Review of Systems See above   Past Medical History:  Diagnosis Date   Anemia    iron   Arthritis    Benign prostatic hypertrophy    COPD (chronic obstructive pulmonary disease) ([place])    24/7 O2   Diabetes mellitus    per Dr Posey Pronto   Epidermal cyst of face    left lower lateral cheek   Gout    History of colon polyps    History of kidney stones 1980s   Hyperlipidemia    Hypertension    Kidney disease    Pulmonary fibrosis ([place]) 2014   Sleep apnea    Urothelial carcinoma ([place]) 05/24/2020    Past Surgical History:  Procedure Laterality Date   COLONOSCOPY WITH PROPOFOL N/A 05/18/2018   Procedure: COLONOSCOPY WITH PROPOFOL;  Surgeon: Carol Ada, MD;  [facility];  Service: Endoscopy;  Laterality: N/A;   POLYPECTOMY  05/18/2018   Procedure: POLYPECTOMY;  Surgeon: Carol Ada, MD;  [facility];  Service: Endoscopy;;   TOE SURGERY     TRANSURETHRAL RESECTION OF BLADDER TUMOR WITH MITOMYCIN-C N/A 08/02/2020   Procedure: TRANSURETHRAL RESECTION OF BLADDER TUMOR;  Surgeon: Franchot Gallo, MD;  Location: [facility];  Service: Urology;  Laterality: N/A;  1 HR   TRANSURETHRAL RESECTION OF BLADDER TUMOR WITH MITOMYCIN-C N/A 11/08/2020   Procedure: TRANSURETHRAL RESECTION OF BLADDER TUMOR;  Surgeon: Franchot Gallo, MD;  [facility]: [facility];  Service: Urology;  Laterality: N/A;    Allergies as of 03/07/2021   No Known Allergies      Medication List        Accurate as of March 07, 2021  5:48 PM. If you have any questions, ask your nurse or doctor.          STOP taking these medications    predniSONE 20 MG tablet Commonly known as:  DELTASONE Stopped by: Kathlene November, MD       TAKE these medications    albuterol 108 (90 Base) MCG/ACT inhaler Commonly known as: VENTOLIN HFA Inhale 2 puffs into the lungs every 4 (four) hours as needed for wheezing or shortness of breath.   amLODipine 10 MG tablet Commonly known as: NORVASC Take 1 tablet (10 mg total) by mouth daily.   atorvastatin 80 MG tablet Commonly known as: LIPITOR Take 1 tablet (80 mg total) by mouth daily. What changed: when to take this   carvedilol 3.125 MG tablet Commonly known as: COREG Take 1 tablet (3.125 mg total) by mouth 2 (two) times daily with a meal.   cholecalciferol 25 MCG (1000 UNIT) tablet Commonly known as: VITAMIN D3 Take 1,000 Units by mouth daily.   cloNIDine 0.1 MG tablet Commonly known as: CATAPRES Take 1 tablet (0.1 mg total) by mouth 2 (two) times daily.   colchicine 0.6 MG tablet Take 1 tablet (0.6 mg total) by mouth 2 (two) times daily as needed. What changed: reasons to take this   doxazosin 4 MG tablet Commonly known as: CARDURA Take 1 tablet (4 mg total) by mouth at bedtime.   ferrous fumarate 325 (106 Fe) MG Tabs tablet Commonly known as: HEMOCYTE - 106  mg FE Take 1 tablet (106 mg of iron total) by mouth 2 (two) times daily.   furosemide 20 MG tablet Commonly known as: LASIX Take 20 mg by mouth daily.   HYDROcodone-acetaminophen 5-325 MG tablet Commonly known as: NORCO/VICODIN Take 1 tablet by mouth every 6 (six) hours as needed for moderate pain.   ipratropium-albuterol 0.5-2.5 (3) MG/3ML Soln Commonly known as: DUONEB Take 3 mLs by nebulization in the morning and at bedtime.   ketoconazole 2 % cream Commonly known as: NIZORAL Apply 1 application topically daily. What changed:  when to take this reasons to take this   losartan 50 MG tablet Commonly known as: COZAAR Take 1 tablet by mouth daily.   MAGNESIUM PO Take 1 tablet by mouth daily.   mometasone-formoterol 100-5 MCG/ACT Aero Commonly  known as: DULERA Inhale 2 puffs into the lungs 2 (two) times daily.   multivitamin tablet Take 1 tablet by mouth daily.   OXYGEN Inhale 3 L into the lungs continuous.   pioglitazone 45 MG tablet Commonly known as: ACTOS Take 45 mg by mouth daily.   polyethylene glycol 17 g packet Commonly known as: MIRALAX / GLYCOLAX Take 17 g by mouth daily.   saline Gel Place 1 application into both nostrils every 12 (twelve) hours.   sodium chloride 0.65 % Soln nasal spray Commonly known as: OCEAN Place 1 spray into both nostrils as needed for congestion.   senna 8.6 MG Tabs tablet Commonly known as: SENOKOT Take 1 tablet (8.6 mg total) by mouth 2 (two) times daily.           Objective:   Physical Exam BP 126/74 (BP Location: Left Arm, Patient Position: Sitting, Cuff Size: Small)   Pulse 61   Temp (!) 97.4 F (36.3 C) (Oral)   Resp 20   Ht 5\' 8"  (1.727 m)   Wt 179 lb (81.2 kg)   SpO2 93% Comment: 3L  BMI 27.22 kg/m  General:   Well developed, NAD, BMI noted.  Has a portable oxygen with him HEENT:  Normocephalic . Face symmetric, atraumatic Lungs: Fine crackles at bases Normal respiratory effort, no intercostal retractions, no accessory muscle use. Heart: RRR,  no murmur.  Lower extremities: no pretibial edema bilaterally  Skin: Not pale. Not jaundice Neurologic:  alert & oriented X3.  Speech normal, gait appropriate for age and assisted by cane. Psych--  Cognition and judgment appear intact.  Cooperative with normal attention span and concentration.  Behavior appropriate. No anxious or depressed appearing.      Assessment    Assessment  DM Dr. Posey Pronto  HTN Hyperlipidemia CKD: Sees nephrology DJD-- rarely uses hydrocodone Gout -- occ colchicine PULM: Chronic respiratory failure with hypoxia --COPD on oxygen  24/7   --OSA - no compliant w/ cpap --Pulmonary fibrosis GU: ---BPH    ---High-grade papillary urothelial carcinoma Dx 07/2020, s/p  surgery   PLAN DM: Per Endo last A1c 6.0 HTN: BP today is very good, a week ago he started losartan as prescribed by nephrology, he also takes amlodipine, carvedilol, clonidine, Cardura, Lasix.  We will check a BMP. CKD: See above.  Last hemoglobin a couple months ago was 13.4. COPD: O2 sats at home range from 89% to 94%.  On oxygen.  Uses inhalers and nebulizers as needed only. Vaccines discussed. Tdap: Recommend to get at the pharmacy Shingrix at his convenience PNM 20: We will discuss at the next opportunity Flu shot today COVID-vaccine booster recommended RTC 4 to 5 months CPX  This visit occurred during the SARS-CoV-2 public health emergency.  Safety protocols were in place, including screening questions prior to the visit, additional usage of staff PPE, and extensive cleaning of exam room while observing appropriate contact time as indicated for disinfecting solutions.

## 2021-03-07 NOTE — Assessment & Plan Note (Signed)
DM: Per Endo last A1c 6.0 HTN: BP today is very good, a week ago he started losartan as prescribed by nephrology, he also takes amlodipine, carvedilol, clonidine, Cardura, Lasix.  We will check a BMP. CKD: See above.  Last hemoglobin a couple months ago was 13.4. COPD: O2 sats at home range from 89% to 94%.  On oxygen.  Uses inhalers and nebulizers as needed only. Vaccines discussed. Tdap: Recommend to get at the pharmacy [facility] at his convenience PNM 20: We will discuss at the next opportunity Flu shot today COVID-vaccine booster recommended RTC 4 to 5 months CPX

## 2021-03-09 ENCOUNTER — Other Ambulatory Visit (HOSPITAL_BASED_OUTPATIENT_CLINIC_OR_DEPARTMENT_OTHER): Payer: Self-pay

## 2021-03-09 MED ORDER — PFIZER COVID-19 VAC BIVALENT 30 MCG/0.3ML IM SUSP
INTRAMUSCULAR | 0 refills | Status: DC
  Filled 2021-03-09: qty 0.3, 1d supply, fill #0

## 2021-03-11 ENCOUNTER — Other Ambulatory Visit (HOSPITAL_BASED_OUTPATIENT_CLINIC_OR_DEPARTMENT_OTHER): Payer: Self-pay

## 2021-03-14 ENCOUNTER — Encounter: Payer: Self-pay | Admitting: Pulmonary Disease

## 2021-03-14 ENCOUNTER — Other Ambulatory Visit: Payer: Self-pay

## 2021-03-14 ENCOUNTER — Ambulatory Visit (INDEPENDENT_AMBULATORY_CARE_PROVIDER_SITE_OTHER): Payer: Medicare HMO | Admitting: Pulmonary Disease

## 2021-03-14 VITALS — BP 136/60 | HR 71 | Temp 97.5°F | Ht 68.0 in | Wt 182.8 lb

## 2021-03-14 DIAGNOSIS — R0602 Shortness of breath: Secondary | ICD-10-CM | POA: Diagnosis not present

## 2021-03-14 DIAGNOSIS — J9611 Chronic respiratory failure with hypoxia: Secondary | ICD-10-CM | POA: Diagnosis not present

## 2021-03-14 NOTE — Progress Notes (Signed)
@Patient  ID: Bradley Johns, male    DOB: Nov 19, 1934, 86 y.o.   MRN: [id number]  Chief Complaint  Patient presents with   Follow-up    Pt states that breathing is more labored and SOB when he is walking he is currently on 3L on oxygen when he is walking     Referring provider: Colon Branch, MD  HPI:   86 y.o. with COPD and possible pulmonary fibrosis whom we are seeing in follow up after recent hospitalization. Op note from [facility]. Discharge summary 8/22 reviewed.   Doing okay.  Tolerated surgery well.  Still dyspneic.  Walked around clinic today.  Required 4 L pulsed on POC.  Discussed will be sent in a new order.  HPI at initial visit: Patient states he is doing okay.  He has baseline dyspnea on exertion.  He thinks is gradual worse over time but not much change over the last several months.  Certainly worse over the last couple months although he thinks he slowly getting stamina back.  He was hospitalized on Christmas Day 2021.  He had a fall at home.  Hemoglobin was low.  He received blood.  VQ scan was negative for PE.  He got much worse couple days later and was given Lasix and Solu-Medrol for worsening hypoxemia.  He has brief with described as bradycardic PEA arrest and received CPR and chemical resuscitation.  He was not intubated.  Unclear if he truly lost a pulse.  He was monitored in the ICU after this.  He suddenly improved and was discharged home.  Had a rehab stent.  He is now back at home.  Again dyspnea worse than prior.  Not much worse overall from several months ago.  He thinks he slowly get the strength back from hospitalization.  He is using oxygen as prescribed.  Using around 4 L mostly.  He is on 3 L today.  No recent upper respiratory infections in the last month.  Hemoglobin most recently last month was greater than 10.  His oxygen saturation today is 95% on supplemental O2.  Reviewed chest images most recent chest x-ray 12/28 2021 which shows interstitial changes  worsened from admission concerning for pulmonary edema on my interpretation.  Most recent cross-sectional imaging 06/09/2018 reviewed which shows significant emphysematous changes in the upper lobes, paraseptal and peripheral cystic changes throughout consistent with emphysema versus pulmonary fibrosis on my interpretation.  Most recent TTE 03/2020 reviewed with normal EF, reportedly normal diastolic pressures, dilated LA, MVR, mild AS, elevated RA pressures, RA size normal, RV size and function normal, moderately elevated PASP.  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  MMRC: mMRC Dyspnea Scale mMRC Score  06/14/2020 3    Epworth:  No flowsheet data found.  Tests:   FENO:  No results found for: NITRICOXIDE  PFT: PFT Results Latest Ref Rng & Units 04/21/2014  FVC-Pre L 1.99  FVC-Predicted Pre % 59  FVC-Post L 2.22  FVC-Predicted Post % 66  Pre FEV1/FVC % % 74  Post FEV1/FCV % % 74  FEV1-Pre L 1.47  FEV1-Predicted  Pre % 60  FEV1-Post L 1.65  DLCO uncorrected ml/min/mmHg 8.10  DLCO UNC% % 27  DLVA Predicted % 51  TLC L 4.21  TLC % Predicted % 64  RV % Predicted % 81  PFTs reviewed on my interpretation revealed moderate restriction with reduced DLCO, normal FEV1/FVC ratio, FEV1 and FVC both reduced, DLCO severely reduced  WALK:  SIX MIN WALK 12/28/2012 12/28/2012 12/14/2012 12/14/2012  Supplimental Oxygen during Test? (L/min) Yes Yes Yes No  O2 Flow Rate 3 2 3  -  Type Continuous Continuous Continuous -  Tech Comments: - Pt was  placed on 3 liters O2 cont flowand then recovered 91%  Pt desaturated 1 st lap 83% 2 liters. Pt was placed on 3 liters cont flow and O2 sats was 91%. pt did 2 laps with 3 liters cont flow and sats maintained 95% -    Imaging: Personally reviewed and as per EMR discussion in this note  Lab Results: Personally reviewed, anemia stable, most recent hemoglobin 10.5, no elevated eosinophils CBC    Component Value Date/Time   WBC 6.9 11/09/2020 0452   RBC 3.33 (L) 11/09/2020 0452   HGB 10.6 (L) 11/09/2020 0452   HCT 33.4 (L) 11/09/2020 0452   PLT 130 (L) 11/09/2020 0452   MCV 100.3 (H) 11/09/2020 0452   MCH 31.8 11/09/2020 0452   MCHC 31.7 11/09/2020 0452   RDW 14.6 11/09/2020 0452   LYMPHSABS 0.8 08/30/2020 1343   MONOABS 0.5 08/30/2020 1343   EOSABS 0.3 08/30/2020 1343   BASOSABS 0.1 08/30/2020 1343    BMET    Component Value Date/Time   NA 136 03/07/2021 1354   K 4.6 03/07/2021 1354   CL 100 03/07/2021 1354   CO2 31 03/07/2021 1354   GLUCOSE 86 03/07/2021 1354   GLUCOSE 176 07/12/2008 0000   BUN 32 (H) 03/07/2021 1354   CREATININE 1.88 (H) 03/07/2021 1354   CREATININE 1.36 (H) 12/21/2015 1430   CALCIUM 9.1 03/07/2021 1354   GFRNONAA 36 (L) 11/09/2020 0452   GFRAA >60 05/25/2018 0310    BNP    Component Value Date/Time   BNP 471.6 (H) 08/05/2020 1403    ProBNP    Component Value Date/Time   PROBNP 30.0 12/13/2012 1615    Specialty Problems       Pulmonary Problems   COPD with emphysema, on O2    FEV1 67%       Sleep apnea           Dyspnea on exertion   Pulmonary fibrosis (HCC)    Favor IPF DLCO 32%      Chronic respiratory failure with hypoxia (HCC)   Hypoxia   Acute on chronic respiratory failure with hypoxia (HCC)   SOB (shortness of breath)    No Known Allergies  Immunization History  Administered Date(s) Administered   Fluad Quad(high Dose 65+) 12/31/2018, 05/07/2020, 03/07/2021   Influenza Split 03/18/2011   Influenza Whole  01/10/2009, 01/11/2010   Influenza, High Dose Seasonal PF 02/14/2013, 12/29/2014, 12/21/2015, 03/10/2017, 01/11/2018   Influenza, Seasonal, Injecte, Preservative Fre 04/06/2012   Influenza,inj,Quad PF,6+ Mos 02/16/2014   PFIZER Comirnaty(Gray Top)Covid-19 Tri-Sucrose Vaccine 08/30/2020   PFIZER(Purple Top)SARS-COV-2 Vaccination 06/30/2019, 07/21/2019, 01/26/2020, 03/07/2021   Pfizer Covid-19 Vaccine Bivalent Booster 50yrs & up 03/07/2021   Pneumococcal Conjugate-13 12/29/2014   Pneumococcal Polysaccharide-23 10/20/1999, 07/11/2008, 06/15/2013   Td 04/10/1999, 01/11/2010    Past Medical History:  Diagnosis Date   Anemia    iron   Arthritis  Benign prostatic hypertrophy    COPD (chronic obstructive pulmonary disease) (HCC)    24/7 O2   Diabetes mellitus    per Dr Posey Pronto   Epidermal cyst of face    left lower lateral cheek   Gout    History of colon polyps    History of kidney stones 1980s   Hyperlipidemia    Hypertension    Kidney disease    Pulmonary fibrosis ([facility]) 2014   Sleep apnea    Urothelial carcinoma ([facility]) 05/24/2020    Tobacco History: Social History   Tobacco Use  Smoking Status Former   Packs/day: 1.50   Years: 40.00   Pack years: 60.00   Types: Cigarettes   Quit date: 04/07/1996   Years since quitting: 25.1  Smokeless Tobacco Never   Counseling given: Not Answered    Continue to not smoke  Outpatient Encounter Medications as of 03/14/2021  Medication Sig   albuterol (VENTOLIN HFA) 108 (90 Base) MCG/ACT inhaler Inhale 2 puffs into the lungs every 4 (four) hours as needed for wheezing or shortness of breath.   amLODipine (NORVASC) 10 MG tablet Take 1 tablet (10 mg total) by mouth daily.   atorvastatin (LIPITOR) 80 MG tablet Take 1 tablet (80 mg total) by mouth daily. (Patient taking differently: Take 80 mg by mouth every evening.)   cholecalciferol (VITAMIN D3) 25 MCG (1000 UNIT) tablet Take 1,000 Units by mouth daily.   colchicine 0.6 MG tablet Take  1 tablet (0.6 mg total) by mouth 2 (two) times daily as needed. (Patient taking differently: Take 0.6 mg by mouth 2 (two) times daily as needed (gout).)   doxazosin (CARDURA) 4 MG tablet Take 1 tablet (4 mg total) by mouth at bedtime.   ferrous fumarate (HEMOCYTE - 106 MG FE) 325 (106 Fe) MG TABS tablet Take 1 tablet (106 mg of iron total) by mouth 2 (two) times daily.   furosemide (LASIX) 20 MG tablet Take 20 mg by mouth daily.   HYDROcodone-acetaminophen (NORCO/VICODIN) 5-325 MG tablet Take 1 tablet by mouth every 6 (six) hours as needed for moderate pain.   ipratropium-albuterol (DUONEB) 0.5-2.5 (3) MG/3ML SOLN Take 3 mLs by nebulization in the morning and at bedtime.   ketoconazole (NIZORAL) 2 % cream Apply 1 application topically daily. (Patient taking differently: Apply 1 application topically daily as needed (moisture around groin).)   losartan (COZAAR) 50 MG tablet Take 1 tablet by mouth daily.   MAGNESIUM PO Take 1 tablet by mouth daily.   mometasone-formoterol (DULERA) 100-5 MCG/ACT AERO Inhale 2 puffs into the lungs 2 (two) times daily.   OXYGEN Inhale 3 L into the lungs continuous.   pioglitazone (ACTOS) 45 MG tablet Take 45 mg by mouth daily.   polyethylene glycol (MIRALAX / GLYCOLAX) 17 g packet Take 17 g by mouth daily.   saline (AYR) GEL Place 1 application into both nostrils every 12 (twelve) hours.   senna (SENOKOT) 8.6 MG TABS tablet Take 1 tablet (8.6 mg total) by mouth 2 (two) times daily.   sodium chloride (OCEAN) 0.65 % SOLN nasal spray Place 1 spray into both nostrils as needed for congestion.   [DISCONTINUED] carvedilol (COREG) 3.125 MG tablet Take 1 tablet (3.125 mg total) by mouth 2 (two) times daily with a meal.   cloNIDine (CATAPRES) 0.1 MG tablet Take 1 tablet (0.1 mg total) by mouth 2 (two) times daily. (Patient not taking: Reported on 03/14/2021)   Multiple Vitamin (MULTIVITAMIN) tablet Take 1 tablet by mouth daily. (Patient not  taking: Reported on 03/14/2021)    [DISCONTINUED] COVID-19 mRNA bivalent vaccine, Pfizer, (PFIZER COVID-19 VAC BIVALENT) injection Inject into the muscle.   No facility-administered encounter medications on file as of 03/14/2021.     Review of Systems  Review of Systems  n/a Physical Exam  BP 136/60 (BP Location: Left Arm, Patient Position: Sitting, Cuff Size: Normal)   Pulse 71   Temp (!) 97.5 F (36.4 C) (Oral)   Ht 5\' 8"  (1.727 m)   Wt 182 lb 12.8 oz (82.9 kg)   SpO2 90%   BMI 27.79 kg/m   Wt Readings from Last 5 Encounters:  03/14/21 182 lb 12.8 oz (82.9 kg)  03/07/21 179 lb (81.2 kg)  11/15/20 179 lb 8 oz (81.4 kg)  11/07/20 175 lb 7.8 oz (79.6 kg)  08/30/20 178 lb 8 oz (81 kg)    BMI Readings from Last 5 Encounters:  03/14/21 27.79 kg/m  03/07/21 27.22 kg/m  11/15/20 27.29 kg/m  11/07/20 26.68 kg/m  08/30/20 27.14 kg/m     Physical Exam General: Thin, sitting in exam chair Neck: Supple, no JVP Eyes: EOMI, no icterus Pulmonary: Distant sounds throughout, NWOB on [place] Cardiovascular: Regular rate and rhythm, no murmur   Assessment & Plan:   Dyspnea exertion: Multifactorial, related to COPD, deconditioning, possible pulmonary fibrosis although do query if this is just septal and peripheral emphysematous changes, significant valvular disease, diastolic dysfunction, left atrial hypertension.   COPD: Presumed based on emphysema, PFTs showed restrictive pattern although flow volume curve consistent with obstruction.  Nebulizer machine ordered today.  Instructed to use DuoNebs twice daily as he did not follow through with this last visit.  If helpful, would plan to escalate bronchodilator therapy.  Hypoxemic respiratory failure, chronic: related to emphysema, COPD. Had high BP while admitted and improved with diuretics - suspect flash pulmonary edema. Chronic LA dilation so pre-disposed to fluid accumulation. Advised to continue lasix started during admission.  New order for POC device as he  qualifies a 4 L pulse today in office  Return in about 3 months (around 06/12/2021).   Lanier Clam, MD 05/17/2021

## 2021-03-14 NOTE — Patient Instructions (Signed)
Nice to see you again  Try to use the  DuoNebs in the morning and mid afternoon.  See if this helps with your breathing.  We will send a new order and to see if we can get you a portable oxygen concentrator.  He needed 4 L on the concentrator with exertion.  Return to clinic in 3 months or sooner as needed with Dr.

## 2021-03-21 ENCOUNTER — Telehealth: Payer: Self-pay | Admitting: Internal Medicine

## 2021-03-21 NOTE — Telephone Encounter (Signed)
[facility] just wanted to make Dr. Larose Kells aware that the patient's endo doctor ,Dr. Posey Pronto, changed the strength of the Carvedilol from 3.125 to 12.5mg .

## 2021-03-22 NOTE — Telephone Encounter (Signed)
Noted. thx 

## 2021-03-22 NOTE — Telephone Encounter (Signed)
FYI. I have updated med list.

## 2021-03-27 ENCOUNTER — Telehealth: Payer: Self-pay | Admitting: Pulmonary Disease

## 2021-03-27 DIAGNOSIS — J9611 Chronic respiratory failure with hypoxia: Secondary | ICD-10-CM

## 2021-03-28 NOTE — Telephone Encounter (Signed)
Called and spoke with pt and he is aware of order placed for [facility] to try and get his set up with POC

## 2021-03-29 ENCOUNTER — Other Ambulatory Visit: Payer: Self-pay | Admitting: Primary Care

## 2021-03-29 DIAGNOSIS — J9611 Chronic respiratory failure with hypoxia: Secondary | ICD-10-CM

## 2021-05-02 DIAGNOSIS — C679 Malignant neoplasm of bladder, unspecified: Secondary | ICD-10-CM | POA: Diagnosis not present

## 2021-05-02 DIAGNOSIS — E211 Secondary hyperparathyroidism, not elsewhere classified: Secondary | ICD-10-CM | POA: Diagnosis not present

## 2021-05-02 DIAGNOSIS — N183 Chronic kidney disease, stage 3 unspecified: Secondary | ICD-10-CM | POA: Diagnosis not present

## 2021-05-02 DIAGNOSIS — R809 Proteinuria, unspecified: Secondary | ICD-10-CM | POA: Diagnosis not present

## 2021-05-02 DIAGNOSIS — N189 Chronic kidney disease, unspecified: Secondary | ICD-10-CM | POA: Diagnosis not present

## 2021-05-02 DIAGNOSIS — E559 Vitamin D deficiency, unspecified: Secondary | ICD-10-CM | POA: Diagnosis not present

## 2021-05-02 DIAGNOSIS — R309 Painful micturition, unspecified: Secondary | ICD-10-CM | POA: Diagnosis not present

## 2021-05-02 DIAGNOSIS — J449 Chronic obstructive pulmonary disease, unspecified: Secondary | ICD-10-CM | POA: Diagnosis not present

## 2021-05-02 DIAGNOSIS — E1169 Type 2 diabetes mellitus with other specified complication: Secondary | ICD-10-CM | POA: Diagnosis not present

## 2021-05-02 DIAGNOSIS — I10 Essential (primary) hypertension: Secondary | ICD-10-CM | POA: Diagnosis not present

## 2021-05-02 DIAGNOSIS — D649 Anemia, unspecified: Secondary | ICD-10-CM | POA: Diagnosis not present

## 2021-05-02 DIAGNOSIS — J9611 Chronic respiratory failure with hypoxia: Secondary | ICD-10-CM | POA: Diagnosis not present

## 2021-05-08 DIAGNOSIS — C678 Malignant neoplasm of overlapping sites of bladder: Secondary | ICD-10-CM | POA: Diagnosis not present

## 2021-05-16 ENCOUNTER — Encounter: Payer: Self-pay | Admitting: Internal Medicine

## 2021-05-16 ENCOUNTER — Telehealth: Payer: Self-pay | Admitting: Pulmonary Disease

## 2021-05-17 ENCOUNTER — Encounter: Payer: Self-pay | Admitting: Pulmonary Disease

## 2021-05-17 NOTE — Telephone Encounter (Signed)
Spoke with the pt's daughter  She states that [facility] told pt he can not have poc  Dr Silas Flood- according to the notes [facility] faxed a form for you to sign for OCD titration  Have you seen this form?

## 2021-05-17 NOTE — Telephone Encounter (Signed)
I do not recall seeing the form specifically, I do sign a lot of oxygen forms.  If needed I can sign a new form.

## 2021-05-17 NOTE — Telephone Encounter (Signed)
[facility] unable to provide POC over 3L. Options are to contact [employer] or other oxygen company to see if provide POC that can do 4L or more. OCD form being faxed and will fill out when arrives.

## 2021-05-17 NOTE — Telephone Encounter (Signed)
Called [facility] and they are faxing new form.

## 2021-05-21 NOTE — Telephone Encounter (Signed)
Called and spoke with patient that [facility] is unable to provide POC over 3L and that we may need to discuss changing DME companies if he truly wants a POC. Patient voiced understanding and will discuss at next office visit. Nothing further needed.

## 2021-06-12 DIAGNOSIS — Z5111 Encounter for antineoplastic chemotherapy: Secondary | ICD-10-CM | POA: Diagnosis not present

## 2021-06-12 DIAGNOSIS — C678 Malignant neoplasm of overlapping sites of bladder: Secondary | ICD-10-CM | POA: Diagnosis not present

## 2021-06-18 ENCOUNTER — Telehealth: Payer: Self-pay | Admitting: Pulmonary Disease

## 2021-06-18 DIAGNOSIS — J439 Emphysema, unspecified: Secondary | ICD-10-CM

## 2021-06-18 DIAGNOSIS — R0902 Hypoxemia: Secondary | ICD-10-CM

## 2021-06-18 NOTE — Telephone Encounter (Signed)
Lm x1 for patient.  

## 2021-06-19 ENCOUNTER — Telehealth: Payer: Self-pay | Admitting: Pulmonary Disease

## 2021-06-19 DIAGNOSIS — Z5111 Encounter for antineoplastic chemotherapy: Secondary | ICD-10-CM | POA: Diagnosis not present

## 2021-06-19 DIAGNOSIS — C678 Malignant neoplasm of overlapping sites of bladder: Secondary | ICD-10-CM | POA: Diagnosis not present

## 2021-06-19 NOTE — Telephone Encounter (Signed)
Called patiens daughter and informed her that I placed an order for her dad to get new oxygen supplies. Nothing further needed  ?

## 2021-06-19 NOTE — Telephone Encounter (Signed)
Attempted to call patient and left voicemail for patient to call office back. Letter will be sent  ?

## 2021-06-24 ENCOUNTER — Telehealth: Payer: Self-pay

## 2021-06-24 NOTE — Telephone Encounter (Signed)
Oxygen concentrator re-cert signed and faxed to [employer] at [phone]. Form sent for scanning.  ?

## 2021-06-26 DIAGNOSIS — C678 Malignant neoplasm of overlapping sites of bladder: Secondary | ICD-10-CM | POA: Diagnosis not present

## 2021-06-26 DIAGNOSIS — Z5111 Encounter for antineoplastic chemotherapy: Secondary | ICD-10-CM | POA: Diagnosis not present

## 2021-06-27 ENCOUNTER — Telehealth: Payer: Self-pay | Admitting: Pulmonary Disease

## 2021-06-27 ENCOUNTER — Encounter: Payer: Self-pay | Admitting: Pulmonary Disease

## 2021-06-27 DIAGNOSIS — J439 Emphysema, unspecified: Secondary | ICD-10-CM

## 2021-06-27 DIAGNOSIS — R0902 Hypoxemia: Secondary | ICD-10-CM

## 2021-06-27 NOTE — Telephone Encounter (Signed)
Hey MH I just need clarification on what type of O2 mask to order for this patient. He states that he doesn't want the nasal cannula anymore.  ? ?Is a regular O2 mask ok with you? Or do you want a certain type of mask? ? ?Please advise MH  ?

## 2021-07-02 NOTE — Telephone Encounter (Signed)
Placed new order for Venti mask 28% per MH new order. Nothing further  ?

## 2021-07-02 NOTE — Telephone Encounter (Signed)
Can do venti mask 28% but will only work on continuous flow, not his pulsed POC. Not sure why he wants or needs a different O2 delivery mask. Any more O2 and he will need 2 concentrators. Can you ask why the change?

## 2021-07-03 ENCOUNTER — Telehealth: Payer: Self-pay | Admitting: Pulmonary Disease

## 2021-07-03 NOTE — Telephone Encounter (Signed)
Ok that is fine

## 2021-07-03 NOTE — Telephone Encounter (Signed)
Routing to April so she can document once fax has been received and sent back to [facility]. ?

## 2021-07-03 NOTE — Telephone Encounter (Signed)
Called Bradley Johns from [employer] and she states she will fax over new order that [place] needs to sign for the oxy mask. Informed MH of fax that is to be sent. Will waiti for fax.  ?

## 2021-07-03 NOTE — Telephone Encounter (Signed)
Spoke to [place] with [facility]. ?[place] Mole is wanting to know if patient can use oxy mask instead of the venti.  Venti is only compatible with 2L. Patient sometimes uses up to 5L. ? ?Dr. Silas Flood, please advise. thanks ?

## 2021-07-05 NOTE — Telephone Encounter (Signed)
Received form and had MH sign form. Nothing further needed  ?

## 2021-07-11 ENCOUNTER — Ambulatory Visit: Payer: Medicare HMO | Admitting: Internal Medicine

## 2021-07-16 NOTE — Telephone Encounter (Signed)
Pt's spouse was contacted by April after this encounter and documentation is in a separate encounter. Nothing further needed. ?

## 2021-07-25 ENCOUNTER — Ambulatory Visit: Payer: Medicare HMO | Admitting: Pulmonary Disease

## 2021-07-25 ENCOUNTER — Encounter: Payer: Self-pay | Admitting: Pulmonary Disease

## 2021-07-25 VITALS — BP 122/68 | HR 66 | Temp 98.6°F | Ht 68.0 in | Wt 165.0 lb

## 2021-07-25 DIAGNOSIS — J849 Interstitial pulmonary disease, unspecified: Secondary | ICD-10-CM

## 2021-07-25 NOTE — Patient Instructions (Signed)
Nice to see you again   ? ?No medication changes   ? ?We will look into the portable oxygen concentrator, we will go through [facility] and see if there is an option for [employer] or another portable oxygen concentrator.  If not we are happy to go through [employer] directly. ? ?I ordered a repeat CT scan to keep an eye on the scarring or fibrosis in the lung.  I will let you know the results. ? ?Return to clinic in 3 months or sooner if needed with Dr. Silas Flood ?

## 2021-07-26 ENCOUNTER — Telehealth: Payer: Self-pay

## 2021-07-26 NOTE — Telephone Encounter (Signed)
Called and left voicemail for patient to call office back. Unfortunately [facility] does not supply the items that the patient is wanting with his oxygen (ex: backback for the oxygen) ? ?Need to get an list of what he is receiving from [facility].  ? ?Pt talked in office about wanting to switch to [facility].  ? ?Need inventory first before placing new order to [facility]. Need to place order to switch DME companies but we need a list of what we are switching.  ? ? ?

## 2021-07-27 NOTE — Progress Notes (Signed)
? ?'@Patient'$  ID: Bradley Johns, male    DOB: 1934/12/29, 87 y.o.   MRN: [id number] ? ?Chief Complaint  ?Patient presents with  ? Follow-up  ?  Pt does have his new mask. Patient wants to go back to [facility] and get his backpack oxygen that he had before. I advised his that I would call [facility] first to see if they supply it. Other then that pt has no issues. Currently 3L pulsed.   ? ? ?Referring provider: ?Colon Branch, MD ? ?HPI:  ? ?87 y.o. with COPD and possible pulmonary fibrosis whom we are seeing in follow up.  ? ?Overall doing well.  Feels like inhalers are helpful.  Using pulsed oxygen via a cylinder.  Requests new order for POC.  We will work on this.  No real complaints today.  Breathing feels stable.  Baseline dyspnea unchanged. ? ?HPI at initial visit: ?Patient states he is doing okay.  He has baseline dyspnea on exertion.  He thinks is gradual worse over time but not much change over the last several months.  Certainly worse over the last couple months although he thinks he slowly getting stamina back.  He was hospitalized on Christmas Day 2021.  He had a fall at home.  Hemoglobin was low.  He received blood.  VQ scan was negative for PE.  He got much worse couple days later and was given Lasix and Solu-Medrol for worsening hypoxemia.  He has brief with described as bradycardic PEA arrest and received CPR and chemical resuscitation.  He was not intubated.  Unclear if he truly lost a pulse.  He was monitored in the ICU after this.  He suddenly improved and was discharged home.  Had a rehab stent. ? ?He is now back at home.  Again dyspnea worse than prior.  Not much worse overall from several months ago.  He thinks he slowly get the strength back from hospitalization.  He is using oxygen as prescribed.  Using around 4 L mostly.  He is on 3 L today.  No recent upper respiratory infections in the last month.  Hemoglobin most recently last month was greater than 10.  His oxygen saturation today is 95% on  supplemental O2. ? ?Reviewed chest images most recent chest x-ray 12/28 2021 which shows interstitial changes worsened from admission concerning for pulmonary edema on my interpretation.  Most recent cross-sectional imaging 06/09/2018 reviewed which shows significant emphysematous changes in the upper lobes, paraseptal and peripheral cystic changes throughout consistent with emphysema versus pulmonary fibrosis on my interpretation.  Most recent TTE 03/2020 reviewed with normal EF, reportedly normal diastolic pressures, dilated LA, MVR, mild AS, elevated RA pressures, RA size normal, RV size and function normal, moderately elevated PASP. ? 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 ?Questionaires / Pulmonary Flowsheets:  ? ?ACT:  ?   ? View : No data to display.  ?  ?  ?  ? ? ?MMRC: ?mMRC Dyspnea Scale mMRC Score  ?06/14/2020 ? 3:12 PM 3  ? ? ?Epworth:  ?   ? View : No data to display.  ?  ?  ?  ? ? ?Tests:  ? ?  FENO:  ?No results found for: NITRICOXIDE ? ?PFT: ? ?  Latest Ref Rng & Units 04/21/2014  ?  9:38 AM  ?PFT Results  ?FVC-Pre L 1.99    ?FVC-Predicted Pre % 59    ?FVC-Post L 2.22    ?FVC-Predicted Post % 66    ?Pre FEV1/FVC % % 74    ?Post FEV1/FCV % % 74    ?FEV1-Pre L 1.47    ?FEV1-Predicted Pre % 60    ?FEV1-Post L 1.65    ?DLCO uncorrected ml/min/mmHg 8.10    ?DLCO UNC% % 27    ?DLVA Predicted % 51    ?TLC L 4.21    ?TLC % Predicted % 64    ?RV % Predicted % 81    ?PFTs reviewed on my interpretation revealed moderate restriction with reduced DLCO, normal FEV1/FVC ratio, FEV1 and FVC both reduced, DLCO severely  reduced ? ?WALK:  ? ?  12/28/2012  ?  4:49 PM 12/28/2012  ?  4:43 PM 12/14/2012  ?  4:11 PM 12/14/2012  ?  4:10 PM  ?SIX MIN WALK  ?Supplimental Oxygen during Test? (L/min) Yes Yes Yes No  ?O2 Flow Rate 3 L/min 2 L/min 3 L/min   ?Type Continuous Continuous Continuous   ?Tech Comments:  Pt was placed on 3 liters O2 cont flowand then recovered 91%  Pt desaturated 1 st lap 83% 2 liters. Pt was placed on 3 liters cont flow and O2 sats was 91%. pt did 2 laps with 3 liters cont flow and sats maintained 95%   ? ? ?Imaging: ?Personally reviewed and as per EMR discussion in this note ? ?Lab Results: ?Personally reviewed ?CBC ?   ?Component Value Date/Time  ? WBC 6.9 11/09/2020 0452  ? RBC 3.33 (L) 11/09/2020 0452  ? HGB 10.6 (L) 11/09/2020 0452  ? HCT 33.4 (L) 11/09/2020 0452  ? PLT 130 (L) 11/09/2020 0452  ? MCV 100.3 (H) 11/09/2020 0452  ? MCH 31.8 11/09/2020 0452  ? MCHC 31.7 11/09/2020 0452  ? RDW 14.6 11/09/2020 0452  ? LYMPHSABS 0.8 08/30/2020 1343  ? MONOABS 0.5 08/30/2020 1343  ? EOSABS 0.3 08/30/2020 1343  ? BASOSABS 0.1 08/30/2020 1343  ? ? ?BMET ?   ?Component Value Date/Time  ? NA 136 03/07/2021 1354  ? K 4.6 03/07/2021 1354  ? CL 100 03/07/2021 1354  ? CO2 31 03/07/2021 1354  ? GLUCOSE 86 03/07/2021 1354  ? GLUCOSE 176 07/12/2008 0000  ? BUN 32 (H) 03/07/2021 1354  ? CREATININE 1.88 (H) 03/07/2021 1354  ? CREATININE 1.36 (H) 12/21/2015 1430  ? CALCIUM 9.1 03/07/2021 1354  ? GFRNONAA 36 (L) 11/09/2020 0452  ? GFRAA >60 05/25/2018 0310  ? ? ?BNP ?   ?Component Value Date/Time  ? BNP 471.6 (H) 08/05/2020 1403  ? ? ?ProBNP ?   ?Component Value Date/Time  ? PROBNP 30.0 12/13/2012 1615  ? ? ?Specialty Problems   ? ?  ? Pulmonary Problems  ? COPD with emphysema, on O2  ?  FEV1 67% ? ?  ?  ? Sleep apnea  ?   ? ?  ?  ? Dyspnea on exertion  ? Pulmonary fibrosis ([place])  ?  Favor IPF ?DLCO 32% ?  ?  ? Chronic respiratory failure with hypoxia (HCC)  ? Hypoxia  ? Acute on chronic respiratory failure with hypoxia (HCC)  ? SOB  (shortness of breath)  ? ? ?No Known Allergies ? ?Immunization History  ?Administered Date(s) Administered  ? Fluad  Quad(high Dose 65+) 12/31/2018, 05/07/2020, 03/07/2021  ? Influenza Split 03/18/2011  ? Influenza Whole 01/10/2009, 01/11/2010  ? Influenza, High Dose Seasonal PF 02/14/2013, 12/29/2014, 12/21/2015, 03/10/2017, 01/11/2018  ? Influenza, Seasonal, Injecte, Preservative Fre 04/06/2012  ? Influenza,inj,Quad PF,6+ Mos 02/16/2014  ? PFIZER Comirnaty(Gray Top)Covid-19 Tri-Sucrose Vaccine 08/30/2020  ? PFIZER(Purple Top)SARS-COV-2 Vaccination 06/30/2019, 07/21/2019, 01/26/2020, 03/07/2021  ? Pension scheme manager 67yr & up 03/07/2021  ? Pneumococcal Conjugate-13 12/29/2014  ? Pneumococcal Polysaccharide-23 10/20/1999, 07/11/2008, 06/15/2013  ? Td 04/10/1999, 01/11/2010  ? ? ?Past Medical History:  ?Diagnosis Date  ? Anemia   ? iron  ? Arthritis   ? Benign prostatic hypertrophy   ? COPD (chronic obstructive pulmonary disease) ([facility]   ? 24/7 O2  ? Diabetes mellitus   ? per Dr PPosey Pronto ? Epidermal cyst of face   ? left lower lateral cheek  ? Gout   ? History of colon polyps   ? History of kidney stones 1980s  ? Hyperlipidemia   ? Hypertension   ? Kidney disease   ? [place]monary fibrosis [place] 2014  ? Sleep apnea   ? [place] 05/24/[place]  ? ? ?Tobacco History: ?Social History  ? ?Tobacco Use  ?Smoking Status Former  ? Packs/day: 1.50  ? Years: 40.00  ? Pack years: 60.00  ? Types: Cigarettes  ? Quit date: 04/07/1996  ? Years since quitting: 25.3  ?Smokeless Tobacco Never  ? ?Counseling given: Not Answered ? ? ? ?Continue to not smoke ? ?Outpatient Encounter Medications as of 07/25/2021  ?Medication Sig  ? albuterol (VENTOLIN HFA) 108 (90 Base) MCG/ACT inhaler Inhale 2 puffs into the lungs every 4 (four) hours as needed for wheezing or shortness of breath.  ? amLODipine (NORVASC) 10 MG tablet Take 1 tablet (10 mg total) by mouth daily.  ? atorvastatin (LIPITOR) 80 MG tablet Take 1 tablet  (80 mg total) by mouth daily. (Patient taking differently: Take 80 mg by mouth every evening.)  ? carvedilol (COREG) 12.5 MG tablet Take 1 tablet by mouth in the morning and at bedtime.  ? cholecalciferol (VITAMIN D3) 25 MCG (100

## 2021-07-29 ENCOUNTER — Telehealth: Payer: Self-pay | Admitting: Pulmonary Disease

## 2021-07-31 NOTE — Telephone Encounter (Signed)
Message from April Smith, RN ?  3:59 PM ?Note ?Called and left voicemail for patient to call office back. Unfortunately [facility] does not supply the items that the patient is wanting with his oxygen (ex: backback for the oxygen) ?  ?Need to get an list of what he is receiving from [facility].  ?  ?Pt talked in office about wanting to switch to [facility].  ?  ?Need inventory first before placing new order to [facility]. Need to place order to switch DME companies but we need a list of what we are switching.   ?  ? ? ?Called and spoke with pt letting him know the info from April. He said that he is currently working with [facility] trying to get a list together of what all supplies he receives from [facility]. He said once he had that list that he would call back to provide April with that information. ? ? ?Routing this to April. ?

## 2021-08-01 ENCOUNTER — Encounter: Payer: Self-pay | Admitting: Internal Medicine

## 2021-08-01 ENCOUNTER — Ambulatory Visit (INDEPENDENT_AMBULATORY_CARE_PROVIDER_SITE_OTHER): Payer: Medicare HMO | Admitting: Internal Medicine

## 2021-08-01 VITALS — BP 136/60 | HR 69 | Temp 98.0°F | Resp 20 | Ht 68.0 in | Wt 175.4 lb

## 2021-08-01 DIAGNOSIS — I10 Essential (primary) hypertension: Secondary | ICD-10-CM | POA: Diagnosis not present

## 2021-08-01 DIAGNOSIS — N1832 Chronic kidney disease, stage 3b: Secondary | ICD-10-CM

## 2021-08-01 DIAGNOSIS — E119 Type 2 diabetes mellitus without complications: Secondary | ICD-10-CM

## 2021-08-01 DIAGNOSIS — Z91199 Patient's noncompliance with other medical treatment and regimen due to unspecified reason: Secondary | ICD-10-CM

## 2021-08-01 DIAGNOSIS — J439 Emphysema, unspecified: Secondary | ICD-10-CM

## 2021-08-01 MED ORDER — COLCHICINE 0.6 MG PO TABS: 0.6000 mg | ORAL_TABLET | Freq: Two times a day (BID) | ORAL | 6 refills | Status: DC | PRN

## 2021-08-01 NOTE — Progress Notes (Signed)
? ?Subjective:  ? ? Patient ID: Bradley Johns, male    DOB: 09-Aug-1934, 87 y.o.   MRN: [id number] ? ?DOS:  08/01/2021 ?Type of visit - description: f/u ? ?Here with his wife. ?No new concerns. ?Denies fever chills ?No lower extremity edema ?No cough ? ?Review of Systems ?See above  ? ?Past Medical History:  ?Diagnosis Date  ? Anemia   ? iron  ? Arthritis   ? Benign prostatic hypertrophy   ? COPD (chronic obstructive pulmonary disease) ([place])   ? 24/7 O2  ? Diabetes mellitus   ? per Dr Posey Pronto  ? Epidermal cyst of face   ? left lower lateral cheek  ? Gout   ? History of colon polyps   ? History of kidney stones 1980s  ? Hyperlipidemia   ? Hypertension   ? Kidney disease   ? Pulmonary fibrosis ([place]) 2014  ? Sleep apnea   ? Urothelial carcinoma ([place]) 05/24/2020  ? ? ?Past Surgical History:  ?Procedure Laterality Date  ? COLONOSCOPY WITH PROPOFOL N/A 05/18/2018  ? Procedure: COLONOSCOPY WITH PROPOFOL;  Surgeon: Carol Ada, MD;  Location: [facility];  Service: Endoscopy;  Laterality: N/A;  ? POLYPECTOMY  05/18/2018  ? Procedure: POLYPECTOMY;  Surgeon: Carol Ada, MD;  Location: [facility];  Service: Endoscopy;;  ? TOE SURGERY    ? TRANSURETHRAL RESECTION OF BLADDER TUMOR WITH MITOMYCIN-C N/A 08/02/2020  ? Procedure: TRANSURETHRAL RESECTION OF BLADDER TUMOR;  Surgeon: Franchot Gallo, MD;  Location: [facility];  Service: Urology;  Laterality: N/A;  1 HR  ? TRANSURETHRAL RESECTION OF BLADDER TUMOR WITH MITOMYCIN-C N/A 11/08/2020  ? Procedure: TRANSURETHRAL RESECTION OF BLADDER TUMOR;  Surgeon: Franchot Gallo, MD;  Location: [facility];  Service: Urology;  Laterality: N/A;  ? ? ?Current Outpatient Medications  ?Medication Instructions  ? albuterol (VENTOLIN HFA) 108 (90 Base) MCG/ACT inhaler 2 puffs, Inhalation, Every 4 hours PRN  ? amLODipine (NORVASC) 10 mg, Oral, Daily  ? atorvastatin (LIPITOR) 80 mg, Oral, Daily  ? carvedilol (COREG) 12.5 MG tablet 1 tablet, Oral, 2 times daily  ? cholecalciferol (VITAMIN D3) 1,000  Units, Oral, Daily  ? cloNIDine (CATAPRES) 0.1 mg, Oral, 2 times daily  ? colchicine 0.6 mg, Oral, 2 times daily PRN  ? doxazosin (CARDURA) 4 mg, Oral, Daily at bedtime  ? ferrous fumarate (HEMOCYTE - 106 MG FE) 325 (106 Fe) MG TABS tablet 106 mg of iron, Oral, 2 times daily  ? furosemide (LASIX) 20 mg, Oral, Daily  ? HYDROcodone-acetaminophen (NORCO/VICODIN) 5-325 MG tablet 1 tablet, Oral, Every 6 hours PRN  ? ipratropium-albuterol (DUONEB) 0.5-2.5 (3) MG/3ML SOLN 3 mLs, Nebulization, 2 times daily  ? ketoconazole (NIZORAL) 2 % cream 1 application., Topical, Daily  ? losartan (COZAAR) 50 MG tablet 1 tablet, Oral, Daily  ? MAGNESIUM PO 1 tablet, Oral, Daily  ? Multiple Vitamin (MULTIVITAMIN) tablet 1 tablet, Oral, Daily,    ? OXYGEN 3 L, Inhalation, Continuous  ? pioglitazone (ACTOS) 45 mg, Oral, Daily  ? polyethylene glycol (MIRALAX / GLYCOLAX) 17 g, Oral, Daily  ? saline (AYR) GEL 1 application., Each Nare, Every 12 hours  ? senna (SENOKOT) 8.6 mg, Oral, 2 times daily  ? sodium chloride (OCEAN) 0.65 % SOLN nasal spray 1 spray, Each Nare, As needed  ? ? ?   ?Objective:  ? Physical Exam ?BP 136/60 (BP Location: Left Arm, Patient Position: Sitting, Cuff Size: Small)   Pulse 69   Temp 98 ?F (36.7 ?C) (Oral)   Resp 20  Ht '5\' 8"'$  (1.727 m)   Wt 175 lb 6 oz (79.5 kg)   SpO2 90% Comment: 3L/min  BMI 26.67 kg/m?  ?General:   ?Well developed, NAD, BMI noted. ?HEENT:  ?Normocephalic . Face symmetric, atraumatic ?Lungs:  ?Dry crackles at bases ?Normal respiratory effort, no intercostal retractions, no accessory muscle use. ?Heart: RRR,  no murmur.  ?Lower extremities: no pretibial edema bilaterally  ?Skin: Not pale. Not jaundice ?Neurologic:  ?alert & oriented X3.  ?Speech normal, gait appropriate for age and unassisted ?Psych--  ?Cognition and judgment appear intact.  ?Cooperative with normal attention span and concentration.  ?Behavior appropriate. ?No anxious or depressed appearing.  ? ?   ?Assessment    ? ?Assessment  ?DM Dr. Posey Pronto  ?HTN ?Hyperlipidemia ?CKD: Sees nephrology ?DJD-- rarely uses hydrocodone ?Gout -- occ colchicine ?PULM: Chronic respiratory failure with hypoxia ?--COPD on oxygen  24/7   ?--OSA - no compliant w/ cpap ?--Pulmonary fibrosis ?GU: ?---BPH    ?---High-grade papillary urothelial carcinoma Dx 07/2020, s/p surgery ? ? ?PLAN ?DM: Per Endo ?HTN: On multiple medications, BP looks good today. ?COPD, hypoxemic respiratory failure chronic. ?Last visit with pulmonary 07/25/2021, O2 sat at that time was 92%.  PFTs were described as both restrictive and obstructive. ?DOE was felt to be multifactorial including pulmonary disease, valvular disease, diastolic dysfunction etc. ?They rx high-resolution CT chest and  considering antifibrotic therapy for lung disease ?O2 sat upon arrival 81% on 2 L, increased to 3 L and rest, O2 sat increased to 90%.  At home he is actually always in the 90s. ?He states he is not using albuterol twice daily as recommended, simply declines, benefits of f/u doctors instructions discussed with him. ?CKD: To have blood work for nephrology tomorrow. ?Hyperlipidemia: I declined need to stick the patient today since he is having blood work Architectural technologist.  Last LDL satisfactory on atorvastatin. ?Gout: RF colchicine, very rarely takes hydrocodone. ?Med compliance: We had a long discussion about his medication list,at his request  I explained him what medication is for one by one. ?RTC 4 months ? ?Time spent 30 minutes.  Long discussion about medication compliance, compliance with doctor's advice, I explained to him what  single medication is for. ?He verbalized appreciation ? ?  ?

## 2021-08-01 NOTE — Patient Instructions (Addendum)
Please schedule a Medicare Wellness visit.  ? ?Per our records you are due for your diabetic eye exam. Please contact your eye doctor to schedule an appointment. Please have them send copies of your office visit notes to [place]. Our fax number is [phone]. If you need a referral to an eye doctor please let us know. ? ? ?Check the  blood pressure regularly ?BP GOAL is between 110/65 and  135/85. ?If it is consistently higher or lower, let me know ? ? ? ? ?GO TO THE FRONT DESK, PLEASE SCHEDULE YOUR APPOINTMENTS ?Come back for a check up in 4 months  ?

## 2021-08-02 ENCOUNTER — Ambulatory Visit (HOSPITAL_BASED_OUTPATIENT_CLINIC_OR_DEPARTMENT_OTHER)
Admission: RE | Admit: 2021-08-02 | Discharge: 2021-08-02 | Disposition: A | Discharge status: Home / Self Care | Payer: Medicare HMO | Source: Ambulatory Visit | Attending: Pulmonary Disease | Admitting: Pulmonary Disease

## 2021-08-02 DIAGNOSIS — J849 Interstitial pulmonary disease, unspecified: Secondary | ICD-10-CM | POA: Diagnosis not present

## 2021-08-02 DIAGNOSIS — J479 Bronchiectasis, uncomplicated: Secondary | ICD-10-CM | POA: Diagnosis not present

## 2021-08-02 DIAGNOSIS — N189 Chronic kidney disease, unspecified: Secondary | ICD-10-CM | POA: Diagnosis not present

## 2021-08-02 DIAGNOSIS — J84112 Idiopathic pulmonary fibrosis: Secondary | ICD-10-CM | POA: Diagnosis not present

## 2021-08-02 DIAGNOSIS — J432 Centrilobular emphysema: Secondary | ICD-10-CM | POA: Diagnosis not present

## 2021-08-02 DIAGNOSIS — I2510 Atherosclerotic heart disease of native coronary artery without angina pectoris: Secondary | ICD-10-CM | POA: Diagnosis not present

## 2021-08-02 NOTE — Assessment & Plan Note (Signed)
DM: Per Endo ?HTN: On multiple medications, BP looks good today. ?COPD, hypoxemic respiratory failure chronic. ?Last visit with pulmonary 07/25/2021, O2 sat at that time was 92%.  PFTs were described as both restrictive and obstructive. ?DOE was felt to be multifactorial including pulmonary disease, valvular disease, diastolic dysfunction etc. ?They rx high-resolution CT chest and  considering antifibrotic therapy for lung disease ?O2 sat upon arrival 81% on 2 L, increased to 3 L and rest, O2 sat increased to 90%.  At home he is actually always in the 90s. ?He states he is not using albuterol twice daily as recommended, simply declines, benefits of f/u doctors instructions discussed with him. ?CKD: To have blood work for nephrology tomorrow. ?Hyperlipidemia: I declined need to stick the patient today since he is having blood work Architectural technologist.  Last LDL satisfactory on atorvastatin. ?Gout: RF colchicine, very rarely takes hydrocodone. ?Med compliance: We had a long discussion about his medication list,at his request  I explained him what medication is for one by one. ?RTC 4 months ? ?Time spent 30 minutes.  Long discussion about medication compliance, compliance with doctor's advice, I explained to him what  single medication is for. ?He verbalized appreciation ?

## 2021-08-05 ENCOUNTER — Telehealth: Payer: Self-pay | Admitting: Pulmonary Disease

## 2021-08-05 NOTE — Telephone Encounter (Signed)
Received call report from [facility] with [facility] on patient's HRCT done on 08/02/21. ?Dr. Silas Flood, please review the result/impression copied below: ? ?IMPRESSION: ?1. Thick-walled cystic lesion of the right middle lobe demonstrates ?increased nodularity and is concerning for primary lung malignancy, ?largest nodular component measures 8.5 mm in mean diameter. ?Recommend PET-CT for further evaluation. ?2. Emphysema combined with basilar predominant fibrotic interstitial ?lung disease, increased honeycombing seen at the lung bases when ?compared with prior exam. Findings are consistent with UIP per ?consensus guidelines: Diagnosis of Idiopathic Pulmonary Fibrosis: An ?Official ATS/ERS/JRS/ALAT Clinical Practice Guideline. Am J Respir ?[place], Iss 5, [id number], Dec 06 2016. ?3. Aortic Atherosclerosis (ICD10-I70.0) and Emphysema (ICD10-J43.9). ? ?Please advise, thank you. ? ?

## 2021-08-06 ENCOUNTER — Telehealth: Payer: Self-pay | Admitting: Pulmonary Disease

## 2021-08-06 DIAGNOSIS — J9611 Chronic respiratory failure with hypoxia: Secondary | ICD-10-CM

## 2021-08-06 NOTE — Progress Notes (Signed)
CT shows maybe some worsening of scar or fibrosis. Small nodule noted. I recommend a CT chest to follow it up at 3 months. If he is ok to proceed with repeat CT, please let me know and I will order.

## 2021-08-07 ENCOUNTER — Other Ambulatory Visit: Payer: Self-pay

## 2021-08-07 DIAGNOSIS — C678 Malignant neoplasm of overlapping sites of bladder: Secondary | ICD-10-CM | POA: Diagnosis not present

## 2021-08-07 DIAGNOSIS — R911 Solitary pulmonary nodule: Secondary | ICD-10-CM

## 2021-08-07 NOTE — Progress Notes (Signed)
CT

## 2021-08-07 NOTE — Telephone Encounter (Signed)
Left message for patient to call back  

## 2021-08-09 ENCOUNTER — Ambulatory Visit (INDEPENDENT_AMBULATORY_CARE_PROVIDER_SITE_OTHER): Payer: Medicare HMO

## 2021-08-09 VITALS — Ht 68.0 in | Wt 175.0 lb

## 2021-08-09 DIAGNOSIS — Z0000 Encounter for general adult medical examination without abnormal findings: Secondary | ICD-10-CM | POA: Diagnosis not present

## 2021-08-09 NOTE — Patient Instructions (Signed)
Mr. Codrington , ?Thank you for taking time to come for your Medicare Wellness Visit. I appreciate your ongoing commitment to your health goals. Please review the following plan we discussed and let me know if I can assist you in the future.  ? ?Screening recommendations/referrals: ?Colonoscopy: Done 05/18/2018. No longer required.  ? ?Recommended yearly ophthalmology/optometry visit for glaucoma screening and checkup ?Recommended yearly dental visit for hygiene and checkup ? ?Vaccinations: ?Influenza vaccine: Done 03/07/2021 Repeat annually ? ?Pneumococcal vaccine: Done 12/29/2014 and 06/15/2013 ?Tdap vaccine: Done 01/11/2010 Repeat in 10 years ? ?Shingles vaccine: Discussed.   ?Covid-19: Done 03/07/21, 08/30/20, 01/26/2020, 07/21/2019, 06/30/2019. ? ?Advanced directives: Advance directive discussed with you today. Even though you declined this today, please call our office should you change your mind, and we can give you the proper paperwork for you to fill out. ? ? ?Conditions/risks identified: Aim for 6-8 glasses of water, and 5 servings of fruits and vegetables each day. ? ? ?Next appointment: Follow up in one year for your annual wellness visit. 2024 ? ?Preventive Care 26 Years and Older, Male ? ?Preventive care refers to lifestyle choices and visits with your health care provider that can promote health and wellness. ?What does preventive care include? ?A yearly physical exam. This is also called an annual well check. ?Dental exams once or twice a year. ?Routine eye exams. Ask your health care provider how often you should have your eyes checked. ?Personal lifestyle choices, including: ?Daily care of your teeth and gums. ?Regular physical activity. ?Eating a healthy diet. ?Avoiding tobacco and drug use. ?Limiting alcohol use. ?Practicing safe sex. ?Taking low doses of aspirin every day. ?Taking vitamin and mineral supplements as recommended by your health care provider. ?What happens during an annual well check? ?The  services and screenings done by your health care provider during your annual well check will depend on your age, overall health, lifestyle risk factors, and family history of disease. ?Counseling  ?Your health care provider may ask you questions about your: ?Alcohol use. ?Tobacco use. ?Drug use. ?Emotional well-being. ?Home and relationship well-being. ?Sexual activity. ?Eating habits. ?History of falls. ?Memory and ability to understand (cognition). ?Work and work Statistician. ?Screening  ?You may have the following tests or measurements: ?Height, weight, and BMI. ?Blood pressure. ?Lipid and cholesterol levels. These may be checked every 5 years, or more frequently if you are over 72 years old. ?Skin check. ?Lung cancer screening. You may have this screening every year starting at age 24 if you have a 30-pack-year history of smoking and currently smoke or have quit within the past 15 years. ?Fecal occult blood test (FOBT) of the stool. You may have this test every year starting at age 70. ?Flexible sigmoidoscopy or colonoscopy. You may have a sigmoidoscopy every 5 years or a colonoscopy every 10 years starting at age 62. ?Prostate cancer screening. Recommendations will vary depending on your family history and other risks. ?Hepatitis C blood test. ?Hepatitis B blood test. ?Sexually transmitted disease (STD) testing. ?Diabetes screening. This is done by checking your blood sugar (glucose) after you have not eaten for a while (fasting). You may have this done every 1-3 years. ?Abdominal aortic aneurysm (AAA) screening. You may need this if you are a current or former smoker. ?Osteoporosis. You may be screened starting at age 28 if you are at high risk. ?Talk with your health care provider about your test results, treatment options, and if necessary, the need for more tests. ?Vaccines  ?Your health care provider  may recommend certain vaccines, such as: ?Influenza vaccine. This is recommended every year. ?Tetanus,  diphtheria, and acellular pertussis (Tdap, Td) vaccine. You may need a Td booster every 10 years. ?Zoster vaccine. You may need this after age 32. ?Pneumococcal 13-valent conjugate (PCV13) vaccine. One dose is recommended after age 66. ?Pneumococcal polysaccharide (PPSV23) vaccine. One dose is recommended after age 75. ?Talk to your health care provider about which screenings and vaccines you need and how often you need them. ?This information is not intended to replace advice given to you by your health care provider. Make sure you discuss any questions you have with your health care provider. ?Document Released: 04/20/2015 Document Revised: 12/12/2015 Document Reviewed: 01/23/2015 ?Elsevier Interactive Patient Education ? 2017 [employer]. ? ?Fall Prevention in the Home ?Falls can cause injuries. They can happen to people of all ages. There are many things you can do to make your home safe and to help prevent falls. ?What can I do on the outside of my home? ?Regularly fix the edges of walkways and driveways and fix any cracks. ?Remove anything that might make you trip as you walk through a door, such as a raised step or threshold. ?Trim any bushes or trees on the path to your home. ?Use bright outdoor lighting. ?Clear any walking paths of anything that might make someone trip, such as rocks or tools. ?Regularly check to see if handrails are loose or broken. Make sure that both sides of any steps have handrails. ?Any raised decks and porches should have guardrails on the edges. ?Have any leaves, snow, or ice cleared regularly. ?Use sand or salt on walking paths during winter. ?Clean up any spills in your garage right away. This includes oil or grease spills. ?What can I do in the bathroom? ?Use night lights. ?Install grab bars by the toilet and in the tub and shower. Do not use towel bars as grab bars. ?Use non-skid mats or decals in the tub or shower. ?If you need to sit down in the shower, use a plastic,  non-slip stool. ?Keep the floor dry. Clean up any water that spills on the floor as soon as it happens. ?Remove soap buildup in the tub or shower regularly. ?Attach bath mats securely with double-sided non-slip rug tape. ?Do not have throw rugs and other things on the floor that can make you trip. ?What can I do in the bedroom? ?Use night lights. ?Make sure that you have a light by your bed that is easy to reach. ?Do not use any sheets or blankets that are too big for your bed. They should not hang down onto the floor. ?Have a firm chair that has side arms. You can use this for support while you get dressed. ?Do not have throw rugs and other things on the floor that can make you trip. ?What can I do in the kitchen? ?Clean up any spills right away. ?Avoid walking on wet floors. ?Keep items that you use a lot in easy-to-reach places. ?If you need to reach something above you, use a strong step stool that has a grab bar. ?Keep electrical cords out of the way. ?Do not use floor polish or wax that makes floors slippery. If you must use wax, use non-skid floor wax. ?Do not have throw rugs and other things on the floor that can make you trip. ?What can I do with my stairs? ?Do not leave any items on the stairs. ?Make sure that there are handrails on both sides  of the stairs and use them. Fix handrails that are broken or loose. Make sure that handrails are as long as the stairways. ?Check any carpeting to make sure that it is firmly attached to the stairs. Fix any carpet that is loose or worn. ?Avoid having throw rugs at the top or bottom of the stairs. If you do have throw rugs, attach them to the floor with carpet tape. ?Make sure that you have a light switch at the top of the stairs and the bottom of the stairs. If you do not have them, ask someone to add them for you. ?What else can I do to help prevent falls? ?Wear shoes that: ?Do not have high heels. ?Have rubber bottoms. ?Are comfortable and fit you well. ?Are closed  at the toe. Do not wear sandals. ?If you use a stepladder: ?Make sure that it is fully opened. Do not climb a closed stepladder. ?Make sure that both sides of the stepladder are locked into place. ?Ask someone to h

## 2021-08-09 NOTE — Progress Notes (Addendum)
? ?Subjective:  ? Bradley Johns is a 87 y.o. male who presents for Medicare Annual/Subsequent preventive examination. ?Virtual Visit via Telephone Note ? ?I connected with  Bradley Johns on 08/09/21 at  3:00 PM EDT by telephone and verified that I am speaking with the correct person using two identifiers. ? ?Location: ?Patient: HOME ?Provider: [facility] ?Persons participating in the virtual visit: patient/Nurse Health Advisor ?  ?I discussed the limitations, risks, security and privacy concerns of performing an evaluation and management service by telephone and the availability of in person appointments. The patient expressed understanding and agreed to proceed. ? ?Interactive audio and video telecommunications were attempted between this nurse and patient, however failed, due to patient having technical difficulties OR patient did not have access to video capability.  We continued and completed visit with audio only. ? ?Some vital signs may be absent or patient reported.  ? ?Chriss Driver, LPN ? ?Review of Systems    ? ?Cardiac Risk Factors include: advanced age (>8mn, >>8women);diabetes mellitus;hypertension;dyslipidemia;male gender;sedentary lifestyle;Other (see comment), Risk factor comments: oxygen, copd, osteoarthritits. ? ?   ?Objective:  ?  ?Today's Vitals  ? 08/09/21 1501  ?Weight: 175 lb (79.4 kg)  ?Height: '5\' 8"'$  (1.727 m)  ? ?Body mass index is 26.61 kg/m?. ? ? ?  08/09/2021  ?  3:19 PM 11/08/2020  ?  8:57 AM 11/07/2020  ?  6:12 PM 08/02/2020  ?  9:53 PM 08/02/2020  ? 11:28 AM 07/26/2020  ?  2:28 PM 04/01/2020  ?  2:00 PM  ?Advanced Directives  ?Does Patient Have a Medical Advance Directive? No No No No No No Yes  ?Type of Advance Directive       Living will  ?Does patient want to make changes to medical advance directive?       No - Patient declined  ?Would patient like information on creating a medical advance directive? No - Patient declined No - Patient declined No - Patient declined No - Patient  declined  Yes (Inpatient - patient defers creating a medical advance directive and declines information at this time)   ? ? ?Current Medications (verified) ?Outpatient Encounter Medications as of 08/09/2021  ?Medication Sig  ? albuterol (VENTOLIN HFA) 108 (90 Base) MCG/ACT inhaler Inhale 2 puffs into the lungs every 4 (four) hours as needed for wheezing or shortness of breath.  ? amLODipine (NORVASC) 10 MG tablet Take 1 tablet (10 mg total) by mouth daily.  ? atorvastatin (LIPITOR) 80 MG tablet Take 1 tablet (80 mg total) by mouth daily. (Patient taking differently: Take 80 mg by mouth every evening.)  ? carvedilol (COREG) 12.5 MG tablet Take 1 tablet by mouth in the morning and at bedtime.  ? cholecalciferol (VITAMIN D3) 25 MCG (1000 UNIT) tablet Take 1,000 Units by mouth daily.  ? cloNIDine (CATAPRES) 0.1 MG tablet Take 1 tablet (0.1 mg total) by mouth 2 (two) times daily.  ? colchicine 0.6 MG tablet Take 1 tablet (0.6 mg total) by mouth 2 (two) times daily as needed.  ? doxazosin (CARDURA) 4 MG tablet Take 1 tablet (4 mg total) by mouth at bedtime.  ? ferrous fumarate (HEMOCYTE - 106 MG FE) 325 (106 Fe) MG TABS tablet Take 1 tablet (106 mg of iron total) by mouth 2 (two) times daily.  ? furosemide (LASIX) 20 MG tablet Take 20 mg by mouth daily.  ? HYDROcodone-acetaminophen (NORCO/VICODIN) 5-325 MG tablet Take 1 tablet by mouth every 6 (six) hours as needed  for moderate pain.  ? ipratropium-albuterol (DUONEB) 0.5-2.5 (3) MG/3ML SOLN Take 3 mLs by nebulization in the morning and at bedtime.  ? ketoconazole (NIZORAL) 2 % cream Apply 1 application topically daily. (Patient taking differently: Apply 1 application. topically daily as needed (moisture around groin).)  ? losartan (COZAAR) 50 MG tablet Take 1 tablet by mouth daily.  ? MAGNESIUM PO Take 1 tablet by mouth daily.  ? Multiple Vitamin (MULTIVITAMIN) tablet Take 1 tablet by mouth daily.  ? OXYGEN Inhale 3 L into the lungs continuous.  ? pioglitazone (ACTOS) 45 MG  tablet Take 45 mg by mouth daily.  ? polyethylene glycol powder (GLYCOLAX/MIRALAX) 17 GM/SCOOP powder Take by mouth.  ? saline (AYR) GEL Place 1 application into both nostrils every 12 (twelve) hours.  ? senna (SENOKOT) 8.6 MG TABS tablet Take 1 tablet (8.6 mg total) by mouth 2 (two) times daily.  ? sodium chloride (OCEAN) 0.65 % SOLN nasal spray Place 1 spray into both nostrils as needed for congestion.  ? [DISCONTINUED] doxazosin (CARDURA) 2 MG tablet Take 2 mg by mouth daily.  ? [DISCONTINUED] polyethylene glycol (MIRALAX / GLYCOLAX) 17 g packet Take 17 g by mouth daily.  ? ?No facility-administered encounter medications on file as of 08/09/2021.  ? ? ?Allergies (verified) ?Patient has no known allergies.  ? ?History: ?Past Medical History:  ?Diagnosis Date  ? Anemia   ? iron  ? Arthritis   ? Benign prostatic hypertrophy   ? COPD (chronic obstructive pulmonary disease) ([place])   ? 24/7 O2  ? Diabetes mellitus   ? per Dr Posey Pronto  ? Epidermal cyst of face   ? left lower lateral cheek  ? Gout   ? History of colon polyps   ? History of kidney stones 1980s  ? Hyperlipidemia   ? Hypertension   ? Kidney disease   ? Pulmonary fibrosis ([place]) 2014  ? Sleep apnea   ? 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 ?Past Surgical History:  ?Procedure Laterality Date  ? COLONOSCOPY WITH PROPOFOL N/A 05/18/2018  ? Procedure: COLONOSCOPY WITH PROPOFOL;  Surgeon: Carol Ada, MD;  Location: [facility];  Service: Endoscopy;  Laterality: N/A;  ? POLYPECTOMY  05/18/2018  ? Procedure: POLYPECTOMY;  Surgeon: Carol Ada, MD;  Location: [facility];  Service: Endoscopy;;  ? TOE SURGERY    ? TRANSURETHRAL RESECTION OF BLADDER TUMOR WITH MITOMYCIN-C N/A 08/02/2020  ? Procedure: TRANSURETHRAL RESECTION OF BLADDER TUMOR;  Surgeon: Franchot Gallo, MD;  Location: [facility];  Service: Urology;  Laterality: N/A;  1 HR  ? TRANSURETHRAL RESECTION OF BLADDER TUMOR WITH MITOMYCIN-C N/A 11/08/2020  ? Procedure: TRANSURETHRAL RESECTION OF BLADDER TUMOR;   Surgeon: Franchot Gallo, MD;  Location: [facility];  Service: Urology;  Laterality: N/A;  ? ?Family History  ?Problem Relation Age of Onset  ? Liver disease Mother   ? Coronary artery disease Father 71  ? Diabetes Sister   ? Cancer Brother   ?     type?  ? Hypertension Neg Hx   ? Hyperlipidemia Neg Hx   ? Heart attack Neg Hx   ? Prostate cancer Neg Hx   ? Colon cancer Neg Hx   ? ?Social History  ? ?Socioeconomic History  ? Marital status: Married  ?  Spouse name: Not on file  ? Number of children: 2  ? Years of education: Not on file  ? Highest education level: Not on file  ?Occupational History  ? Occupation: retired  ?Tobacco Use  ? Smoking  status: Former  ?  Packs/day: 1.50  ?  Years: 40.00  ?  Pack years: 60.00  ?  Types: Cigarettes  ?  Quit date: 04/07/1996  ?  Years since quitting: 25.3  ? Smokeless tobacco: Never  ?Vaping Use  ? Vaping Use: Never used  ?Substance and Sexual Activity  ? Alcohol use: Yes  ?  Comment: occassional beer  ? Drug use: No  ? Sexual activity: Yes  ?Other Topics Concern  ? Not on file  ?Social History Narrative  ? Lives w/ wife  ? ?Social Determinants of Health  ? ?Financial Resource Strain: Low Risk   ? Difficulty of Paying Living Expenses: Not hard at all  ?Food Insecurity: No Food Insecurity  ? Worried About Charity fundraiser in the Last Year: Never true  ? Ran Out of Food in the Last Year: Never true  ?Transportation Needs: No Transportation Needs  ? Lack of Transportation (Medical): No  ? Lack of Transportation (Non-Medical): No  ?Physical Activity: Inactive  ? Days of Exercise per Week: 0 days  ? Minutes of Exercise per Session: 0 min  ?Stress: No Stress Concern Present  ? Feeling of Stress : Not at all  ?Social Connections: Socially Integrated  ? Frequency of Communication with Friends and Family: More than three times a week  ? Frequency of Social Gatherings with Friends and Family: More than three times a week  ? Attends Religious Services: More than 4 times per year  ? Active  Member of Clubs or Organizations: Yes  ? Attends Archivist Meetings: More than 4 times per year  ? Marital Status: Married  ? ? ?Tobacco Counseling ?Counseling given: Not Answered ? ? ?[facility]

## 2021-08-13 ENCOUNTER — Telehealth: Payer: Self-pay | Admitting: Internal Medicine

## 2021-08-13 MED ORDER — COLCHICINE 0.6 MG PO TABS: 0.6000 mg | ORAL_TABLET | Freq: Two times a day (BID) | ORAL | 0 refills | Status: AC | PRN

## 2021-08-13 NOTE — Telephone Encounter (Signed)
Rx sent to [facility].  

## 2021-08-13 NOTE — Telephone Encounter (Signed)
Pt wanted his rx sent through [facility]. Please advise.  ? ?colchicine 0.6 MG tablet  ? ? ? ?

## 2021-08-14 ENCOUNTER — Other Ambulatory Visit: Payer: Self-pay

## 2021-08-14 DIAGNOSIS — J439 Emphysema, unspecified: Secondary | ICD-10-CM

## 2021-08-19 NOTE — Telephone Encounter (Signed)
Received fax from [facility] this morning regarding clarification on script. I have answered this and faxed back.  ?

## 2021-08-19 NOTE — Telephone Encounter (Signed)
Pt states [facility] is needing colchicine resent to [facility]. They can reach the pharm at [phone]. Please advise.  ?

## 2021-08-22 DIAGNOSIS — I10 Essential (primary) hypertension: Secondary | ICD-10-CM | POA: Diagnosis not present

## 2021-08-22 DIAGNOSIS — E1169 Type 2 diabetes mellitus with other specified complication: Secondary | ICD-10-CM | POA: Diagnosis not present

## 2021-08-22 DIAGNOSIS — C679 Malignant neoplasm of bladder, unspecified: Secondary | ICD-10-CM | POA: Diagnosis not present

## 2021-08-22 DIAGNOSIS — N183 Chronic kidney disease, stage 3 unspecified: Secondary | ICD-10-CM | POA: Diagnosis not present

## 2021-08-22 DIAGNOSIS — J9611 Chronic respiratory failure with hypoxia: Secondary | ICD-10-CM | POA: Diagnosis not present

## 2021-08-22 DIAGNOSIS — I509 Heart failure, unspecified: Secondary | ICD-10-CM | POA: Diagnosis not present

## 2021-08-22 DIAGNOSIS — D649 Anemia, unspecified: Secondary | ICD-10-CM | POA: Diagnosis not present

## 2021-08-22 DIAGNOSIS — J449 Chronic obstructive pulmonary disease, unspecified: Secondary | ICD-10-CM | POA: Diagnosis not present

## 2021-09-11 DIAGNOSIS — J449 Chronic obstructive pulmonary disease, unspecified: Secondary | ICD-10-CM | POA: Diagnosis not present

## 2021-09-11 DIAGNOSIS — J8410 Pulmonary fibrosis, unspecified: Secondary | ICD-10-CM | POA: Diagnosis not present

## 2021-09-11 DIAGNOSIS — J9611 Chronic respiratory failure with hypoxia: Secondary | ICD-10-CM | POA: Diagnosis not present

## 2021-09-11 DIAGNOSIS — R0602 Shortness of breath: Secondary | ICD-10-CM | POA: Diagnosis not present

## 2021-09-11 DIAGNOSIS — R0902 Hypoxemia: Secondary | ICD-10-CM | POA: Diagnosis not present

## 2021-09-18 DIAGNOSIS — Z5111 Encounter for antineoplastic chemotherapy: Secondary | ICD-10-CM | POA: Diagnosis not present

## 2021-09-18 DIAGNOSIS — C678 Malignant neoplasm of overlapping sites of bladder: Secondary | ICD-10-CM | POA: Diagnosis not present

## 2021-09-25 DIAGNOSIS — Z5111 Encounter for antineoplastic chemotherapy: Secondary | ICD-10-CM | POA: Diagnosis not present

## 2021-09-25 DIAGNOSIS — C678 Malignant neoplasm of overlapping sites of bladder: Secondary | ICD-10-CM | POA: Diagnosis not present

## 2021-10-02 NOTE — Telephone Encounter (Signed)
We had previously ordered an "oxy mask" for this same issue a few weeks ago. Can you check to see that the mask was delivered?

## 2021-10-02 NOTE — Telephone Encounter (Signed)
Called and spoke with [facility] and they are needing an order for the face mack since he switched from [place]. Order placed and will have [place] sign. Nothing further

## 2021-10-02 NOTE — Telephone Encounter (Signed)
Called and spoke with pt who is requesting to have an oxygen mask for him to wear at night as he feels like the nasal cannula are not working for him to be able to get enough oxygen while sleeping.  Dr. Silas Flood, please advise on this for pt.

## 2021-10-02 NOTE — Telephone Encounter (Signed)
Dr. Silas Flood documented in CT  image of results and pt was made aware of the results. Nothing further needed.

## 2021-10-09 DIAGNOSIS — Z5111 Encounter for antineoplastic chemotherapy: Secondary | ICD-10-CM | POA: Diagnosis not present

## 2021-10-09 DIAGNOSIS — C678 Malignant neoplasm of overlapping sites of bladder: Secondary | ICD-10-CM | POA: Diagnosis not present

## 2021-10-11 DIAGNOSIS — R0902 Hypoxemia: Secondary | ICD-10-CM | POA: Diagnosis not present

## 2021-10-11 DIAGNOSIS — R0602 Shortness of breath: Secondary | ICD-10-CM | POA: Diagnosis not present

## 2021-10-11 DIAGNOSIS — J449 Chronic obstructive pulmonary disease, unspecified: Secondary | ICD-10-CM | POA: Diagnosis not present

## 2021-10-11 DIAGNOSIS — J8410 Pulmonary fibrosis, unspecified: Secondary | ICD-10-CM | POA: Diagnosis not present

## 2021-10-17 ENCOUNTER — Ambulatory Visit: Payer: Medicare HMO | Admitting: Pulmonary Disease

## 2021-10-17 ENCOUNTER — Encounter: Payer: Self-pay | Admitting: Pulmonary Disease

## 2021-10-17 VITALS — BP 124/64 | HR 66 | Temp 98.4°F | Ht 68.0 in | Wt 176.4 lb

## 2021-10-17 DIAGNOSIS — J9611 Chronic respiratory failure with hypoxia: Secondary | ICD-10-CM

## 2021-10-17 NOTE — Patient Instructions (Signed)
Nice to see you again  The medication to treat fibrosis or scarring in the lung is called Ofev, please do some research and read about this and we will discuss it at next visit.  You have a CT scan scheduled August 16 to follow-up a small nodule on the CT scan performed back in April.  Hopefully the nodule is getting smaller and there is nothing else we need to do.  However, if the nodule is getting bigger we will need to discuss next steps, things we may need to do to further investigate this.  Return to clinic on August 24 to discuss results of CT scan and the medication above.

## 2021-10-21 ENCOUNTER — Telehealth: Payer: Self-pay | Admitting: Pulmonary Disease

## 2021-10-21 DIAGNOSIS — J9611 Chronic respiratory failure with hypoxia: Secondary | ICD-10-CM

## 2021-10-21 NOTE — Telephone Encounter (Signed)
Called patient and he states that he is needing a new concentrator. He states that [facility] told him he needed an order. Order placed. Nothing further needed

## 2021-10-24 ENCOUNTER — Telehealth: Payer: Self-pay | Admitting: Pulmonary Disease

## 2021-10-24 NOTE — Telephone Encounter (Signed)
Called patient and he states that his insurance no longer accepts [facility]. Can we send order for concentrator to [facility].   Or do I need a new order to change everything over to [facility]?  Please advise

## 2021-10-24 NOTE — Telephone Encounter (Signed)
Patient called back and states he has not heard anything- informed patient that an order for a concentrator was placed. Patient states he will reach out to [facility].

## 2021-10-24 NOTE — Telephone Encounter (Signed)
Order has been sent to [facility]

## 2021-10-26 NOTE — Progress Notes (Signed)
$'@Patient'H$  ID: Bradley Johns, male    DOB: 25-Aug-1934, 87 y.o.   MRN: [id number]  Chief Complaint  Patient presents with   Follow-up    Pt is here for follow up for ILD. Pt states with exerion he gets SOB. Pt states that this feeling has just been progessing. Pt states other then that he is been doing well.     Referring provider: Colon Branch, MD  HPI:   87 y.o. with COPD and possible pulmonary fibrosis whom we are seeing in follow up.   Overall doing well.  Nebulized, albuterol helpful.  Using pulsed oxygen via POC.  They like the smaller device.  Up to be more active.  He thinks overall his breathing is slowly getting worse.  Dyspnea bit worse.  Discussed at length CT scan demonstrated likely IPF.  Discussed at length once again role of antifibrotic's.  He and family and we will review and discuss, do some research.  Discussed nodule seen on last CT scan with upcoming follow-up in August.  HPI at initial visit: Patient states he is doing okay.  He has baseline dyspnea on exertion.  He thinks is gradual worse over time but not much change over the last several months.  Certainly worse over the last couple months although he thinks he slowly getting stamina back.  He was hospitalized on Christmas Day 2021.  He had a fall at home.  Hemoglobin was low.  He received blood.  VQ scan was negative for PE.  He got much worse couple days later and was given Lasix and Solu-Medrol for worsening hypoxemia.  He has brief with described as bradycardic PEA arrest and received CPR and chemical resuscitation.  He was not intubated.  Unclear if he truly lost a pulse.  He was monitored in the ICU after this.  He suddenly improved and was discharged home.  Had a rehab stent.  He is now back at home.  Again dyspnea worse than prior.  Not much worse overall from several months ago.  He thinks he slowly get the strength back from hospitalization.  He is using oxygen as prescribed.  Using around 4 L mostly.  He is on  3 L today.  No recent upper respiratory infections in the last month.  Hemoglobin most recently last month was greater than 10.  His oxygen saturation today is 95% on supplemental O2.  Reviewed chest images most recent chest x-ray 12/28 2021 which shows interstitial changes worsened from admission concerning for pulmonary edema on my interpretation.  Most recent cross-sectional imaging 06/09/2018 reviewed which shows significant emphysematous changes in the upper lobes, paraseptal and peripheral cystic changes throughout consistent with emphysema versus pulmonary fibrosis on my interpretation.  Most recent TTE 03/2020 reviewed with normal EF, reportedly normal diastolic pressures, dilated LA, MVR, mild AS, elevated RA pressures, RA size normal, RV size and function normal, moderately elevated PASP.  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, former smoker, quit many years ago, about 60-pack-year history  Questionaires / Pulmonary Flowsheets:   ACT:      No data to display           MMRC: mMRC Dyspnea Scale mMRC Score  06/14/2020  3:12  PM 3    Epworth:      No data to display           Tests:   FENO:  No results found for: "NITRICOXIDE"  PFT:    Latest Ref Rng & Units 04/21/2014    9:38 AM  PFT Results  FVC-Pre L 1.99   FVC-Predicted Pre % 59   FVC-Post L 2.22   FVC-Predicted Post % 66   Pre FEV1/FVC % % 74   Post FEV1/FCV % % 74   FEV1-Pre L 1.47   FEV1-Predicted Pre % 60   FEV1-Post L 1.65   DLCO uncorrected ml/min/mmHg 8.10   DLCO UNC% % 27   DLVA Predicted % 51   TLC L 4.21   TLC % Predicted % 64   RV % Predicted % 81   PFTs reviewed on my  interpretation revealed moderate restriction with reduced DLCO, normal FEV1/FVC ratio, FEV1 and FVC both reduced, DLCO severely reduced  WALK:     12/28/2012    4:49 PM 12/28/2012    4:43 PM 12/14/2012    4:11 PM 12/14/2012    4:10 PM  SIX MIN WALK  Supplimental Oxygen during Test? (L/min) Yes Yes Yes No  O2 Flow Rate 3 L/min 2 L/min 3 L/min   Type Continuous Continuous Continuous   Tech Comments:  Pt was placed on 3 liters O2 cont flowand then recovered 91%  Pt desaturated 1 st lap 83% 2 liters. Pt was placed on 3 liters cont flow and O2 sats was 91%. pt did 2 laps with 3 liters cont flow and sats maintained 95%     Imaging: Personally reviewed and as per EMR discussion in this note  Lab Results: Personally reviewed CBC    Component Value Date/Time   WBC 6.9 11/09/2020 0452   RBC 3.33 (L) 11/09/2020 0452   HGB 10.6 (L) 11/09/2020 0452   HCT 33.4 (L) 11/09/2020 0452   PLT 130 (L) 11/09/2020 0452   MCV 100.3 (H) 11/09/2020 0452   MCH 31.8 11/09/2020 0452   MCHC 31.7 11/09/2020 0452   RDW 14.6 11/09/2020 0452   LYMPHSABS 0.8 08/30/2020 1343   MONOABS 0.5 08/30/2020 1343   EOSABS 0.3 08/30/2020 1343   BASOSABS 0.1 08/30/2020 1343    BMET    Component Value Date/Time   NA 136 03/07/2021 1354   K 4.6 03/07/2021 1354   CL 100 03/07/2021 1354   CO2 31 03/07/2021 1354   GLUCOSE 86 03/07/2021 1354   GLUCOSE 176 07/12/2008 0000   BUN 32 (H) 03/07/2021 1354   CREATININE 1.88 (H) 03/07/2021 1354   CREATININE 1.36 (H) 12/21/2015 1430   CALCIUM 9.1 03/07/2021 1354   GFRNONAA 36 (L) 11/09/2020 0452   GFRAA >60 05/25/2018 0310    BNP    Component Value Date/Time   BNP 471.6 (H) 08/05/2020 1403    ProBNP    Component Value Date/Time   PROBNP 30.0 12/13/2012 1615    Specialty Problems       Pulmonary Problems   COPD with emphysema, on O2    FEV1 67%       Sleep apnea           Dyspnea on exertion   Pulmonary fibrosis (HCC)    Favor IPF DLCO 32%       Chronic respiratory failure with hypoxia (HCC)   Hypoxia   Acute on chronic respiratory failure with hypoxia (HCC)   SOB (shortness of breath)  No Known Allergies  Immunization History  Administered Date(s) Administered   Fluad Quad(high Dose 65+) 12/31/2018, 05/07/2020, 03/07/2021   Influenza Split 03/18/2011   Influenza Whole 01/10/2009, 01/11/2010   Influenza, High Dose Seasonal PF 02/14/2013, 12/29/2014, 12/21/2015, 03/10/2017, 01/11/2018   Influenza, Seasonal, Injecte, Preservative Fre 04/06/2012   Influenza,inj,Quad PF,6+ Mos 02/16/2014   PFIZER Comirnaty(Gray Top)Covid-19 Tri-Sucrose Vaccine 08/30/2020   PFIZER(Purple Top)SARS-COV-2 Vaccination 06/30/2019, 07/21/2019, 01/26/2020, 03/07/2021   Pfizer Covid-19 Vaccine Bivalent Booster 73yr & up 03/07/2021   Pneumococcal Conjugate-13 12/29/2014   Pneumococcal Polysaccharide-23 10/20/1999, 07/11/2008, 06/15/2013   Td 04/10/1999, 01/11/2010    Past Medical History:  Diagnosis Date   Anemia    iron   Arthritis    Benign prostatic hypertrophy    COPD (chronic obstructive pulmonary disease) (HCC)    24/7 O2   Diabetes mellitus    per Dr PPosey Pronto  Epidermal cyst of face    left lower lateral cheek   Gout    History of colon polyps    History of kidney stones 1980s   Hyperlipidemia    Hypertension    Kidney disease    Pulmonary fibrosis ([facility] 2014   Sleep apnea    Urothelial carcinoma [place] 05/24/2020    Tobacco History: Social History   Tobacco Use  Smoking Status Former   Packs/day: 1.50   Years: 40.00   Total pack years: 60.00   Types: Cigarettes   Quit date: 04/07/1996   Years since quitting: 25.5  Smokeless Tobacco Never   Counseling given: Not Answered    Continue to not smoke  Outpatient Encounter Medications as of 10/17/2021  Medication Sig   albuterol (VENTOLIN HFA) 108 (90 Base) MCG/ACT inhaler Inhale 2 puffs into the lungs every 4 (four) hours as needed for wheezing or shortness of breath.    amLODipine (NORVASC) 10 MG tablet Take 1 tablet (10 mg total) by mouth daily.   atorvastatin (LIPITOR) 80 MG tablet Take 1 tablet (80 mg total) by mouth daily. (Patient taking differently: Take 80 mg by mouth every evening.)   carvedilol (COREG) 12.5 MG tablet Take 1 tablet by mouth in the morning and at bedtime.   cholecalciferol (VITAMIN D3) 25 MCG (1000 UNIT) tablet Take 1,000 Units by mouth daily.   cloNIDine (CATAPRES) 0.1 MG tablet Take 1 tablet (0.1 mg total) by mouth 2 (two) times daily.   colchicine 0.6 MG tablet Take 1 tablet (0.6 mg total) by mouth 2 (two) times daily as needed.   doxazosin (CARDURA) 4 MG tablet Take 1 tablet (4 mg total) by mouth at bedtime.   ferrous fumarate (HEMOCYTE - 106 MG FE) 325 (106 Fe) MG TABS tablet Take 1 tablet (106 mg of iron total) by mouth 2 (two) times daily.   furosemide (LASIX) 20 MG tablet Take 20 mg by mouth daily.   HYDROcodone-acetaminophen (NORCO/VICODIN) 5-325 MG tablet Take 1 tablet by mouth every 6 (six) hours as needed for moderate pain.   ipratropium-albuterol (DUONEB) 0.5-2.5 (3) MG/3ML SOLN Take 3 mLs by nebulization in the morning and at bedtime.   ketoconazole (NIZORAL) 2 % cream Apply 1 application topically daily. (Patient taking differently: Apply 1 application  topically daily as needed (moisture around groin).)   losartan (COZAAR) 50 MG tablet Take 1 tablet by mouth daily.   MAGNESIUM PO Take 1 tablet by mouth daily.   Multiple Vitamin (MULTIVITAMIN) tablet Take 1 tablet by mouth daily.   OXYGEN Inhale 3 L into the lungs continuous.   pioglitazone (ACTOS)  45 MG tablet Take 45 mg by mouth daily.   polyethylene glycol powder (GLYCOLAX/MIRALAX) 17 GM/SCOOP powder Take by mouth.   saline (AYR) GEL Place 1 application into both nostrils every 12 (twelve) hours.   senna (SENOKOT) 8.6 MG TABS tablet Take 1 tablet (8.6 mg total) by mouth 2 (two) times daily.   sodium chloride (OCEAN) 0.65 % SOLN nasal spray Place 1 spray into both  nostrils as needed for congestion.   No facility-administered encounter medications on file as of 10/17/2021.     Review of Systems  Review of Systems  n/a Physical Exam  BP 124/64 (BP Location: Left Arm, Patient Position: Sitting, Cuff Size: Normal)   Pulse 66   Temp 98.4 F (36.9 C) (Oral)   Ht '5\' 8"'$  (1.727 m)   Wt 176 lb 6.4 oz (80 kg)   SpO2 91%   BMI 26.82 kg/m   Wt Readings from Last 5 Encounters:  10/17/21 176 lb 6.4 oz (80 kg)  08/09/21 175 lb (79.4 kg)  08/01/21 175 lb 6 oz (79.5 kg)  07/25/21 165 lb (74.8 kg)  03/14/21 182 lb 12.8 oz (82.9 kg)    BMI Readings from Last 5 Encounters:  10/17/21 26.82 kg/m  08/09/21 26.61 kg/m  08/01/21 26.67 kg/m  07/25/21 25.09 kg/m  03/14/21 27.79 kg/m     Physical Exam General: Thin, sitting in exam chair Neck: Supple, no JVP Eyes: EOMI, no icterus Pulmonary: Distant sounds throughout,, crackles bilateral bases, NWOB on [place] Cardiovascular: Regular rate and rhythm, no murmur   Assessment & Plan:   Dyspnea exertion: Multifactorial, related to COPD, deconditioning, likely pulmonary fibrosis although do query if this is just septal and peripheral emphysematous changes, significant valvular disease, diastolic dysfunction, left atrial hypertension.   COPD: Presumed based on emphysema, PFTs showed restrictive pattern although flow volume curve consistent with obstruction.  Rare bronchodilator use.  Instructed to use DuoNebs twice daily as he did not follow through with this last visit.  If helpful, would plan to escalate bronchodilator therapy.  Hypoxemic respiratory failure, chronic: related to emphysema, COPD. Had high BP while admitted and improved with diuretics - suspect flash pulmonary edema. Chronic LA dilation so pre-disposed to fluid accumulation. Advised to continue lasix started during admission.  Tolerating POC device well.  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  Discussed role and rationale for antifibrotic's.  He is not  convinced, not ready to start any medicines.  Provided name of medications to do his own research.  We will reassess in a few weeks.  I think that likely the medicines would be reasonable to try.  He is relatively active.  Lung nodule: 8.5 mm with enlarging solid portion.  Concern for lung cancer.  Discussed at last visit CT scan versus PET scan.  Elect repeat CT scan.  He is a poor candidate for surgical resection.  If enlarging consider biopsy versus empiric SBRT.  Repeat CT scan due in the coming weeks.  We will discuss options after results.  Return in about 6 weeks (around 11/28/2021).   Lanier Clam, MD 10/26/2021

## 2021-10-28 NOTE — Telephone Encounter (Signed)
I sent a message to [employer] to let them know. Nothing further needed at this time.

## 2021-10-30 ENCOUNTER — Telehealth: Payer: Self-pay | Admitting: Pulmonary Disease

## 2021-10-30 NOTE — Telephone Encounter (Signed)
Last office note faxed over to [place]. Nothing further needed.

## 2021-10-31 DIAGNOSIS — H26493 Other secondary cataract, bilateral: Secondary | ICD-10-CM | POA: Diagnosis not present

## 2021-10-31 DIAGNOSIS — H35033 Hypertensive retinopathy, bilateral: Secondary | ICD-10-CM | POA: Diagnosis not present

## 2021-10-31 DIAGNOSIS — E119 Type 2 diabetes mellitus without complications: Secondary | ICD-10-CM | POA: Diagnosis not present

## 2021-10-31 DIAGNOSIS — H43813 Vitreous degeneration, bilateral: Secondary | ICD-10-CM | POA: Diagnosis not present

## 2021-10-31 LAB — HM DIABETES EYE EXAM

## 2021-11-01 ENCOUNTER — Encounter: Payer: Self-pay | Admitting: Internal Medicine

## 2021-11-04 ENCOUNTER — Telehealth: Payer: Self-pay | Admitting: Pulmonary Disease

## 2021-11-04 NOTE — Telephone Encounter (Signed)
Can we see what DME company is approved by this patients insurance. He states that he is getting so many calls from different DME companies and is so confused.   Please advise

## 2021-11-05 NOTE — Telephone Encounter (Signed)
Called and informed patient that we did received the fax from [facility] and that [place] will sign and fill it out today. Will fax it out to [facility] today. Nothing further needed

## 2021-11-06 ENCOUNTER — Telehealth: Payer: Self-pay | Admitting: Pulmonary Disease

## 2021-11-06 NOTE — Telephone Encounter (Signed)
We just received fax yesterday and nurse has not had a chance to send it off yet to be faxed. Will fax today. Nothing further needed

## 2021-11-14 ENCOUNTER — Encounter: Payer: Self-pay | Admitting: Internal Medicine

## 2021-11-14 ENCOUNTER — Ambulatory Visit (INDEPENDENT_AMBULATORY_CARE_PROVIDER_SITE_OTHER): Payer: Medicare HMO | Admitting: Internal Medicine

## 2021-11-14 VITALS — BP 132/74 | HR 66 | Temp 97.6°F | Resp 18 | Ht 68.0 in | Wt 173.2 lb

## 2021-11-14 DIAGNOSIS — D62 Acute posthemorrhagic anemia: Secondary | ICD-10-CM

## 2021-11-14 DIAGNOSIS — N189 Chronic kidney disease, unspecified: Secondary | ICD-10-CM | POA: Diagnosis not present

## 2021-11-14 DIAGNOSIS — Z23 Encounter for immunization: Secondary | ICD-10-CM | POA: Diagnosis not present

## 2021-11-14 DIAGNOSIS — E785 Hyperlipidemia, unspecified: Secondary | ICD-10-CM | POA: Diagnosis not present

## 2021-11-14 DIAGNOSIS — Z79899 Other long term (current) drug therapy: Secondary | ICD-10-CM | POA: Diagnosis not present

## 2021-11-14 LAB — LIPID PANEL
Cholesterol: 151 mg/dL (ref 0–200)
HDL: 50.2 mg/dL (ref >39.00)
LDL Cholesterol: 85 mg/dL (ref 0–99)
NonHDL: 100.42
Total CHOL/HDL Ratio: 3
Triglycerides: 79 mg/dL (ref 0.0–149.0)
VLDL: 15.8 mg/dL (ref 0.0–40.0)

## 2021-11-14 LAB — CBC WITH DIFFERENTIAL/PLATELET
Basophils Absolute: 0 10*3/uL (ref 0.0–0.1)
Basophils Relative: 0.3 % (ref 0.0–3.0)
Eosinophils Absolute: 0.2 10*3/uL (ref 0.0–0.7)
Eosinophils Relative: 4.6 % (ref 0.0–5.0)
HCT: 36.2 % — ABNORMAL LOW (ref 39.0–52.0)
Hemoglobin: 11.6 g/dL — ABNORMAL LOW (ref 13.0–17.0)
Lymphocytes Relative: 24.8 % (ref 12.0–46.0)
Lymphs Abs: 1.3 10*3/uL (ref 0.7–4.0)
MCHC: 32 g/dL (ref 30.0–36.0)
MCV: 97.6 fl (ref 78.0–100.0)
Monocytes Absolute: 0.6 10*3/uL (ref 0.1–1.0)
Monocytes Relative: 11.4 % (ref 3.0–12.0)
Neutro Abs: 3.1 10*3/uL (ref 1.4–7.7)
Neutrophils Relative %: 58.9 % (ref 43.0–77.0)
Platelets: 188 10*3/uL (ref 150.0–400.0)
RBC: 3.71 Mil/uL — ABNORMAL LOW (ref 4.22–5.81)
RDW: 15.1 % (ref 11.5–15.5)
WBC: 5.3 10*3/uL (ref 4.0–10.5)

## 2021-11-14 LAB — BASIC METABOLIC PANEL
BUN: 35 mg/dL — ABNORMAL HIGH (ref 6–23)
CO2: 30 mEq/L (ref 19–32)
Calcium: 9.3 mg/dL (ref 8.4–10.5)
Chloride: 101 mEq/L (ref 96–112)
Creatinine, Ser: 2.28 mg/dL — ABNORMAL HIGH (ref 0.40–1.50)
GFR: 25.6 mL/min — ABNORMAL LOW (ref >60.00)
Glucose, Bld: 140 mg/dL — ABNORMAL HIGH (ref 70–99)
Potassium: 5 mEq/L (ref 3.5–5.1)
Sodium: 139 mEq/L (ref 135–145)

## 2021-11-14 LAB — FERRITIN: Ferritin: 86.6 ng/mL (ref 22.0–322.0)

## 2021-11-14 LAB — IRON: Iron: 83 ug/dL (ref 42–165)

## 2021-11-14 MED ORDER — TETANUS-DIPHTH-ACELL PERTUSSIS 5-2.5-18.5 LF-MCG/0.5 IM SUSP: 0.5000 mL | Freq: Once | INTRAMUSCULAR | 0 refills | Status: AC

## 2021-11-14 NOTE — Patient Instructions (Addendum)
Recommend to proceed with the following vaccines at your pharmacy:  Covid booster (bivalent) Tdap (tetanus) Flu shot this fall  Take your medicines according to the list provided.   Check the  blood pressure regularly BP GOAL is between 110/65 and  135/85. If it is consistently higher or lower, let me know    GO TO THE LAB : Get the blood work     [place], [place] back for   a physical exam in 4 months

## 2021-11-14 NOTE — Assessment & Plan Note (Signed)
DM: Per Endo RTM:[id number] on clonidine, Cardura, losartan, carvedilol, amlodipine. He was taking Lasix but  ran out of it ,  BP remains well-controlled and he has no edema.  We agreed not to take furosemide and discussed with nephrology. CKD: See above, has an appointment to see nephrology in few weeks.  Will check a BMP. Mild anemia: No recent labs, check a CBC, iron and ferritin. High cholesterol: On atorvastatin, check FLP. Pulmonary: Saw pulmonary 10/17/2021, DOE felt to be multifactorial. ILD felt to be likely IPF, patient was not ready for antifibrotic's. Lung nodule evaluation: CT in the near future.  They noted to be a poor candidate for a resection if that is a malignancy Polypharmacy: Has 22 items  on his medication list,  I reviewed every single medication with him and explain what it is for.  We deleted  not needed meds  The patient and his family member tells me that iron, magnesium and vitamin D are managed by nephrology. Preventive care: PNM 20 today, see AVS RTC 4 months CPX

## 2021-11-14 NOTE — Progress Notes (Signed)
Subjective:    Patient ID: Bradley Johns, male    DOB: 14-May-1934, 87 y.o.   MRN: [id number]  DOS:  11/14/2021 Type of visit - description: f/u  Since the last office visit is doing okay and has no major symptoms or concerns. He is confused about his medications and requests help   Review of Systems See above   Past Medical History:  Diagnosis Date   Anemia    iron   Arthritis    Benign prostatic hypertrophy    COPD (chronic obstructive pulmonary disease) (HCC)    24/7 O2   Diabetes mellitus    per Dr Posey Pronto   Epidermal cyst of face    left lower lateral cheek   Gout    History of colon polyps    History of kidney stones 1980s   Hyperlipidemia    Hypertension    Kidney disease    Pulmonary fibrosis ([place]) 2014   Sleep apnea    Urothelial carcinoma ([place]) 05/24/2020    Past Surgical History:  Procedure Laterality Date   COLONOSCOPY WITH PROPOFOL N/A 05/18/2018   Procedure: COLONOSCOPY WITH PROPOFOL;  Surgeon: Carol Ada, MD;  Location: [facility];  Service: Endoscopy;  Laterality: N/A;   POLYPECTOMY  05/18/2018   Procedure: POLYPECTOMY;  Surgeon: Carol Ada, MD;  Location: [facility];  Service: Endoscopy;;   TOE SURGERY     TRANSURETHRAL RESECTION OF BLADDER TUMOR WITH MITOMYCIN-C N/A 08/02/2020   Procedure: TRANSURETHRAL RESECTION OF BLADDER TUMOR;  Surgeon: Franchot Gallo, MD;  Location: [facility];  Service: Urology;  Laterality: N/A;  1 HR   TRANSURETHRAL RESECTION OF BLADDER TUMOR WITH MITOMYCIN-C N/A 11/08/2020   Procedure: TRANSURETHRAL RESECTION OF BLADDER TUMOR;  Surgeon: Franchot Gallo, MD;  Location: [facility];  Service: Urology;  Laterality: N/A;    Current Outpatient Medications  Medication Instructions   albuterol (VENTOLIN HFA) 108 (90 Base) MCG/ACT inhaler 2 puffs, Inhalation, Every 4 hours PRN   amLODipine (NORVASC) 10 mg, Oral, Daily   atorvastatin (LIPITOR) 80 mg, Oral, Daily   carvedilol (COREG) 12.5 MG tablet 1 tablet, Oral, 2 times daily    cholecalciferol (VITAMIN D3) 1,000 Units, Oral, Daily   cloNIDine (CATAPRES) 0.1 mg, Oral, 2 times daily   colchicine 0.6 mg, Oral, 2 times daily PRN   doxazosin (CARDURA) 4 mg, Oral, Daily at bedtime   ferrous fumarate (HEMOCYTE - 106 MG FE) 325 (106 Fe) MG TABS tablet 106 mg of iron, Oral, 2 times daily   furosemide (LASIX) 20 mg, Oral, Daily   HYDROcodone-acetaminophen (NORCO/VICODIN) 5-325 MG tablet 1 tablet, Oral, Every 6 hours PRN   ipratropium-albuterol (DUONEB) 0.5-2.5 (3) MG/3ML SOLN 3 mLs, Nebulization, 2 times daily   ketoconazole (NIZORAL) 2 % cream 1 application , Topical, Daily   losartan (COZAAR) 50 MG tablet 1 tablet, Oral, Daily   MAGNESIUM PO 1 tablet, Oral, Daily   Multiple Vitamin (MULTIVITAMIN) tablet 1 tablet, Oral, Daily,     OXYGEN 3 L, Inhalation, Continuous   pioglitazone (ACTOS) 45 mg, Oral, Daily   polyethylene glycol powder (GLYCOLAX/MIRALAX) 17 GM/SCOOP powder Oral   saline (AYR) GEL 1 application , Each Nare, Every 12 hours   senna (SENOKOT) 8.6 mg, Oral, 2 times daily   sodium chloride (OCEAN) 0.65 % SOLN nasal spray 1 spray, Each Nare, As needed   Tdap (BOOSTRIX) 5-2.5-18.5 LF-MCG/0.5 injection 0.5 mLs, Intramuscular,  Once       Objective:   Physical Exam BP 132/74   Pulse 66  Temp 97.6 F (36.4 C) (Oral)   Resp 18   Ht '5\' 8"'$  (1.727 m)   Wt 173 lb 4 oz (78.6 kg)   SpO2 91% Comment: 4L/min  BMI 26.34 kg/m  General:   Well developed, NAD, BMI noted.  On a portable oxygen tank HEENT:  Normocephalic . Face symmetric, atraumatic Lungs:  Decreased breath sounds. Normal respiratory effort, no intercostal retractions, no accessory muscle use. Heart: RRR,  no murmur.  Lower extremities: no pretibial edema bilaterally  Skin: Not pale. Not jaundice Neurologic:  alert & oriented X3.  Speech normal, gait appropriate for age and unassisted Psych--  Cognition and judgment appear intact.  Cooperative with normal attention span and concentration.   Behavior appropriate. No anxious or depressed appearing.      Assessment     Assessment  DM Dr. Posey Pronto  HTN Hyperlipidemia CKD: Sees nephrology DJD-- rarely uses hydrocodone Gout -- occ colchicine PULM: Chronic respiratory failure with hypoxia --COPD on oxygen  24/7   --OSA - no compliant w/ cpap --Pulmonary fibrosis GU: ---BPH    ---High-grade papillary urothelial carcinoma Dx 07/2020, s/p surgery   PLAN DM: Per Endo JIR:[id number] on clonidine, Cardura, losartan, carvedilol, amlodipine. He was taking Lasix but  ran out of it ,  BP remains well-controlled and he has no edema.  We agreed not to take furosemide and discussed with nephrology. CKD: See above, has an appointment to see nephrology in few weeks.  Will check a BMP. Mild anemia: No recent labs, check a CBC, iron and ferritin. High cholesterol: On atorvastatin, check FLP. Pulmonary: Saw pulmonary 10/17/2021, DOE felt to be multifactorial. ILD felt to be likely IPF, patient was not ready for antifibrotic's. Lung nodule evaluation: CT in the near future.  They noted to be a poor candidate for a resection if that is a malignancy Polypharmacy: Has 22 items  on his medication list,  I reviewed every single medication with him and explain what it is for.  We deleted  not needed meds  The patient and his family member tells me that iron, magnesium and vitamin D are managed by nephrology. Preventive care: PNM 20 today, see AVS RTC 4 months CPX    Time spent 33 minutes.  Long discussion about medication compliance, co

## 2021-11-20 ENCOUNTER — Ambulatory Visit (HOSPITAL_BASED_OUTPATIENT_CLINIC_OR_DEPARTMENT_OTHER)
Admission: RE | Admit: 2021-11-20 | Discharge: 2021-11-20 | Disposition: A | Discharge status: Home / Self Care | Payer: Medicare HMO | Source: Ambulatory Visit | Attending: Internal Medicine | Admitting: Internal Medicine

## 2021-11-20 DIAGNOSIS — R911 Solitary pulmonary nodule: Secondary | ICD-10-CM | POA: Diagnosis not present

## 2021-11-20 DIAGNOSIS — K449 Diaphragmatic hernia without obstruction or gangrene: Secondary | ICD-10-CM | POA: Diagnosis not present

## 2021-11-20 DIAGNOSIS — J432 Centrilobular emphysema: Secondary | ICD-10-CM | POA: Diagnosis not present

## 2021-11-20 DIAGNOSIS — J84112 Idiopathic pulmonary fibrosis: Secondary | ICD-10-CM | POA: Diagnosis not present

## 2021-11-20 DIAGNOSIS — J479 Bronchiectasis, uncomplicated: Secondary | ICD-10-CM | POA: Diagnosis not present

## 2021-11-28 ENCOUNTER — Ambulatory Visit: Payer: Medicare HMO | Admitting: Pulmonary Disease

## 2021-12-04 DIAGNOSIS — R82998 Other abnormal findings in urine: Secondary | ICD-10-CM | POA: Diagnosis not present

## 2021-12-04 DIAGNOSIS — Z8551 Personal history of malignant neoplasm of bladder: Secondary | ICD-10-CM | POA: Diagnosis not present

## 2021-12-10 NOTE — Progress Notes (Deleted)
See RN note.

## 2021-12-10 NOTE — Progress Notes (Signed)
error 

## 2021-12-10 NOTE — Progress Notes (Deleted)
This is an error encounter

## 2021-12-13 ENCOUNTER — Encounter: Payer: Self-pay | Admitting: Pulmonary Disease

## 2021-12-13 ENCOUNTER — Ambulatory Visit: Payer: Medicare HMO | Admitting: Pulmonary Disease

## 2021-12-13 VITALS — HR 56 | Wt 175.0 lb

## 2021-12-13 DIAGNOSIS — J9611 Chronic respiratory failure with hypoxia: Secondary | ICD-10-CM | POA: Diagnosis not present

## 2021-12-13 NOTE — Patient Instructions (Signed)
Nice to see you again  No changes to medications  The spot but we will keep an eye on this unchanged over the last 3 months and slightly enlarged over 3 years.  No need to worry about this in the future.  Return to clinic in 3 months or sooner as needed with Dr. Silas Flood

## 2021-12-13 NOTE — Progress Notes (Signed)
$'@Patient'X$  ID: Bradley Johns, male    DOB: 09-02-1934, 87 y.o.   MRN: [id number]  Chief Complaint  Patient presents with   Follow-up    Pt is here for follow up for chronic resp failure. Pt is on #L on his poc. Pt states that his breathing is more labored right now. He states if he does anything to exert himself he gets SOB.     Referring provider: Colon Branch, MD  HPI:   87 y.o. with COPD and possible pulmonary fibrosis whom we are seeing in follow up.   At last visit discussed nodule posterior right upper lobe.  Repeat CT in interim.  Essentially stable.  Compared to 2020 may be enlarged by 2 to 3 cm.  We discussed that worse this is a small growing cancer.  Given his underlying respiratory illness is unlikely to make a major impact on his life in terms of mortality etc.  After shared decision making decided no further follow-up or work-up.  We discussed at length checking his oxygen saturations to keep it above 88%.  He is to let me know if he needs to increase his oxygen flow rate to keep up at home.  This may mean he can no longer use pulsed oxygen but we will do without when needed.  Lastly, once again discussed the role of antifibrotic's in the setting of UIP.  We discussed this multiple times.  He has been precontemplative.  Today he and daughter state that he does not want to take antifibrotic's now or in the future.  HPI at initial visit: Patient states he is doing okay.  He has baseline dyspnea on exertion.  He thinks is gradual worse over time but not much change over the last several months.  Certainly worse over the last couple months although he thinks he slowly getting stamina back.  He was hospitalized on Christmas Day 2021.  He had a fall at home.  Hemoglobin was low.  He received blood.  VQ scan was negative for PE.  He got much worse couple days later and was given Lasix and Solu-Medrol for worsening hypoxemia.  He has brief with described as bradycardic PEA arrest and received  CPR and chemical resuscitation.  He was not intubated.  Unclear if he truly lost a pulse.  He was monitored in the ICU after this.  He suddenly improved and was discharged home.  Had a rehab stent.  He is now back at home.  Again dyspnea worse than prior.  Not much worse overall from several months ago.  He thinks he slowly get the strength back from hospitalization.  He is using oxygen as prescribed.  Using around 4 L mostly.  He is on 3 L today.  No recent upper respiratory infections in the last month.  Hemoglobin most recently last month was greater than 10.  His oxygen saturation today is 95% on supplemental O2.  Reviewed chest images most recent chest x-ray 12/28 2021 which shows interstitial changes worsened from admission concerning for pulmonary edema on my interpretation.  Most recent cross-sectional imaging 06/09/2018 reviewed which shows significant emphysematous changes in the upper lobes, paraseptal and peripheral cystic changes throughout consistent with emphysema versus pulmonary fibrosis on my interpretation.  Most recent TTE 03/2020 reviewed with normal EF, reportedly normal diastolic pressures, dilated LA, MVR, mild AS, elevated RA pressures, RA size normal, RV size and function normal, moderately elevated PASP.  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, former smoker, quit many years ago, about 60-pack-year history  Questionaires / Pulmonary Flowsheets:   ACT:      No data to display           MMRC: mMRC Dyspnea Scale mMRC Score  06/14/2020  3:12 PM 3    Epworth:      No data to display           Tests:   FENO:  No results found for:  "NITRICOXIDE"  PFT:    Latest Ref Rng & Units 04/21/2014    9:38 AM  PFT Results  FVC-Pre L 1.99   FVC-Predicted Pre % 59   FVC-Post L 2.22   FVC-Predicted Post % 66   Pre FEV1/FVC % % 74   Post FEV1/FCV % % 74   FEV1-Pre L 1.47   FEV1-Predicted Pre % 60   FEV1-Post L 1.65   DLCO uncorrected ml/min/mmHg 8.10   DLCO UNC% % 27   DLVA Predicted % 51   TLC L 4.21   TLC % Predicted % 64   RV % Predicted % 81   PFTs reviewed on my interpretation revealed moderate restriction with reduced DLCO, normal FEV1/FVC ratio, FEV1 and FVC both reduced, DLCO severely reduced  WALK:     12/28/2012    4:49 PM 12/28/2012    4:43 PM 12/14/2012    4:11 PM 12/14/2012    4:10 PM  SIX MIN WALK  Supplimental Oxygen during Test? (L/min) Yes Yes Yes No  O2 Flow Rate 3 L/min 2 L/min 3 L/min   Type Continuous Continuous Continuous   Tech Comments:  Pt was placed on 3 liters O2 cont flowand then recovered 91%  Pt desaturated 1 st lap 83% 2 liters. Pt was placed on 3 liters cont flow and O2 sats was 91%. pt did 2 laps with 3 liters cont flow and sats maintained 95%     Imaging: Personally reviewed and as per EMR discussion in this note  Lab Results: Personally reviewed CBC    Component Value Date/Time   WBC 5.3 11/14/2021 1150   RBC 3.71 (L) 11/14/2021 1150   HGB 11.6 (L) 11/14/2021 1150   HCT 36.2 (L) 11/14/2021 1150   PLT 188.0 11/14/2021 1150   MCV 97.6 11/14/2021 1150   MCH 31.8 11/09/2020 0452   MCHC 32.0 11/14/2021 1150   RDW 15.1 11/14/2021 1150   LYMPHSABS 1.3 11/14/2021 1150   MONOABS 0.6 11/14/2021 1150   EOSABS 0.2 11/14/2021 1150   BASOSABS 0.0 11/14/2021 1150    BMET    Component Value Date/Time   NA 139 11/14/2021 1150   K 5.0 11/14/2021 1150   CL 101 11/14/2021 1150   CO2 30 11/14/2021 1150   GLUCOSE 140 (H) 11/14/2021 1150   GLUCOSE 176 07/12/2008 0000   BUN 35 (H) 11/14/2021 1150   CREATININE 2.28 (H) 11/14/2021 1150   CREATININE 1.36 (H) 12/21/2015 1430   CALCIUM  9.3 11/14/2021 1150   GFRNONAA 36 (L) 11/09/2020 0452   GFRAA >60 05/25/2018 0310    BNP    Component Value Date/Time   BNP 471.6 (H) 08/05/2020 1403    ProBNP    Component Value Date/Time   PROBNP 30.0 12/13/2012 1615  Specialty Problems       Pulmonary Problems   COPD with emphysema, on O2    FEV1 67%       Sleep apnea           Dyspnea on exertion   Pulmonary fibrosis (HCC)    Favor IPF DLCO 32%      Chronic respiratory failure with hypoxia (HCC)   Hypoxia   Acute on chronic respiratory failure with hypoxia (HCC)   SOB (shortness of breath)    No Known Allergies  Immunization History  Administered Date(s) Administered   Fluad Quad(high Dose 65+) 12/31/2018, 05/07/2020, 03/07/2021   Influenza Split 03/18/2011   Influenza Whole 01/10/2009, 01/11/2010   Influenza, High Dose Seasonal PF 02/14/2013, 12/29/2014, 12/21/2015, 03/10/2017, 01/11/2018   Influenza, Seasonal, Injecte, Preservative Fre 04/06/2012   Influenza,inj,Quad PF,6+ Mos 02/16/2014   PFIZER Comirnaty(Gray Top)Covid-19 Tri-Sucrose Vaccine 08/30/2020   PFIZER(Purple Top)SARS-COV-2 Vaccination 06/30/2019, 07/21/2019, 01/26/2020, 03/07/2021   PNEUMOCOCCAL CONJUGATE-20 11/14/2021   Pfizer Covid-19 Vaccine Bivalent Booster 2yr & up 03/07/2021   Pneumococcal Conjugate-13 12/29/2014   Pneumococcal Polysaccharide-23 10/20/1999, 07/11/2008, 06/15/2013   Td 04/10/1999, 01/11/2010    Past Medical History:  Diagnosis Date   Anemia    iron   Arthritis    Benign prostatic hypertrophy    COPD (chronic obstructive pulmonary disease) (HCC)    24/7 O2   Diabetes mellitus    per Dr PPosey Pronto  Epidermal cyst of face    left lower lateral cheek   Gout    History of colon polyps    History of kidney stones 1980s   Hyperlipidemia    Hypertension    Kidney disease    Pulmonary fibrosis ([facility] 2014   Sleep apnea    Urothelial carcinoma ([facility] 05/24/2020    Tobacco History: Social History    Tobacco Use  Smoking Status Former   Packs/day: 1.50   Years: 40.00   Total pack years: 60.00   Types: Cigarettes   Quit date: 04/07/1996   Years since quitting: 25.7  Smokeless Tobacco Never   Counseling given: Not Answered    Continue to not smoke  Outpatient Encounter Medications as of 12/13/2021  Medication Sig   albuterol (VENTOLIN HFA) 108 (90 Base) MCG/ACT inhaler Inhale 2 puffs into the lungs every 4 (four) hours as needed for wheezing or shortness of breath.   amLODipine (NORVASC) 10 MG tablet Take 1 tablet (10 mg total) by mouth daily.   atorvastatin (LIPITOR) 80 MG tablet Take 1 tablet (80 mg total) by mouth daily.   carvedilol (COREG) 12.5 MG tablet Take 1 tablet by mouth in the morning and at bedtime.   cholecalciferol (VITAMIN D3) 25 MCG (1000 UNIT) tablet Take 1,000 Units by mouth daily.   cloNIDine (CATAPRES) 0.1 MG tablet Take 1 tablet (0.1 mg total) by mouth 2 (two) times daily.   colchicine 0.6 MG tablet Take 1 tablet (0.6 mg total) by mouth 2 (two) times daily as needed.   doxazosin (CARDURA) 4 MG tablet Take 1 tablet (4 mg total) by mouth at bedtime.   ferrous fumarate (HEMOCYTE - 106 MG FE) 325 (106 Fe) MG TABS tablet Take 1 tablet (106 mg of iron total) by mouth 2 (two) times daily.   HYDROcodone-acetaminophen (NORCO/VICODIN) 5-325 MG tablet Take 1 tablet by mouth every 6 (six) hours as needed for moderate pain.   ipratropium-albuterol (DUONEB) 0.5-2.5 (3) MG/3ML SOLN Take 3 mLs by nebulization in the morning and at bedtime.   ketoconazole (NIZORAL) 2 %  cream Apply 1 application topically daily.   losartan (COZAAR) 50 MG tablet Take 1 tablet by mouth daily.   MAGNESIUM PO Take 1 tablet by mouth daily.   OXYGEN Inhale 3 L into the lungs continuous.   pioglitazone (ACTOS) 45 MG tablet Take 45 mg by mouth daily.   saline (AYR) GEL Place 1 application into both nostrils every 12 (twelve) hours.   senna (SENOKOT) 8.6 MG TABS tablet Take 1 tablet (8.6 mg total) by  mouth 2 (two) times daily.   sodium chloride (OCEAN) 0.65 % SOLN nasal spray Place 1 spray into both nostrils as needed for congestion.   polyethylene glycol powder (GLYCOLAX/MIRALAX) 17 GM/SCOOP powder Take by mouth. (Patient not taking: Reported on 12/13/2021)   No facility-administered encounter medications on file as of 12/13/2021.     Review of Systems  Review of Systems  n/a Physical Exam  Pulse (!) 56   Wt 175 lb (79.4 kg)   BMI 26.61 kg/m   Wt Readings from Last 5 Encounters:  12/13/21 175 lb (79.4 kg)  11/14/21 173 lb 4 oz (78.6 kg)  10/17/21 176 lb 6.4 oz (80 kg)  08/09/21 175 lb (79.4 kg)  08/01/21 175 lb 6 oz (79.5 kg)    BMI Readings from Last 5 Encounters:  12/13/21 26.61 kg/m  11/14/21 26.34 kg/m  10/17/21 26.82 kg/m  08/09/21 26.61 kg/m  08/01/21 26.67 kg/m     Physical Exam General: Thin, sitting in exam chair Neck: Supple, no JVP Eyes: EOMI, no icterus Pulmonary: Distant sounds throughout,, crackles bilateral bases, NWOB on [place] Cardiovascular: Regular rate and rhythm, no murmur   Assessment & Plan:   Dyspnea exertion: Multifactorial, related to COPD, deconditioning, likely pulmonary fibrosis although do query if this is just septal and peripheral emphysematous changes, significant valvular disease, diastolic dysfunction, left atrial hypertension.   COPD: Presumed based on emphysema, PFTs showed restrictive pattern although flow volume curve consistent with obstruction.  Rare bronchodilator use.  Instructed to use DuoNebs twice daily.  Hypoxemic respiratory failure, chronic: related to emphysema, COPD. Had high BP while admitted and improved with diuretics - suspect flash pulmonary edema. Chronic LA dilation so pre-disposed to fluid accumulation. Advised to continue lasix started during admission.  Tolerating POC device well.  To monitor oxygen saturation to maintain above 88% and contact me if flow rates are increasing in order to maintain  this.  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  Discussed role and rationale for antifibrotic's.  Have discussed on multiple occasions the antifibrotic medications and even provided him with names of his own research.  He and his daughter state that he has not used his medications after thoughtful contemplation.  Lung nodule: Initial 9 mm in 2020 with possible enlarging solid portion.  Concern for lung cancer.  Versus cystic lesion filled with mucus.  Discussed that if this is cancer is slow-growing, increased 2 to 3 mm over the last 3 years.  Given his severe comorbid fibrosis and hypoxemic respiratory failure, this is malignancy is unlikely to significantly affect his mortality or quality of life.  Is possible treatment of this could make things worse.  After shared decision making he and daughter decided no further follow-up for this lesion.  Return in about 3 months (around 03/14/2022).   Lanier Clam, MD 12/13/2021  I spent 45 minutes in the care of the patient including face-to-face visit, review of records, coordination of care.

## 2021-12-19 DIAGNOSIS — D649 Anemia, unspecified: Secondary | ICD-10-CM | POA: Diagnosis not present

## 2021-12-19 DIAGNOSIS — R309 Painful micturition, unspecified: Secondary | ICD-10-CM | POA: Diagnosis not present

## 2021-12-19 DIAGNOSIS — C679 Malignant neoplasm of bladder, unspecified: Secondary | ICD-10-CM | POA: Diagnosis not present

## 2021-12-19 DIAGNOSIS — R809 Proteinuria, unspecified: Secondary | ICD-10-CM | POA: Diagnosis not present

## 2021-12-19 DIAGNOSIS — E1169 Type 2 diabetes mellitus with other specified complication: Secondary | ICD-10-CM | POA: Diagnosis not present

## 2021-12-19 DIAGNOSIS — J449 Chronic obstructive pulmonary disease, unspecified: Secondary | ICD-10-CM | POA: Diagnosis not present

## 2021-12-19 DIAGNOSIS — N183 Chronic kidney disease, stage 3 unspecified: Secondary | ICD-10-CM | POA: Diagnosis not present

## 2021-12-19 DIAGNOSIS — I10 Essential (primary) hypertension: Secondary | ICD-10-CM | POA: Diagnosis not present

## 2021-12-19 DIAGNOSIS — J9611 Chronic respiratory failure with hypoxia: Secondary | ICD-10-CM | POA: Diagnosis not present

## 2021-12-19 DIAGNOSIS — I509 Heart failure, unspecified: Secondary | ICD-10-CM | POA: Diagnosis not present

## 2021-12-24 NOTE — Patient Instructions (Signed)
Error

## 2022-02-20 DIAGNOSIS — E1142 Type 2 diabetes mellitus with diabetic polyneuropathy: Secondary | ICD-10-CM | POA: Diagnosis not present

## 2022-02-20 DIAGNOSIS — E7800 Pure hypercholesterolemia, unspecified: Secondary | ICD-10-CM | POA: Diagnosis not present

## 2022-02-20 DIAGNOSIS — I10 Essential (primary) hypertension: Secondary | ICD-10-CM | POA: Diagnosis not present

## 2022-02-20 LAB — HM DIABETES FOOT EXAM

## 2022-02-20 LAB — HEMOGLOBIN A1C: Hemoglobin A1C: 5.6

## 2022-03-12 ENCOUNTER — Encounter: Payer: Self-pay | Admitting: Pulmonary Disease

## 2022-03-12 ENCOUNTER — Ambulatory Visit: Payer: Medicare HMO | Admitting: Pulmonary Disease

## 2022-03-12 VITALS — BP 124/66 | HR 62 | Ht 68.0 in | Wt 173.6 lb

## 2022-03-12 DIAGNOSIS — J439 Emphysema, unspecified: Secondary | ICD-10-CM | POA: Diagnosis not present

## 2022-03-12 DIAGNOSIS — J8410 Pulmonary fibrosis, unspecified: Secondary | ICD-10-CM

## 2022-03-12 DIAGNOSIS — J9611 Chronic respiratory failure with hypoxia: Secondary | ICD-10-CM

## 2022-03-12 NOTE — Patient Instructions (Addendum)
Nice to see you again  We will look to contact [employer] regarding getting some oxygen tanks for you to have at home for emergency reasons.  I suspect they will not fill the tanks you at the estate sale previously like we discussed.  Return to clinic in 6 months or sooner if needed with Dr. Silas Flood

## 2022-03-12 NOTE — Progress Notes (Signed)
$'@Patient't$  ID: Bradley Johns, male    DOB: 07/11/1934, 87 y.o.   MRN: [id number]  Chief Complaint  Patient presents with   Follow-up    Pt is here for chronic resp failure. Pt states he is needing a scripts from the o2 that he brough in an estate sale to get them filled. He is on his poc today at 3-4L. Pt     Referring provider: Colon Branch, MD  HPI:   87 y.o. with COPD and pulmonary fibrosis and slightly enlarging lung nodule over time whom we are seeing in follow up.   Since about the same.  Still very dyspneic.  Because he has good reasons for this.  Using oxygen as prescribed.  Frustrated as [employer] POC and has a Hydrologist but has not been able to obtain tanks.  He states he got a set of tanks from the state so many years ago and they would not fill them.  I stated that since they are not the DME company tank, they are unlikely to fill them for liability reasons.  Daughter in room states that this is the reason they were given for the tanks he currently has not to be refilled.Marland Kitchen  HPI at initial visit: Patient states he is doing okay.  He has baseline dyspnea on exertion.  He thinks is gradual worse over time but not much change over the last several months.  Certainly worse over the last couple months although he thinks he slowly getting stamina back.  He was hospitalized on Christmas Day 2021.  He had a fall at home.  Hemoglobin was low.  He received blood.  VQ scan was negative for PE.  He got much worse couple days later and was given Lasix and Solu-Medrol for worsening hypoxemia.  He has brief with described as bradycardic PEA arrest and received CPR and chemical resuscitation.  He was not intubated.  Unclear if he truly lost a pulse.  He was monitored in the ICU after this.  He suddenly improved and was discharged home.  Had a rehab stent.  He is now back at home.  Again dyspnea worse than prior.  Not much worse overall from several months ago.  He thinks he slowly get the  strength back from hospitalization.  He is using oxygen as prescribed.  Using around 4 L mostly.  He is on 3 L today.  No recent upper respiratory infections in the last month.  Hemoglobin most recently last month was greater than 10.  His oxygen saturation today is 95% on supplemental O2.  Reviewed chest images most recent chest x-ray 12/28 2021 which shows interstitial changes worsened from admission concerning for pulmonary edema on my interpretation.  Most recent cross-sectional imaging 06/09/2018 reviewed which shows significant emphysematous changes in the upper lobes, paraseptal and peripheral cystic changes throughout consistent with emphysema versus pulmonary fibrosis on my interpretation.  Most recent TTE 03/2020 reviewed with normal EF, reportedly normal diastolic pressures, dilated LA, MVR, mild AS, elevated RA pressures, RA size normal, RV size and function normal, moderately elevated PASP.  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, former smoker, quit many years ago, about 60-pack-year history  Questionaires / Pulmonary Flowsheets:  ACT:      No data to display           MMRC: mMRC Dyspnea Scale mMRC Score  06/14/2020  3:12 PM 3    Epworth:      No data to display           Tests:   FENO:  No results found for: "NITRICOXIDE"  PFT:    Latest Ref Rng & Units 04/21/2014    9:38 AM  PFT Results  FVC-Pre L 1.99   FVC-Predicted Pre % 59   FVC-Post L 2.22   FVC-Predicted Post % 66   Pre FEV1/FVC % % 74   Post FEV1/FCV % % 74   FEV1-Pre L 1.47   FEV1-Predicted Pre % 60   FEV1-Post L 1.65   DLCO uncorrected ml/min/mmHg 8.10   DLCO UNC% % 27    DLVA Predicted % 51   TLC L 4.21   TLC % Predicted % 64   RV % Predicted % 81   PFTs reviewed on my interpretation revealed moderate restriction with reduced DLCO, normal FEV1/FVC ratio, FEV1 and FVC both reduced, DLCO severely reduced  WALK:     12/28/2012    4:49 PM 12/28/2012    4:43 PM 12/14/2012    4:11 PM 12/14/2012    4:10 PM  SIX MIN WALK  Supplimental Oxygen during Test? (L/min) Yes Yes Yes No  O2 Flow Rate 3 L/min 2 L/min 3 L/min   Type Continuous Continuous Continuous   Tech Comments:  Pt was placed on 3 liters O2 cont flowand then recovered 91%  Pt desaturated 1 st lap 83% 2 liters. Pt was placed on 3 liters cont flow and O2 sats was 91%. pt did 2 laps with 3 liters cont flow and sats maintained 95%     Imaging: Personally reviewed and as per EMR discussion in this note  Lab Results: Personally reviewed CBC    Component Value Date/Time   WBC 5.3 11/14/2021 1150   RBC 3.71 (L) 11/14/2021 1150   HGB 11.6 (L) 11/14/2021 1150   HCT 36.2 (L) 11/14/2021 1150   PLT 188.0 11/14/2021 1150   MCV 97.6 11/14/2021 1150   MCH 31.8 11/09/2020 0452   MCHC 32.0 11/14/2021 1150   RDW 15.1 11/14/2021 1150   LYMPHSABS 1.3 11/14/2021 1150   MONOABS 0.6 11/14/2021 1150   EOSABS 0.2 11/14/2021 1150   BASOSABS 0.0 11/14/2021 1150    BMET    Component Value Date/Time   NA 139 11/14/2021 1150   K 5.0 11/14/2021 1150   CL 101 11/14/2021 1150   CO2 30 11/14/2021 1150   GLUCOSE 140 (H) 11/14/2021 1150   GLUCOSE 176 07/12/2008 0000   BUN 35 (H) 11/14/2021 1150   CREATININE 2.28 (H) 11/14/2021 1150   CREATININE 1.36 (H) 12/21/2015 1430   CALCIUM 9.3 11/14/2021 1150   GFRNONAA 36 (L) 11/09/2020 0452   GFRAA >60 05/25/2018 0310    BNP    Component Value Date/Time   BNP 471.6 (H) 08/05/2020 1403    ProBNP    Component Value Date/Time   PROBNP 30.0 12/13/2012 1615    Specialty Problems       Pulmonary Problems   COPD with emphysema, on O2    FEV1 67%       Sleep  apnea           Dyspnea on exertion   Pulmonary fibrosis (HCC)    Favor IPF DLCO 32%  Chronic respiratory failure with hypoxia (HCC)   Hypoxia   Acute on chronic respiratory failure with hypoxia (HCC)   SOB (shortness of breath)    No Known Allergies  Immunization History  Administered Date(s) Administered   Fluad Quad(high Dose 65+) 12/31/2018, 05/07/2020, 03/07/2021   Influenza Split 03/18/2011   Influenza Whole 01/10/2009, 01/11/2010   Influenza, High Dose Seasonal PF 02/14/2013, 12/29/2014, 12/21/2015, 03/10/2017, 01/11/2018   Influenza, Seasonal, Injecte, Preservative Fre 04/06/2012   Influenza,inj,Quad PF,6+ Mos 02/16/2014   PFIZER Comirnaty(Gray Top)Covid-19 Tri-Sucrose Vaccine 08/30/2020   PFIZER(Purple Top)SARS-COV-2 Vaccination 06/30/2019, 07/21/2019, 01/26/2020, 03/07/2021   PNEUMOCOCCAL CONJUGATE-20 11/14/2021   Pfizer Covid-19 Vaccine Bivalent Booster 15yr & up 03/07/2021   Pneumococcal Conjugate-13 12/29/2014   Pneumococcal Polysaccharide-23 10/20/1999, 07/11/2008, 06/15/2013   Td 04/10/1999, 01/11/2010    Past Medical History:  Diagnosis Date   Anemia    iron   Arthritis    Benign prostatic hypertrophy    COPD (chronic obstructive pulmonary disease) (HCC)    24/7 O2   Diabetes mellitus    per Dr PPosey Pronto  Epidermal cyst of face    left lower lateral cheek   Gout    History of colon polyps    History of kidney stones 1980s   Hyperlipidemia    Hypertension    Kidney disease    Pulmonary fibrosis ([facility] 2014   Sleep apnea    Urothelial carcinoma ([facility] 05/24/2020    Tobacco History: Social History   Tobacco Use  Smoking Status Former   Packs/day: 1.50   Years: 40.00   Total pack years: 60.00   Types: Cigarettes   Quit date: 04/07/1996   Years since quitting: 25.9  Smokeless Tobacco Never   Counseling given: Not Answered    Continue to not smoke  Outpatient Encounter Medications as of 03/12/2022  Medication Sig   albuterol  (VENTOLIN HFA) 108 (90 Base) MCG/ACT inhaler Inhale 2 puffs into the lungs every 4 (four) hours as needed for wheezing or shortness of breath.   amLODipine (NORVASC) 10 MG tablet Take 1 tablet (10 mg total) by mouth daily.   atorvastatin (LIPITOR) 80 MG tablet Take 1 tablet (80 mg total) by mouth daily.   carvedilol (COREG) 12.5 MG tablet Take 1 tablet by mouth in the morning and at bedtime.   cholecalciferol (VITAMIN D3) 25 MCG (1000 UNIT) tablet Take 1,000 Units by mouth daily.   cloNIDine (CATAPRES) 0.1 MG tablet Take 1 tablet (0.1 mg total) by mouth 2 (two) times daily.   colchicine 0.6 MG tablet Take 1 tablet (0.6 mg total) by mouth 2 (two) times daily as needed.   doxazosin (CARDURA) 4 MG tablet Take 1 tablet (4 mg total) by mouth at bedtime.   ferrous fumarate (HEMOCYTE - 106 MG FE) 325 (106 Fe) MG TABS tablet Take 1 tablet (106 mg of iron total) by mouth 2 (two) times daily.   HYDROcodone-acetaminophen (NORCO/VICODIN) 5-325 MG tablet Take 1 tablet by mouth every 6 (six) hours as needed for moderate pain.   ipratropium-albuterol (DUONEB) 0.5-2.5 (3) MG/3ML SOLN Take 3 mLs by nebulization in the morning and at bedtime. Patient states he only uses it as needed   ketoconazole (NIZORAL) 2 % cream Apply 1 application topically daily.   losartan (COZAAR) 50 MG tablet Take 1 tablet by mouth daily.   MAGNESIUM PO Take 1 tablet by mouth daily.   OXYGEN Inhale 3 L into the lungs continuous.   pioglitazone (ACTOS) 45 MG tablet Take 45 mg by mouth  daily.   polyethylene glycol powder (GLYCOLAX/MIRALAX) 17 GM/SCOOP powder Take by mouth.   saline (AYR) GEL Place 1 application into both nostrils every 12 (twelve) hours.   senna (SENOKOT) 8.6 MG TABS tablet Take 1 tablet (8.6 mg total) by mouth 2 (two) times daily.   sodium chloride (OCEAN) 0.65 % SOLN nasal spray Place 1 spray into both nostrils as needed for congestion.   No facility-administered encounter medications on file as of 03/12/2022.      Review of Systems  Review of Systems  n/a Physical Exam  BP 124/66 (BP Location: Left Arm, Patient Position: Sitting, Cuff Size: Normal)   Pulse 62   Ht '5\' 8"'$  (1.727 m)   Wt 173 lb 9.6 oz (78.7 kg)   SpO2 91%   BMI 26.40 kg/m   Wt Readings from Last 5 Encounters:  03/12/22 173 lb 9.6 oz (78.7 kg)  12/13/21 175 lb (79.4 kg)  11/14/21 173 lb 4 oz (78.6 kg)  10/17/21 176 lb 6.4 oz (80 kg)  08/09/21 175 lb (79.4 kg)    BMI Readings from Last 5 Encounters:  03/12/22 26.40 kg/m  12/13/21 26.61 kg/m  11/14/21 26.34 kg/m  10/17/21 26.82 kg/m  08/09/21 26.61 kg/m     Physical Exam General: Thin, sitting in exam chair Neck: Supple, no JVP Eyes: EOMI, no icterus Pulmonary: Distant sounds throughout,, crackles bilateral bases, NWOB on [place] Cardiovascular: Regular rate and rhythm, no murmur   Assessment & Plan:   Dyspnea exertion: Multifactorial, related to COPD, deconditioning, likely pulmonary fibrosis, significant valvular disease, diastolic dysfunction, left atrial hypertension.   COPD: Presumed based on emphysema, PFTs showed restrictive pattern although flow volume curve consistent with obstruction.  Rare bronchodilator use.  Instructed to use DuoNebs twice daily.  He is not doing this.  Hypoxemic respiratory failure, chronic: related to emphysema, COPD. Had high BP while admitted and improved with diuretics - suspect flash pulmonary edema. Chronic LA dilation so pre-disposed to fluid accumulation. Advised to continue lasix started during admission.  Tolerating POC device well.  To monitor oxygen saturation to maintain above 88% and contact me if flow rates are increasing in order to maintain this.  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  Discussed role and rationale for antifibrotic's.  Have discussed on multiple occasions the antifibrotic medications and even provided him with names of his own research.  He has decided against using these medications after thoughtful  contemplation.  Lung nodule: Initial 9 mm in 2020 with possible enlarging solid portion.  Concern for lung cancer.  Versus cystic lesion filled with mucus.  Discussed that if this is cancer is slow-growing, increased 2 to 3 mm over the last 3 years.  Given his severe comorbid fibrosis and hypoxemic respiratory failure, this is malignancy is unlikely to significantly affect his mortality or quality of life.  Is possible treatment of this could make things worse.  After shared decision making he and daughter decided no further follow-up for this lesion.  Return in about 6 months (around 09/11/2022).   Lanier Clam, MD 03/12/2022  I spent 40 minutes in the care of the patient including face-to-face visit, review of records, coordination of care.

## 2022-04-09 ENCOUNTER — Encounter: Payer: Self-pay | Admitting: Internal Medicine

## 2022-04-10 ENCOUNTER — Encounter: Payer: Self-pay | Admitting: Internal Medicine

## 2022-04-10 ENCOUNTER — Encounter: Payer: Medicare HMO | Admitting: Internal Medicine

## 2022-04-23 ENCOUNTER — Telehealth: Payer: Self-pay | Admitting: Internal Medicine

## 2022-04-23 NOTE — Telephone Encounter (Signed)
Pt called stating that he had gotten a letter stating that he was going to be charged $50 for NS to his appt on 1.4.24. Pt stated he called earlier that day to cancel due to being incredibly sick to the point where he could not come in. Advised that typically when a pt calls to cancel same day, that is considered the same as a NS unless there are extreme extenuating circumstances. Pt stated he would like to see about getting this reversed as he was in no position to come in at the time. Advised a not would be sent back to look into this. Pt acknowledged understanding.

## 2022-04-28 NOTE — Telephone Encounter (Signed)
Pt called and advised

## 2022-05-16 DIAGNOSIS — C678 Malignant neoplasm of overlapping sites of bladder: Secondary | ICD-10-CM | POA: Diagnosis not present

## 2022-05-16 DIAGNOSIS — Z8551 Personal history of malignant neoplasm of bladder: Secondary | ICD-10-CM | POA: Diagnosis not present

## 2022-05-20 ENCOUNTER — Telehealth: Payer: Self-pay | Admitting: Pulmonary Disease

## 2022-05-20 DIAGNOSIS — Z7409 Other reduced mobility: Secondary | ICD-10-CM

## 2022-05-20 NOTE — Telephone Encounter (Signed)
Dr Silas Flood  Are you ok if I order a chair lift for this patient under your name or do you want him to see his PCP for this  Please advise sir

## 2022-05-21 NOTE — Telephone Encounter (Signed)
Called and spoke to wife and told her I was placing order for chair lift. Nothing further needed

## 2022-05-21 NOTE — Telephone Encounter (Signed)
Ok with me 

## 2022-05-22 ENCOUNTER — Inpatient Hospital Stay (HOSPITAL_BASED_OUTPATIENT_CLINIC_OR_DEPARTMENT_OTHER)
Admission: EM | Admit: 2022-05-22 | Discharge: 2022-05-28 | DRG: 190 | Disposition: A | Discharge status: Home Health | Payer: Medicare HMO | Source: Ambulatory Visit | Attending: Internal Medicine | Admitting: Internal Medicine

## 2022-05-22 ENCOUNTER — Emergency Department (HOSPITAL_BASED_OUTPATIENT_CLINIC_OR_DEPARTMENT_OTHER): Payer: Medicare HMO

## 2022-05-22 ENCOUNTER — Other Ambulatory Visit: Payer: Self-pay

## 2022-05-22 ENCOUNTER — Encounter (HOSPITAL_BASED_OUTPATIENT_CLINIC_OR_DEPARTMENT_OTHER): Payer: Self-pay | Admitting: Urology

## 2022-05-22 DIAGNOSIS — Z794 Long term (current) use of insulin: Secondary | ICD-10-CM

## 2022-05-22 DIAGNOSIS — Z8551 Personal history of malignant neoplasm of bladder: Secondary | ICD-10-CM

## 2022-05-22 DIAGNOSIS — J9601 Acute respiratory failure with hypoxia: Secondary | ICD-10-CM | POA: Diagnosis present

## 2022-05-22 DIAGNOSIS — I5031 Acute diastolic (congestive) heart failure: Secondary | ICD-10-CM | POA: Diagnosis not present

## 2022-05-22 DIAGNOSIS — J8410 Pulmonary fibrosis, unspecified: Secondary | ICD-10-CM | POA: Diagnosis not present

## 2022-05-22 DIAGNOSIS — Z1152 Encounter for screening for COVID-19: Secondary | ICD-10-CM | POA: Diagnosis not present

## 2022-05-22 DIAGNOSIS — I951 Orthostatic hypotension: Secondary | ICD-10-CM | POA: Diagnosis present

## 2022-05-22 DIAGNOSIS — J441 Chronic obstructive pulmonary disease with (acute) exacerbation: Secondary | ICD-10-CM | POA: Diagnosis not present

## 2022-05-22 DIAGNOSIS — I130 Hypertensive heart and chronic kidney disease with heart failure and stage 1 through stage 4 chronic kidney disease, or unspecified chronic kidney disease: Secondary | ICD-10-CM | POA: Diagnosis present

## 2022-05-22 DIAGNOSIS — N179 Acute kidney failure, unspecified: Secondary | ICD-10-CM | POA: Diagnosis not present

## 2022-05-22 DIAGNOSIS — I5030 Unspecified diastolic (congestive) heart failure: Secondary | ICD-10-CM | POA: Diagnosis present

## 2022-05-22 DIAGNOSIS — M1990 Unspecified osteoarthritis, unspecified site: Secondary | ICD-10-CM | POA: Diagnosis present

## 2022-05-22 DIAGNOSIS — J9621 Acute and chronic respiratory failure with hypoxia: Secondary | ICD-10-CM | POA: Diagnosis present

## 2022-05-22 DIAGNOSIS — D631 Anemia in chronic kidney disease: Secondary | ICD-10-CM | POA: Diagnosis present

## 2022-05-22 DIAGNOSIS — E785 Hyperlipidemia, unspecified: Secondary | ICD-10-CM | POA: Diagnosis present

## 2022-05-22 DIAGNOSIS — I5032 Chronic diastolic (congestive) heart failure: Secondary | ICD-10-CM | POA: Diagnosis present

## 2022-05-22 DIAGNOSIS — G4733 Obstructive sleep apnea (adult) (pediatric): Secondary | ICD-10-CM | POA: Diagnosis present

## 2022-05-22 DIAGNOSIS — W19XXXA Unspecified fall, initial encounter: Secondary | ICD-10-CM | POA: Diagnosis present

## 2022-05-22 DIAGNOSIS — Z833 Family history of diabetes mellitus: Secondary | ICD-10-CM

## 2022-05-22 DIAGNOSIS — G4730 Sleep apnea, unspecified: Secondary | ICD-10-CM | POA: Diagnosis present

## 2022-05-22 DIAGNOSIS — Z87442 Personal history of urinary calculi: Secondary | ICD-10-CM

## 2022-05-22 DIAGNOSIS — Z9981 Dependence on supplemental oxygen: Secondary | ICD-10-CM

## 2022-05-22 DIAGNOSIS — E1169 Type 2 diabetes mellitus with other specified complication: Secondary | ICD-10-CM | POA: Insufficient documentation

## 2022-05-22 DIAGNOSIS — N400 Enlarged prostate without lower urinary tract symptoms: Secondary | ICD-10-CM | POA: Diagnosis present

## 2022-05-22 DIAGNOSIS — Y92531 Health care provider office as the place of occurrence of the external cause: Secondary | ICD-10-CM | POA: Diagnosis not present

## 2022-05-22 DIAGNOSIS — E1122 Type 2 diabetes mellitus with diabetic chronic kidney disease: Secondary | ICD-10-CM | POA: Diagnosis not present

## 2022-05-22 DIAGNOSIS — E875 Hyperkalemia: Secondary | ICD-10-CM | POA: Diagnosis present

## 2022-05-22 DIAGNOSIS — R059 Cough, unspecified: Secondary | ICD-10-CM | POA: Diagnosis present

## 2022-05-22 DIAGNOSIS — R0902 Hypoxemia: Secondary | ICD-10-CM | POA: Diagnosis not present

## 2022-05-22 DIAGNOSIS — Z91199 Patient's noncompliance with other medical treatment and regimen due to unspecified reason: Secondary | ICD-10-CM

## 2022-05-22 DIAGNOSIS — N3289 Other specified disorders of bladder: Secondary | ICD-10-CM | POA: Diagnosis not present

## 2022-05-22 DIAGNOSIS — Z66 Do not resuscitate: Secondary | ICD-10-CM | POA: Diagnosis not present

## 2022-05-22 DIAGNOSIS — N184 Chronic kidney disease, stage 4 (severe): Secondary | ICD-10-CM | POA: Diagnosis not present

## 2022-05-22 DIAGNOSIS — R0602 Shortness of breath: Secondary | ICD-10-CM | POA: Diagnosis not present

## 2022-05-22 DIAGNOSIS — J439 Emphysema, unspecified: Secondary | ICD-10-CM | POA: Diagnosis not present

## 2022-05-22 DIAGNOSIS — E1159 Type 2 diabetes mellitus with other circulatory complications: Secondary | ICD-10-CM | POA: Insufficient documentation

## 2022-05-22 DIAGNOSIS — M109 Gout, unspecified: Secondary | ICD-10-CM | POA: Diagnosis present

## 2022-05-22 DIAGNOSIS — Z86010 Personal history of colonic polyps: Secondary | ICD-10-CM

## 2022-05-22 DIAGNOSIS — I152 Hypertension secondary to endocrine disorders: Secondary | ICD-10-CM | POA: Diagnosis present

## 2022-05-22 DIAGNOSIS — E119 Type 2 diabetes mellitus without complications: Secondary | ICD-10-CM

## 2022-05-22 DIAGNOSIS — Z8249 Family history of ischemic heart disease and other diseases of the circulatory system: Secondary | ICD-10-CM

## 2022-05-22 DIAGNOSIS — Z79899 Other long term (current) drug therapy: Secondary | ICD-10-CM

## 2022-05-22 DIAGNOSIS — Z87891 Personal history of nicotine dependence: Secondary | ICD-10-CM

## 2022-05-22 LAB — URINALYSIS, ROUTINE W REFLEX MICROSCOPIC
Bilirubin Urine: NEGATIVE
Glucose, UA: NEGATIVE mg/dL
Ketones, ur: NEGATIVE mg/dL
Leukocytes,Ua: NEGATIVE
Nitrite: NEGATIVE
Protein, ur: NEGATIVE mg/dL
Specific Gravity, Urine: 1.01 (ref 1.005–1.030)
pH: 5.5 (ref 5.0–8.0)

## 2022-05-22 LAB — CBC
HCT: 35 % — ABNORMAL LOW (ref 39.0–52.0)
Hemoglobin: 10.8 g/dL — ABNORMAL LOW (ref 13.0–17.0)
MCH: 31 pg (ref 26.0–34.0)
MCHC: 30.9 g/dL (ref 30.0–36.0)
MCV: 100.6 fL — ABNORMAL HIGH (ref 80.0–100.0)
Platelets: 157 10*3/uL (ref 150–400)
RBC: 3.48 MIL/uL — ABNORMAL LOW (ref 4.22–5.81)
RDW: 14.6 % (ref 11.5–15.5)
WBC: 5.7 10*3/uL (ref 4.0–10.5)
nRBC: 0 % (ref 0.0–0.2)

## 2022-05-22 LAB — RESP PANEL BY RT-PCR (RSV, FLU A&B, COVID)  RVPGX2
Influenza A by PCR: NEGATIVE
Influenza B by PCR: NEGATIVE
Resp Syncytial Virus by PCR: NEGATIVE
SARS Coronavirus 2 by RT PCR: NEGATIVE

## 2022-05-22 LAB — I-STAT ARTERIAL BLOOD GAS, ED
Acid-Base Excess: 1 mmol/L (ref 0.0–2.0)
Bicarbonate: 26.8 mmol/L (ref 20.0–28.0)
Calcium, Ion: 1.26 mmol/L (ref 1.15–1.40)
HCT: 32 % — ABNORMAL LOW (ref 39.0–52.0)
Hemoglobin: 10.9 g/dL — ABNORMAL LOW (ref 13.0–17.0)
O2 Saturation: 74 %
Patient temperature: 98
Potassium: 5.6 mmol/L — ABNORMAL HIGH (ref 3.5–5.1)
Sodium: 137 mmol/L (ref 135–145)
TCO2: 28 mmol/L (ref 22–32)
pCO2 arterial: 45 mmHg (ref 32–48)
pH, Arterial: 7.381 (ref 7.35–7.45)
pO2, Arterial: 39 mmHg — CL (ref 83–108)

## 2022-05-22 LAB — BASIC METABOLIC PANEL
Anion gap: 5 (ref 5–15)
BUN: 48 mg/dL — ABNORMAL HIGH (ref 8–23)
CO2: 26 mmol/L (ref 22–32)
Calcium: 8.8 mg/dL — ABNORMAL LOW (ref 8.9–10.3)
Chloride: 104 mmol/L (ref 98–111)
Creatinine, Ser: 2.95 mg/dL — ABNORMAL HIGH (ref 0.61–1.24)
GFR, Estimated: 20 mL/min — ABNORMAL LOW (ref >60)
Glucose, Bld: 118 mg/dL — ABNORMAL HIGH (ref 70–99)
Potassium: 5.2 mmol/L — ABNORMAL HIGH (ref 3.5–5.1)
Sodium: 135 mmol/L (ref 135–145)

## 2022-05-22 LAB — HEPATIC FUNCTION PANEL
ALT: 24 U/L (ref 0–44)
AST: 28 U/L (ref 15–41)
Albumin: 3.4 g/dL — ABNORMAL LOW (ref 3.5–5.0)
Alkaline Phosphatase: 75 U/L (ref 38–126)
Bilirubin, Direct: 0.2 mg/dL (ref 0.0–0.2)
Indirect Bilirubin: 0.5 mg/dL (ref 0.3–0.9)
Total Bilirubin: 0.7 mg/dL (ref 0.3–1.2)
Total Protein: 6.7 g/dL (ref 6.5–8.1)

## 2022-05-22 LAB — URINALYSIS, MICROSCOPIC (REFLEX)
Bacteria, UA: NONE SEEN
Squamous Epithelial / HPF: NONE SEEN /HPF (ref 0–5)

## 2022-05-22 LAB — D-DIMER, QUANTITATIVE: D-Dimer, Quant: 1.68 ug/mL-FEU — ABNORMAL HIGH (ref 0.00–0.50)

## 2022-05-22 LAB — CBG MONITORING, ED: Glucose-Capillary: 101 mg/dL — ABNORMAL HIGH (ref 70–99)

## 2022-05-22 LAB — TROPONIN I (HIGH SENSITIVITY)
Troponin I (High Sensitivity): 17 ng/L (ref <18)
Troponin I (High Sensitivity): 16 ng/L (ref <18)

## 2022-05-22 LAB — LIPASE, BLOOD: Lipase: 32 U/L (ref 11–51)

## 2022-05-22 LAB — BRAIN NATRIURETIC PEPTIDE: B Natriuretic Peptide: 1603.6 pg/mL — ABNORMAL HIGH (ref 0.0–100.0)

## 2022-05-22 MED ORDER — IPRATROPIUM-ALBUTEROL 0.5-2.5 (3) MG/3ML IN SOLN
3.0000 mL | RESPIRATORY_TRACT | Status: DC | PRN
  Administered 2022-05-22: 3 mL via RESPIRATORY_TRACT
  Filled 2022-05-22: qty 3

## 2022-05-22 MED ORDER — FUROSEMIDE 10 MG/ML IJ SOLN
40.0000 mg | Freq: Once | INTRAMUSCULAR | Status: AC
  Administered 2022-05-22: 40 mg via INTRAVENOUS
  Filled 2022-05-22: qty 4

## 2022-05-22 MED ORDER — SODIUM CHLORIDE 0.9 % IV BOLUS: 1000.0000 mL | Freq: Once | INTRAVENOUS | Status: DC

## 2022-05-22 MED ORDER — METHYLPREDNISOLONE SODIUM SUCC 125 MG IJ SOLR
125.0000 mg | Freq: Every day | INTRAMUSCULAR | Status: DC
  Administered 2022-05-22: 125 mg via INTRAVENOUS
  Filled 2022-05-22: qty 2

## 2022-05-22 NOTE — ED Notes (Signed)
   05/22/22 1803  Oxygen Therapy/Pulse Ox  O2 Device HFNC  O2 Therapy Oxygen humidified  Heater temperature 97.3 F (36.3 C)  O2 Flow Rate (L/min) 20 L/min  FiO2 (%) 50 %

## 2022-05-22 NOTE — ED Notes (Signed)
Re-paged hospitalist @ [phone]

## 2022-05-22 NOTE — H&P (Signed)
History and Physical    Bradley Johns [id number] DOB: August 03, 1934 DOA: 05/22/2022  PCP: Colon Branch, MD  Patient coming from: Home  I have personally briefly reviewed patient's old medical records in [place]  Chief Complaint: Hypotension  HPI: Bradley Johns is a 88 y.o. male with medical history significant for COPD/pulmonary fibrosis, chronic hypoxic respiratory failure on 3-5 L O2 continuously, CKD stage IV, HFpEF, T2DM, HTN, HLD, BPH, bladder cancer s/p TURBT, OSA not using CPAP who presented to the ED for evaluation of hypotension.  Patient states he was at his nephrologist office.  He stood up from a seated position became lightheaded and fell to the ground.  He did not lose consciousness or injure himself.  He was found to be hypotensive with BP 98/40 and hypoxic with SpO2 86% on 5 L O2 via [place].  He was subsequently sent to the ED for further evaluation.  Patient reports chronic exertional dyspnea and did have worsening shortness of breath at rest today but has improved at time of admission.  He has had a nonproductive cough.  He denies any chest pain, nausea, vomiting, abdominal pain, peripheral edema.  [facility] ED Course  Labs/Imaging on admission: I have personally reviewed following labs and imaging studies.  Initial vitals showed BP 118/89, pulse 67, RR 17, temp 97.8 F, SpO2 83% on 5 L O2.  Patient placed on HFNC 30 L.  Labs showed WBC 5.7, hemoglobin 10.8, platelets 157,000, sodium 135, potassium 5.2, bicarb 26, BUN 48, creatinine 2.95, serum glucose 118, BNP 1603.6, lipase 32, troponin negative x 2.  Portable chest x-ray shows chronic parenchymal lung changes with increased left greater than right basilar interstitial thickening.  Emphysematous changes noted.  Patient was given IV Solu-Medrol 125 mg, IV Lasix 40 mg, DuoNeb treatment.  The hospitalist service was consulted to admit for further evaluation and management.  Review of Systems: All  systems reviewed and are negative except as documented in history of present illness above.   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    Past Surgical History:  Procedure Laterality Date   COLONOSCOPY WITH PROPOFOL N/A 05/18/2018   Procedure: COLONOSCOPY WITH PROPOFOL;  Surgeon: Carol Ada, MD;  Location: [facility];  Service: Endoscopy;  Laterality: N/A;   POLYPECTOMY  05/18/2018   Procedure: POLYPECTOMY;  Surgeon: Carol Ada, MD;  Location: [facility];  Service: Endoscopy;;   TOE SURGERY     TRANSURETHRAL RESECTION OF BLADDER TUMOR WITH MITOMYCIN-C N/A 08/02/2020   Procedure: TRANSURETHRAL RESECTION OF BLADDER TUMOR;  Surgeon: Franchot Gallo, MD;  Location: [facility];  Service: Urology;  Laterality: N/A;  1 HR   TRANSURETHRAL RESECTION OF BLADDER TUMOR WITH MITOMYCIN-C N/A 11/08/2020   Procedure: TRANSURETHRAL RESECTION OF BLADDER TUMOR;  Surgeon: Franchot Gallo, MD;  Location: [facility];  Service: Urology;  Laterality: N/A;    Social History:  reports that he quit  smoking about 26 years ago. His smoking use included cigarettes. He has a 60.00 pack-year smoking history. He has never used smokeless tobacco. He reports current alcohol use. He reports that he does not use drugs.  No Known Allergies  Family History  Problem Relation Age of Onset   Liver disease Mother    Coronary artery disease Father 18   Diabetes Sister    Cancer Brother        type?   Hypertension Neg Hx    Hyperlipidemia Neg Hx    Heart attack Neg Hx    Prostate cancer Neg Hx     Colon cancer Neg Hx      Prior to Admission medications   Medication Sig Start Date End Date Taking? Authorizing Provider  albuterol (VENTOLIN HFA) 108 (90 Base) MCG/ACT inhaler Inhale 2 puffs into the lungs every 4 (four) hours as needed for wheezing or shortness of breath. 08/08/20   Shelly Coss, MD  amLODipine (NORVASC) 10 MG tablet Take 1 tablet (10 mg total) by mouth daily. 08/09/20   Shelly Coss, MD  atorvastatin (LIPITOR) 80 MG tablet Take 1 tablet (80 mg total) by mouth daily. 04/15/17   Colon Branch, MD  carvedilol (COREG) 12.5 MG tablet Take 1 tablet by mouth in the morning and at bedtime. 02/21/21   [provider]  cholecalciferol (VITAMIN D3) 25 MCG (1000 UNIT) tablet Take 1,000 Units by mouth daily.    [provider]  cloNIDine (CATAPRES) 0.1 MG tablet Take 1 tablet (0.1 mg total) by mouth 2 (two) times daily. 11/09/20   Eugenie Filler, MD  colchicine 0.6 MG tablet Take 1 tablet (0.6 mg total) by mouth 2 (two) times daily as needed. 08/13/21   Colon Branch, MD  doxazosin (CARDURA) 4 MG tablet Take 1 tablet (4 mg total) by mouth at bedtime. 03/01/19   Colon Branch, MD  ferrous fumarate (HEMOCYTE - 106 MG FE) 325 (106 Fe) MG TABS tablet Take 1 tablet (106 mg of iron total) by mouth 2 (two) times daily. 09/05/20   Colon Branch, MD  HYDROcodone-acetaminophen (NORCO/VICODIN) 5-325 MG tablet Take 1 tablet by mouth every 6 (six) hours as needed for moderate pain.    [provider]  ipratropium-albuterol (DUONEB) 0.5-2.5 (3) MG/3ML SOLN Take 3 mLs by nebulization in the morning and at bedtime. Patient states he only uses it as needed    [provider]  ketoconazole (NIZORAL) 2 % cream Apply 1 application topically daily. 03/24/18   Colon Branch, MD  losartan (COZAAR) 50 MG tablet Take 1 tablet by mouth daily. 02/21/21   [provider]  MAGNESIUM PO Take 1 tablet by mouth daily.    [provider]  OXYGEN Inhale 3 L into the lungs  continuous.    [provider]  pioglitazone (ACTOS) 45 MG tablet Take 45 mg by mouth daily.    [provider]  polyethylene glycol powder (GLYCOLAX/MIRALAX) 17 GM/SCOOP powder Take by mouth.    [provider]  saline (AYR) GEL Place 1 application into both nostrils every 12 (twelve) hours. 04/13/20   [place] Donnamarie Poag, DO  senna (SENOKOT) 8.6 MG TABS tablet Take 1 tablet (8.6 mg total) by mouth 2 (two) times daily. 08/08/20   Shelly Coss, MD  sodium chloride (OCEAN) 0.65 % SOLN nasal spray Place 1 spray into both nostrils as needed for congestion. 04/13/20   [place] Eric J, DO    Physical Exam: Vitals:  05/22/22 2100 05/22/22 2130 05/22/22 2200 05/23/22 0005  BP: (!) 178/75 (!) 166/76 (!) 160/68 (!) 169/71  Pulse: 68 69 75   Resp: 15 17 (!) 23 16  Temp:  98.1 F (36.7 C)  97.7 F (36.5 C)  TempSrc:  Oral  Oral  SpO2: (!) 88% (!) 88% (!) 89% 96%  Weight:      Height:       Constitutional: Resting in bed, NAD, calm, comfortable Eyes: EOMI, lids and conjunctivae normal ENMT: Mucous membranes are moist. Posterior pharynx clear of any exudate or lesions.Normal dentition.  Neck: normal, supple, no masses. Respiratory: Inspiratory crackles left lung base otherwise clear to auscultation. Normal respiratory effort while on 20 L HFNC. No accessory muscle use.  Speaking in full sentences. Cardiovascular: Regular rate and rhythm, no murmurs / rubs / gallops. No extremity edema. 2+ pedal pulses. Abdomen: no tenderness, no masses palpated.  Musculoskeletal: no clubbing / cyanosis. No joint deformity upper and lower extremities. Good ROM, no contractures. Normal muscle tone.  Skin: no rashes, lesions, ulcers. No induration Neurologic: Sensation intact. Strength 5/5 in all 4.  Psychiatric: Normal judgment and insight. Alert and oriented x 3. Normal mood.   EKG: Personally reviewed. Sinus rhythm, rate 57, nonspecific T wave changes inferior leads.  Similar to  prior.  Assessment/Plan Principal Problem:   Acute on chronic hypoxic respiratory failure (HCC) Active Problems:   Diabetes mellitus type 2 in nonobese (HCC)   Hyperlipidemia associated with type 2 diabetes mellitus ([place])   Hypertension associated with diabetes ([place])   COPD with emphysema, on O2   Sleep apnea   Pulmonary fibrosis (HCC)   (HFpEF) heart failure with preserved ejection fraction (HCC)   Bradley Johns is a 88 y.o. male with medical history significant for COPD/pulmonary fibrosis, chronic hypoxic respiratory failure on 3 L O2 continuously, CKD stage IV, HFpEF, T2DM, HTN, HLD, BPH, bladder cancer s/p TURBT, OSA not using CPAP who is admitted with acute on chronic hypoxic respiratory failure.  Assessment and Plan: Acute on chronic hypoxic respiratory failure COPD/pulmonary fibrosis: Usually wears 3-5 L O2 via [place] continuously at baseline.  He was hypoxic with SpO2 as low as 83% on 5 L.  Has required up to 30 L via HFNC, now on 20 L and saturating at 98%.  Despite high oxygen requirement he appears very comfortable, is not short of breath, and speaking in full clear sentences. -IV Solu-Medrol 120 mg daily -Wean off HFNC to home 3-5 L O2 via ; goal SpO2 >88% -Continue DuoNebs BID and as needed -BiPAP if needed  Acute renal failure superimposed on CKD stage IV: Creatinine 2.95 on admission compared to previous of 2.28 in August 2023.  May be secondary to hypoperfusion during hypotensive episode.  He was given IV Lasix 40 mg in the ED. -Hold further diuretics and repeat labs in a.m.  Orthostasis: History suggestive of orthostatic hypotension leading to the fall at his nephrology clinic. -Obtain orthostatic vitals -Continue Coreg but hold clonidine, losartan, and amlodipine for now  Heart failure with preserved ejection fraction: Not requiring diuretics as an outpatient.  BNP >1600 and CXR suggestive of mild interstitial edema.  Appears euvolemic on admission. -S/p IV Lasix  40 mg, hold further diuretics and monitor response -Continue carvedilol, holding losartan as above -Monitor strict I/O's  Type 2 diabetes: Well-controlled with last A1c 5.6%.  Hold home meds and placed on SSI.  Hypertension: Holding losartan, clonidine, amlodipine as above.  Continue Coreg.  Hyperlipidemia: Continue atorvastatin.  BPH: Continue doxazosin.  OSA: Not using CPAP at home.  Continue supplemental O2.   DVT prophylaxis: heparin injection 5,000 Units Start: 05/23/22 0145 Code Status: DNR, discussed with patient on admission.  He wants to discuss further with his family.  Family Communication: Discussed with patient, he has discussed with family.  Disposition Plan: From home and likely discharge to home pending clinical progress.  Consults called: None  Severity of Illness: The appropriate patient status for this patient is INPATIENT. Inpatient status is judged to be reasonable and necessary in order to provide the required intensity of service to ensure the patient's safety. The patient's presenting symptoms, physical exam findings, and initial radiographic and laboratory data in the context of their chronic comorbidities is felt to place them at high risk for further clinical deterioration. Furthermore, it is not anticipated that the patient will be medically stable for discharge from the hospital within 2 midnights of admission.   * I certify that at the point of admission it is my clinical judgment that the patient will require inpatient hospital care spanning beyond 2 midnights from the point of admission due to high intensity of service, high risk for further deterioration and high frequency of surveillance required.Zada Finders MD [facility]  If 7PM-7AM, please contact night-coverage [email or web address]  05/23/2022, 1:01 AM

## 2022-05-22 NOTE — ED Triage Notes (Signed)
Had appointment at kidney dr and fell at office BP 98/40 at office and suggested ER visit  Pt states he feels dizzy, lighteheaded, and lethargic   SPO2 86% on 5L

## 2022-05-22 NOTE — ED Notes (Signed)
Pt requesting coffee, OK per Dr Laverta Baltimore for pt to have a small amount of decaf coffee. Pt given less than 1/2 cup of decaf coffee.

## 2022-05-22 NOTE — ED Provider Notes (Signed)
Emergency Department Provider Note   I have reviewed the triage vital signs and the nursing notes.   HISTORY  Chief Complaint Hypotension   HPI Bradley Johns is a 88 y.o. male with past medical history of diabetes, hypertension, hyperlipidemia, pulmonary fibrosis, COPD with continuous home O2 of 3 L presents with worsening shortness of breath, fatigue, lightheadedness.  He was visiting his nephrologist today and had a fall in the office while bending over.  He did not fully pass out.  He denies any chest pain.  He was using his oxygen.  No abdominal pain, headache, fever.  No head injury.  In the office, they found his blood pressure to be low and that he had an increased oxygen requirement, they recommended ED transfer.   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 Positive fatigue.  Eyes: No visual changes. ENT: No sore throat. Cardiovascular: Denies chest pain. Respiratory: Positive shortness of breath. Gastrointestinal: No abdominal pain.  No nausea, no vomiting.  No diarrhea.  No constipation. Genitourinary: Negative for dysuria. Musculoskeletal: Negative for back pain. Skin: Negative for rash. Neurological: Negative for headaches, focal weakness or numbness.  ____________________________________________   PHYSICAL EXAM:  VITAL SIGNS: ED Triage Vitals  Enc Vitals Group     BP 05/22/22 1730 (!) 98/47     Pulse Rate 05/22/22 1730 60     Resp 05/22/22 1730 (!) 24     Temp 05/22/22 1730  97.8 F (36.6 C)     Temp Source 05/22/22 1730 Oral     SpO2 05/22/22 1730 (!) 87 %     Weight 05/22/22 1727 173 lb 8 oz (78.7 kg)     Height 05/22/22 1727 5' 8"$  (1.727 m)   Constitutional: Alert and oriented. Well appearing and in no acute distress but when speaking has O2 sats of 77% on 5L [place].  Eyes: Conjunctivae are normal.  Head: Atraumatic. Nose: No congestion/rhinnorhea. Mouth/Throat: Mucous membranes are moist.  Neck: No stridor.   Cardiovascular: Normal rate, regular rhythm. Good peripheral circulation. Grossly normal heart sounds.   Respiratory: Normal respiratory effort.  No retractions. Lung sounds distant with some crackles. Minimal wheezing.  Gastrointestinal: Soft and nontender. No distention.  Musculoskeletal: No lower extremity tenderness nor edema. No gross deformities of extremities. Neurologic:  Normal speech and language. No gross focal neurologic deficits are appreciated.  Skin:  Skin is warm, dry and intact. No rash noted.  ____________________________________________   LABS (all labs ordered are listed, but only abnormal results are displayed)  Labs Reviewed  BASIC METABOLIC PANEL - Abnormal; Notable for the following components:      Result Value   Potassium 5.2 (*)    Glucose, Bld 118 (*)    BUN 48 (*)    Creatinine, Ser 2.95 (*)    Calcium 8.8 (*)    GFR, Estimated 20 (*)  All other components within normal limits  CBC - Abnormal; Notable for the following components:   RBC 3.48 (*)    Hemoglobin 10.8 (*)    HCT 35.0 (*)    MCV 100.6 (*)    All other components within normal limits  URINALYSIS, ROUTINE W REFLEX MICROSCOPIC - Abnormal; Notable for the following components:   Hgb urine dipstick TRACE (*)    All other components within normal limits  BRAIN NATRIURETIC PEPTIDE - Abnormal; Notable for the following components:   B Natriuretic Peptide 1,603.6 (*)    All other components within normal limits  HEPATIC FUNCTION PANEL - Abnormal;  Notable for the following components:   Albumin 3.4 (*)    All other components within normal limits  D-DIMER, QUANTITATIVE - Abnormal; Notable for the following components:   D-Dimer, Quant 1.68 (*)    All other components within normal limits  CBG MONITORING, ED - Abnormal; Notable for the following components:   Glucose-Capillary 101 (*)    All other components within normal limits  I-STAT ARTERIAL BLOOD GAS, ED - Abnormal; Notable for the following components:   pO2, Arterial 39 (*)    Potassium 5.6 (*)    HCT 32.0 (*)    Hemoglobin 10.9 (*)    All other components within normal limits  RESP PANEL BY RT-PCR (RSV, FLU A&B, COVID)  RVPGX2  LIPASE, BLOOD  URINALYSIS, MICROSCOPIC (REFLEX)  BLOOD GAS, ARTERIAL  TROPONIN I (HIGH SENSITIVITY)  TROPONIN I (HIGH SENSITIVITY)   ____________________________________________  EKG   EKG Interpretation  Date/Time:  Thursday May 22 2022 17:37:17 EST Ventricular Rate:  57 PR Interval:  167 QRS Duration: 112 QT Interval:  443 QTC Calculation: 432 R Axis:   98 Text Interpretation: Sinus rhythm Borderline intraventricular conduction delay Repol abnrm suggests ischemia, anterolateral Change from 2021 tracing Confirmed by Nanda Quinton [phone]) on 05/22/2022 5:39:12 PM        ____________________________________________  RADIOLOGY  DG Chest Portable 1 View  Result Date: 05/22/2022 CLINICAL DATA:  Hypoxia. EXAM: PORTABLE CHEST 1 VIEW COMPARISON:  CT November 20, 2021 and radiograph Aug 05, 2020 FINDINGS: Cardiac enlargement. Aortic atherosclerosis. Central vascular prominence. Emphysematous lung changes with coarse bibasilar reticulations. Increased left-greater-than-right interstitial thickening. No visible pleural effusion or pneumothorax. No acute osseous abnormality. IMPRESSION: 1. Chronic parenchymal lung changes with increased left-greater-than-right basilar interstitial thickening, which could reflect mild interstitial edema. 2.  Aortic Atherosclerosis (ICD10-I70.0) and Emphysema (ICD10-J43.9). Electronically Signed   By: Dahlia Bailiff M.D.   On: 05/22/2022 18:06    ____________________________________________   PROCEDURES  Procedure(s) performed:   .Critical Care  Performed by: Margette Fast, MD Authorized by: Margette Fast, MD   Critical care provider statement:    Critical care time (minutes):  45   Critical care time was exclusive of:  Separately billable procedures and treating other patients and teaching time   Critical care was necessary to treat or prevent imminent or life-threatening deterioration of the following conditions:  Respiratory failure   Critical care was time spent personally by me on the following activities:  Development of treatment plan with patient or surrogate, discussions with consultants, evaluation of patient's response to treatment, examination of patient, ordering and review of laboratory studies, ordering and review of radiographic studies, ordering and performing treatments and interventions, pulse oximetry, re-evaluation of patient's condition, review of old charts, blood draw for specimens and obtaining history from patient or surrogate   I assumed direction of critical care for this patient  from another provider in my specialty: no     Care discussed with: admitting provider      ____________________________________________   INITIAL IMPRESSION / ASSESSMENT AND PLAN / ED COURSE  Pertinent labs & imaging results that were available during my care of the patient were reviewed by me and considered in my medical decision making (see chart for details).   This patient is Presenting for Evaluation of SOB, which does require a range of treatment options, and is a complaint that involves a high risk of morbidity and mortality.  The Differential Diagnoses include acute on chronic respiratory failure likely multifactorial including COPD, pulmonary fibrosis, CHF exacerbation. Also  considered ACS, PE, CAP, COVID, FLu, etc.  Critical Interventions-    Medications  ipratropium-albuterol (DUONEB) 0.5-2.5 (3) MG/3ML nebulizer solution 3 mL (3 mLs Nebulization Given 05/22/22 1833)  methylPREDNISolone sodium succinate (SOLU-MEDROL) 125 mg/2 mL injection 125 mg (125 mg Intravenous Given 05/22/22 1844)  furosemide (LASIX) injection 40 mg (40 mg Intravenous Given 05/22/22 1844)    Reassessment after intervention: O2 improved with HFNC.    I did obtain Additional Historical Information from daughter at bedside.  I decided to review pertinent External Data, and in summary patient followed by Dr. Silas Flood with Pulmonology.    Clinical Laboratory Tests Ordered, included troponin normal.  BNP is 1600.  ABG ordered but suspect venous draw per RT. patient with acute kidney injury with creatinine out of 2.95, elevated from prior.  No leukocytosis. COVID negative.   Radiologic Tests Ordered, included CXR. I independently interpreted the images and agree with radiology interpretation.   Cardiac Monitor Tracing which shows NSR.    Social Determinants of Health Risk patient is not an active smoker.   Consult complete with [facility], Dr. Marcello Moores. Plan for admit to progressive.   Medical Decision Making: Summary:  Patient presents emergency department with acute on chronic hypoxic respiratory failure.  He overall appears very well.  He is fluently conversational and does not have significant increased work of breathing on my initial assessment.  He is, however, hypoxemic requiring increasing nasal cannula oxygenation and ultimately transition to high flow nasal cannula.  Lab work as above showing kidney injury, unable to obtain a CTA PE due to this has multifactorial respiratory comorbidities likely contributing to this.  Plan to begin diuresis.  Considered BiPAP but patient has no significant increased work of breathing at this time requiring that.  The hospitalist team, by phone, requested a  noncontrast CT of the chest but on my assessment the patient will have drops in his O2 with even talking for several minutes or laying flat in bed in the exam room.  I do not feel that transporting him to the CT for a noncontrast study is worth the risk of acute decompensation in the CT scanner at this time.  He may ultimately require CT imaging but would prefer him to be more stable from an oxygenation standpoint prior to moving him and lying in prone.   Reevaluation with update and discussion with patient and family at bedside.  He is looking well on high flow, remains alert and conversational.  No acute distress.  He appears happily hypoxemic at times. Discussed plan for diuresis and transfer for admit.   Patient's presentation is most consistent with acute presentation with potential threat to life or bodily function.   Disposition: admit  ____________________________________________  FINAL CLINICAL IMPRESSION(S) / ED DIAGNOSES  Final diagnoses:  Acute on chronic respiratory failure with hypoxia ([place])  Note:  This document was prepared using Dragon voice recognition software and may include unintentional dictation errors.  Nanda Quinton, MD, [facility]    Caid Radin, [facility], MD 05/22/22 [phone]

## 2022-05-22 NOTE — Progress Notes (Signed)
  Location :Transfer from [facility] to [facility]  MD: Dr Laverta Baltimore Dx: Acute hypoxic respiratory failure due to COPD exacerbation / dCHF CC: Pt 88 y/o with hx of CKDIIIb, lung noduleHypertension, hyperlipidemia,OSA, COPD/pulmonary fibrosis  on 3L home O2,presents s/p fall with noted soft bp  MOC69'E systolic and low sat 61-48'D on baseline O2.  Patient transferred from Renal office to ED for further evaluation.  Vitals: Afeb, bp98/47, hr 60 rr 24, sat 87% on 5L  on admit  Currently pO2 93 % O2 Device High Flow Nasal Cannula Heater temperature 97.3 F (36.3 C) O2 Flow Rate (L/min) 20 L/min FiO2 (%) 50 %  Labs: [id number] Cxr: concern for interstitial edema  Resp panel negfor flu covid/rsv  -K 5.2, cr 2.95 up ( 2.28) Pending test: CT thorax  Tx: solumedrol/neb/ lasix on HFNC 20 L per min sat high 80-low 90s  Admit to Progressive care  Pulmonary critical care consult  on arrival

## 2022-05-22 NOTE — ED Notes (Signed)
[place] for transport @ 2144

## 2022-05-22 NOTE — ED Notes (Signed)
Per provider, will not be able to transport patient to CT due to O2 demands.

## 2022-05-23 ENCOUNTER — Telehealth: Payer: Self-pay | Admitting: Pulmonary Disease

## 2022-05-23 DIAGNOSIS — E119 Type 2 diabetes mellitus without complications: Secondary | ICD-10-CM | POA: Diagnosis not present

## 2022-05-23 DIAGNOSIS — J439 Emphysema, unspecified: Secondary | ICD-10-CM

## 2022-05-23 DIAGNOSIS — J9621 Acute and chronic respiratory failure with hypoxia: Secondary | ICD-10-CM | POA: Diagnosis not present

## 2022-05-23 DIAGNOSIS — I5030 Unspecified diastolic (congestive) heart failure: Secondary | ICD-10-CM | POA: Diagnosis present

## 2022-05-23 DIAGNOSIS — I5031 Acute diastolic (congestive) heart failure: Secondary | ICD-10-CM

## 2022-05-23 LAB — CBC
HCT: 34.7 % — ABNORMAL LOW (ref 39.0–52.0)
Hemoglobin: 11.3 g/dL — ABNORMAL LOW (ref 13.0–17.0)
MCH: 31.4 pg (ref 26.0–34.0)
MCHC: 32.6 g/dL (ref 30.0–36.0)
MCV: 96.4 fL (ref 80.0–100.0)
Platelets: 166 10*3/uL (ref 150–400)
RBC: 3.6 MIL/uL — ABNORMAL LOW (ref 4.22–5.81)
RDW: 14.4 % (ref 11.5–15.5)
WBC: 5 10*3/uL (ref 4.0–10.5)
nRBC: 0 % (ref 0.0–0.2)

## 2022-05-23 LAB — BASIC METABOLIC PANEL
Anion gap: 14 (ref 5–15)
BUN: 52 mg/dL — ABNORMAL HIGH (ref 8–23)
CO2: 24 mmol/L (ref 22–32)
Calcium: 9.3 mg/dL (ref 8.9–10.3)
Chloride: 98 mmol/L (ref 98–111)
Creatinine, Ser: 2.79 mg/dL — ABNORMAL HIGH (ref 0.61–1.24)
GFR, Estimated: 22 mL/min — ABNORMAL LOW (ref >60)
Glucose, Bld: 157 mg/dL — ABNORMAL HIGH (ref 70–99)
Potassium: 5.3 mmol/L — ABNORMAL HIGH (ref 3.5–5.1)
Sodium: 136 mmol/L (ref 135–145)

## 2022-05-23 LAB — GLUCOSE, CAPILLARY
Glucose-Capillary: 114 mg/dL — ABNORMAL HIGH (ref 70–99)
Glucose-Capillary: 128 mg/dL — ABNORMAL HIGH (ref 70–99)
Glucose-Capillary: 143 mg/dL — ABNORMAL HIGH (ref 70–99)
Glucose-Capillary: 145 mg/dL — ABNORMAL HIGH (ref 70–99)
Glucose-Capillary: 203 mg/dL — ABNORMAL HIGH (ref 70–99)

## 2022-05-23 LAB — MAGNESIUM: Magnesium: 2 mg/dL (ref 1.7–2.4)

## 2022-05-23 MED ORDER — SODIUM ZIRCONIUM CYCLOSILICATE 5 G PO PACK
5.0000 g | PACK | Freq: Once | ORAL | Status: AC
  Administered 2022-05-23: 5 g via ORAL
  Filled 2022-05-23: qty 1

## 2022-05-23 MED ORDER — ONDANSETRON HCL 4 MG PO TABS: 4.0000 mg | ORAL_TABLET | Freq: Four times a day (QID) | ORAL | Status: DC | PRN

## 2022-05-23 MED ORDER — ONDANSETRON HCL 4 MG/2ML IJ SOLN: 4.0000 mg | Freq: Four times a day (QID) | INTRAMUSCULAR | Status: DC | PRN

## 2022-05-23 MED ORDER — ACETAMINOPHEN 325 MG PO TABS: 650.0000 mg | ORAL_TABLET | Freq: Four times a day (QID) | ORAL | Status: DC | PRN

## 2022-05-23 MED ORDER — ACETAMINOPHEN 650 MG RE SUPP: 650.0000 mg | Freq: Four times a day (QID) | RECTAL | Status: DC | PRN

## 2022-05-23 MED ORDER — IPRATROPIUM-ALBUTEROL 0.5-2.5 (3) MG/3ML IN SOLN: 3.0000 mL | Freq: Four times a day (QID) | RESPIRATORY_TRACT | Status: DC | PRN

## 2022-05-23 MED ORDER — INSULIN ASPART 100 UNIT/ML IJ SOLN
0.0000 [IU] | Freq: Three times a day (TID) | INTRAMUSCULAR | Status: DC
  Administered 2022-05-23 (×2): 1 [IU] via SUBCUTANEOUS
  Administered 2022-05-23: 3 [IU] via SUBCUTANEOUS
  Administered 2022-05-24 (×2): 1 [IU] via SUBCUTANEOUS
  Administered 2022-05-24: 3 [IU] via SUBCUTANEOUS
  Administered 2022-05-25 (×2): 1 [IU] via SUBCUTANEOUS
  Administered 2022-05-25 – 2022-05-26 (×2): 3 [IU] via SUBCUTANEOUS
  Administered 2022-05-26: 5 [IU] via SUBCUTANEOUS
  Administered 2022-05-26 – 2022-05-27 (×2): 1 [IU] via SUBCUTANEOUS
  Administered 2022-05-27: 9 [IU] via SUBCUTANEOUS
  Administered 2022-05-27: 2 [IU] via SUBCUTANEOUS
  Administered 2022-05-28 (×2): 1 [IU] via SUBCUTANEOUS

## 2022-05-23 MED ORDER — HEPARIN SODIUM (PORCINE) 5000 UNIT/ML IJ SOLN
5000.0000 [IU] | Freq: Three times a day (TID) | INTRAMUSCULAR | Status: DC
  Administered 2022-05-23 – 2022-05-28 (×17): 5000 [IU] via SUBCUTANEOUS
  Filled 2022-05-23 (×17): qty 1

## 2022-05-23 MED ORDER — DOXAZOSIN MESYLATE 4 MG PO TABS
4.0000 mg | ORAL_TABLET | Freq: Every day | ORAL | Status: DC
  Administered 2022-05-23 – 2022-05-27 (×5): 4 mg via ORAL
  Filled 2022-05-23 (×6): qty 1

## 2022-05-23 MED ORDER — METHYLPREDNISOLONE SODIUM SUCC 125 MG IJ SOLR
120.0000 mg | INTRAMUSCULAR | Status: DC
  Administered 2022-05-23 – 2022-05-27 (×5): 120 mg via INTRAVENOUS
  Filled 2022-05-23 (×5): qty 2

## 2022-05-23 MED ORDER — CLONIDINE HCL 0.1 MG PO TABS
0.1000 mg | ORAL_TABLET | Freq: Two times a day (BID) | ORAL | Status: DC
  Administered 2022-05-23 – 2022-05-28 (×10): 0.1 mg via ORAL
  Filled 2022-05-23 (×10): qty 1

## 2022-05-23 MED ORDER — ATORVASTATIN CALCIUM 80 MG PO TABS
80.0000 mg | ORAL_TABLET | Freq: Every day | ORAL | Status: DC
  Administered 2022-05-23 – 2022-05-28 (×6): 80 mg via ORAL
  Filled 2022-05-23 (×6): qty 1

## 2022-05-23 MED ORDER — AMLODIPINE BESYLATE 10 MG PO TABS: 10.0000 mg | ORAL_TABLET | Freq: Every day | ORAL | Status: DC

## 2022-05-23 MED ORDER — IPRATROPIUM-ALBUTEROL 0.5-2.5 (3) MG/3ML IN SOLN: 3.0000 mL | Freq: Two times a day (BID) | RESPIRATORY_TRACT | Status: DC | PRN

## 2022-05-23 MED ORDER — SODIUM CHLORIDE 0.9% FLUSH
3.0000 mL | Freq: Two times a day (BID) | INTRAVENOUS | Status: DC
  Administered 2022-05-23 – 2022-05-28 (×12): 3 mL via INTRAVENOUS

## 2022-05-23 MED ORDER — FUROSEMIDE 10 MG/ML IJ SOLN
40.0000 mg | Freq: Once | INTRAMUSCULAR | Status: AC
  Administered 2022-05-23: 40 mg via INTRAVENOUS
  Filled 2022-05-23: qty 4

## 2022-05-23 MED ORDER — CARVEDILOL 12.5 MG PO TABS
12.5000 mg | ORAL_TABLET | Freq: Two times a day (BID) | ORAL | Status: DC
  Administered 2022-05-23 – 2022-05-28 (×10): 12.5 mg via ORAL
  Filled 2022-05-23 (×11): qty 1

## 2022-05-23 MED ORDER — SENNOSIDES-DOCUSATE SODIUM 8.6-50 MG PO TABS: 1.0000 | ORAL_TABLET | Freq: Every evening | ORAL | Status: DC | PRN

## 2022-05-23 NOTE — Progress Notes (Signed)
Patient was initially placed on BIPAP and did not tolerate it, patient switched to heated high flow 30L/70% and is tolerating well. RT will continue to monitor patient.

## 2022-05-23 NOTE — Telephone Encounter (Signed)
Will leave encounter open. Call patient back once he has been released from the hospital

## 2022-05-23 NOTE — Progress Notes (Signed)
[facility]  PROGRESS NOTE  Bradley Johns [id number] DOB: [birth year] DOA: 05/22/2022 PCP: Colon Branch, MD   Brief HPI:   88 year old male with medical history of COPD/pulmonary fibrosis, chronic hypoxic respiratory failure on 2 to 5 L of oxygen via nasal cannula, CKD stage IV, HFpEF, diabetes mellitus type 2, hypertension, hyperlipidemia, BPH, bladder cancer s/p TURBT, OSA not on CPAP was brought to ED for hypotension. Patient was in nephrologist office when he stood up from sitting position, became lightheaded and fell to ground.  Blood pressure was 98/40.  He was brought to hospital.  In the ED SpO2 83% on 5 L of oxygen placed on HFNC 30 L.  BNP 1603. Patient was given IV Solu-Medrol, IV Lasix and DuoNeb treatments.    Subjective   Patient denies shortness of breath.   Assessment/Plan:   Acute on chronic hypoxemic respiratory failure -At baseline requires 3 to 5 L of oxygen via  -He was found to be hypoxemic with SpO2 83% on 5 L -Initially required 30 L via HFNC, changed to 20 L/min -Started on Solu-Medrol 120 mg IV daily -Continue DuoNebs twice daily  Acute kidney injury on CKD stage IV -Creatinine improved to 2.79 -Follow BMP in am  Orthostatic hypotension -Patient had episode of orthostasis at the nephrologist office -Clonidine, losartan and amlodipine are on hold -Continue Coreg -Check orthostatic vital signs every 4 hours x 3  Heart failure with preserved ejection fraction -BNP 1600, chest x-ray suggestive of mild interstitial edema -Will give additional dose of Lasix 40 mg IV -Continue Coreg -Losartan on hold as above  Diabetes mellitus type 2 -Last hemoglobin A1c 5.6 -Continue sliding scale insulin with NovoLog  Hypertension -Blood pressure is stable -Continue Coreg -Will restart clonidine 0.1 twice daily to avoid rebound hypertension -Continue to hold losartan, amlodipine  Hyperlipidemia -Continue atorvastatin  BPH -Continue  doxazosin  OSA -Patient not on CPAP at home -Continue supplemental oxygen  Hyperkalemia -Mild elevation of potassium, 5.3 -Will give 1 dose of Lokelma 5 g -Follow BMP in am    Medications     atorvastatin  80 mg Oral Daily   carvedilol  12.5 mg Oral BID WC   doxazosin  4 mg Oral QHS   heparin  5,000 Units Subcutaneous Q8H   insulin aspart  0-9 Units Subcutaneous TID WC   methylPREDNISolone (SOLU-MEDROL) injection  120 mg Intravenous Q24H   sodium chloride flush  3 mL Intravenous Q12H     Data Reviewed:   CBG:  Recent Labs  Lab 05/22/22 1959 05/23/22 0014 05/23/22 0752 05/23/22 1216  GLUCAP 101* 114* 145* 128*    SpO2: 94 % O2 Flow Rate (L/min): 25 L/min FiO2 (%): 55 %    Vitals:   05/23/22 0752 05/23/22 0800 05/23/22 1200 05/23/22 1600  BP: (!) 154/75 (!) 146/89 (!) 161/68 (!) 135/59  Pulse: 80 73 (!) 57 69  Resp: 18 (!) 28 18 (!) 21  Temp: 97.7 F (36.5 C)  98 F (36.7 C)   TempSrc: Oral  Oral   SpO2: 90% 92% 98% 94%  Weight:      Height:          Data Reviewed:  Basic Metabolic Panel: Recent Labs  Lab 05/22/22 1738 05/22/22 2155 05/23/22 0657  NA 135 137 136  K 5.2* 5.6* 5.3*  CL 104  --  98  CO2 26  --  24  GLUCOSE 118*  --  157*  BUN 48*  --  52*  CREATININE  2.95*  --  2.79*  CALCIUM 8.8*  --  9.3  MG  --   --  2.0    CBC: Recent Labs  Lab 05/22/22 1738 05/22/22 2155 05/23/22 0657  WBC 5.7  --  5.0  HGB 10.8* 10.9* 11.3*  HCT 35.0* 32.0* 34.7*  MCV 100.6*  --  96.4  PLT 157  --  166    LFT Recent Labs  Lab 05/22/22 1740  AST 28  ALT 24  ALKPHOS 75  BILITOT 0.7  PROT 6.7  ALBUMIN 3.4*     Antibiotics: Anti-infectives (From admission, onward)    None        DVT prophylaxis: Heparin  Code Status: DNR  Family Communication: No family at bedside   CONSULTS    Objective    Physical Examination:   General-appears in no acute distress Heart-S1-S2, regular, no murmur  auscultated Lungs-bibasilar crackles auscultated Abdomen-soft, nontender, no organomegaly Extremities-no edema in the lower extremities Neuro-alert, oriented x3, no focal deficit noted  Status is: Inpatient:             [place]   [facility] If 7PM-7AM, please contact night-coverage at [email or web address], Office  [phone]   05/23/2022, 5:10 PM  LOS: 1 day

## 2022-05-23 NOTE — Telephone Encounter (Signed)
Patient is at the hospital fyi

## 2022-05-23 NOTE — Telephone Encounter (Signed)
Bradley Johns, [name]; [name], [name]; [name] We no longer provide stair chair lifts. Please have patient contact insurance provider for in network DME company that do provide them.  Thank you.

## 2022-05-23 NOTE — Hospital Course (Signed)
Bradley Johns is a 88 y.o. male with medical history significant for COPD/pulmonary fibrosis, chronic hypoxic respiratory failure on 3 L O2 continuously, CKD stage IV, HFpEF, T2DM, HTN, HLD, BPH, bladder cancer s/p TURBT, OSA not using CPAP who is admitted with acute on chronic hypoxic respiratory failure.

## 2022-05-23 NOTE — Telephone Encounter (Signed)
ATC X1 LVM for patient to call the office back

## 2022-05-24 ENCOUNTER — Inpatient Hospital Stay (HOSPITAL_COMMUNITY): Payer: Medicare HMO

## 2022-05-24 DIAGNOSIS — J9621 Acute and chronic respiratory failure with hypoxia: Secondary | ICD-10-CM | POA: Diagnosis not present

## 2022-05-24 DIAGNOSIS — I5031 Acute diastolic (congestive) heart failure: Secondary | ICD-10-CM | POA: Diagnosis not present

## 2022-05-24 DIAGNOSIS — J439 Emphysema, unspecified: Secondary | ICD-10-CM | POA: Diagnosis not present

## 2022-05-24 DIAGNOSIS — E119 Type 2 diabetes mellitus without complications: Secondary | ICD-10-CM | POA: Diagnosis not present

## 2022-05-24 LAB — BASIC METABOLIC PANEL
Anion gap: 11 (ref 5–15)
BUN: 66 mg/dL — ABNORMAL HIGH (ref 8–23)
CO2: 26 mmol/L (ref 22–32)
Calcium: 8.6 mg/dL — ABNORMAL LOW (ref 8.9–10.3)
Chloride: 99 mmol/L (ref 98–111)
Creatinine, Ser: 3.01 mg/dL — ABNORMAL HIGH (ref 0.61–1.24)
GFR, Estimated: 20 mL/min — ABNORMAL LOW (ref >60)
Glucose, Bld: 130 mg/dL — ABNORMAL HIGH (ref 70–99)
Potassium: 4.6 mmol/L (ref 3.5–5.1)
Sodium: 136 mmol/L (ref 135–145)

## 2022-05-24 LAB — GLUCOSE, CAPILLARY
Glucose-Capillary: 139 mg/dL — ABNORMAL HIGH (ref 70–99)
Glucose-Capillary: 221 mg/dL — ABNORMAL HIGH (ref 70–99)
Glucose-Capillary: 173 mg/dL — ABNORMAL HIGH (ref 70–99)
Glucose-Capillary: 182 mg/dL — ABNORMAL HIGH (ref 70–99)

## 2022-05-24 MED ORDER — IPRATROPIUM-ALBUTEROL 0.5-2.5 (3) MG/3ML IN SOLN
3.0000 mL | Freq: Four times a day (QID) | RESPIRATORY_TRACT | Status: DC
  Administered 2022-05-24 – 2022-05-26 (×9): 3 mL via RESPIRATORY_TRACT
  Filled 2022-05-24 (×9): qty 3

## 2022-05-24 NOTE — Consult Note (Signed)
   NAME:  Bradley Johns, MRN:  [id number], DOB:  [birth year], LOS: 2 ADMISSION DATE:  05/22/2022, CONSULTATION DATE:  05/24/2022 REFERRING MD:  Georgiann Mohs MD, CHIEF COMPLAINT:   Acute hypoxic respiratory failure  History of Present Illness:   88 year old with history of COPD, IPF, chronic respiratory failure on 2 to 5 L oxygen, CKD, HFpEF, diabetes, hypertension, hyperlipidemia, OSA [noncompliant] Admitted with hypotension, dizziness, acute on chronic hypoxic respiratory failure requiring high flow nasal cannula.  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   Significant Hospital Events: Including procedures, antibiotic start and stop dates in addition to other pertinent events     Interim History / Subjective:     Objective   Blood pressure (!) 144/66, pulse 66, temperature 98 F (36.7 C), temperature source Axillary, resp. rate 19, height 5' 8"$  (1.727 m), weight 78 kg, SpO2 95 %.    FiO2 (%):  [50 %-60 %] 60 %   Intake/Output Summary (Last 24 hours) at 05/24/2022 1040 Last data filed at 05/24/2022 0500 Gross per 24 hour  Intake 243 ml  Output 1500 ml  Net -1257 ml   Filed Weights   05/22/22 1727 05/23/22 0500 05/24/22 0500  Weight: 78.7 kg 79.7 kg 78 kg    Examination: Blood pressure (!) 144/66, pulse 66, temperature 98 F (36.7 C), temperature source Axillary, resp. rate 19, height 5' 8"$  (1.727 m), weight 78 kg, SpO2 95 %. Gen:      No acute distress HEENT:  EOMI, sclera anicteric Neck:     No masses; no thyromegaly Lungs:    Bibasal crackles CV:         Regular rate and rhythm; no murmurs Abd:      + bowel sounds; soft,  non-tender; no palpable masses, no distension Ext:    No edema; adequate peripheral perfusion Skin:      Warm and dry; no rash Neuro: alert and oriented x 3 Psych: normal mood and affect   Labs/imaging personally reviewed Significant for BUN/creatinine 66/3.01 Hemoglobin 11.3, platelets 166 Chest x-ray reviewed with chronic interstitial lung disease with increased basal thickening.  Resolved Hospital Problem list     Assessment & Plan:  Acute on chronic hypoxic respiratory failure Baseline IPF. Suspect he has an acute flare up Continue supportive care.  Agree with steroids Check Pct and start antibiotics if elevated Wean down oxygen as tolerated  AKI on CKD Monitor urine output and labs.  Best Practice (right click and "Reselect all SmartList Selections" daily)   Per primary team.  Signature:    Marshell Garfinkel MD [facility] See Amion for pager  If no response to pager , please call [phone] until 7pm After 7:00 pm call Elink  [phone] 05/24/2022, 10:40 AM

## 2022-05-24 NOTE — Progress Notes (Signed)
[facility]  PROGRESS NOTE  Bradley Johns [id number] DOB: Apr 13, 1934 DOA: 05/22/2022 PCP: Colon Branch, MD   Brief HPI:   88 year old male with medical history of COPD/pulmonary fibrosis, chronic hypoxic respiratory failure on 2 to 5 L of oxygen via nasal cannula, CKD stage IV, HFpEF, diabetes mellitus type 2, hypertension, hyperlipidemia, BPH, bladder cancer s/p TURBT, OSA not on CPAP was brought to ED for hypotension. Patient was in nephrologist office when he stood up from sitting position, became lightheaded and fell to ground.  Blood pressure was 98/40.  He was brought to hospital.  In the ED SpO2 83% on 5 L of oxygen placed on HFNC 30 L.  BNP 1603. Patient was given IV Solu-Medrol, IV Lasix and DuoNeb treatments.    Subjective   Patient seen and examined, denies shortness of breath.  Continues to require 25 L/min oxygen HFNC, 60% FiO2   Assessment/Plan:   Acute on chronic hypoxemic respiratory failure -At baseline requires 3 to 5 L of oxygen via [place] -He was found to be hypoxemic with SpO2 83% on 5 L -Initially required 30 L via HFNC, changed to 25 L/min -Started on Solu-Medrol 120 mg IV daily -Continue DuoNebs scheduled every 6 hours -Will consult pulmonology for further recommendations  Acute kidney injury on CKD stage IV -Creatinine is up to 3.01 after he received Lasix yesterday -Follow BMP in am -Will obtain renal ultrasound  Orthostatic hypotension -Patient had episode of orthostasis at the nephrologist office -Clonidine, losartan and amlodipine are on hold -Continue Coreg -Check orthostatic vital signs every 4 hours x 3  Heart failure with preserved ejection fraction -BNP 1600, chest x-ray suggestive of mild interstitial edema -Was given 1 dose of Lasix 40 mg IV yesterday -Continue Coreg -Losartan on hold as above  Diabetes mellitus type 2 -Last hemoglobin A1c 5.6 -Continue sliding scale insulin with NovoLog -CBG  well-controlled  Hypertension -Blood pressure is stable -Continue Coreg -Will restart clonidine 0.1 twice daily to avoid rebound hypertension -Continue to hold losartan, amlodipine  Hyperlipidemia -Continue atorvastatin  BPH -Continue doxazosin  OSA -Patient not on CPAP at home -Continue supplemental oxygen  Hyperkalemia -Resolved after patient was given 1 dose of Lokelma yesterday -Potassium is 4.6 today    Medications     atorvastatin  80 mg Oral Daily   carvedilol  12.5 mg Oral BID WC   cloNIDine  0.1 mg Oral BID   doxazosin  4 mg Oral QHS   heparin  5,000 Units Subcutaneous Q8H   insulin aspart  0-9 Units Subcutaneous TID WC   ipratropium-albuterol  3 mL Nebulization Q6H   methylPREDNISolone (SOLU-MEDROL) injection  120 mg Intravenous Q24H   sodium chloride flush  3 mL Intravenous Q12H     Data Reviewed:   CBG:  Recent Labs  Lab 05/23/22 1216 05/23/22 1713 05/23/22 2118 05/24/22 0746 05/24/22 1226  GLUCAP 128* 203* 143* 139* 221*    SpO2: 95 % O2 Flow Rate (L/min): 25 L/min FiO2 (%): 60 %    Vitals:   05/24/22 0837 05/24/22 0912 05/24/22 0947 05/24/22 1100  BP:    117/62  Pulse: 61 66    Resp:  19    Temp:    97.7 F (36.5 C)  TempSrc:    Oral  SpO2:  99% 95%   Weight:      Height:          Data Reviewed:  Basic Metabolic Panel: Recent Labs  Lab 05/22/22 1738 05/22/22 2155 05/23/22 [id number]  05/24/22 0421  NA 135 137 136 136  K 5.2* 5.6* 5.3* 4.6  CL 104  --  98 99  CO2 26  --  24 26  GLUCOSE 118*  --  157* 130*  BUN 48*  --  52* 66*  CREATININE 2.95*  --  2.79* 3.01*  CALCIUM 8.8*  --  9.3 8.6*  MG  --   --  2.0  --     CBC: Recent Labs  Lab 05/22/22 1738 05/22/22 2155 05/23/22 0657  WBC 5.7  --  5.0  HGB 10.8* 10.9* 11.3*  HCT 35.0* 32.0* 34.7*  MCV 100.6*  --  96.4  PLT 157  --  166    LFT Recent Labs  Lab 05/22/22 1740  AST 28  ALT 24  ALKPHOS 75  BILITOT 0.7  PROT 6.7  ALBUMIN 3.4*      Antibiotics: Anti-infectives (From admission, onward)    None        DVT prophylaxis: Heparin  Code Status: DNR  Family Communication: No family at bedside   CONSULTS    Objective    Physical Examination:  Appears in no acute distress Lungs: Decreased breath sounds bilaterally Abdomen is soft, nontender, no organomegaly No edema in the lower extremities   Status is: Inpatient:             Bradley Johns   [facility] If 7PM-7AM, please contact night-coverage at [email or web address], Office  [phone]   05/24/2022, 2:41 PM  LOS: 2 days

## 2022-05-25 DIAGNOSIS — E119 Type 2 diabetes mellitus without complications: Secondary | ICD-10-CM | POA: Diagnosis not present

## 2022-05-25 DIAGNOSIS — I5031 Acute diastolic (congestive) heart failure: Secondary | ICD-10-CM | POA: Diagnosis not present

## 2022-05-25 DIAGNOSIS — J9621 Acute and chronic respiratory failure with hypoxia: Secondary | ICD-10-CM | POA: Diagnosis not present

## 2022-05-25 DIAGNOSIS — J439 Emphysema, unspecified: Secondary | ICD-10-CM | POA: Diagnosis not present

## 2022-05-25 LAB — BASIC METABOLIC PANEL
Anion gap: 7 (ref 5–15)
BUN: 65 mg/dL — ABNORMAL HIGH (ref 8–23)
CO2: 28 mmol/L (ref 22–32)
Calcium: 8.5 mg/dL — ABNORMAL LOW (ref 8.9–10.3)
Chloride: 99 mmol/L (ref 98–111)
Creatinine, Ser: 2.31 mg/dL — ABNORMAL HIGH (ref 0.61–1.24)
GFR, Estimated: 27 mL/min — ABNORMAL LOW (ref >60)
Glucose, Bld: 192 mg/dL — ABNORMAL HIGH (ref 70–99)
Potassium: 4.5 mmol/L (ref 3.5–5.1)
Sodium: 134 mmol/L — ABNORMAL LOW (ref 135–145)

## 2022-05-25 LAB — CBC
HCT: 32.7 % — ABNORMAL LOW (ref 39.0–52.0)
Hemoglobin: 10.6 g/dL — ABNORMAL LOW (ref 13.0–17.0)
MCH: 31.9 pg (ref 26.0–34.0)
MCHC: 32.4 g/dL (ref 30.0–36.0)
MCV: 98.5 fL (ref 80.0–100.0)
Platelets: 163 10*3/uL (ref 150–400)
RBC: 3.32 MIL/uL — ABNORMAL LOW (ref 4.22–5.81)
RDW: 14.4 % (ref 11.5–15.5)
WBC: 10.7 10*3/uL — ABNORMAL HIGH (ref 4.0–10.5)
nRBC: 0 % (ref 0.0–0.2)

## 2022-05-25 LAB — GLUCOSE, CAPILLARY
Glucose-Capillary: 137 mg/dL — ABNORMAL HIGH (ref 70–99)
Glucose-Capillary: 215 mg/dL — ABNORMAL HIGH (ref 70–99)
Glucose-Capillary: 133 mg/dL — ABNORMAL HIGH (ref 70–99)
Glucose-Capillary: 237 mg/dL — ABNORMAL HIGH (ref 70–99)

## 2022-05-25 LAB — PROCALCITONIN: Procalcitonin: <0.1 ng/mL

## 2022-05-25 NOTE — Progress Notes (Signed)
[facility]  PROGRESS NOTE  Bradley Johns [id number] DOB: 07-24-1934 DOA: 05/22/2022 PCP: Colon Branch, MD   Brief HPI:   88 year old male with medical history of COPD/pulmonary fibrosis, chronic hypoxic respiratory failure on 2 to 5 L of oxygen via nasal cannula, CKD stage IV, HFpEF, diabetes mellitus type 2, hypertension, hyperlipidemia, BPH, bladder cancer s/p TURBT, OSA not on CPAP was brought to ED for hypotension. Patient was in nephrologist office when he stood up from sitting position, became lightheaded and fell to ground.  Blood pressure was 98/40.  He was brought to hospital.  In the ED SpO2 83% on 5 L of oxygen placed on HFNC 30 L.  BNP 1603. Patient was given IV Solu-Medrol, IV Lasix and DuoNeb treatments.    Subjective   Patient seen and examined, breathing is improved.   Assessment/Plan:   Acute on chronic hypoxemic respiratory failure -At baseline requires 3 to 5 L of oxygen via [place] -He was found to be hypoxemic with SpO2 83% on 5 L -Initially required 30 L via HFNC, weaned down to 15 L via HFNC -Started on Solu-Medrol 120 mg IV daily -Continue DuoNebs scheduled every 6 hours -Pulmonology consulted, recommend to continue with steroids and DuoNebs. -Check procalcitonin and start antibiotics if needed. -Wean off  oxygen as tolerated   Acute kidney injury on CKD stage IV -Creatinine is up to 3.01 after he received Lasix yesterday -Follow BMP in am -Will obtain renal ultrasound  Orthostatic hypotension -Patient had episode of orthostasis at the nephrologist office -Clonidine, losartan and amlodipine are on hold -Continue Coreg -Check orthostatic vital signs every 4 hours x 3  Heart failure with preserved ejection fraction -BNP 1600, chest x-ray suggestive of mild interstitial edema -Was given 1 dose of Lasix 40 mg IV yesterday -Continue Coreg -Losartan on hold as above  Diabetes mellitus type 2 -Last hemoglobin A1c 5.6 -Continue sliding scale  insulin with NovoLog -CBG well-controlled  Hypertension -Blood pressure is stable -Continue Coreg -Will restart clonidine 0.1 twice daily to avoid rebound hypertension -Continue to hold losartan, amlodipine  Hyperlipidemia -Continue atorvastatin  BPH -Continue doxazosin  OSA -Patient not on CPAP at home -Continue supplemental oxygen  Hyperkalemia -Resolved after patient was given 1 dose of Lokelma yesterday -Potassium is 4.6 today    Medications     atorvastatin  80 mg Oral Daily   carvedilol  12.5 mg Oral BID WC   cloNIDine  0.1 mg Oral BID   doxazosin  4 mg Oral QHS   heparin  5,000 Units Subcutaneous Q8H   insulin aspart  0-9 Units Subcutaneous TID WC   ipratropium-albuterol  3 mL Nebulization Q6H   methylPREDNISolone (SOLU-MEDROL) injection  120 mg Intravenous Q24H   sodium chloride flush  3 mL Intravenous Q12H     Data Reviewed:   CBG:  Recent Labs  Lab 05/24/22 0746 05/24/22 1226 05/24/22 1623 05/24/22 2200 05/25/22 0753  GLUCAP 139* 221* 173* 182* 137*    SpO2: 95 % O2 Flow Rate (L/min): 15 L/min FiO2 (%): (S) 50 % (Found on 55% - Decreased to 50% based on SpO2.)    Vitals:   05/25/22 0126 05/25/22 0524 05/25/22 0743 05/25/22 0754  BP:  (!) 147/57    Pulse: (!) 49 (!) 46  64  Resp: 20 15    Temp:  97.6 F (36.4 C)  97.6 F (36.4 C)  TempSrc:  Oral  Oral  SpO2:  97% 95%   Weight:  Height:          Data Reviewed:  Basic Metabolic Panel: Recent Labs  Lab 05/22/22 1738 05/22/22 2155 05/23/22 0657 05/24/22 0421 05/25/22 0410  NA 135 137 136 136 134*  K 5.2* 5.6* 5.3* 4.6 4.5  CL 104  --  98 99 99  CO2 26  --  24 26 28  $ GLUCOSE 118*  --  157* 130* 192*  BUN 48*  --  52* 66* 65*  CREATININE 2.95*  --  2.79* 3.01* 2.31*  CALCIUM 8.8*  --  9.3 8.6* 8.5*  MG  --   --  2.0  --   --     CBC: Recent Labs  Lab 05/22/22 1738 05/22/22 2155 05/23/22 0657 05/25/22 0410  WBC 5.7  --  5.0 10.7*  HGB 10.8* 10.9* 11.3* 10.6*   HCT 35.0* 32.0* 34.7* 32.7*  MCV 100.6*  --  96.4 98.5  PLT 157  --  166 163    LFT Recent Labs  Lab 05/22/22 1740  AST 28  ALT 24  ALKPHOS 75  BILITOT 0.7  PROT 6.7  ALBUMIN 3.4*     Antibiotics: Anti-infectives (From admission, onward)    None        DVT prophylaxis: Heparin  Code Status: DNR  Family Communication: No family at bedside   CONSULTS    Objective    Physical Examination:  Appears in no acute distress S1-S2, regular, no murmur auscultated Lungs: Crackles bilaterally Abdomen is soft, nontender, no organomegaly   Status is: Inpatient:             Bradley Johns   [facility] If 7PM-7AM, please contact night-coverage at [email or web address], Office  [phone]   05/25/2022, 10:30 AM  LOS: 3 days

## 2022-05-25 NOTE — Progress Notes (Signed)
   NAME:  Bradley Johns, MRN:  [id number], DOB:  May 06, 1934, LOS: 3 ADMISSION DATE:  05/22/2022, CONSULTATION DATE:  05/24/2022 REFERRING MD:  Georgiann Mohs MD, CHIEF COMPLAINT:   Acute hypoxic respiratory failure  History of Present Illness:   88 year old with history of COPD, IPF, chronic respiratory failure on 2 to 5 L oxygen, CKD, HFpEF, diabetes, hypertension, hyperlipidemia, OSA [noncompliant] Admitted with hypotension, dizziness, acute on chronic hypoxic respiratory failure requiring high flow nasal cannula.  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   Significant Hospital Events: Including procedures, antibiotic start and stop dates in addition to other pertinent events   2/15-admit  Interim History / Subjective:   Feels breathing is little better.  Continues on high flow nasal cannula  Objective   Blood pressure (!) 147/57, pulse 64, temperature 97.6 F (36.4 C), temperature source Oral, resp. rate 15, height 5' 8"$  (1.727 m), weight 78 kg, SpO2 95 %.    FiO2 (%):  [45 %-50 %] 50 %   Intake/Output Summary (Last 24 hours) at 05/25/2022 1336 Last data filed at 05/24/2022 2142 Gross per 24 hour  Intake --  Output 900 ml  Net -900 ml   Filed Weights   05/22/22 1727 05/23/22 0500 05/24/22 0500  Weight: 78.7 kg 79.7 kg 78 kg    Examination: Blood pressure (!) 147/57, pulse 64, temperature 97.6 F (36.4 C), temperature source Oral, resp. rate 15, height 5' 8"$  (1.727 m), weight 78 kg, SpO2 95 %. Gen:      No acute distress HEENT:  EOMI, sclera anicteric Neck:     No masses; no thyromegaly Lungs:    Bibasal crackles CV:          Regular rate and rhythm; no murmurs Abd:      + bowel sounds; soft, non-tender; no palpable masses, no distension Ext:    No edema; adequate peripheral perfusion Skin:      Warm and dry; no rash Neuro: alert and oriented x 3 Psych: normal mood and affect   Resolved Hospital Problem list     Assessment & Plan:  Acute on chronic hypoxic respiratory failure Baseline IPF. Suspect he has an acute flare up Continue supportive care.   Solu-Medrol 120 mg a day Observe off antibiotics.  PCT is negative Wean down oxygen as tolerated  AKI on CKD Monitor urine output and labs.  Best Practice (right click and "Reselect all SmartList Selections" daily)   Per primary team.  Signature:    Marshell Garfinkel MD [facility] See Amion for pager  If no response to pager , please call [phone] until 7pm After 7:00 pm call Elink  [phone] 05/25/2022, 1:36 PM

## 2022-05-26 DIAGNOSIS — E119 Type 2 diabetes mellitus without complications: Secondary | ICD-10-CM | POA: Diagnosis not present

## 2022-05-26 DIAGNOSIS — I5031 Acute diastolic (congestive) heart failure: Secondary | ICD-10-CM | POA: Diagnosis not present

## 2022-05-26 DIAGNOSIS — J9621 Acute and chronic respiratory failure with hypoxia: Secondary | ICD-10-CM | POA: Diagnosis not present

## 2022-05-26 DIAGNOSIS — J439 Emphysema, unspecified: Secondary | ICD-10-CM | POA: Diagnosis not present

## 2022-05-26 LAB — GLUCOSE, CAPILLARY
Glucose-Capillary: 222 mg/dL — ABNORMAL HIGH (ref 70–99)
Glucose-Capillary: 294 mg/dL — ABNORMAL HIGH (ref 70–99)

## 2022-05-26 MED ORDER — IPRATROPIUM-ALBUTEROL 0.5-2.5 (3) MG/3ML IN SOLN
3.0000 mL | Freq: Two times a day (BID) | RESPIRATORY_TRACT | Status: DC
  Administered 2022-05-26 – 2022-05-28 (×4): 3 mL via RESPIRATORY_TRACT
  Filled 2022-05-26 (×4): qty 3

## 2022-05-26 MED ORDER — FUROSEMIDE 40 MG PO TABS
40.0000 mg | ORAL_TABLET | Freq: Every day | ORAL | Status: DC
  Administered 2022-05-26 – 2022-05-28 (×3): 40 mg via ORAL
  Filled 2022-05-26 (×3): qty 1

## 2022-05-26 MED ORDER — ALBUTEROL SULFATE (2.5 MG/3ML) 0.083% IN NEBU: 2.5000 mg | INHALATION_SOLUTION | Freq: Four times a day (QID) | RESPIRATORY_TRACT | Status: DC | PRN

## 2022-05-26 NOTE — Care Management Important Message (Signed)
Important Message  Patient Details  Name: Bradley Johns MRN: [id number] Date of Birth: 1934-08-08   Medicare Important Message Given:  Yes     [name] [address] 05/26/2022, 11:33 AM

## 2022-05-26 NOTE — Progress Notes (Signed)
NAME:  Bradley Johns, MRN:  [id number], DOB:  1934-09-05, LOS: 4 ADMISSION DATE:  05/22/2022, CONSULTATION DATE:  05/24/2022 REFERRING MD:  Georgiann Mohs MD, CHIEF COMPLAINT:   Acute hypoxic respiratory failure  History of Present Illness:   88 year old with history of COPD, IPF, chronic respiratory failure on 2 to 5 L oxygen, CKD, HFpEF, diabetes, hypertension, hyperlipidemia, OSA [noncompliant] Admitted with hypotension, dizziness, acute on chronic hypoxic respiratory failure requiring high flow nasal cannula.  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   Significant Hospital Events: Including procedures, antibiotic start and stop dates in addition to other pertinent events   2/15-admit 2/19 States he still has some dyspnea , now on 15L HFNC ( 30%)  Interim History / Subjective:  Feels better, is still not at baseline Oxygen has been weaned from 30 L to 15 L  Sats 96-98% today, no distress while laying in bed   Objective   Blood pressure (!) 152/64, pulse (!) 54, temperature 97.6 F (36.4 C), temperature source Oral, resp. rate 14, height 5' 8"$  (1.727 m), weight 78.3 kg, SpO2 98 %.    FiO2 (%):  [30 %-40 %] 30 %   Intake/Output Summary (Last 24 hours) at 05/26/2022 1125 Last data filed at 05/26/2022 0519 Gross per 24 hour  Intake --  Output 1500 ml  Net -1500 ml   Filed Weights   05/23/22 0500 05/24/22 0500 05/26/22 0500  Weight: 79.7 kg 78 kg 78.3 kg    Examination: Blood pressure (!) 147/57, pulse 64, temperature 97.6 F (36.4 C), temperature source Oral, resp. rate 15, height 5' 8"$  (1.727 m), weight 78 kg, SpO2 95  %. Gen:      No acute distress, on side in bed  HEENT:  EOMI, sclera anicteric Neck:     No masses; no thyromegaly Lungs:    Bilateral chest excursion,  Bibasilar crackles CV:         Regular rate and rhythm; no murmurs Abd:      + bowel sounds; soft, non-tender; no palpable masses, no distension Ext:    No edema; adequate peripheral perfusion Skin:      Warm and dry; no rash Neuro: alert and oriented x 3, asking appropriate questions  Psych: normal mood and affect   Resolved Hospital Problem list     Assessment & Plan:  Acute on chronic hypoxic respiratory failure Baseline IPF. Suspect he has an acute flare up Oxygen has been weaned from 30 L  HFNC to to 15 L HFNC , so gradual improvement  Continue supportive care.   Solu-Medrol 120 mg a day Continue scheduled Duo Nebs Q 6 hours Observe off antibiotics.  PCT is negative Wean down oxygen as tolerated CXR prn  AKI on CKD Monitor urine output and labs. Correct electrolyte abnormalities  Best Practice (right click and "Reselect all SmartList Selections" daily)   Per primary team.  Signature:    Magdalen Spatz, MSN, AGACNP-BC [place] for personal pager PCCM on call pager [phone]  [facility] See Amion for pager  If no response to pager , please call [phone] until 7pm After 7:00 pm call Elink  [phone]  05/26/2022, 11:25 AM

## 2022-05-26 NOTE — Progress Notes (Signed)
Pt has PRN Bipap orders, no distress noted at this time.

## 2022-05-26 NOTE — Progress Notes (Signed)
[facility]  PROGRESS NOTE  Bradley Johns [id number] DOB: [birth year] DOA: 05/22/2022 PCP: Colon Branch, MD   Brief HPI:   88 year old male with medical history of COPD/pulmonary fibrosis, chronic hypoxic respiratory failure on 2 to 5 L of oxygen via nasal cannula, CKD stage IV, HFpEF, diabetes mellitus type 2, hypertension, hyperlipidemia, BPH, bladder cancer s/p TURBT, OSA not on CPAP was brought to ED for hypotension. Patient was in nephrologist office when he stood up from sitting position, became lightheaded and fell to ground.  Blood pressure was 98/40.  He was brought to hospital.  In the ED SpO2 83% on 5 L of oxygen placed on HFNC 30 L.  BNP 1603. Patient was given IV Solu-Medrol, IV Lasix and DuoNeb treatments.    Subjective   Patient seen and examined, breathing is improved.   Assessment/Plan:   Acute on chronic hypoxemic respiratory failure -At baseline requires 3 to 5 L of oxygen via [place] -He was found to be hypoxemic with SpO2 83% on 5 L -Initially required 30 L via HFNC, weaned down to 15 L via HFNC -Started on Solu-Medrol 120 mg IV daily -Continue DuoNebs scheduled every 6 hours -Pulmonology consulted, recommend to continue with steroids and DuoNebs. -Checked procalcitonin; <0.10, no antibiotics needed. -Wean off  oxygen as tolerated    Acute kidney injury on CKD stage IV -Creatinine is 2.31 today , improved from 3.01 yesterday -Renal ultrasound is unremarkable -Started on Lasix 20 mg p.o. daily  Orthostatic hypotension -Patient had episode of orthostasis at the nephrologist office -Clonidine, losartan and amlodipine are on hold -Continue Coreg -Check orthostatic vital signs every 4 hours x 3  Heart failure with preserved ejection fraction -BNP 1600, chest x-ray suggestive of mild interstitial edema -Was given 1 dose of Lasix 40 mg IV yesterday -Continue Coreg -Losartan on hold as above --Start Lasix 20 mg p.o. daily  Diabetes mellitus type  2 -Last hemoglobin A1c 5.6 -Continue sliding scale insulin with NovoLog -CBG well-controlled  Hypertension -Blood pressure is stable -Continue Coreg -Will restart clonidine 0.1 twice daily to avoid rebound hypertension -Continue to hold losartan, amlodipine  Hyperlipidemia -Continue atorvastatin  BPH -Continue doxazosin  OSA -Patient not on CPAP at home -Continue supplemental oxygen  Hyperkalemia -Resolved after patient was given 1 dose of Lokelma yesterday -Potassium is 4.6 today    Medications     atorvastatin  80 mg Oral Daily   carvedilol  12.5 mg Oral BID WC   cloNIDine  0.1 mg Oral BID   doxazosin  4 mg Oral QHS   heparin  5,000 Units Subcutaneous Q8H   insulin aspart  0-9 Units Subcutaneous TID WC   ipratropium-albuterol  3 mL Nebulization Q6H   methylPREDNISolone (SOLU-MEDROL) injection  120 mg Intravenous Q24H   sodium chloride flush  3 mL Intravenous Q12H     Data Reviewed:   CBG:  Recent Labs  Lab 05/24/22 2200 05/25/22 0753 05/25/22 1205 05/25/22 1606 05/25/22 2204  GLUCAP 182* 137* 215* 133* 237*    SpO2: 98 % O2 Flow Rate (L/min): 15 L/min FiO2 (%): (S) 30 % (Decreased to 30% based on SpO2.)    Vitals:   05/26/22 0405 05/26/22 0500 05/26/22 0750 05/26/22 0810  BP: 138/88  (!) 152/64   Pulse: (!) 54     Resp: 18   14  Temp: 97.6 F (36.4 C)     TempSrc: Oral     SpO2: 100%   98%  Weight:  78.3 kg  Height:          Data Reviewed:  Basic Metabolic Panel: Recent Labs  Lab 05/22/22 1738 05/22/22 2155 05/23/22 0657 05/24/22 0421 05/25/22 0410  NA 135 137 136 136 134*  K 5.2* 5.6* 5.3* 4.6 4.5  CL 104  --  98 99 99  CO2 26  --  24 26 28  $ GLUCOSE 118*  --  157* 130* 192*  BUN 48*  --  52* 66* 65*  CREATININE 2.95*  --  2.79* 3.01* 2.31*  CALCIUM 8.8*  --  9.3 8.6* 8.5*  MG  --   --  2.0  --   --     CBC: Recent Labs  Lab 05/22/22 1738 05/22/22 2155 05/23/22 0657 05/25/22 0410  WBC 5.7  --  5.0 10.7*  HGB  10.8* 10.9* 11.3* 10.6*  HCT 35.0* 32.0* 34.7* 32.7*  MCV 100.6*  --  96.4 98.5  PLT 157  --  166 163    LFT Recent Labs  Lab 05/22/22 1740  AST 28  ALT 24  ALKPHOS 75  BILITOT 0.7  PROT 6.7  ALBUMIN 3.4*     Antibiotics: Anti-infectives (From admission, onward)    None        DVT prophylaxis: Heparin  Code Status: DNR  Family Communication: No family at bedside   CONSULTS    Objective    Physical Examination:  Appears in no acute distress S1-S2, regular, no murmur auscultated Lungs-bibasilar crackles Abdomen is soft, nontender, organomegaly   Status is: Inpatient:             Bradley Johns   [facility] If 7PM-7AM, please contact night-coverage at [email or web address], Office  [phone]   05/26/2022, 8:21 AM  LOS: 4 days

## 2022-05-27 ENCOUNTER — Inpatient Hospital Stay (HOSPITAL_COMMUNITY): Payer: Medicare HMO

## 2022-05-27 DIAGNOSIS — I152 Hypertension secondary to endocrine disorders: Secondary | ICD-10-CM

## 2022-05-27 DIAGNOSIS — J9621 Acute and chronic respiratory failure with hypoxia: Secondary | ICD-10-CM | POA: Diagnosis not present

## 2022-05-27 DIAGNOSIS — E1159 Type 2 diabetes mellitus with other circulatory complications: Secondary | ICD-10-CM | POA: Diagnosis not present

## 2022-05-27 DIAGNOSIS — J8410 Pulmonary fibrosis, unspecified: Secondary | ICD-10-CM

## 2022-05-27 DIAGNOSIS — I5031 Acute diastolic (congestive) heart failure: Secondary | ICD-10-CM | POA: Diagnosis not present

## 2022-05-27 LAB — BASIC METABOLIC PANEL
Anion gap: 7 (ref 5–15)
BUN: 54 mg/dL — ABNORMAL HIGH (ref 8–23)
CO2: 27 mmol/L (ref 22–32)
Calcium: 8.3 mg/dL — ABNORMAL LOW (ref 8.9–10.3)
Chloride: 103 mmol/L (ref 98–111)
Creatinine, Ser: 2.04 mg/dL — ABNORMAL HIGH (ref 0.61–1.24)
GFR, Estimated: 31 mL/min — ABNORMAL LOW (ref >60)
Glucose, Bld: 174 mg/dL — ABNORMAL HIGH (ref 70–99)
Potassium: 4.2 mmol/L (ref 3.5–5.1)
Sodium: 137 mmol/L (ref 135–145)

## 2022-05-27 LAB — GLUCOSE, CAPILLARY
Glucose-Capillary: 371 mg/dL — ABNORMAL HIGH (ref 70–99)
Glucose-Capillary: 196 mg/dL — ABNORMAL HIGH (ref 70–99)
Glucose-Capillary: 198 mg/dL — ABNORMAL HIGH (ref 70–99)

## 2022-05-27 MED ORDER — PREDNISONE 20 MG PO TABS
40.0000 mg | ORAL_TABLET | Freq: Every day | ORAL | Status: DC
  Administered 2022-05-28: 40 mg via ORAL
  Filled 2022-05-27: qty 2

## 2022-05-27 NOTE — TOC Initial Note (Signed)
Transition of Care [facility]) - Initial/Assessment Note    Patient Details  Name: Bradley Johns MRN: [id number] Date of Birth: 1934-08-21  Transition of Care [facility]) CM/SW Contact:    Levonne Lapping, RN Phone Number: 05/27/2022, 2:09 PM  Clinical Narrative:      CM spoke with Patient bedside . He is from Home with Wife. States he has a 2 story home and there is approximately 12-14 steps going up to their bedroom area. Also states that they have been approved for a stair lift and will pursue that. HH PT and OT have been recommended . [name] is choice and information has been sent to liaison , Tommi Rumps. Patient is requesting a larger portable and concentrator- Up to 10 L. States that his Pulmonologist, Dr Larey Days at [place] [facility]  was recommending. [employer] liaison has been contacted . Sat will need to be taken and a new order signed  TOC will continue to follow patient for any additional discharge needs     Expected Discharge Plan: [place] Barriers to Discharge: Continued Medical Work up   Patient Goals and CMS Choice Patient states their goals for this hospitalization and ongoing recovery are:: Go Home CMS Medicare.gov Compare Post Acute Care list provided to:: Patient Choice offered to / list presented to : Patient      Expected Discharge Plan and Services   Discharge Planning Services: CM Consult Post Acute Care Choice: [place] arrangements for the past 2 months: [place] (House  is a 2 story house with a flight of stairs leading to bedroom.)                 DME Arranged: [place] rolling DME Agency: [employer] Date DME Agency Contacted: 05/27/22 Time DME Agency Contacted: [id number] Representative spoke with at DME Agency: Erasmo Downer HH Arranged: PT, OT [place] Agency: [place] Date [place]: 05/27/22 Time [place]: 39 Representative spoke with at [place]  Prior Living  Arrangements/Services Living arrangements for the past 2 months: [place] (House  is a 2 story house with a flight of stairs leading to bedroom.) Lives with:: Spouse Patient language and need for interpreter reviewed:: Yes Do you feel safe going back to the place where you live?: Yes      Need for Family Participation in Patient Care: Yes (Comment) Care giver support system in place?: Yes (comment)   Criminal Activity/Legal Involvement Pertinent to Current Situation/Hospitalization: No - Comment as needed  Activities of Daily Living   ADL Screening (condition at time of admission) Patient's cognitive ability adequate to safely complete daily activities?: Yes Is the patient deaf or have difficulty hearing?: No Does the patient have difficulty seeing, even when wearing glasses/contacts?: No Does the patient have difficulty concentrating, remembering, or making decisions?: No Patient able to express need for assistance with ADLs?: Yes Does the patient have difficulty dressing or bathing?: No Independently performs ADLs?: Yes (appropriate for developmental age) Does the patient have difficulty walking or climbing stairs?: No Weakness of Legs: None Weakness of Arms/Hands: None  Permission Sought/Granted Permission sought to share information with : Case Manager Permission granted to share information with : Yes, Verbal Permission Granted  Share Information with NAME: Daughter  Permission granted to share info w AGENCY: HH/DME        Emotional Assessment Appearance:: Appears younger than stated age Attitude/Demeanor/Rapport: Engaged, Gracious Affect (typically observed): Accepting Orientation: :  Oriented to Self, Oriented to Place, Oriented to  Time, Oriented to Situation Alcohol / Substance Use: Not Applicable Psych Involvement: No (comment)  Admission diagnosis:  Acute on chronic respiratory failure with hypoxia (HCC) [J96.21] Acute hypoxic respiratory failure (HCC)  [J96.01] Patient Active Problem List   Diagnosis Date Noted   (HFpEF) heart failure with preserved ejection fraction ([place]) 05/23/2022   Acute on chronic hypoxic respiratory failure ([place]) 05/22/2022   CKD (chronic kidney disease) stage 3, GFR 30-59 ml/min (HCC) 03/07/2021   Malignant neoplasm of overlapping sites of bladder ([place]) 11/07/2020   SOB (shortness of breath)    Acute on chronic respiratory failure with hypoxia ([place]) 08/02/2020   Iron deficiency anemia due to chronic blood loss 08/02/2020   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 12/30/2014   Pulmonary fibrosis ([place]) 12/14/2012   Cubital tunnel syndrome on left 11/14/2012   Dyspnea on exertion 10/11/2012   Trigger finger of both hands 09/26/2011   Annual physical exam 03/18/2011   BPH (benign prostatic hyperplasia) 07/11/2008   Osteoarthritis 04/18/2008   Hyperlipidemia associated with type 2 diabetes mellitus ([place]) 10/06/2006   Diabetes mellitus type 2 in nonobese ([place]) 04/29/2006   GOUT 04/29/2006   Hypertension associated with diabetes ([place]) 04/29/2006   COPD with emphysema, on O2 04/29/2006   Sleep apnea 04/29/2006   PCP:  Colon Branch, MD Pharmacy:   [facility] [id number] - [place], [place] - [address] [place] [address] Phone: [phone] Fax: [phone]  [facility] - [place], [place] [address] Phone: [phone] Fax: [phone]     Social Determinants of Health (SDOH) Social History: SDOH Screenings   Food Insecurity: No Food Insecurity (08/09/2021)  Housing: Low Risk  (08/09/2021)  Transportation Needs: No Transportation Needs (08/09/2021)  Alcohol Screen: Low Risk   (08/09/2021)  Depression (PHQ2-9): Low Risk  (08/09/2021)  Financial Resource Strain: Low Risk  (08/09/2021)  Physical Activity: Inactive (08/09/2021)  Social Connections: Socially Integrated (08/09/2021)  Stress: No Stress Concern Present (08/09/2021)  Tobacco Use: Medium Risk (05/22/2022)   SDOH Interventions:     Readmission Risk Interventions     No data to display

## 2022-05-27 NOTE — Inpatient Diabetes Management (Signed)
Inpatient Diabetes Program Recommendations  AACE/ADA: New Consensus Statement on Inpatient Glycemic Control (2015)  Target Ranges:  Prepandial:   less than 140 mg/dL      Peak postprandial:   less than 180 mg/dL (1-2 hours)      Critically ill patients:  140 - 180 mg/dL   Lab Results  Component Value Date   GLUCAP 371 (H) 05/27/2022   HGBA1C 5.6 02/20/2022    Review of Glycemic Control  Diabetes history: DM2 Outpatient Diabetes medications: Actos 45 units QD Current orders for Inpatient glycemic control: Novolog 0-9 units TID, Prednisone 40 mg QD  Inpatient Diabetes Program Recommendations:    Novolog 3 units TID with meals while receiving steroids.  Will continue to follow while inpatient.  Thank you, Reche Dixon, MSN, [place] Diabetes Coordinator Inpatient Diabetes Program [phone] (team pager from 8a-5p)

## 2022-05-27 NOTE — Progress Notes (Addendum)
PROGRESS NOTE        PATIENT DETAILS Name: Bradley Johns Age: 88 y.o. Sex: male Date of Birth: 1935/03/04 Admit Date: 05/22/2022 Admitting Physician Clance Boll, MD PCP:Paz, Alda Berthold, MD  Brief Summary: Patient is a 88 y.o.  male with history of IPF COPD with chronic hypoxic respiratory failure on home O2 who presented to the ED after he fell at the nephrologist office.  He apparently started feeling lightheaded when he stood up from a seated position and subsequently fell.  He was thought to have orthostatic hypotension as the mechanism for the fall, he was also found to have AKI and acute on chronic hypoxic respiratory failure due to IPF flare requiring heated high flow oxygen.  He was subsequently admitted to hospitalist service-started on steroids and other supportive care and has gradually improved with decreasing FiO2 requirements.    Significant events: 2/15>> admit to [facility]  Significant studies: 2/15>> CXR: Chronic lung disease with increased left> right interstitial thickening. 2/17>> renal ultrasound: No hydronephrosis.  Significant microbiology data: 2/15>> COVID/influenza/RSV PCR: Negative  Procedures: None  Consults: PCCM  Subjective: Lying comfortably in bed-denies any chest pain or shortness of breath.  Overall better-down to 3 L of HFNC this morning.  Objective: Vitals: Blood pressure (!) 160/72, pulse (!) 50, temperature 98 F (36.7 C), temperature source Oral, resp. rate 16, height 5' 8"$  (1.727 m), weight 81.3 kg, SpO2 97 %.   Exam: Gen Exam:Alert awake-not in any distress HEENT:atraumatic, normocephalic Chest: Bilateral inspiratory rales. CVS:S1S2 regular Abdomen:soft non tender, non distended Extremities:no edema Neurology: Non focal Skin: no rash  Pertinent Labs/Radiology:    Latest Ref Rng & Units 05/25/2022    4:10 AM 05/23/2022    6:57 AM 05/22/2022    9:55 PM  CBC  WBC 4.0 - 10.5 K/uL 10.7  5.0    Hemoglobin  13.0 - 17.0 g/dL 10.6  11.3  10.9   Hematocrit 39.0 - 52.0 % 32.7  34.7  32.0   Platelets 150 - 400 K/uL 163  166      Lab Results  Component Value Date   NA 137 05/27/2022   K 4.2 05/27/2022   CL 103 05/27/2022   CO2 27 05/27/2022      Assessment/Plan: Acute on chronic hypoxic respiratory failure secondary to pulmonary fibrosis flare Improving-FiO2 needs decreasing Down to 3 L of HFNC this morning Will need gradual steroid taper PCCM following [place] with PT/OT and see how he does  AKI on CKD 4 AKI likely hemodynamically mediated UA negative for proteinuria, renal ultrasound negative for hydronephrosis Creatinine gradually improving Avoid toxic agents Continue to trend creatinine daily.  Orthostatic hypotension Suspicion for orthostatic mechanism that led to a fall while at nephrologist clinic Check orthostatic  vital signs with PT/nursing staff today Allow some amount of permissive hypertension Continue Coreg/clonidine/Cardura/Lasix   COPD Not in exacerbation Continue bronchodilators  Chronic HFpEF Volume status stable Continue Lasix and maintain negative balance Continue Coreg Due to CKD/AKI-hold losartan until seen by outpatient nephrologist.  HTN BP on the higher side Reasonable to allow some permissive hypertension due to suspicion of orthostatic hypotension leading to a fall on admission Continue Coreg/Lasix/clonidine and Cardura  HLD Continue statin  BPH Doxazosin  OSA Not using CPAP at home  DM-2 (A1c 5.6 on 02/20/2022) CBGs stable on SSI Appears diet/lifestyle controlled at home  BMI: Estimated body mass index is 27.25 kg/m as calculated from the following:   Height as of this encounter: 5' 8"$  (1.727 m).   Weight as of this encounter: 81.3 kg.   Code status:   Code Status: DNR   DVT Prophylaxis: heparin injection 5,000 Units Start: 05/23/22 0145   Family Communication: None at bedside   Disposition Plan: Status is:  Inpatient Remains inpatient appropriate because: Severity of illness   Planned Discharge Destination:Home health next several days.   Diet: Diet Order             Diet regular Room service appropriate? Yes; Fluid consistency: Thin  Diet effective now                     Antimicrobial agents: Anti-infectives (From admission, onward)    None        MEDICATIONS: Scheduled Meds:  atorvastatin  80 mg Oral Daily   carvedilol  12.5 mg Oral BID WC   cloNIDine  0.1 mg Oral BID   doxazosin  4 mg Oral QHS   furosemide  40 mg Oral Daily   heparin  5,000 Units Subcutaneous Q8H   insulin aspart  0-9 Units Subcutaneous TID WC   ipratropium-albuterol  3 mL Nebulization BID   methylPREDNISolone (SOLU-MEDROL) injection  120 mg Intravenous Q24H   sodium chloride flush  3 mL Intravenous Q12H   Continuous Infusions: PRN Meds:.acetaminophen **OR** acetaminophen, albuterol, ondansetron **OR** ondansetron (ZOFRAN) IV, senna-docusate   I have personally reviewed following labs and imaging studies  LABORATORY DATA: CBC: Recent Labs  Lab 05/22/22 1738 05/22/22 2155 05/23/22 0657 05/25/22 0410  WBC 5.7  --  5.0 10.7*  HGB 10.8* 10.9* 11.3* 10.6*  HCT 35.0* 32.0* 34.7* 32.7*  MCV 100.6*  --  96.4 98.5  PLT 157  --  166 [id number]    Basic Metabolic Panel: Recent Labs  Lab 05/22/22 1738 05/22/22 2155 05/23/22 0657 05/24/22 0421 05/25/22 0410 05/27/22 0324  NA 135 137 136 136 134* 137  K 5.2* 5.6* 5.3* 4.6 4.5 4.2  CL 104  --  98 99 99 103  CO2 26  --  24 26 28 27  $ GLUCOSE 118*  --  157* 130* 192* 174*  BUN 48*  --  52* 66* 65* 54*  CREATININE 2.95*  --  2.79* 3.01* 2.31* 2.04*  CALCIUM 8.8*  --  9.3 8.6* 8.5* 8.3*  MG  --   --  2.0  --   --   --     GFR: Estimated Creatinine Clearance: 25.6 mL/min (A) (by C-G formula based on SCr of 2.04 mg/dL (H)).  Liver Function Tests: Recent Labs  Lab 05/22/22 1740  AST 28  ALT 24  ALKPHOS 75  BILITOT 0.7  PROT 6.7   ALBUMIN 3.4*   Recent Labs  Lab 05/22/22 1740  LIPASE 32   No results for input(s): "AMMONIA" in the last 168 hours.  Coagulation Profile: No results for input(s): "INR", "PROTIME" in the last 168 hours.  Cardiac Enzymes: No results for input(s): "CKTOTAL", "CKMB", "CKMBINDEX", "TROPONINI" in the last 168 hours.  BNP (last 3 results) No results for input(s): "PROBNP" in the last 8760 hours.  Lipid Profile: No results for input(s): "CHOL", "HDL", "LDLCALC", "TRIG", "CHOLHDL", "LDLDIRECT" in the last 72 hours.  Thyroid Function Tests: No results for input(s): "TSH", "T4TOTAL", "FREET4", "T3FREE", "THYROIDAB" in the last 72 hours.  Anemia Panel: No results for input(s): "VITAMINB12", "FOLATE", "FERRITIN", "TIBC", "IRON", "RETICCTPCT"  in the last 72 hours.  Urine analysis:    Component Value Date/Time   COLORURINE YELLOW 05/22/2022 2015   APPEARANCEUR CLEAR 05/22/2022 2015   LABSPEC 1.010 05/22/2022 2015   PHURINE 5.5 05/22/2022 2015   GLUCOSEU NEGATIVE 05/22/2022 2015   GLUCOSEU NEGATIVE 10/16/2014 1203   HGBUR TRACE (A) 05/22/2022 2015   HGBUR negative 10/06/2006 0756   BILIRUBINUR NEGATIVE 05/22/2022 2015   BILIRUBINUR negative 09/09/2017 1217   KETONESUR NEGATIVE 05/22/2022 2015   PROTEINUR NEGATIVE 05/22/2022 2015   UROBILINOGEN 0.2 09/09/2017 1217   UROBILINOGEN 1.0 10/16/2014 1203   NITRITE NEGATIVE 05/22/2022 2015   LEUKOCYTESUR NEGATIVE 05/22/2022 2015    Sepsis Labs: Lactic Acid, Venous No results found for: "LATICACIDVEN"  MICROBIOLOGY: Recent Results (from the past 240 hour(s))  Resp panel by RT-PCR (RSV, Flu A&B, Covid) Anterior Nasal Swab     Status: None   Collection Time: 05/22/22  5:38 PM   Specimen: Anterior Nasal Swab  Result Value Ref Range Status   SARS Coronavirus 2 by RT PCR NEGATIVE NEGATIVE Final    Comment: (NOTE) SARS-CoV-2 target nucleic acids are NOT DETECTED.  The SARS-CoV-2 RNA is generally detectable in upper  respiratory specimens during the acute phase of infection. The lowest concentration of SARS-CoV-2 viral copies this assay can detect is 138 copies/mL. A negative result does not preclude SARS-Cov-2 infection and should not be used as the sole basis for treatment or other patient management decisions. A negative result may occur with  improper specimen collection/handling, submission of specimen other than nasopharyngeal swab, presence of viral mutation(s) within the areas targeted by this assay, and inadequate number of viral copies(<138 copies/mL). A negative result must be combined with clinical observations, patient history, and epidemiological information. The expected result is Negative.  Fact Sheet for Patients:  EntrepreneurPulse.com.au  Fact Sheet for Healthcare Providers:  IncredibleEmployment.be  This test is no t yet approved or cleared by the [place] FDA and  has been authorized for detection and/or diagnosis of SARS-CoV-2 by FDA under an Emergency Use Authorization (EUA). This EUA will remain  in effect (meaning this test can be used) for the duration of the COVID-19 declaration under Section 564(b)(1) of the Act, 21 U.S.C.section 360bbb-3(b)(1), unless the authorization is terminated  or revoked sooner.       Influenza A by PCR NEGATIVE NEGATIVE Final   Influenza B by PCR NEGATIVE NEGATIVE Final    Comment: (NOTE) The Xpert Xpress SARS-CoV-2/FLU/RSV plus assay is intended as an aid in the diagnosis of influenza from Nasopharyngeal swab specimens and should not be used as a sole basis for treatment. Nasal washings and aspirates are unacceptable for Xpert Xpress SARS-CoV-2/FLU/RSV testing.  Fact Sheet for Patients: EntrepreneurPulse.com.au  Fact Sheet for Healthcare Providers: IncredibleEmployment.be  This test is not yet approved or cleared by the [place] FDA and has been  authorized for detection and/or diagnosis of SARS-CoV-2 by FDA under an Emergency Use Authorization (EUA). This EUA will remain in effect (meaning this test can be used) for the duration of the COVID-19 declaration under Section 564(b)(1) of the Act, 21 U.S.C. section 360bbb-3(b)(1), unless the authorization is terminated or revoked.     Resp Syncytial Virus by PCR NEGATIVE NEGATIVE Final    Comment: (NOTE) Fact Sheet for Patients: EntrepreneurPulse.com.au  Fact Sheet for Healthcare Providers: IncredibleEmployment.be  This test is not yet approved or cleared by the [place] FDA and has been authorized for detection and/or diagnosis of SARS-CoV-2 by FDA under an Emergency  Use Authorization (EUA). This EUA will remain in effect (meaning this test can be used) for the duration of the COVID-19 declaration under Section 564(b)(1) of the Act, 21 U.S.C. section 360bbb-3(b)(1), unless the authorization is terminated or revoked.  Performed at [facility], [address] [place], [place] [address]     RADIOLOGY STUDIES/RESULTS: No results found.   LOS: 5 days   Oren Binet, MD  [facility]    To contact the attending provider between 7A-7P or the covering provider during after hours 7P-7A, please log into the web site [email or web address] and access using universal [place] password for that web site. If you do not have the password, please call the hospital operator.  05/27/2022, 8:16 AM

## 2022-05-27 NOTE — Evaluation (Signed)
Occupational Therapy Evaluation Patient Details Name: Bradley Johns MRN: [id number] DOB: July 21, 1934 Today's Date: 05/27/2022   History of Present Illness Patient is a 88 y.o.  male with history of IPF COPD with chronic hypoxic respiratory failure on home O2 who presented to the ED after he fell at the nephrologist office.  He apparently started feeling lightheaded when he stood up from a seated position and subsequently fell.  He was thought to have orthostatic hypotension as the mechanism for the fall, he was also found to have AKI and acute on chronic hypoxic respiratory failure due to IPF flare requiring heated high flow oxygen.  He was subsequently admitted to hospitalist service-started on steroids and other supportive care and has gradually improved with decreasing FiO2 requirements.   Clinical Impression   Bradley Johns was evaluated s/p the above admission list. He is generally mod I and lives with his wife at baseline; per pt report he states ADLs and simple mobility are getting progressively harder for him due to SOB. Upon evaluation he was limited by orthostatic BP, SOB with activity, limited endurance and generalized weakness. Overall he required supervision A for bed mobility, min G for transfers and ambulation in the room with RW< and up to min G for ADLs. Pt benefitted from verbal cues throughout for energy conservation and safety. Pt will benefit from continued acute OT services. Recommend d/c to home with [place].  Orthostatic BP: Supine: 129/55 Sitting: 105/83 Sitting after 2 minutes: 128/62 Standing: 119/56     Recommendations for follow up therapy are one component of a multi-disciplinary discharge planning process, led by the attending physician.  Recommendations may be updated based on patient status, additional functional criteria and insurance authorization.   Follow Up Recommendations  Home health OT     Assistance Recommended at Discharge Intermittent Supervision/Assistance   Patient can return home with the following A little help with walking and/or transfers;A little help with bathing/dressing/bathroom;Assistance with cooking/housework;Assist for transportation;Help with stairs or ramp for entrance    Functional Status Assessment  Patient has had a recent decline in their functional status and demonstrates the ability to make significant improvements in function in a reasonable and predictable amount of time.  Equipment Recommendations  BSC/3in1    Recommendations for Other Services       Precautions / Restrictions Precautions Precautions: Fall Precaution Comments: watch BP Restrictions Weight Bearing Restrictions: No      Mobility Bed Mobility Overal bed mobility: Needs Assistance Bed Mobility: Supine to Sit, Sit to Supine     Supine to sit: Supervision Sit to supine: Supervision        Transfers Overall transfer level: Needs assistance Equipment used: Rolling walker (2 wheels) Transfers: Sit to/from Stand Sit to Stand: Min guard                  Balance Overall balance assessment: Needs assistance, History of Falls Sitting-balance support: Feet supported Sitting balance-Leahy Scale: Normal     Standing balance support: During functional activity, Reliant on assistive device for balance, Single extremity supported Standing balance-Leahy Scale: Fair                             ADL either performed or assessed with clinical judgement   ADL Overall ADL's : Needs assistance/impaired Eating/Feeding: Independent   Grooming: Supervision/safety;Standing   Upper Body Bathing: Set up;Sitting   Lower Body Bathing: Min guard;Sit to/from stand   Upper Body Dressing :  Set up;Sitting   Lower Body Dressing: Min guard;Sit to/from stand   Toilet Transfer: Ambulation;Rolling walker (2 wheels);Min guard   Toileting- Clothing Manipulation and Hygiene: Supervision/safety;Sitting/lateral lean       Functional mobility  during ADLs: Min guard;Rolling walker (2 wheels) General ADL Comments: limited by orthostatic BP once sitting EOB     Vision Baseline Vision/History: 0 No visual deficits Vision Assessment?: No apparent visual deficits     Perception Perception Perception Tested?: No   Praxis Praxis Praxis tested?: Not tested    Pertinent Vitals/Pain Pain Assessment Pain Assessment: No/denies pain     Hand Dominance Right   Extremity/Trunk Assessment Upper Extremity Assessment Upper Extremity Assessment: Generalized weakness   Lower Extremity Assessment Lower Extremity Assessment: Defer to PT evaluation   Cervical / Trunk Assessment Cervical / Trunk Assessment: Normal   Communication Communication Communication: No difficulties   Cognition Arousal/Alertness: Awake/alert Behavior During Therapy: WFL for tasks assessed/performed Overall Cognitive Status: Within Functional Limits for tasks assessed                                       General Comments  orthostatic BP upon sitting, recovered with prolonged sitting/grooming activity    Exercises     Shoulder Instructions      Home Living Family/patient expects to be discharged to:: Private residence Living Arrangements: Spouse/significant other Available Help at Discharge: Family;Available 24 hours/day Type of Home: House Home Access: Stairs to enter [employer] of Steps: 4 Entrance Stairs-Rails: Right Home Layout: Bed/bath upstairs;Two level Alternate Level Stairs-Number of Steps: 13, having chair lift installed Alternate Level Stairs-Rails: Left Bathroom Shower/Tub: Occupational psychologist: Standard     Home Equipment: Cane - single point   Additional Comments: in the process of getting a stair lift      Prior Functioning/Environment Prior Level of Function : Independent/Modified Independent             Mobility Comments: patient reports he ambulates with cane ADLs  Comments: mod I        OT Problem List: Decreased strength;Decreased range of motion;Decreased activity tolerance;Impaired balance (sitting and/or standing);Decreased safety awareness;Decreased knowledge of use of DME or AE;Decreased knowledge of precautions      OT Treatment/Interventions: Self-care/ADL training;Therapeutic exercise;DME and/or AE instruction;Therapeutic activities;Patient/family education;Balance training    OT Goals(Current goals can be found in the care plan section) Acute Rehab OT Goals Patient Stated Goal: to get some rest OT Goal Formulation: With patient Time For Goal Achievement: 06/10/22 Potential to Achieve Goals: Good ADL Goals Pt Will Perform Grooming: Independently Pt Will Perform Lower Body Dressing: Independently;sit to/from stand Pt Will Transfer to Toilet: ambulating;with modified independence Additional ADL Goal #1: Pt will tolerate at least 10 minutes of OOB functional activity with generalized supervision A  OT Frequency: Min 2X/week    Co-evaluation              AM-PAC OT "6 Clicks" Daily Activity     Outcome Measure Help from another person eating meals?: None Help from another person taking care of personal grooming?: A Little Help from another person toileting, which includes using toliet, bedpan, or urinal?: A Little Help from another person bathing (including washing, rinsing, drying)?: A Little Help from another person to put on and taking off regular upper body clothing?: None Help from another person to put on and taking off regular lower body clothing?:  A Little 6 Click Score: 20   End of Session Equipment Utilized During Treatment: Gait belt;Rolling walker (2 wheels) Nurse Communication: Mobility status  Activity Tolerance: Patient tolerated treatment well Patient left: in bed;with call bell/phone within reach;with bed alarm set  OT Visit Diagnosis: Unsteadiness on feet (R26.81);Other abnormalities of gait and mobility  (R26.89);Muscle weakness (generalized) (M62.81)                Time: [id number] OT Time Calculation (min): 20 min Charges:  OT General Charges $OT Visit: 1 Visit OT Evaluation $OT Eval Moderate Complexity: 1 Mod  Shade Flood, OTR/L Acute Rehabilitation Services Office [phone] Secure Chat Communication Preferred   Elliot Cousin 05/27/2022, 4:40 PM

## 2022-05-27 NOTE — Progress Notes (Signed)
NAME:  Bradley Johns, MRN:  [id number], DOB:  September 22, 1934, LOS: 5 ADMISSION DATE:  05/22/2022, CONSULTATION DATE:  05/24/2022 REFERRING MD:  Georgiann Mohs MD, CHIEF COMPLAINT:   Acute hypoxic respiratory failure  History of Present Illness:   88 year old with history of COPD, IPF, chronic respiratory failure on 2 to 5 L oxygen, CKD, HFpEF, diabetes, hypertension, hyperlipidemia, OSA [noncompliant] Admitted with hypotension, dizziness, acute on chronic hypoxic respiratory failure requiring high flow nasal cannula.  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   Significant Hospital Events: Including procedures, antibiotic start and stop dates in addition to other pertinent events   2/15-admit 2/19 States he still has some dyspnea , now on 15L HFNC ( 30%) 2/20 on 3L at rest  Interim History / Subjective:  Feeling slowly better, down to 3L. Still dyspneic.    Objective   Blood pressure (!) 110/49, pulse 68, temperature 97.7 F (36.5 C), temperature source Oral, resp. rate 20, height 5' 8"$  (1.727 m), weight 81.3 kg, SpO2 95 %.        Intake/Output Summary (Last 24 hours) at 05/27/2022 1219 Last data filed at 05/27/2022 1139 Gross per 24 hour  Intake 240 ml  Output 1650 ml  Net -1410 ml    Filed Weights   05/24/22 0500 05/26/22 0500 05/27/22 0500  Weight: 78 kg 78.3 kg 81.3 kg    Examination: BP (!) 110/49   Pulse 68   Temp 97.7 F (36.5 C) (Oral)   Resp 20   Ht 5' 8"$  (1.727 m)   Wt 81.3 kg   SpO2 95%   BMI 27.25 kg/m   Gen:      No acute distress, on side in bed  HEENT:  EOMI, sclera anicteric Neck:     No masses; no  thyromegaly Lungs:    Bilateral chest excursion,  Bibasilar crackles CV:         Regular rate and rhythm; no murmurs Abd:      + bowel sounds; soft, non-tender; no palpable masses, no distension Ext:    No edema; adequate peripheral perfusion Skin:      Warm and dry; no rash Neuro: alert and oriented x 3, asking appropriate questions  Psych: normal mood and affect   Resolved Hospital Problem list     Assessment & Plan:  Acute on chronic hypoxic respiratory failure Baseline IPF. Suspect he has an acute flare up Oxygen has been weaned from 30 L  HFNC to to 3 L Opal , so gradual improvement  Continue supportive care.   S/p Solu-Medrol 120 mg a day x 4 days, decrease to 40 mg for 7 days, 20 mg for 7 days, 10 mg for 7 days Continue scheduled Duo Nebs Q 6 hours PCT is negative Wean down oxygen as tolerated CXR prn Walk and document ambulatory O2 needs New order for home concentrator that can deliver 10L O2  Will attempt to arrange f/u with me in clinic in coming weeks  Best Practice (right click and "Reselect all SmartList Selections" daily)   Per primary team.  Signature:    Lanier Clam, MD [place] for personal contact info [facility] See Amion  for pager  If no response to pager , please call [phone] until 7pm After 7:00 pm call Elink  [phone] 05/27/2022, 12:19 PM

## 2022-05-27 NOTE — Progress Notes (Signed)
Pt has PRN Bipap orders, no distress noted at this time

## 2022-05-27 NOTE — Discharge Summary (Signed)
PATIENT DETAILS Name: Bradley Johns Age: 88 y.o. Sex: male Date of Birth: [birth year] MRN: [id number]. Admitting Physician: Clance Boll, MD PCP:Paz, Alda Berthold, MD  Admit Date: 05/22/2022 Discharge date: 05/28/2022  Recommendations for Outpatient Follow-up:  Follow up with PCP in 1-2 weeks Please obtain CMP/CBC in one week Please ensure follow up with Pulmonology  Admitted From:  Home  Disposition: Home health   Discharge Condition: fair  CODE STATUS:   Code Status: DNR   Diet recommendation:  Diet Order             Diet - low sodium heart healthy           Diet Carb Modified           Diet regular Room service appropriate? Yes; Fluid consistency: Thin  Diet effective now                    Brief Summary: Patient is a 88 y.o.  male with history of IPF COPD with chronic hypoxic respiratory failure on home O2 who presented to the ED after he fell at the nephrologist office.  He apparently started feeling lightheaded when he stood up from a seated position and subsequently fell.  He was thought to have orthostatic hypotension as the mechanism for the fall, he was also found to have AKI and acute on chronic hypoxic respiratory failure due to IPF flare requiring heated high flow oxygen.  He was subsequently admitted to hospitalist service-started on steroids and other supportive care and has gradually improved with decreasing FiO2 requirements.     Significant events: 2/15>> admit to [facility]   Significant studies: 2/15>> CXR: Chronic lung disease with increased left> right interstitial thickening. 2/17>> renal ultrasound: No hydronephrosis.   Significant microbiology data: 2/15>> COVID/influenza/RSV PCR: Negative   Procedures: None   Consults: PCCM  Brief Hospital Course: Acute on chronic hypoxic respiratory failure secondary to pulmonary fibrosis flare Improved-with decreasing FiO2 requirements Treated mostly with supportive care and IV steroids Given  Lasix to ensure negative balance with volume status. Down to 3 L of oxygen at rest, requiring around 4 L of oxygen with ambulation per PT note. PCCM followed closely-please ensure outpatient follow-up with PCCM.  PCCM recommends slow steroid taper as noted below.  Their office will call the patient for a follow-up appointment.    AKI on CKD 4 AKI likely hemodynamically mediated UA negative for proteinuria, renal ultrasound negative for hydronephrosis Creatinine gradually improving Avoid toxic agents Continue to trend creatinine daily.   Orthostatic hypotension Suspicion for orthostatic mechanism that led to a fall while at nephrologist clinic He ambulated without any major issues on 2/20. Allow some amount of permissive hypertension Continue Coreg/clonidine/Cardura/Lasix    COPD Not in exacerbation Continue bronchodilators   Chronic HFpEF Volume status stable Continue Lasix and maintain negative balance Continue Coreg Due to CKD/AKI-hold losartan until seen by outpatient nephrologist.   HTN BP fluctuating-with some readings borderline high Reasonable to allow some permissive hypertension due to suspicion of orthostatic hypotension leading to a fall on admission Continue Coreg/Lasix/clonidine and Cardura   HLD Continue statin   BPH Doxazosin   OSA Not using CPAP at home   DM-2 (A1c 5.6 on 02/20/2022) CBGs stable on SSI Resume Actos on discharge.   BMI: Estimated body mass index is 27.25 kg/m as calculated from the following:   Height as of this encounter: 5' 8"$  (1.727 m).   Weight as of this encounter: 81.3 kg.  Discharge Diagnoses:  Principal Problem:   Acute on chronic hypoxic respiratory failure (HCC) Active Problems:   Diabetes mellitus type 2 in nonobese (HCC)   Hyperlipidemia associated with type 2 diabetes mellitus ([place])   Hypertension associated with diabetes ([place])   COPD with emphysema, on O2   Sleep apnea   Pulmonary fibrosis (HCC)   (HFpEF)  heart failure with preserved ejection fraction [facility])   Discharge Instructions:  Activity:  As tolerated with Full fall precautions use walker/cane & assistance as needed  Discharge Instructions     Call MD for:  difficulty breathing, headache or visual disturbances   Complete by: As directed    Diet - low sodium heart healthy   Complete by: As directed    Diet Carb Modified   Complete by: As directed    Discharge instructions   Complete by: As directed    Follow with Primary MD  Colon Branch, MD in 1-2 weeks  Follow-up with your pulmonologist-the office will give you a call with a follow-up appointment.  Please get a complete blood count and chemistry panel checked by your Primary MD at your next visit, and again as instructed by your Primary MD.  Get Medicines reviewed and adjusted: Please take all your medications with you for your next visit with your Primary MD  Laboratory/radiological data: Please request your Primary MD to go over all hospital tests and procedure/radiological results at the follow up, please ask your Primary MD to get all Hospital records sent to his/her office.  In some cases, they will be blood work, cultures and biopsy results pending at the time of your discharge. Please request that your primary care M.D. follows up on these results.  Also Note the following: If you experience worsening of your admission symptoms, develop shortness of breath, life threatening emergency, suicidal or homicidal thoughts you must seek medical attention immediately by calling 911 or calling your MD immediately  if symptoms less severe.  You must read complete instructions/literature along with all the possible adverse reactions/side effects for all the Medicines you take and that have been prescribed to you. Take any new Medicines after you have completely understood and accpet all the possible adverse reactions/side effects.   Do not drive when taking Pain medications or  sleeping medications (Benzodaizepines)  Do not take more than prescribed Pain, Sleep and Anxiety Medications. It is not advisable to combine anxiety,sleep and pain medications without talking with your primary care practitioner  Special Instructions: If you have smoked or chewed Tobacco  in the last 2 yrs please stop smoking, stop any regular Alcohol  and or any Recreational drug use.  Wear Seat belts while driving.  Please note: You were cared for by a hospitalist during your hospital stay. Once you are discharged, your primary care physician will handle any further medical issues. Please note that NO REFILLS for any discharge medications will be authorized once you are discharged, as it is imperative that you return to your primary care physician (or establish a relationship with a primary care physician if you do not have one) for your post hospital discharge needs so that they can reassess your need for medications and monitor your lab values.   Increase activity slowly   Complete by: As directed       Allergies as of 05/28/2022   No Known Allergies      Medication List     STOP taking these medications    amLODipine 10 MG tablet Commonly  known as: NORVASC   losartan 50 MG tablet Commonly known as: COZAAR       TAKE these medications    albuterol 108 (90 Base) MCG/ACT inhaler Commonly known as: VENTOLIN HFA Inhale 2 puffs into the lungs every 4 (four) hours as needed for wheezing or shortness of breath.   atorvastatin 80 MG tablet Commonly known as: LIPITOR Take 1 tablet (80 mg total) by mouth daily.   carvedilol 12.5 MG tablet Commonly known as: COREG Take 12.5 mg by mouth in the morning and at bedtime.   cholecalciferol 25 MCG (1000 UNIT) tablet Commonly known as: VITAMIN D3 Take 1,000 Units by mouth daily.   cloNIDine 0.1 MG tablet Commonly known as: CATAPRES Take 1 tablet (0.1 mg total) by mouth 2 (two) times daily.   colchicine 0.6 MG tablet Take 1  tablet (0.6 mg total) by mouth 2 (two) times daily as needed. What changed: reasons to take this   doxazosin 4 MG tablet Commonly known as: CARDURA Take 1 tablet (4 mg total) by mouth at bedtime.   furosemide 40 MG tablet Commonly known as: LASIX Take 1 tablet (40 mg total) by mouth daily.   HYDROcodone-acetaminophen 5-325 MG tablet Commonly known as: NORCO/VICODIN Take 1 tablet by mouth every 6 (six) hours as needed for moderate pain.   ipratropium-albuterol 0.5-2.5 (3) MG/3ML Soln Commonly known as: DUONEB Take 3 mLs by nebulization 2 (two) times daily as needed (shortness of breath).   ketoconazole 2 % cream Commonly known as: NIZORAL Apply 1 application topically daily. What changed:  when to take this reasons to take this   MAGNESIUM PO Take 400 mg by mouth 2 (two) times daily.   OXYGEN Inhale 3.5-4 L into the lungs continuous.   pioglitazone 45 MG tablet Commonly known as: ACTOS Take 45 mg by mouth daily.   polyethylene glycol 17 g packet Commonly known as: MIRALAX / GLYCOLAX Take 17 g by mouth daily as needed for mild constipation.   predniSONE 10 MG tablet Commonly known as: DELTASONE 40 mg for 7 days, 20 mg for 7 days, 10 mg for 7 days   senna 8.6 MG Tabs tablet Commonly known as: SENOKOT Take 1 tablet (8.6 mg total) by mouth 2 (two) times daily. What changed:  when to take this reasons to take this   sodium chloride 0.65 % Soln nasal spray Commonly known as: OCEAN Place 1 spray into both nostrils as needed for congestion.               Durable Medical Equipment  (From admission, onward)           Start     Ordered   05/28/22 [phone]  For home use only DME oxygen  Once       Question Answer Comment  Length of Need Lifetime   Liters per Minute 5   Frequency Continuous (stationary and portable oxygen unit needed)   Oxygen conserving device Yes   Oxygen delivery system Gas      05/28/22 0636   05/27/22 1846  For home use only DME  Bedside commode  Once       Question:  Patient needs a bedside commode to treat with the following condition  Answer:  Respiratory failure ([place])   05/27/22 1846   05/27/22 1746  For home use only DME Walker rolling  Once       Question Answer Comment  Walker: With [place]   Patient needs a walker to  treat with the following condition Pulmonary edema   Patient needs a walker to treat with the following condition Respiratory failure ([place])      05/27/22 1747            Follow-up Information     Colon Branch, MD. Schedule an appointment as soon as possible for a visit in 1 week(s).   Specialty: Internal Medicine Contact information: [address] [place] [address] [phone]         Hunsucker, Bonna Gains, MD Follow up.   Specialty: Pulmonary Disease Why: Office will call with date/time, If you dont hear from them,please give them a call Contact information: [place] [address] [phone]         [facility] Follow up.   Why: 10L concentrator and portable Contact information: [place] [address] [phone]         [facility], [facility] Follow up.   Specialty: Home Health Services Why: Home Health Physical and Occupational Therapy will be provided call if you haven'r received a call in two days Contact information: [place] [address] [phone]                No Known Allergies   Other Procedures/Studies: DG Chest Port 1V same Day  Result Date: 05/27/2022 CLINICAL DATA:  Shortness of breath EXAM: PORTABLE CHEST 1 VIEW COMPARISON:  05/22/2022, 08/05/2020 FINDINGS: Stable cardiomegaly. Aortic atherosclerosis. Chronically coarsened interstitial markings bilaterally, most pronounced within the lung bases, left greater than right. Hyperlucent upper lung fields. No definite new airspace consolidation. No pleural effusion or  pneumothorax. IMPRESSION: Chronic findings of interstitial lung disease with pulmonary fibrosis. No definite superimposed airspace opacity. Electronically Signed   By: Davina Poke D.O.   On: 05/27/2022 10:42   US RENAL  Result Date: 05/24/2022 CLINICAL DATA:  Acute kidney injury. EXAM: RENAL / URINARY TRACT ULTRASOUND COMPLETE COMPARISON:  None Available. FINDINGS: Right Kidney: Renal measurements: 8.4 x 4.0 x 4.3 cm = volume: 74 mL. Normal parenchymal echogenicity. Mild diffuse cortical thinning. No mass, stone or hydronephrosis. Left Kidney: Renal measurements: 11.0 x 4.6 x 4.0 cm = volume: 102 mL. Echogenicity within normal limits. No mass or hydronephrosis visualized. Bladder: Bladder decompressed. Other: None. IMPRESSION: 1. No acute findings.  No hydronephrosis. 2. Mild reduction in the right renal size with right renal cortical thinning. No other abnormality. Electronically Signed   By: Lajean Manes M.D.   On: 05/24/2022 15:40   DG Chest Portable 1 View  Result Date: 05/22/2022 CLINICAL DATA:  Hypoxia. EXAM: PORTABLE CHEST 1 VIEW COMPARISON:  CT November 20, 2021 and radiograph Aug 05, 2020 FINDINGS: Cardiac enlargement. Aortic atherosclerosis. Central vascular prominence. Emphysematous lung changes with coarse bibasilar reticulations. Increased left-greater-than-right interstitial thickening. No visible pleural effusion or pneumothorax. No acute osseous abnormality. IMPRESSION: 1. Chronic parenchymal lung changes with increased left-greater-than-right basilar interstitial thickening, which could reflect mild interstitial edema. 2. Aortic Atherosclerosis (ICD10-I70.0) and Emphysema (ICD10-J43.9). Electronically Signed   By: Dahlia Bailiff M.D.   On: 05/22/2022 18:06     TODAY-DAY OF DISCHARGE:  Subjective:   Bradley Johns today has no headache,no chest abdominal pain,no new weakness tingling or numbness, feels much better wants to go home today.   Objective:   Blood pressure (!) 147/51,  pulse (!) 57, temperature (!) 97.2 F (36.2 C), temperature source Axillary, resp. rate 13, height 5' 8"$  (1.727 m),  weight 82.6 kg, SpO2 94 %.  Intake/Output Summary (Last 24 hours) at 05/28/2022 0828 Last data filed at 05/28/2022 0435 Gross per 24 hour  Intake 480 ml  Output 1900 ml  Net -1420 ml   Filed Weights   05/26/22 0500 05/27/22 0500 05/28/22 0438  Weight: 78.3 kg 81.3 kg 82.6 kg    Exam: Awake Alert, Oriented *3, No new F.N deficits, Normal affect [place].AT,PERRAL Supple Neck,No JVD, No cervical lymphadenopathy appriciated.  Symmetrical Chest wall movement, Good air movement bilaterally, CTAB RRR,No Gallops,Rubs or new Murmurs, No Parasternal Heave +ve B.Sounds, Abd Soft, Non tender, No organomegaly appriciated, No rebound -guarding or rigidity. No Cyanosis, Clubbing or edema, No new Rash or bruise   PERTINENT RADIOLOGIC STUDIES: DG Chest Port 1V same Day  Result Date: 05/27/2022 CLINICAL DATA:  Shortness of breath EXAM: PORTABLE CHEST 1 VIEW COMPARISON:  05/22/2022, 08/05/2020 FINDINGS: Stable cardiomegaly. Aortic atherosclerosis. Chronically coarsened interstitial markings bilaterally, most pronounced within the lung bases, left greater than right. Hyperlucent upper lung fields. No definite new airspace consolidation. No pleural effusion or pneumothorax. IMPRESSION: Chronic findings of interstitial lung disease with pulmonary fibrosis. No definite superimposed airspace opacity. Electronically Signed   By: Davina Poke D.O.   On: 05/27/2022 10:42     PERTINENT LAB RESULTS: CBC: No results for input(s): "WBC", "HGB", "HCT", "PLT" in the last 72 hours.  CMET CMP     Component Value Date/Time   NA 137 05/27/2022 0324   K 4.2 05/27/2022 0324   CL 103 05/27/2022 0324   CO2 27 05/27/2022 0324   GLUCOSE 174 (H) 05/27/2022 0324   GLUCOSE 176 07/12/2008 0000   BUN 54 (H) 05/27/2022 0324   CREATININE 2.04 (H) 05/27/2022 0324   CREATININE 1.36 (H) 12/21/2015 1430    CALCIUM 8.3 (L) 05/27/2022 0324   PROT 6.7 05/22/2022 1740   ALBUMIN 3.4 (L) 05/22/2022 1740   AST 28 05/22/2022 1740   ALT 24 05/22/2022 1740   ALKPHOS 75 05/22/2022 1740   BILITOT 0.7 05/22/2022 1740   GFRNONAA 31 (L) 05/27/2022 0324   GFRAA >60 05/25/2018 0310    GFR Estimated Creatinine Clearance: 27.7 mL/min (A) (by C-G formula based on SCr of 2.04 mg/dL (H)). No results for input(s): "LIPASE", "AMYLASE" in the last 72 hours. No results for input(s): "CKTOTAL", "CKMB", "CKMBINDEX", "TROPONINI" in the last 72 hours. Invalid input(s): "POCBNP" No results for input(s): "DDIMER" in the last 72 hours. No results for input(s): "HGBA1C" in the last 72 hours. No results for input(s): "CHOL", "HDL", "LDLCALC", "TRIG", "CHOLHDL", "LDLDIRECT" in the last 72 hours. No results for input(s): "TSH", "T4TOTAL", "T3FREE", "THYROIDAB" in the last 72 hours.  Invalid input(s): "FREET3" No results for input(s): "VITAMINB12", "FOLATE", "FERRITIN", "TIBC", "IRON", "RETICCTPCT" in the last 72 hours. Coags: No results for input(s): "INR" in the last 72 hours.  Invalid input(s): "PT" Microbiology: Recent Results (from the past 240 hour(s))  Resp panel by RT-PCR (RSV, Flu A&B, Covid) Anterior Nasal Swab     Status: None   Collection Time: 05/22/22  5:38 PM   Specimen: Anterior Nasal Swab  Result Value Ref Range Status   SARS Coronavirus 2 by RT PCR NEGATIVE NEGATIVE Final    Comment: (NOTE) SARS-CoV-2 target nucleic acids are NOT DETECTED.  The SARS-CoV-2 RNA is generally detectable in upper respiratory specimens during the acute phase of infection. The lowest concentration of SARS-CoV-2 viral copies this assay can detect is 138 copies/mL. A negative result does not preclude SARS-Cov-2 infection and should  not be used as the sole basis for treatment or other patient management decisions. A negative result may occur with  improper specimen collection/handling, submission of specimen other than  nasopharyngeal swab, presence of viral mutation(s) within the areas targeted by this assay, and inadequate number of viral copies(<138 copies/mL). A negative result must be combined with clinical observations, patient history, and epidemiological information. The expected result is Negative.  Fact Sheet for Patients:  EntrepreneurPulse.com.au  Fact Sheet for Healthcare Providers:  IncredibleEmployment.be  This test is no t yet approved or cleared by the [place] FDA and  has been authorized for detection and/or diagnosis of SARS-CoV-2 by FDA under an Emergency Use Authorization (EUA). This EUA will remain  in effect (meaning this test can be used) for the duration of the COVID-19 declaration under Section 564(b)(1) of the Act, 21 U.S.C.section 360bbb-3(b)(1), unless the authorization is terminated  or revoked sooner.       Influenza A by PCR NEGATIVE NEGATIVE Final   Influenza B by PCR NEGATIVE NEGATIVE Final    Comment: (NOTE) The Xpert Xpress SARS-CoV-2/FLU/RSV plus assay is intended as an aid in the diagnosis of influenza from Nasopharyngeal swab specimens and should not be used as a sole basis for treatment. Nasal washings and aspirates are unacceptable for Xpert Xpress SARS-CoV-2/FLU/RSV testing.  Fact Sheet for Patients: EntrepreneurPulse.com.au  Fact Sheet for Healthcare Providers: IncredibleEmployment.be  This test is not yet approved or cleared by the [place] FDA and has been authorized for detection and/or diagnosis of SARS-CoV-2 by FDA under an Emergency Use Authorization (EUA). This EUA will remain in effect (meaning this test can be used) for the duration of the COVID-19 declaration under Section 564(b)(1) of the Act, 21 U.S.C. section 360bbb-3(b)(1), unless the authorization is terminated or revoked.     Resp Syncytial Virus by PCR NEGATIVE NEGATIVE Final    Comment:  (NOTE) Fact Sheet for Patients: EntrepreneurPulse.com.au  Fact Sheet for Healthcare Providers: IncredibleEmployment.be  This test is not yet approved or cleared by the [place] FDA and has been authorized for detection and/or diagnosis of SARS-CoV-2 by FDA under an Emergency Use Authorization (EUA). This EUA will remain in effect (meaning this test can be used) for the duration of the COVID-19 declaration under Section 564(b)(1) of the Act, 21 U.S.C. section 360bbb-3(b)(1), unless the authorization is terminated or revoked.  Performed at [facility], [address] [place], [place] [address]     FURTHER DISCHARGE INSTRUCTIONS:  Get Medicines reviewed and adjusted: Please take all your medications with you for your next visit with your Primary MD  Laboratory/radiological data: Please request your Primary MD to go over all hospital tests and procedure/radiological results at the follow up, please ask your Primary MD to get all Hospital records sent to his/her office.  In some cases, they will be blood work, cultures and biopsy results pending at the time of your discharge. Please request that your primary care M.D. goes through all the records of your hospital data and follows up on these results.  Also Note the following: If you experience worsening of your admission symptoms, develop shortness of breath, life threatening emergency, suicidal or homicidal thoughts you must seek medical attention immediately by calling 911 or calling your MD immediately  if symptoms less severe.  You must read complete instructions/literature along with all the possible adverse reactions/side effects for all the Medicines you take and that have been prescribed to you. Take any new Medicines after you  have completely understood and accpet all the possible adverse reactions/side effects.   Do not drive when taking Pain medications or sleeping  medications (Benzodaizepines)  Do not take more than prescribed Pain, Sleep and Anxiety Medications. It is not advisable to combine anxiety,sleep and pain medications without talking with your primary care practitioner  Special Instructions: If you have smoked or chewed Tobacco  in the last 2 yrs please stop smoking, stop any regular Alcohol  and or any Recreational drug use.  Wear Seat belts while driving.  Please note: You were cared for by a hospitalist during your hospital stay. Once you are discharged, your primary care physician will handle any further medical issues. Please note that NO REFILLS for any discharge medications will be authorized once you are discharged, as it is imperative that you return to your primary care physician (or establish a relationship with a primary care physician if you do not have one) for your post hospital discharge needs so that they can reassess your need for medications and monitor your lab values.  Total Time spent coordinating discharge including counseling, education and face to face time equals greater than 30 minutes.  SignedOren Binet 05/28/2022 8:28 AM

## 2022-05-27 NOTE — Evaluation (Signed)
Physical Therapy Evaluation Patient Details Name: Bradley Johns MRN: [id number] DOB: 12/19/1934 Today's Date: 05/27/2022  History of Present Illness  Patient is a 88 y.o.  male with history of IPF COPD with chronic hypoxic respiratory failure on home O2 who presented to the ED after he fell at the nephrologist office.  He apparently started feeling lightheaded when he stood up from a seated position and subsequently fell.  He was thought to have orthostatic hypotension as the mechanism for the fall, he was also found to have AKI and acute on chronic hypoxic respiratory failure due to IPF flare requiring heated high flow oxygen.  He was subsequently admitted to hospitalist service-started on steroids and other supportive care and has gradually improved with decreasing FiO2 requirements.   Clinical Impression  Patient received sitting up on side of bed eating lunch. He is agreeable to PT assessment. Patient on 3 liters upon arrival, reports he uses 3-5 liters O2 at home. He does not drive at baseline, uses cane. Has long standing sob with activity. Patient is able to stand with supervision and cues, ambulated 20 feet x 2 with seated rest between bouts due so fatigue and sob. O2 sats in 80%s to low 90%s throughout session on 4 liters for mobility. He will continue to benefit from skilled PT to improve activity tolerance, safety and independence.        Recommendations for follow up therapy are one component of a multi-disciplinary discharge planning process, led by the attending physician.  Recommendations may be updated based on patient status, additional functional criteria and insurance authorization.  Follow Up Recommendations Home health PT      Assistance Recommended at Discharge Frequent or constant Supervision/Assistance  Patient can return home with the following  A little help with walking and/or transfers;A little help with bathing/dressing/bathroom;Assist for transportation;Help with  stairs or ramp for entrance    Equipment Recommendations Rolling walker (2 wheels)  Recommendations for Other Services       Functional Status Assessment Patient has had a recent decline in their functional status and demonstrates the ability to make significant improvements in function in a reasonable and predictable amount of time.     Precautions / Restrictions Precautions Precautions: Fall Restrictions Weight Bearing Restrictions: No      Mobility  Bed Mobility               General bed mobility comments: received sitting up on side of bed and remained there    Transfers Overall transfer level: Needs assistance Equipment used: Rolling walker (2 wheels) Transfers: Sit to/from Stand Sit to Stand: Supervision           General transfer comment: cues for hand placement, safety    Ambulation/Gait Ambulation/Gait assistance: Supervision Gait Distance (Feet): 40 Feet (20 x 2) Assistive device: Rolling walker (2 wheels) Gait Pattern/deviations: Step-through pattern, Decreased step length - right, Decreased step length - left, Decreased stride length Gait velocity: decr     General Gait Details: patient limited by respiratory status, SOB, Saturations. He reports no dizziness with mobility this session.  Stairs            Wheelchair Mobility    Modified Rankin (Stroke Patients Only)       Balance Overall balance assessment: Needs assistance, History of Falls Sitting-balance support: Feet supported Sitting balance-Leahy Scale: Normal     Standing balance support: Bilateral upper extremity supported, During functional activity, Reliant on assistive device for balance Standing balance-Leahy Scale: Fair  Pertinent Vitals/Pain Pain Assessment Pain Assessment: No/denies pain    Home Living Family/patient expects to be discharged to:: Private residence Living Arrangements: Spouse/significant  other Available Help at Discharge: Family;Available 24 hours/day Type of Home: House Home Access: Stairs to enter Entrance Stairs-Rails: Right Entrance Stairs-Number of Steps: 4 Alternate Level Stairs-Number of Steps: 13, having chair lift installed Home Layout: Bed/bath upstairs;Two level Home Equipment: Cane - single point      Prior Function Prior Level of Function : Independent/Modified Independent             Mobility Comments: patient reports he ambulates with cane ADLs Comments: mod I     Hand Dominance   Dominant Hand: Right    Extremity/Trunk Assessment   Upper Extremity Assessment Upper Extremity Assessment: Defer to OT evaluation    Lower Extremity Assessment Lower Extremity Assessment: Generalized weakness    Cervical / Trunk Assessment Cervical / Trunk Assessment: Normal  Communication   Communication: No difficulties  Cognition Arousal/Alertness: Awake/alert Behavior During Therapy: WFL for tasks assessed/performed Overall Cognitive Status: Within Functional Limits for tasks assessed                                          General Comments      Exercises     Assessment/Plan    PT Assessment Patient needs continued PT services  PT Problem List Decreased strength;Decreased activity tolerance;Decreased balance;Decreased mobility;Decreased knowledge of use of DME;Cardiopulmonary status limiting activity       PT Treatment Interventions DME instruction;Therapeutic exercise;Gait training;Balance training;Functional mobility training;Therapeutic activities;Stair training;Patient/family education    PT Goals (Current goals can be found in the Care Plan section)  Acute Rehab PT Goals Patient Stated Goal: to return home PT Goal Formulation: With patient Time For Goal Achievement: 06/05/22 Potential to Achieve Goals: Good    Frequency Min 3X/week     Co-evaluation               AM-PAC PT "6 Clicks" Mobility   Outcome Measure Help needed turning from your back to your side while in a flat bed without using bedrails?: A Little Help needed moving from lying on your back to sitting on the side of a flat bed without using bedrails?: A Little Help needed moving to and from a bed to a chair (including a wheelchair)?: A Little Help needed standing up from a chair using your arms (e.g., wheelchair or bedside chair)?: A Little Help needed to walk in hospital room?: A Little Help needed climbing 3-5 steps with a railing? : A Lot 6 Click Score: 17    End of Session Equipment Utilized During Treatment: Gait belt;Oxygen Activity Tolerance: Patient limited by fatigue Patient left: Other (comment);with call bell/phone within reach (sitting on side of bed) Nurse Communication: Mobility status PT Visit Diagnosis: Other abnormalities of gait and mobility (R26.89);Muscle weakness (generalized) (M62.81);Difficulty in walking, not elsewhere classified (R26.2)    Time: [id number] PT Time Calculation (min) (ACUTE ONLY): 28 min   Charges:   PT Evaluation $PT Eval Moderate Complexity: 1 Mod PT Treatments $Gait Training: 8-22 mins        Rene Sizelove, PT, GCS 05/27/22,1:46 PM

## 2022-05-27 NOTE — TOC Progression Note (Addendum)
Transition of Care [facility]) - Progression Note    Patient Details  Name: Bradley Johns MRN: [id number] Date of Birth: [birth year]  Transition of Care [facility]) CM/SW Contact  Levonne Lapping, RN Phone Number: 05/27/2022, 5:51 PM   Call Daughter [name] with a DC time and any updates  She is available to pick up Wednesday after 1:00   [phone]   Clinical Narrative:    Patient will DC to home with Wife. [name] will provide Home Health. (Choice) [employer] will deliver a RW and 3 in 1 BSC to room. Order for these is in [place]. [employer] will also be providing portable O2 and a large home concentrator (10 L)   CM has requested the O2 sats and O2 order via secure chat to Provider and RN . Per patient, Wife will transport home.   Expected Discharge Plan: [place] Barriers to Discharge: Continued Medical Work up  Expected Discharge Plan and Services   Discharge Planning Services: CM Consult Post Acute Care Choice: [place] arrangements for the past 2 months: [place] (House  is a 2 story house with a flight of stairs leading to bedroom.)                 DME Arranged: Gilford Rile rolling DME Agency: [employer] Date DME Agency Contacted: 05/27/22 Time DME Agency Contacted: [phone] Representative spoke with at DME Agency: [place]: PT, OT [place] Agency: [place] Date [place]: 05/27/22 Time [place]: 1407 Representative spoke with at [place]: [place] ([place]) Interventions SDOH Screenings   Food Insecurity: No Food Insecurity (08/09/2021)  Housing: Low Risk  (08/09/2021)  Transportation Needs: No Transportation Needs (08/09/2021)  Alcohol Screen: Low Risk  (08/09/2021)  Depression (PHQ2-9): Low Risk  (08/09/2021)  Financial Resource Strain: Low Risk  (08/09/2021)  Physical Activity: Inactive (08/09/2021)  Social Connections: Socially Integrated (08/09/2021)  Stress: No Stress Concern Present  (08/09/2021)  Tobacco Use: Medium Risk (05/22/2022)    Readmission Risk Interventions     No data to display

## 2022-05-28 ENCOUNTER — Other Ambulatory Visit (HOSPITAL_COMMUNITY): Payer: Self-pay

## 2022-05-28 ENCOUNTER — Telehealth: Payer: Self-pay | Admitting: Physician Assistant

## 2022-05-28 DIAGNOSIS — J439 Emphysema, unspecified: Secondary | ICD-10-CM | POA: Diagnosis not present

## 2022-05-28 DIAGNOSIS — I5031 Acute diastolic (congestive) heart failure: Secondary | ICD-10-CM | POA: Diagnosis not present

## 2022-05-28 DIAGNOSIS — J9621 Acute and chronic respiratory failure with hypoxia: Secondary | ICD-10-CM | POA: Diagnosis not present

## 2022-05-28 DIAGNOSIS — J8410 Pulmonary fibrosis, unspecified: Secondary | ICD-10-CM | POA: Diagnosis not present

## 2022-05-28 LAB — GLUCOSE, CAPILLARY
Glucose-Capillary: 122 mg/dL — ABNORMAL HIGH (ref 70–99)
Glucose-Capillary: 127 mg/dL — ABNORMAL HIGH (ref 70–99)

## 2022-05-28 MED ORDER — CLONIDINE HCL 0.1 MG PO TABS
0.1000 mg | ORAL_TABLET | Freq: Two times a day (BID) | ORAL | 1 refills | Status: AC
  Filled 2022-05-28: qty 60, 30d supply, fill #0

## 2022-05-28 MED ORDER — PREDNISONE 10 MG PO TABS
ORAL_TABLET | ORAL | 0 refills | Status: AC
  Filled 2022-05-28: qty 49, 21d supply, fill #0

## 2022-05-28 MED ORDER — FUROSEMIDE 40 MG PO TABS
40.0000 mg | ORAL_TABLET | Freq: Every day | ORAL | 1 refills | Status: AC
  Filled 2022-05-28: qty 30, 30d supply, fill #0

## 2022-05-28 NOTE — Care Management (Signed)
SpO2 on 3.5L/min sitting EOB, reading on ear, 93% SpO2 after gait on 4L/min, SpO2 dropped to 83% Pt requires seated rest break & ongoing 4L/min to increase to >/= 88% Pt left on 4L/min at end of session

## 2022-05-28 NOTE — Progress Notes (Addendum)
Physical Therapy Treatment Patient Details Name: Bradley Johns MRN: [id number] DOB: January 22, 1935 Today's Date: 05/28/2022   History of Present Illness Patient is a 88 y.o.  male with history of IPF COPD with chronic hypoxic respiratory failure on home O2 who presented to the ED after he fell at the nephrologist office.  He apparently started feeling lightheaded when he stood up from a seated position and subsequently fell.  He was thought to have orthostatic hypotension as the mechanism for the fall, he was also found to have AKI and acute on chronic hypoxic respiratory failure due to IPF flare requiring heated high flow oxygen.  He was subsequently admitted to hospitalist service-started on steroids and other supportive care and has gradually improved with decreasing FiO2 requirements.    PT Comments    Pt seen for PT tx with pt agreeable. Pt noted to have urine in bed/on gown 2/2 pure wick malfunction. Pt is able to complete bed mobility with mod I with hospital bed features. Pt assisted with changing into clean gown. Pt progresses to STS with supervision with cuing for hand placement, gait to door & back with RW & supervision. Overall session is limited by SpO2 with pt noting he was feeling "winded today". SpO2 initially checked on finger, with nurse & MD arriving & MD recommending going by pulse ox on ear. Pt with improved readings with use of ear.  SpO2 on 3.5L/min sitting EOB, reading on ear, 93% SpO2 after gait on 4L/min, SpO2 dropped to 83% Pt requires seated rest break & ongoing 4L/min to increase to >/= 88% Pt left on 4L/min at end of session Pt educated on pursed lip breathing throughout session.    Recommendations for follow up therapy are one component of a multi-disciplinary discharge planning process, led by the attending physician.  Recommendations may be updated based on patient status, additional functional criteria and insurance authorization.  Follow Up Recommendations  Home  health PT     Assistance Recommended at Discharge Frequent or constant Supervision/Assistance  Patient can return home with the following A little help with walking and/or transfers;A little help with bathing/dressing/bathroom;Assist for transportation;Help with stairs or ramp for entrance   Equipment Recommendations  Rolling walker (2 wheels)    Recommendations for Other Services       Precautions / Restrictions Precautions Precautions: Fall Precaution Comments: watch BP, O2 Restrictions Weight Bearing Restrictions: No     Mobility  Bed Mobility Overal bed mobility: Needs Assistance Bed Mobility: Supine to Sit, Sit to Supine     Supine to sit: Modified independent (Device/Increase time), HOB elevated          Transfers Overall transfer level: Needs assistance Equipment used: Rolling walker (2 wheels) Transfers: Sit to/from Stand Sit to Stand: Supervision           General transfer comment: Cuing for safe hand placement (push to standing, reach back before sitting) with RW    Ambulation/Gait Ambulation/Gait assistance: Supervision Gait Distance (Feet): 20 Feet Assistive device: Rolling walker (2 wheels) Gait Pattern/deviations: Decreased step length - right, Decreased step length - left, Decreased stride length Gait velocity: decreased         Stairs             Wheelchair Mobility    Modified Rankin (Stroke Patients Only)       Balance Overall balance assessment: Needs assistance, History of Falls Sitting-balance support: Feet supported Sitting balance-Leahy Scale: Normal     Standing balance support: During functional  activity, Reliant on assistive device for balance, Single extremity supported Standing balance-Leahy Scale: Fair                              Cognition Arousal/Alertness: Awake/alert Behavior During Therapy: WFL for tasks assessed/performed Overall Cognitive Status: Within Functional Limits for tasks  assessed                                 General Comments: Pleasant man, follows instructions throughout session        Exercises      General Comments        Pertinent Vitals/Pain Pain Assessment Pain Assessment: No/denies pain    Home Living                          Prior Function            PT Goals (current goals can now be found in the care plan section) Acute Rehab PT Goals Patient Stated Goal: to return home PT Goal Formulation: With patient Time For Goal Achievement: 06/05/22 Potential to Achieve Goals: Good Progress towards PT goals: Progressing toward goals    Frequency    Min 3X/week      PT Plan Current plan remains appropriate    Co-evaluation              AM-PAC PT "6 Clicks" Mobility   Outcome Measure  Help needed turning from your back to your side while in a flat bed without using bedrails?: None Help needed moving from lying on your back to sitting on the side of a flat bed without using bedrails?: A Little Help needed moving to and from a bed to a chair (including a wheelchair)?: A Little Help needed standing up from a chair using your arms (e.g., wheelchair or bedside chair)?: A Little Help needed to walk in hospital room?: A Little Help needed climbing 3-5 steps with a railing? : A Lot 6 Click Score: 18    End of Session Equipment Utilized During Treatment: Oxygen Activity Tolerance:  (limited by O2) Patient left: with bed alarm set;with call bell/phone within reach (sitting EOB) Nurse Communication: Mobility status (O2) PT Visit Diagnosis: Other abnormalities of gait and mobility (R26.89);Muscle weakness (generalized) (M62.81);Difficulty in walking, not elsewhere classified (R26.2)     Time: [id number] PT Time Calculation (min) (ACUTE ONLY): 30 min  Charges:  $Therapeutic Activity: 23-37 mins                    Bradley Johns, PT, DPT 05/28/22, 11:01 AM   Bradley Johns 05/28/2022, 10:59  AM

## 2022-05-28 NOTE — Progress Notes (Signed)
SATURATION QUALIFICATIONS: (This note is used to comply with regulatory documentation for home oxygen)  Patient Saturations on Room Air at Rest = 82%  Patient Saturations on Room Air while Ambulating = unable to ambulate without oxygen  Patient Saturations on 5 Liters of oxygen while Ambulating = 94%  Please briefly explain why patient needs home oxygen:

## 2022-05-28 NOTE — TOC Transition Note (Signed)
Transition of Care [facility]) - CM/SW Discharge Note   Patient Details  Name: Bradley Johns MRN: [id number] Date of Birth: [birth year]  [facility]) CM/SW Contact:  Levonne Lapping, RN Phone Number: 05/28/2022, 9:57 AM   Clinical Narrative:       Final next level of care: Home w Home Health Services Barriers to Discharge: Continued Medical Work up   Patient Goals and CMS Choice CMS Medicare.gov Compare Post Acute Care list provided to:: Patient Choice offered to / list presented to : Patient  Discharge Placement                         Discharge Plan and Services Additional resources added to the After Visit Summary for     Discharge Planning Services: CM Consult Post Acute Care Choice: Home Health          DME Arranged: Walker rolling DME Agency: [employer] Date DME Agency Contacted: 05/27/22 Time DME Agency Contacted: [phone] Representative spoke with at DME Agency: Erasmo Downer HH Arranged: PT, OT [place] Agency: [place] Date [place]: 05/27/22 Time [place]: [place] Representative spoke with at [place]: [place] ([place]) Interventions SDOH Screenings   Food Insecurity: No Food Insecurity (08/09/2021)  Housing: Low Risk  (08/09/2021)  Transportation Needs: No Transportation Needs (08/09/2021)  Alcohol Screen: Low Risk  (08/09/2021)  Depression (PHQ2-9): Low Risk  (08/09/2021)  Financial Resource Strain: Low Risk  (08/09/2021)  Physical Activity: Inactive (08/09/2021)  Social Connections: Socially Integrated (08/09/2021)  Stress: No Stress Concern Present (08/09/2021)  Tobacco Use: Medium Risk (05/22/2022)     Readmission Risk Interventions     No data to display

## 2022-05-28 NOTE — Progress Notes (Signed)
Pt has discharge orders but his transportation will not be able to p/u pt until after 1pm.

## 2022-05-29 ENCOUNTER — Telehealth: Payer: Self-pay | Admitting: Internal Medicine

## 2022-05-29 ENCOUNTER — Telehealth: Payer: Self-pay | Admitting: Pulmonary Disease

## 2022-05-29 DIAGNOSIS — I469 Cardiac arrest, cause unspecified: Secondary | ICD-10-CM | POA: Diagnosis not present

## 2022-05-29 DIAGNOSIS — R55 Syncope and collapse: Secondary | ICD-10-CM | POA: Diagnosis not present

## 2022-05-30 NOTE — Telephone Encounter (Signed)
Dr. Silas Flood just an fyi

## 2022-05-30 NOTE — Telephone Encounter (Signed)
Death certificate signed.  

## 2022-06-06 NOTE — Telephone Encounter (Signed)
Patient's daughter called to notify us that patient was discharged from hospital yesterday and he passed away this morning.

## 2022-06-06 NOTE — Telephone Encounter (Signed)
Scheduled Pt w Dr. Silas Flood in 2 weeks for 06/12/2022 at 9:30am

## 2022-06-06 NOTE — Telephone Encounter (Signed)
Ivy with [facility] calling to advise that all documents are in database and she just needed to notify Dr. Larose Kells so that he can sign them. Case # [id number]

## 2022-06-06 DEATH — deceased

## 2022-06-12 ENCOUNTER — Ambulatory Visit: Payer: Medicare HMO | Admitting: Pulmonary Disease

## 2022-06-16 ENCOUNTER — Encounter: Payer: Medicare HMO | Admitting: Internal Medicine

## 2023-02-04 ENCOUNTER — Other Ambulatory Visit: Payer: Self-pay

## 2023-09-09 NOTE — Progress Notes (Signed)
 error

## 2024-01-14 NOTE — Procedures (Signed)
 erroneous
